# Patient Record
Sex: Male | Born: 1940 | Race: White | Hispanic: No
Health system: Midwestern US, Academic
[De-identification: ages and names within clinical notes are randomized; demographics above are authoritative.]

---

## 2016-05-08 MED ORDER — CARVEDILOL 6.25 MG PO TAB
6.25 mg | ORAL_TABLET | Freq: Two times a day (BID) | ORAL | 3 refills | 90.00000 days | Status: DC
Start: 2016-05-08 — End: 2016-06-14

## 2016-06-14 MED ORDER — FENOFIBRATE MICRONIZED 134 MG PO CAP
ORAL_CAPSULE | Freq: Every day | 3 refills | 30.00000 days | Status: DC
Start: 2016-06-14 — End: 2016-11-20

## 2016-06-14 MED ORDER — CARVEDILOL 12.5 MG PO TAB
12.5 mg | ORAL_TABLET | Freq: Two times a day (BID) | ORAL | 3 refills | 90.00000 days | Status: DC
Start: 2016-06-14 — End: 2016-08-23

## 2016-07-05 ENCOUNTER — Encounter: Admit: 2016-07-05 | Discharge: 2016-07-05 | Payer: MEDICARE

## 2016-07-05 DIAGNOSIS — Z952 Presence of prosthetic heart valve: ICD-10-CM

## 2016-07-05 DIAGNOSIS — I350 Nonrheumatic aortic (valve) stenosis: ICD-10-CM

## 2016-07-18 ENCOUNTER — Encounter: Admit: 2016-07-18 | Discharge: 2016-07-18 | Payer: MEDICARE

## 2016-07-18 ENCOUNTER — Ambulatory Visit: Admit: 2016-07-18 | Discharge: 2016-07-19 | Payer: MEDICARE

## 2016-07-18 DIAGNOSIS — Z720 Tobacco use: ICD-10-CM

## 2016-07-18 DIAGNOSIS — I6529 Occlusion and stenosis of unspecified carotid artery: ICD-10-CM

## 2016-07-18 DIAGNOSIS — J45909 Unspecified asthma, uncomplicated: ICD-10-CM

## 2016-07-18 DIAGNOSIS — R54 Age-related physical debility: ICD-10-CM

## 2016-07-18 DIAGNOSIS — I5023 Acute on chronic systolic (congestive) heart failure: ICD-10-CM

## 2016-07-18 DIAGNOSIS — I10 Essential (primary) hypertension: ICD-10-CM

## 2016-07-18 DIAGNOSIS — I7090 Unspecified atherosclerosis: ICD-10-CM

## 2016-07-18 DIAGNOSIS — N183 Chronic kidney disease, stage 3 (moderate): ICD-10-CM

## 2016-07-18 DIAGNOSIS — Z8679 Personal history of other diseases of the circulatory system: ICD-10-CM

## 2016-07-18 DIAGNOSIS — E785 Hyperlipidemia, unspecified: ICD-10-CM

## 2016-07-18 DIAGNOSIS — M1990 Unspecified osteoarthritis, unspecified site: ICD-10-CM

## 2016-07-18 DIAGNOSIS — I350 Nonrheumatic aortic (valve) stenosis: ICD-10-CM

## 2016-07-18 DIAGNOSIS — E039 Hypothyroidism, unspecified: ICD-10-CM

## 2016-07-18 DIAGNOSIS — K635 Polyp of colon: ICD-10-CM

## 2016-07-18 DIAGNOSIS — Z952 Presence of prosthetic heart valve: ICD-10-CM

## 2016-07-18 DIAGNOSIS — I429 Cardiomyopathy, unspecified: ICD-10-CM

## 2016-07-18 DIAGNOSIS — I2510 Atherosclerotic heart disease of native coronary artery without angina pectoris: ICD-10-CM

## 2016-07-18 MED ORDER — SPIRONOLACTONE 25 MG PO TAB
25 mg | ORAL_TABLET | Freq: Every day | ORAL | 3 refills | 90.00000 days | Status: AC
Start: 2016-07-18 — End: 2016-11-20

## 2016-07-26 ENCOUNTER — Encounter: Admit: 2016-07-26 | Discharge: 2016-07-26 | Payer: MEDICARE

## 2016-07-26 DIAGNOSIS — I10 Essential (primary) hypertension: Principal | ICD-10-CM

## 2016-07-26 LAB — BASIC METABOLIC PANEL
Lab: 1.6 — ABNORMAL HIGH (ref 0.72–1.25)
Lab: 104 % (ref 3–12)
Lab: 138 MMOL/L (ref 21–30)
Lab: 14
Lab: 14 mL/min (ref >60)
Lab: 25 mL/min (ref >60)
Lab: 4.5 U/L (ref 7–56)
Lab: 9.5
Lab: 91

## 2016-08-10 ENCOUNTER — Encounter: Admit: 2016-08-10 | Discharge: 2016-08-10 | Payer: MEDICARE

## 2016-08-10 MED ORDER — HYDRALAZINE 100 MG PO TAB
100 mg | ORAL_TABLET | Freq: Three times a day (TID) | ORAL | 3 refills | Status: SS
Start: 2016-08-10 — End: 2016-12-12

## 2016-08-23 ENCOUNTER — Ambulatory Visit: Admit: 2016-08-23 | Discharge: 2016-08-24 | Payer: MEDICARE

## 2016-08-23 ENCOUNTER — Encounter: Admit: 2016-08-23 | Discharge: 2016-08-23 | Payer: MEDICARE

## 2016-08-23 ENCOUNTER — Ambulatory Visit: Admit: 2016-08-23 | Discharge: 2016-08-23 | Payer: MEDICARE

## 2016-08-23 DIAGNOSIS — I429 Cardiomyopathy, unspecified: ICD-10-CM

## 2016-08-23 DIAGNOSIS — Z952 Presence of prosthetic heart valve: ICD-10-CM

## 2016-08-23 DIAGNOSIS — Z720 Tobacco use: ICD-10-CM

## 2016-08-23 DIAGNOSIS — M1990 Unspecified osteoarthritis, unspecified site: ICD-10-CM

## 2016-08-23 DIAGNOSIS — I10 Essential (primary) hypertension: ICD-10-CM

## 2016-08-23 DIAGNOSIS — I7090 Unspecified atherosclerosis: ICD-10-CM

## 2016-08-23 DIAGNOSIS — Z8679 Personal history of other diseases of the circulatory system: ICD-10-CM

## 2016-08-23 DIAGNOSIS — I350 Nonrheumatic aortic (valve) stenosis: Principal | ICD-10-CM

## 2016-08-23 DIAGNOSIS — I5032 Chronic diastolic (congestive) heart failure: ICD-10-CM

## 2016-08-23 DIAGNOSIS — I2510 Atherosclerotic heart disease of native coronary artery without angina pectoris: ICD-10-CM

## 2016-08-23 DIAGNOSIS — R54 Age-related physical debility: ICD-10-CM

## 2016-08-23 DIAGNOSIS — K635 Polyp of colon: ICD-10-CM

## 2016-08-23 DIAGNOSIS — J45909 Unspecified asthma, uncomplicated: ICD-10-CM

## 2016-08-23 DIAGNOSIS — I5023 Acute on chronic systolic (congestive) heart failure: ICD-10-CM

## 2016-08-23 DIAGNOSIS — E039 Hypothyroidism, unspecified: ICD-10-CM

## 2016-08-23 DIAGNOSIS — I6529 Occlusion and stenosis of unspecified carotid artery: ICD-10-CM

## 2016-08-23 DIAGNOSIS — E785 Hyperlipidemia, unspecified: ICD-10-CM

## 2016-08-23 LAB — CBC
Lab: 12 g/dL — ABNORMAL LOW (ref 13.5–16.5)
Lab: 38 % — ABNORMAL LOW (ref 40–50)
Lab: 4.1 M/UL — ABNORMAL LOW (ref 4.4–5.5)
Lab: 9.4 K/UL (ref 4.5–11.0)

## 2016-08-23 LAB — BNP (B-TYPE NATRIURETIC PEPTI): Lab: 103 pg/mL — ABNORMAL HIGH (ref 0–100)

## 2016-08-23 LAB — BASIC METABOLIC PANEL
Lab: 134 MMOL/L — ABNORMAL LOW (ref 137–147)
Lab: 28 MMOL/L (ref 21–30)
Lab: 4.4 MMOL/L (ref 3.5–5.1)

## 2016-08-23 MED ORDER — CARVEDILOL 25 MG PO TAB
25 mg | ORAL_TABLET | Freq: Two times a day (BID) | ORAL | 3 refills | Status: SS
Start: 2016-08-23 — End: 2016-12-12

## 2016-08-23 NOTE — Progress Notes
Date of Service: 08/23/2016    Javier Gutierrez is a 76 y.o. male.       HPI       I had the pleasure of seeing Javier Gutierrez for 1 year post TAVR follow-up. He is a 76 year old with history of coronary artery disease, diastolic heart failure, hypertension, tobacco use, renal insufficiency, dyslipidemia and aortic stenosis.  He underwent workup for TAVR and on cardiac catheterization he was found to have diffuse nonobstructive coronary disease.  He was admitted in June 2017 and had a 29 Sapien valve placed.  He had no significant issues following the procedure.      He has since been following with Dr. Avie Arenas and Konrad Penta, NP and has had difficulty with blood pressure management.  Several of his medications have been changed.  He was taken off an ARB secondary to an increase in his creatinine.  He is currently on carvedilol 12.5 mg twice a day, hydralazine 100 mg 3 times a day and Spironolactone 25 mg daily.  He is tolerating this regimen without issues.  He states his blood pressure continues to be elevated however.  His blood pressure today in clinic is 180/90 but he did not have his mid day hydralazine.  He is taking all of his medications as prescribed.      Javier Gutierrez did have symptomatic improvement following TAVR.  He denies any chest pain, shortness of breath, palpitations, near-syncope or syncope.  He walks around his building on a daily basis.           Vitals:    08/23/16 1502   BP: 180/90   Pulse: 80   Weight: 66.7 kg (147 lb)   Height: 1.778 m (5' 10)     Body mass index is 21.09 kg/m???.     Past Medical History  Patient Active Problem List    Diagnosis Date Noted   ??? Localized edema 12/13/2015   ??? 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 Chronic diastolic (congestive) heart failure (HCC) 08/02/2015   ??? Stage 3 chronic kidney disease (HCC) 04/05/2015   ??? Loss of appetite 04/05/2015   ??? PAD (peripheral artery disease) (HCC) 04/05/2015   ??? Aortic stenosis 03/11/2015 ??? Weight loss 03/10/2015   ??? Frailty    ??? Tobacco abuse    ??? Acute on chronic systolic heart failure, NYHA class 2 (HCC)    ??? Palpable abdominal aorta 01/25/2015   ??? Systolic murmur of aorta 04/29/2014   ??? Weight loss, unintentional 05/01/2012   ??? 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 Tobacco use disorder 04/28/2010   ??? Bilateral carotid artery disease (HCC) 04/28/2010   ??? Cardiomyopathy (HCC) 11/29/2008     History of cardiomyopathy - left ventricular systolic function has normalized.     ??? History of renal artery stenosis 11/29/2008     A history of renal artery stenosis, status post PTA to left renal artery on 05/2001.     ??? 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 Hypertension 11/29/2008   ??? Hyperlipidemia 11/29/2008   ??? CAD (coronary artery disease) 11/29/2008     03/11/15: Cardiac cath ( via Rt radial approach), 30%pLAD, 40%mLAD, 50-60%mLcx, 30-40%pRCA, 30%mRCA followed by 30% distal  - aggressive medial therapy for CAD.     ??? H/O 11/29/2008         Review of Systems   Constitution: Positive for weight loss.   HENT: Negative.    Eyes:  Negative.    Cardiovascular: Negative.    Respiratory: Negative.    Endocrine: Negative.    Hematologic/Lymphatic: Negative.    Skin: Negative.    Musculoskeletal: Negative.    Gastrointestinal: Negative.    Genitourinary: Negative.    Neurological: Negative.    Psychiatric/Behavioral: Negative.    Allergic/Immunologic: Negative.    All other systems reviewed and are negative.      Physical Exam  General Appearance: no acute distress  Skin: warm & intact  HEENT: unremarkable  Neck Veins: neck veins are flat & not distended  Carotid Arteries: no bruits  Chest Inspection: chest is normal in appearance  Auscultation/Percussion: lungs clear to auscultation, no rales, rhonchi, or wheezing  Cardiac Rhythm: regular rhythm & normal rate  Cardiac Auscultation: Normal S1 & S2, no S3 or S4, no rub  Murmurs: no cardiac murmurs Extremities: no lower extremity edema; 2+ symmetric distal pulses  Abdominal Exam: soft, non-tender, no masses, bowel sounds normal  Liver & Spleen: no organomegaly  Neurologic Exam: oriented to time, place and person; no focal neurologic deficits  Psychiatric: Normal mood and affect.  Behavior is normal. Judgment and thought content normal.         Cardiovascular Studies  Preliminary EKG: NSR, rate 80 bpm.  Incomplete LBBB, LVH    Problems Addressed Today  Encounter Diagnoses   Name Primary?   ??? Nonrheumatic aortic valve stenosis Yes   ??? Essential hypertension    ??? Chronic diastolic (congestive) heart failure (HCC)    ??? S/P TAVR (transcatheter aortic valve replacement)    ??? Coronary artery disease involving native coronary artery of native heart without angina pectoris    ??? Tobacco abuse        Assessment and Plan       1.  Aortic stenosis status post TAVR in July 2017.  He had an echocardiogram today and on preliminary review the valve is well-seated with no regurgitation and a mean gradient of 8 mmHg.  He has had symptomatic improvement since the procedure.  He needs to continue aspirin indefinitely and SBE prophylaxis lifelong.  2.  Hypertension.  His blood pressure is elevated today.  I will increase carvedilol to 25 mg twice a day.  He should continue hydralazine 100 mg 3 times a day and Spironolactone 25 mg daily.  He should continue to check his pressure at home.  He has a follow-up with Dr. Avie Arenas next month.  3.  Chronic diastolic heart failure.  He has no evidence of volume overload currently.  He has NYHA class I heart failure symptoms.  4.  Tobacco use.  Cessation was encouraged.    He should continue to follow with Dr. Avie Arenas routinely and I would recommend an annual echocardiogram.  Thank you for allowing [place] to participate in the care of this pleasant individual.  If you have any other questions or concerns, please do not hesitate to contact us. Current Medications (including today's revisions)  ??? acetaminophen (TYLENOL) 325 mg tablet Take 2 Tabs by mouth every 6 hours as needed for Pain. (Patient taking differently: Take 650 mg by mouth as Needed for Pain.)   ??? aspirin EC 81 mg tablet Take 1 Tab by mouth daily. Take with food.   ??? carvedilol (COREG) 25 mg tablet Take 1 tablet by mouth twice daily. Take with food.   ??? cilostazol(+) (PLETAL) 50 mg tablet Take 50 mg by mouth twice daily. Take on an empty stomach at least 30 minutes before or 2  hours after food.   ??? dutasteride (AVODART) 0.5 mg PO capsule Take 0.5 mg by mouth Daily.   ??? fenofibrate micronized (LOFIBRA) 134 mg capsule TAKE ONE CAPSULE BY MOUTH ONCE DAILY BEFORE BREAKFAST   ??? fish oil- omega 3-DHA/EPA 300/1,000 mg capsule Take 1 Cap by mouth twice daily.   ??? hydrALAZINE (APRESOLINE) 100 mg tablet TAKE 1 TABLET BY MOUTH THREE TIMES DAILY   ??? levothyroxine (SYNTHROID) 100 mcg tablet Take 1 tablet by mouth daily.   ??? montelukast (SINGULAIR) 10 mg tablet TAKE ONE TABLET BY MOUTH ONCE DAILY (Patient taking differently: TAKE ONE TABLET BY MOUTH ONCE DAILY in Morning)   ??? simvastatin (ZOCOR) 80 mg tablet TAKE ONE TABLET BY MOUTH ONCE DAILY AT BEDTIME   ??? spironolactone (ALDACTONE) 25 mg tablet Take 1 tablet by mouth daily. Take with food.   ??? tadalafil (CIALIS) 20 mg tablet Take 20 mg by mouth as Needed for Erectile dysfunction.

## 2016-10-11 ENCOUNTER — Ambulatory Visit: Admit: 2016-10-11 | Discharge: 2016-10-12 | Payer: MEDICARE

## 2016-10-11 ENCOUNTER — Encounter: Admit: 2016-10-11 | Discharge: 2016-10-11 | Payer: MEDICARE

## 2016-10-11 DIAGNOSIS — I739 Peripheral vascular disease, unspecified: ICD-10-CM

## 2016-10-11 DIAGNOSIS — I10 Essential (primary) hypertension: ICD-10-CM

## 2016-10-11 DIAGNOSIS — M1990 Unspecified osteoarthritis, unspecified site: ICD-10-CM

## 2016-10-11 DIAGNOSIS — I6529 Occlusion and stenosis of unspecified carotid artery: ICD-10-CM

## 2016-10-11 DIAGNOSIS — E039 Hypothyroidism, unspecified: ICD-10-CM

## 2016-10-11 DIAGNOSIS — Z952 Presence of prosthetic heart valve: ICD-10-CM

## 2016-10-11 DIAGNOSIS — I350 Nonrheumatic aortic (valve) stenosis: ICD-10-CM

## 2016-10-11 DIAGNOSIS — I6523 Occlusion and stenosis of bilateral carotid arteries: ICD-10-CM

## 2016-10-11 DIAGNOSIS — R54 Age-related physical debility: ICD-10-CM

## 2016-10-11 DIAGNOSIS — I444 Left anterior fascicular block: ICD-10-CM

## 2016-10-11 DIAGNOSIS — I429 Cardiomyopathy, unspecified: ICD-10-CM

## 2016-10-11 DIAGNOSIS — Z8679 Personal history of other diseases of the circulatory system: ICD-10-CM

## 2016-10-11 DIAGNOSIS — K635 Polyp of colon: ICD-10-CM

## 2016-10-11 DIAGNOSIS — J45909 Unspecified asthma, uncomplicated: ICD-10-CM

## 2016-10-11 DIAGNOSIS — I2510 Atherosclerotic heart disease of native coronary artery without angina pectoris: Principal | ICD-10-CM

## 2016-10-11 DIAGNOSIS — I6310 Cerebral infarction due to embolism of unspecified precerebral artery: ICD-10-CM

## 2016-10-11 DIAGNOSIS — I5032 Chronic diastolic (congestive) heart failure: ICD-10-CM

## 2016-10-11 DIAGNOSIS — Z720 Tobacco use: ICD-10-CM

## 2016-10-11 DIAGNOSIS — E785 Hyperlipidemia, unspecified: ICD-10-CM

## 2016-10-11 DIAGNOSIS — R0989 Other specified symptoms and signs involving the circulatory and respiratory systems: ICD-10-CM

## 2016-10-11 DIAGNOSIS — N183 Chronic kidney disease, stage 3 (moderate): ICD-10-CM

## 2016-10-11 DIAGNOSIS — I7090 Unspecified atherosclerosis: ICD-10-CM

## 2016-10-11 DIAGNOSIS — I5023 Acute on chronic systolic (congestive) heart failure: ICD-10-CM

## 2016-10-11 DIAGNOSIS — E7800 Pure hypercholesterolemia, unspecified: ICD-10-CM

## 2016-10-14 ENCOUNTER — Encounter: Admit: 2016-10-14 | Discharge: 2016-10-14 | Payer: MEDICARE

## 2016-10-15 MED ORDER — SIMVASTATIN 80 MG PO TAB
ORAL_TABLET | Freq: Every day | 3 refills | Status: AC
Start: 2016-10-15 — End: 2016-11-20

## 2016-11-20 ENCOUNTER — Encounter: Admit: 2016-11-20 | Discharge: 2016-11-20 | Payer: MEDICARE

## 2016-11-20 ENCOUNTER — Inpatient Hospital Stay: Admit: 2016-11-20 | Discharge: 2016-11-20 | Payer: MEDICARE

## 2016-11-20 ENCOUNTER — Inpatient Hospital Stay: Admit: 2016-12-11 | Discharge: 2016-12-11 | Payer: MEDICARE

## 2016-11-20 DIAGNOSIS — R05 Cough: Secondary | ICD-10-CM

## 2016-11-20 DIAGNOSIS — E785 Hyperlipidemia, unspecified: ICD-10-CM

## 2016-11-20 DIAGNOSIS — Z720 Tobacco use: ICD-10-CM

## 2016-11-20 DIAGNOSIS — I6529 Occlusion and stenosis of unspecified carotid artery: ICD-10-CM

## 2016-11-20 DIAGNOSIS — R54 Age-related physical debility: ICD-10-CM

## 2016-11-20 DIAGNOSIS — Z8679 Personal history of other diseases of the circulatory system: ICD-10-CM

## 2016-11-20 DIAGNOSIS — I350 Nonrheumatic aortic (valve) stenosis: ICD-10-CM

## 2016-11-20 DIAGNOSIS — R918 Other nonspecific abnormal finding of lung field: ICD-10-CM

## 2016-11-20 DIAGNOSIS — J690 Pneumonitis due to inhalation of food and vomit: Secondary | ICD-10-CM

## 2016-11-20 DIAGNOSIS — K635 Polyp of colon: ICD-10-CM

## 2016-11-20 DIAGNOSIS — I5023 Acute on chronic systolic (congestive) heart failure: ICD-10-CM

## 2016-11-20 DIAGNOSIS — I7090 Unspecified atherosclerosis: ICD-10-CM

## 2016-11-20 DIAGNOSIS — J45909 Unspecified asthma, uncomplicated: ICD-10-CM

## 2016-11-20 DIAGNOSIS — E039 Hypothyroidism, unspecified: ICD-10-CM

## 2016-11-20 DIAGNOSIS — J189 Pneumonia, unspecified organism: ICD-10-CM

## 2016-11-20 DIAGNOSIS — I2510 Atherosclerotic heart disease of native coronary artery without angina pectoris: ICD-10-CM

## 2016-11-20 DIAGNOSIS — M1990 Unspecified osteoarthritis, unspecified site: ICD-10-CM

## 2016-11-20 DIAGNOSIS — I10 Essential (primary) hypertension: ICD-10-CM

## 2016-11-20 DIAGNOSIS — I429 Cardiomyopathy, unspecified: ICD-10-CM

## 2016-11-20 LAB — CBC AND DIFF
Lab: 0 10*3/uL (ref 0–0.20)
Lab: 0.1 10*3/uL (ref 0–0.45)
Lab: 18 10*3/uL — ABNORMAL HIGH (ref 4.5–11.0)
Lab: 0 % (ref 0–2)
Lab: 0 10*3/uL (ref 0–0.20)
Lab: 0.1 10*3/uL (ref 0–0.45)
Lab: 0.8 10*3/uL — ABNORMAL LOW (ref 1.0–4.8)
Lab: 1 % (ref 0–5)
Lab: 1.4 10*3/uL — ABNORMAL HIGH (ref 0–0.80)
Lab: 15 % — ABNORMAL HIGH (ref 11–15)
Lab: 15 10*3/uL — ABNORMAL HIGH (ref 1.8–7.0)
Lab: 17 10*3/uL — ABNORMAL HIGH (ref 4.5–11.0)
Lab: 26 % — ABNORMAL LOW (ref 40–50)
Lab: 29 pg (ref 26–34)
Lab: 33 g/dL (ref 32.0–36.0)
Lab: 4 % — ABNORMAL LOW (ref 24–44)
Lab: 523 10*3/uL — ABNORMAL HIGH (ref 150–400)
Lab: 6.6 FL — ABNORMAL LOW (ref 7–11)
Lab: 8 % (ref 4–12)
Lab: 8.8 g/dL — ABNORMAL LOW (ref 13.5–16.5)
Lab: 87 % — ABNORMAL HIGH (ref 41–77)
Lab: 89 FL (ref 80–100)

## 2016-11-20 LAB — COMPREHENSIVE METABOLIC PANEL
Lab: 130 MMOL/L — ABNORMAL LOW (ref 137–147)
Lab: 7 K/UL — ABNORMAL HIGH (ref 3–12)
Lab: >60 mL/min — ABNORMAL HIGH (ref >60)
Lab: >60 mL/min — ABNORMAL LOW (ref >60)

## 2016-11-20 LAB — PLEURAL FLUID GLUCOSE: Lab: 24 mg/dL — ABNORMAL LOW (ref 70–100)

## 2016-11-20 LAB — D-DIMER: Lab: 262 ng{FEU}/mL — ABNORMAL HIGH (ref <500)

## 2016-11-20 LAB — URINALYSIS DIPSTICK
Lab: NEGATIVE MMOL/L (ref 21–30)
Lab: NEGATIVE U/L (ref 7–40)
Lab: NEGATIVE g/dL — ABNORMAL LOW (ref 3.5–5.0)
Lab: NEGATIVE g/dL — ABNORMAL LOW (ref 6.0–8.0)
Lab: NEGATIVE mg/dL (ref 0.3–1.2)
Lab: NEGATIVE mg/dL — ABNORMAL LOW (ref 8.5–10.6)
Lab: POSITIVE U/L — AB (ref 7–56)

## 2016-11-20 LAB — PLEURAL FLUID LIPASE: Lab: 24 U/L

## 2016-11-20 LAB — PLEURAL FLUID PH: Lab: 7.2 — ABNORMAL LOW (ref 7.60–7.66)

## 2016-11-20 LAB — PLEURAL FLUID AMYLASE: Lab: 12 U/L

## 2016-11-20 LAB — POC TROPONIN: Lab: 0 ng/mL (ref 0.00–0.05)

## 2016-11-20 LAB — MAGNESIUM: Lab: 1.8 mg/dL — ABNORMAL HIGH (ref 1.6–2.6)

## 2016-11-20 LAB — URINALYSIS, MICROSCOPIC

## 2016-11-20 LAB — CREATINE KINASE-CPK: Lab: 15 U/L — ABNORMAL LOW (ref 35–232)

## 2016-11-20 LAB — BNP POC ER: Lab: 368 pg/mL — ABNORMAL HIGH (ref 0–100)

## 2016-11-20 LAB — PLEURAL FLUID LACTATE DEHYDROGENASE: Lab: 293 U/L — ABNORMAL HIGH (ref 67–140)

## 2016-11-20 LAB — POC LACTATE: Lab: 0.5 MMOL/L (ref 0.5–2.0)

## 2016-11-20 LAB — PLEURAL FLUID TOTAL BILIRUBIN: Lab: 0.4 mg/dL

## 2016-11-20 LAB — PLEURAL FLUID ALBUMIN

## 2016-11-20 LAB — PLEURAL FLUID TOTAL PROTEIN: Lab: 2.8 g/dL — ABNORMAL HIGH (ref <1.1)

## 2016-11-20 LAB — PHOSPHORUS: Lab: 2.3 mg/dL (ref 2.0–4.5)

## 2016-11-20 LAB — PLEURAL FLUID TRIGLYCERIDES: Lab: 33 mg/dL

## 2016-11-20 LAB — TSH WITH FREE T4 REFLEX: Lab: 0.8 uU/mL — ABNORMAL LOW (ref 0.35–5.00)

## 2016-11-20 LAB — PLEURAL FLUID CHOLESTEROL: Lab: 33 mg/dL (ref <45)

## 2016-11-20 MED ORDER — HYDRALAZINE 100 MG PO TAB
100 mg | Freq: Three times a day (TID) | ORAL | 0 refills | Status: DC
Start: 2016-11-20 — End: 2016-11-22
  Administered 2016-11-20 – 2016-11-22 (×5): 100 mg via ORAL

## 2016-11-20 MED ORDER — CARVEDILOL 25 MG PO TAB
25 mg | Freq: Two times a day (BID) | ORAL | 0 refills | Status: DC
Start: 2016-11-20 — End: 2016-11-22
  Administered 2016-11-20 – 2016-11-22 (×4): 25 mg via ORAL

## 2016-11-20 MED ORDER — LEVOTHYROXINE 100 MCG PO TAB
100 ug | Freq: Every day | ORAL | 0 refills | Status: DC
Start: 2016-11-20 — End: 2016-12-07
  Administered 2016-11-20 – 2016-12-07 (×14): 100 ug via ORAL

## 2016-11-20 MED ORDER — PIPERACILLIN-TAZOBACTAM-DEXTRS 4.5 GRAM/100 ML IV PGBK
4.5 g | INTRAVENOUS | 0 refills | Status: DC
Start: 2016-11-20 — End: 2016-11-20

## 2016-11-20 MED ORDER — MONTELUKAST 10 MG PO TAB
10 mg | Freq: Every day | ORAL | 0 refills | Status: DC
Start: 2016-11-20 — End: 2016-12-07
  Administered 2016-11-20 – 2016-12-07 (×14): 10 mg via ORAL

## 2016-11-20 MED ORDER — ALBUTEROL SULFATE 90 MCG/ACTUATION IN HFAA
2 | RESPIRATORY_TRACT | 0 refills | Status: DC | PRN
Start: 2016-11-20 — End: 2016-12-12

## 2016-11-20 MED ORDER — VANCOMYCIN PHARMACY TO MANAGE
1 | 0 refills | Status: DC
Start: 2016-11-20 — End: 2016-12-06

## 2016-11-20 MED ORDER — ENOXAPARIN 40 MG/0.4 ML SC SYRG
40 mg | Freq: Every day | SUBCUTANEOUS | 0 refills | Status: DC
Start: 2016-11-20 — End: 2016-12-08
  Administered 2016-11-21 – 2016-12-07 (×14): 40 mg via SUBCUTANEOUS

## 2016-11-20 MED ORDER — PIPERACILLIN-TAZOBACTAM-DEXTRS 4.5 GRAM/100 ML IV PGBK
4.5 g | INTRAVENOUS | 0 refills | Status: DC
Start: 2016-11-20 — End: 2016-12-07
  Administered 2016-11-20 – 2016-12-07 (×68): 4.5 g via INTRAVENOUS

## 2016-11-20 MED ORDER — PIPERACILLIN-TAZOBACTAM-DEXTRS 3.375 GRAM/50 ML IV PGBK
3.375 g | Freq: Once | INTRAVENOUS | 0 refills | Status: CP
Start: 2016-11-20 — End: ?
  Administered 2016-11-20: 12:00:00 3.375 g via INTRAVENOUS

## 2016-11-20 MED ORDER — ASPIRIN 81 MG PO TBEC
81 mg | Freq: Every day | ORAL | 0 refills | Status: DC
Start: 2016-11-20 — End: 2016-12-07
  Administered 2016-11-20 – 2016-12-05 (×14): 81 mg via ORAL

## 2016-11-20 MED ORDER — DUTASTERIDE 0.5 MG PO CAP
0.5 mg | Freq: Every day | ORAL | 0 refills | Status: DC
Start: 2016-11-20 — End: 2016-12-07
  Administered 2016-11-20 – 2016-12-05 (×14): 0.5 mg via ORAL

## 2016-11-20 MED ORDER — VANCOMYCIN 1G/250ML D5W IVPB (VIAL2BAG)
15 mg/kg | Freq: Once | INTRAVENOUS | 0 refills | Status: CP
Start: 2016-11-20 — End: ?
  Administered 2016-11-20 (×2): 1000 mg via INTRAVENOUS

## 2016-11-20 MED ORDER — VANCOMYCIN IN DEXTROSE 5 % 750 MG/150 ML IV PGBK
750 mg | Freq: Two times a day (BID) | INTRAVENOUS | 0 refills | Status: DC
Start: 2016-11-20 — End: 2016-11-23
  Administered 2016-11-20 – 2016-11-23 (×6): 750 mg via INTRAVENOUS

## 2016-11-20 NOTE — Patient Education
Medication Education    Javier Gutierrez accepted counseling and was receptive.  he verbalized understanding.    The following medications were discussed:  Zosyn  Vancomycin  Asprin  Coreg  Dutasteride  Singulair  Synthroid  Hydralazine      Where indicated, the patient was provided with additional medication and/or disease-state information.  All patient questions were answered and patient acknowledged understanding of the medications, side effects and other pertinent medication information.    Follow up should occur daily.    Continue to address: indications    Gwynn Burly, RN

## 2016-11-20 NOTE — ED Notes
Urine specimen sent to lab

## 2016-11-20 NOTE — Progress Notes
.  Patient arrived to room # (6202*) via bed accompanied by transport. Patient transferred to the bed with assistance. Bedside safety checks completed. Initial patient assessment completed, refer to flowsheet for details. Admission skin assessment completed by:    RN & Lequita Halt RN    Pressure Injury Present on Hospital Admission (within 24 hours): Yes    1. Occiput: No  2. Ear: No  3. Scapula: No  4. Spinous Process: No  5. Shoulder: No  6. Elbow: No  7. Iliac Crest: No  8. Sacrum/Coccyx: Yes  9. Ischial Tuberosity: No  10. Trochanter: No  11. Knee: No  12. Malleolus: No  13. Heel: No  14. Toes: No  15. Assessed for device associated injury Yes  16. Nursing Nutrition Assessment Completed Yes    See Doc Flowsheet for additional wound details.     INTERVENTIONS:

## 2016-11-20 NOTE — Other
Procedure Note    Javier Gutierrez is a 76 y.o. male.      Chest Tube  Date/Time: 11/20/2016 4:52 PM  Performed by: Felipa Eth  Authorized by: Myrtie Soman   Consent: Verbal consent obtained. Written consent obtained.  Risks and benefits: risks, benefits and alternatives were discussed  Consent given by: patient and power of attorney  Patient understanding: patient states understanding of the procedure being performed  Patient consent: the patient's understanding of the procedure matches consent given  Procedure consent: procedure consent matches procedure scheduled  Relevant documents: relevant documents present and verified  Test results: test results available and properly labeled  Site marked: the operative site was marked  Imaging studies: imaging studies available  Required items: required blood products, implants, devices, and special equipment available  Patient identity confirmed: verbally with patient  Time out: Immediately prior to procedure a "time out" was called to verify the correct patient, procedure, equipment, support staff and site/side marked as required.  Indications: pleural effusion    Sedation:  Patient sedated: no  Anesthesia: local infiltration    Anesthesia:  Local Anesthetic: lidocaine 1% without epinephrine  Anesthetic total: 10 mL  Preparation: skin prepped with Chloraprep  Placement location: left lateral  Scalpel size: 11  Tube size: 16 French  Ultrasound guidance: yes  Tension pneumothorax heard: no  Tube connected to: water seal  Drainage characteristics: cloudy  Drainage amount: 200 ml  Suture material: 2-0 silk  Dressing: 4x4 sterile gauze  Post-insertion x-ray comments: ordered  Patient tolerance: Patient tolerated the procedure well with no immediate complications               L. Alfonso Ellis

## 2016-11-20 NOTE — Case Management (ED)
Case Management Admission Assessment    NAME:Javier Gutierrez                          MRN: [id number]             DOB:Jun 21, 1940          AGE: 76 y.o.  ADMISSION DATE: 11/20/2016             DAYS ADMITTED: LOS: 0 days      Today???s Date: 11/20/2016    Source of Information: patient and his daughter       Plan  Plan: CM Assessment, Assist PRN with SW/NCM Services    Spoke with patient at the bedside, introduced self and explained the role of NCM    Unsure of patient needs at discharge at this time. Primary case management team to continue to follow up with the patient as further needs arise during hospitalization.    Patient Address/Phone  [address]  [address]  [place] [address]  [phone] (home)     Emergency Contact  Extended Emergency Contact Information  Primary Emergency Contact: Harada,[name]  Address: [address]           [place], [place] [address] [employer]  Home Phone: [phone]  Mobile Phone: [phone]  Relation: Daughter  Secondary Emergency Contact: Alben Sp[place]  Home Phone: [phone]  Mobile Phone: [phone]  Relation: Son    Network engineer Directive: Yes, patient has a healthcare directive  Type of Healthcare Directive: Durable power of attorney for healthcare  Location of Healthcare Directive: Current and verified in document scanning system  Would patient like to fill out a (a new) Editor, commissioning?: No, patient declined      Transportation  Does the patient need discharge transport arranged?: No  Transportation Name, Phone and Availability #1: [name] Robit[place]--daughter--[phone]  Does the patient use Medicaid Transportation?: No    Expected Discharge Date       Living Situation Prior to Admission  ? Living Arrangements  Type of Residence: Independent living facility  Living Arrangements: Alone  Financial risk analyst / Tub: Tub/Shower Unit  How many levels in the residence?: 1  Can patient live on one level if needed?: Yes Does residence have entry and/or side stairs?: No  Assistance needed prior to admit or anticipated on discharge: No  Who provides assistance or could if needed?: [name]--daughter  Are they in good health?: Yes  Can support system provide 24/7 care if needed?: No  ? Level of Function   Prior level of function: Independent  ? Cognitive Abilities   Cognitive Abilities: Alert and Oriented    Financial Resources  ? Coverage  Primary Insurance: Medicare  Additional Coverage: RX (Patient states he has [employer] for drug coverage. No concerns with medication costs at this time.)      Patient's preferred pharmacy is the [facility] in [place], [place].    ? Source of Income   Source Of Income: Other retirement income  ? Financial Assistance Needed?  No    Psychosocial Needs  ? Mental Health  Mental Health History: No  ? Substance Use History  Substance Use History Screen: Yes  Comment: Patient indicates that he smokes 12-13 cigarettes per day. Patient denies drinking alcohol  ? Other  N/A    Current/Previous Services  ? PCP  Steva Ready, [phone], [phone]  ? Pharmacy    [facility] [address] [place] - [phone]  [place] [address] [place] [address]  [place] [address]  Phone: [phone] Fax: [phone]    ? Durable Medical Equipment   Durable Medical Equipment at home: [employer], Grab bars, Toilet riser  ? Home Health  Receiving home health: No  ? Hemodialysis or Peritoneal Dialysis  Undergoing hemodialysis or peritoneal dialysis: No  ? Tube/Enteral Feeds  Receive tube/enteral feeds: No  ? Infusion  Receive infusions: No  ? Private Duty  Private duty help used: No  ? Home and Community Based Services  Home and community based services: No  ? [name]  [name]: No  ? Hospice  Hospice: No  ? Outpatient Therapy  PT: In the past  When did patient receive care?: July 2017--cardiac rehab  Name of rehab location/group: [facility]  OT: No  SLP: No  ? Skilled Nursing Facility/Nursing Home  SNF: No  NH: No ? Inpatient Rehab  IPR: No  ? Long-Term Acute Care Hospital  LTACH: No  ? Acute Hospital Stay  Acute Hospital Stay: In the past  Was patient's stay within the last 30 days?: No  When did patient receive care?: July 2017  Name of hospital: [facility] [place], [place]  Pager: [phone]  Office: [phone]

## 2016-11-20 NOTE — H&P (View-Only)
Admission History and Physical Examination      Name:  Javier Gutierrez                                             MRN:  [id number]   Admission Date:  11/20/2016                     Assessment/Plan:    Active Problems:    Pneumonia      76 year old man with history of hypertension, hyperlipidemia, coronary artery disease, aortic stenosis, chronic kidney disease, peripheral artery disease, and lifetime smoker who presents with concern for weakness and cough.  Per daughter he is reported to have a fall one week ago where he was down for 15 hours and generalized worsening fatigue over the last few weeks.    Suspected post obstructive pneumonia  -Reported coughing and weakness over the last few weeks and weight loss over the last year  -CT Chest showed    Consolidation of the majority of the left mid and lower lung, most   compatible with pneumonia and partially loculated pleural effusion.   Underlying neoplastic endobronchial lesion possible, recommend follow-up   CT or PET/CT after appropriate medical therapy. ???Secretions within the   trachea, suspicious for aspiration.    2. Moderate sized partially loculated loculated left pleural effusion with   areas of probable pleural thickening, may be inflammatory though   metastatic pleural disease could appear similar    3. Mild mediastinal and probable hilar adenopathy.     4. Calcific coronary artery disease.    CAD  -Continue PTA Aspirin/COreg    CT head negative and CT C spine was suspicious for possible lytic lesions to the c spine  Plan  >Consult pulm for possible bronch and further evaluation for suspicious mass   >Continue Vanc and Zosyn started in the ED  >Blood cultures sent will follow-up     Recent Mechanical fall  -CT head negative  >Consult PT/OT for eval      HLD/HTN  -He is of his simvastatin and fenofibrate due to reported muscle weakness since last Thursday  -Continue PTA coreg and hydralazine    Hypothyroidism -Check TSH/Free T$  -Continue PTA synthroid    Aortic Valve stenosis S/P TAVR in 2017    Chronic Tobacco abuse  -Has not smoked over the past week since he has felt week.  -Counseled and discussed smoking cessation with family    FEN:  NPO for now in case pulm plan to do bronchoscopy today  -Monitor and replace lytes  -Consult nutrition due to overall poor intake over the last year    DVT ppx: Lovenox, SCD's  DNAR-FI discussed with patietn  Admit to inpatient      __________________________________________________________________________________  Primary Care Physician: Steva Ready  Verified    Chief Complaint:  Fatigue, dyspnea, cough and recent fall  History of Present Illness: Javier Gutierrez is a 76 y.o. male with history of hypertension, hyperlipidemia, coronary artery disease, aortic stenosis, chronic kidney disease, peripheral artery disease, and lifetime smoker who presents with concern for weakness and cough.  Per daughter he is reported to have a fall one week ago where he was down for 15 hours and generalized worsening fatigue over the last few weeks. History primarily gathered from daughter because patient had a difficult time recalling medical history.  He has been living in an independent senior living facility and able to do all ADL without issues until a few weeks ago during which time has had been experiencing increasing fatigue and dyspnea.  He denies fevers, chills, nausea, emesis or changes of bowel and bladder.    Past Medical History:   Diagnosis Date   ??? Acute on chronic systolic heart failure, NYHA class 2 (HCC)    ??? Aortic valve stenosis, mild 11/29/2008   ??? Arterial occlusion     left kidney-two stents placed   ??? Arthritis     lower back   ??? Asthma    ??? CAD (coronary artery disease) 11/29/2008   ??? Cardiomyopathy (HCC) 11/29/2008   ??? Carotid artery plaque    ??? Colon polyps    ??? Frailty    ??? 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 HTN ??? Hyperlipidemia    ??? Hyperlipidemia 11/29/2008   ??? Hypertension 11/29/2008   ??? Hypothyroidism    ??? Tobacco abuse      Past Surgical History:   Procedure Laterality Date   ??? HX LUMBAR DISKECTOMY  1968    30 years ago   ??? STENT INTRAVASCULAR  2006    kidney stent placed   ??? PERCUTANEOUS CORONARY INTERVENTION N/A 03/11/2015    Possible Percutaneous Coronary Intervention performed by Marcell Barlow, MD, [facility] at CATH LAB   ??? UPPER GASTROINTESTINAL ENDOSCOPY N/A 04/26/2015    ESOPHAGOGASTRODUODENOSCOPY performed by Tempie Hoist, DO at ENDO/GI   ??? UPPER GASTROINTESTINAL ENDOSCOPY  04/26/2015    ESOPHAGOGASTRODUODENOSCOPY BIOPSY performed by Tempie Hoist, DO at ENDO/GI   ??? AORTIC VALVE REPLACEMENT N/A 08/03/2015    REPLACEMENT TRANSCATHETER AORTIC VALVE (Sapien 26s3), right common femoral artery approach performed by Zella Richer, MD at [facility]     Family history reviewed; non-contributory  Social History     Social History   ??? Marital status: Widowed     Spouse name: N/A   ??? Number of children: N/A   ??? Years of education: N/A     Social History Main Topics   ??? Smoking status: Current Every Day Smoker     Packs/day: 0.50     Years: 60.00     Types: Cigarettes   ??? Smokeless tobacco: Never Used      Comment: 0.5-2 ppd history   ??? Alcohol use No   ??? Drug use: No   ??? Sexual activity: Not on file     Other Topics Concern   ??? Not on file     Social History Narrative   ??? No narrative on file      Immunizations (includes history and patient reported):   There is no immunization history on file for this patient.        Allergies:  Sulfa (sulfonamide antibiotics)    Medications:  No current facility-administered medications for this encounter.      Current Outpatient Prescriptions   Medication Sig   ??? acetaminophen (TYLENOL) 325 mg tablet Take 2 Tabs by mouth every 6 hours as needed for Pain. (Patient taking differently: Take 650 mg by mouth as Needed for Pain.) ??? aspirin EC 81 mg tablet Take 1 Tab by mouth daily. Take with food.   ??? carvedilol (COREG) 25 mg tablet Take 1 tablet by mouth twice daily. Take with food.   ??? cilostazol(+) (PLETAL) 50 mg tablet Take 50 mg by mouth twice daily.  Take on an empty stomach at least 30 minutes before or 2 hours after food.   ??? dutasteride (AVODART) 0.5 mg PO capsule Take 0.5 mg by mouth Daily.   ??? fenofibrate micronized (LOFIBRA) 134 mg capsule TAKE ONE CAPSULE BY MOUTH ONCE DAILY BEFORE BREAKFAST   ??? fish oil- omega 3-DHA/EPA 300/1,000 mg capsule Take 1 Cap by mouth twice daily.   ??? hydrALAZINE (APRESOLINE) 100 mg tablet TAKE 1 TABLET BY MOUTH THREE TIMES DAILY   ??? levothyroxine (SYNTHROID) 100 mcg tablet Take 1 tablet by mouth daily.   ??? montelukast (SINGULAIR) 10 mg tablet TAKE ONE TABLET BY MOUTH ONCE DAILY (Patient taking differently: TAKE ONE TABLET BY MOUTH ONCE DAILY in Morning)   ??? simvastatin (ZOCOR) 80 mg tablet TAKE 1 TABLET BY MOUTH ONCE DAILY AT BEDTIME   ??? spironolactone (ALDACTONE) 25 mg tablet Take 1 tablet by mouth daily. Take with food.   ??? tadalafil (CIALIS) 20 mg tablet Take 20 mg by mouth as Needed for Erectile dysfunction.     Review of Systems:  Rest of 14 ROS is negative other than what is stated in HPI    Physical Exam:  Vital Signs: Last Filed In 24 Hours Vital Signs: 24 Hour Range   BP: 119/56 (10/30 0800)  Temp: 37.1 ???C (98.8 ???F) (10/30 0518)  Pulse: 75 (10/30 0800)  Respirations: 18 PER MINUTE (10/30 0800)  SpO2: 95 % (10/30 0730)  O2 Delivery: None (Room Air) (10/30 0518)  SpO2 Pulse: 76 (10/30 0800)  Height: 177.8 cm (70) (10/30 0518) BP: (119-146)/(55-66)   Temp:  [37.1 ???C (98.8 ???F)]   Pulse:  [74-84]   Respirations:  [15 PER MINUTE-22 PER MINUTE]   SpO2:  [91 %-95 %]   O2 Delivery: None (Room Air)          General:  Alert, cooperative, no distress, appears stated age  Head:  Normocephalic, without obvious abnormality, atraumatic  Eyes:  Conjunctivae/corneas clear.  PERRL, EOMs intact.  Fundi benign Throat:  Lips, mucosa and tongue normal.  Edentulous with dentures in place  Neck:  Supple, symmetrical, trachea midline, no adenopathy, thyroid: no enlargement/tenderness/nodules, no carotid bruit and no JVD  Lungs:  Scattered Rhonchi  Heart:    Regular rate and rhythm, S1, S2 normal, no murmur, click rub or gallop  Abdomen:  Soft, non-tender.  Bowel sounds normal.  No masses.  No organomegaly.  Extremities: + 1 pitting edema in B/L lower Extremities   Peripheral pulses:   2+ and symmetric, all extremities      Lab/Radiology/Other Diagnostic Tests:  24-hour labs:    Results for orders placed or performed during the hospital encounter of 11/20/16 (from the past 24 hour(s))   CBC AND DIFF    Collection Time: 11/20/16  5:22 AM   Result Value Ref Range    White Blood Cells 18.4 (H) 4.5 - 11.0 K/UL    RBC 3.11 (L) 4.4 - 5.5 M/UL    Hemoglobin 9.4 (L) 13.5 - 16.5 GM/DL    Hematocrit 13.0 (L) 40 - 50 %    MCV 88.4 80 - 100 FL    MCH 30.1 26 - 34 PG    MCHC 34.1 32.0 - 36.0 G/DL    RDW 86.5 (H) 11 - 15 %    Platelet Count 554 (H) 150 - 400 K/UL    MPV 7.0 7 - 11 FL    Neutrophils 85 (H) 41 - 77 %    Lymphocytes 5 (L) 24 -  44 %    Monocytes 9 4 - 12 %    Eosinophils 1 0 - 5 %    Basophils 0 0 - 2 %    Absolute Neutrophil Count 15.60 (H) 1.8 - 7.0 K/UL    Absolute Lymph Count 0.90 (L) 1.0 - 4.8 K/UL    Absolute Monocyte Count 1.70 (H) 0 - 0.80 K/UL    Absolute Eosinophil Count 0.10 0 - 0.45 K/UL    Absolute Basophil Count 0.00 0 - 0.20 K/UL   COMPREHENSIVE METABOLIC PANEL    Collection Time: 11/20/16  5:22 AM   Result Value Ref Range    Sodium 130 (L) 137 - 147 MMOL/L    Potassium 4.2 3.5 - 5.1 MMOL/L    Chloride 101 98 - 110 MMOL/L    Glucose 101 (H) 70 - 100 MG/DL    Blood Urea Nitrogen 25 7 - 25 MG/DL    Creatinine 1.61 0.4 - 1.24 MG/DL    Calcium 8.1 (L) 8.5 - 10.6 MG/DL    Total Protein 5.4 (L) 6.0 - 8.0 G/DL    Total Bilirubin 0.5 0.3 - 1.2 MG/DL    Albumin 2.2 (L) 3.5 - 5.0 G/DL    Alk Phosphatase 42 25 - 110 U/L AST (SGOT) 19 7 - 40 U/L    CO2 22 21 - 30 MMOL/L    ALT (SGPT) 9 7 - 56 U/L    Anion Gap 7 3 - 12    eGFR Non African American >60 >60 mL/min    eGFR African American >60 >60 mL/min   MAGNESIUM    Collection Time: 11/20/16  5:22 AM   Result Value Ref Range    Magnesium 1.8 1.6 - 2.6 mg/dL   PHOSPHORUS    Collection Time: 11/20/16  5:22 AM   Result Value Ref Range    Phosphorus 2.3 2.0 - 4.5 MG/DL   CREATINE KINASE-CPK    Collection Time: 11/20/16  5:22 AM   Result Value Ref Range    Creatine Kinase 15 (L) 35 - 232 U/L   D-DIMER    Collection Time: 11/20/16  5:22 AM   Result Value Ref Range    D-Dimer 2,623 (H) <500 ng/mL FEU   POC TROPONIN    Collection Time: 11/20/16  5:39 AM   Result Value Ref Range    Troponin-I-POC 0.00 0.00 - 0.05 NG/ML   BNP POC ER    Collection Time: 11/20/16  5:40 AM   Result Value Ref Range    BNP POC 368.0 (H) 0 - 100 PG/ML   CULTURE-BLOOD W/SENSITIVITY    Collection Time: 11/20/16  6:20 AM   Result Value Ref Range    Battery Name BLOOD CULTURE     Specimen Description BLOOD  RIGHT  WRIST       Special Requests NONE     Culture      Report Status     POC LACTATE    Collection Time: 11/20/16  6:37 AM   Result Value Ref Range    LACTIC ACID POC 0.5 0.5 - 2.0 MMOL/L     Glucose: (!) 101 (11/20/16 0522)  Pertinent radiology reviewed.    Iley Deignan, DO  Pager [phone]

## 2016-11-20 NOTE — ED Notes
1107: [id number] READY. Please call Morrie Sheldon @ [phone] for report.

## 2016-11-20 NOTE — ED Notes
[id number] CLEANING. Please call Morrie Sheldon @ [phone] for report.

## 2016-11-20 NOTE — Progress Notes
Pharmacy Vancomycin Note  Subjective:   Javier Gutierrez is a 76 y.o. male being treated for post-obstructive pneumonia.    Objective:     Current Vancomycin Orders   Medication Dose Route Frequency    vancomycin  (VANCOCIN)  750 mg in D5W IVPB (premade)  750 mg Intravenous Q12H*    And    vancomycin, pharmacy to manage  1 each Service Per Pharmacy     Start Date of  Vancomycin therapy: 11/20/2016  Additional Abx: [id number]    White Blood Cells   Date/Time Value Ref Range Status   11/20/2016 0522 18.4 (H) 4.5 - 11.0 K/UL Final     Creatinine   Date/Time Value Ref Range Status   11/20/2016 0522 0.97 0.4 - 1.24 MG/DL Final     Blood Urea Nitrogen   Date/Time Value Ref Range Status   11/20/2016 0522 25 7 - 25 MG/DL Final     Estimated CrCl: ~62 mL/min  Actual Weight:  68 kg (150 lb)    Assessment:   Target levels for this patient: trough ~15 mcg/mL.    Plan:   1. Pt received vancomycin 1 gm x1 10/30 @0700 .  Will continue 750 mg (~11 mg/kg) q12h to start 10/30 PM.  2. Next scheduled level(s): prior to 4th dose (not ordered yet)  3. Pharmacy will continue to monitor and adjust therapy as needed.    Jefm Miles, [facility]  11/20/2016

## 2016-11-20 NOTE — Progress Notes
Pt A&Ox4. Patient allergy band in place.Pt ID band verified with patient.  Profile completed, Fall risk in place, care plan/education updated,  call light within reach

## 2016-11-21 LAB — BASIC METABOLIC PANEL: Lab: 130 MMOL/L — ABNORMAL LOW (ref 137–147)

## 2016-11-21 LAB — GRAM STAIN

## 2016-11-21 LAB — CULTURE-URINE W/SENSITIVITY: Lab: <10

## 2016-11-21 LAB — CBC AND DIFF
Lab: 0 % (ref 0–2)
Lab: 0 % (ref 0–5)
Lab: 0 10*3/uL (ref 0–0.20)
Lab: 0.1 10*3/uL (ref 0–0.45)
Lab: 0.8 10*3/uL — ABNORMAL LOW (ref 1.0–4.8)
Lab: 1.4 10*3/uL — ABNORMAL HIGH (ref 0–0.80)
Lab: 13 10*3/uL — ABNORMAL HIGH (ref 1.8–7.0)
Lab: 15 10*3/uL — ABNORMAL HIGH (ref 4.5–11.0)
Lab: 2.8 M/UL — ABNORMAL LOW (ref 4.4–5.5)
Lab: 25 % — ABNORMAL LOW (ref 40–50)
Lab: 29 pg (ref 26–34)
Lab: 5 % — ABNORMAL LOW (ref 24–44)
Lab: 8.4 g/dL — ABNORMAL LOW (ref 13.5–16.5)
Lab: 9 % (ref 4–12)
Lab: 90 FL (ref 80–100)

## 2016-11-21 LAB — LDH-LACTATE DEHYDROGENASE: Lab: 297 U/L — ABNORMAL HIGH (ref 100–210)

## 2016-11-21 LAB — VANCOMYCIN TROUGH: Lab: 9.1 ug/mL — ABNORMAL LOW (ref 10.0–20.0)

## 2016-11-21 LAB — CELL COUNT W/DIFF-FLUIDS: Lab: 140 /uL

## 2016-11-21 MED ORDER — BARIUM SULFATE 40 % (W/V) 29% (W/W) PO SUSP
10 mL | Freq: Once | ORAL | 0 refills | Status: CP
Start: 2016-11-21 — End: ?
  Administered 2016-11-21: 19:00:00 10 mL via ORAL

## 2016-11-21 MED ORDER — BARIUM SULFATE 40 % (W/V) PO SUSP
10 mL | Freq: Once | ORAL | 0 refills | Status: CP
Start: 2016-11-21 — End: ?
  Administered 2016-11-21: 19:00:00 10 mL via ORAL

## 2016-11-21 MED ORDER — BARIUM SULFATE 40 % (W/V), 30% (W/W) PO PSTE
10 mL | Freq: Once | ORAL | 0 refills | Status: CP
Start: 2016-11-21 — End: ?
  Administered 2016-11-21: 19:00:00 10 mL via ORAL

## 2016-11-21 MED ORDER — LACTATED RINGERS IV SOLP
500 mL | INTRAVENOUS | 0 refills | Status: CP
Start: 2016-11-21 — End: ?
  Administered 2016-11-22: 04:00:00 500 mL via INTRAVENOUS

## 2016-11-21 MED ORDER — ALTEPLASE 10 MG/SODIUM CHLORIDE 0.9% 30ML SYRINGE
10 mg | Freq: Two times a day (BID) | INTRAPLEURAL | 0 refills | Status: CP
Start: 2016-11-21 — End: ?
  Administered 2016-11-21 – 2016-11-24 (×11): 10 mg via INTRAPLEURAL

## 2016-11-21 MED ORDER — BARIUM SULFATE 40 % (W/V) PO POWD
10 mL | Freq: Once | ORAL | 0 refills | Status: CP
Start: 2016-11-21 — End: ?
  Administered 2016-11-21: 19:00:00 10 mL via ORAL

## 2016-11-21 MED ORDER — DORNASE ALPHA 5 MG/STERILE WATER 30ML SYRINGE
5 mg | Freq: Two times a day (BID) | INTRAPLEURAL | 0 refills | Status: CP
Start: 2016-11-21 — End: ?
  Administered 2016-11-21 – 2016-11-24 (×12): 5 mg via INTRAPLEURAL

## 2016-11-21 MED ORDER — CILOSTAZOL 100 MG PO TAB
50 mg | Freq: Two times a day (BID) | ORAL | 0 refills | Status: DC
Start: 2016-11-21 — End: 2016-12-07
  Administered 2016-11-21 – 2016-12-07 (×27): 50 mg via ORAL

## 2016-11-21 NOTE — Progress Notes
PHYSICAL THERAPY  ASSESSMENT     MOBILITY:  Mobility  Progressive Mobility Level: Active transfer to chair  Level of Assistance: Assist X1  Assistive Device: Hand Held  Time Tolerated: 0-10 minutes  Activity Limited By: Fatigue;Weakness    SUBJECTIVE:  Subjective  Significant hospital events: PMH significant for HTN, HLD, CAD, aortic stenosis s/p TAVR, CKD, PAD, lifetime smoker, chronic diastolic heart failure. Admitted 11/20/16 with weakness and cough. Found atelectasis and L PE with chest tube placed.   Mental / Cognitive Status: Alert;Cooperative;Follows Commands  Persons Present: Daughter  Pain: Patient has no complaint of pain  Pain Interventions: Patient agrees to participate in therapy  Comments: Patient on 2.5L 02 via NC and chest tube in place to water-seal.  Ambulation Assist: Independent Mobility in Community with Device  Patient Owned Equipment: Nurse, adult  Home Situation: Lives Alone  Type of Home: Apartment (independent living)  Entry Stairs: No Stairs  In-Home Stairs: No Stairs  Comments: Patient defers prior level of function information to be obtained from daughter. Daughter reports patient was living at home alone in an independent living facility where he was fully indepenent up until about a week ago when he experienced a fall which resulted in patient being on the ground for an estimated 15 hours. Since then has used a roller walker for mobility. Daughter reports progressive weakness for about the past week leading up to hospitalization. No oxygen used at baseline.    ROM:  ROM  UE ROM: WFL  LE ROM: WFL    STRENGTH:  Strength  Overall Strength: Generalized Weakness;Able to Move All Joints Independently Through Available ROM  Gross Strength Grade: 4/5    POSTURE/NEURO:  Posture / Neurological  Posture: Rounded Shoulders;Forward Head  Overall Tone: Normal  Overall Sensation/Proprioception: No Deficits Noted    BED MOBILITY/TRANSFERS:  Bed Mobility/Transfers Bed Mobility: Supine to Sit: Minimal Assist;Head of Bed Elevated;Use of Rail;Safety Considerations;Requires Extra Time  Comments: Able to scoot forward on edge of bed with standby assist and cues for safety.  Transfer Type: Sit to Stand  Transfer: Assistance Level: From;Bed;Minimal Assist  Transfer: Assistive Device: Chief of Staff Assist  Transfers: Type Of Assistance: Verbal Cues;Elevated Bed;For Balance;For Strength Deficit;For Safety Considerations  Other Transfer Type: Stand Pivot  Other Transfer: Assistance Level: To;Bed Side Chair  Other Transfer: Assistive Device: Hand Hold Assist  Other Transfer: Type Of Assistance: Verbal Cues;For Strength Deficit;For Safety Considerations;For Balance  End Of Activity Status: Up in Chair;Nursing Notified;Instructed Patient to Request Assist with Mobility;Instructed Patient to Use Call Light    BALANCE:  Balance  Sitting Balance: Dynamic Sitting Balance;Static Sitting Balance;2 UE Support;Standby Assist  Standing Balance: Static Standing Balance;Dynamic Standing Balance;2 UE support;Minimal Assist    GAIT:  Gait  Gait Distance: 5 feet (side-step up to chair)  Gait: Assistance Level: Minimal Assist  Gait: Assistive Device: Hand Hold Assist  Gait: Descriptors:  (slow, steady side-stepping up to chair. )  Activity Limited By: Complaint of Fatigue    EDUCATION:  Education  Persons Educated: Patient  Interventions: Repetition of Instructions;Family Education  Teaching Methods: Verbal Instruction  Patient Response: Verbalized Understanding;More Instruction Required  Topics: Plan/Goals of PT Interventions;Use of Assistive Device/Orthosis;Mobility Progression;Safety Awareness;Up with Assist Only;Importance of Increasing Activity    ASSESSMENT/PROGRESS:  Assessment/Progress  Impaired Mobility Due To: Decreased Strength;Impaired Balance;Decreased Activity Tolerance;Deconditioning  Assessment/Progress: Should Improve w/ Continued PT Comments: Patient tolerated mobility well this morning, however demonstrates increased deconditioning from baseline with increased assistance needed for safety.  AM-PAC 6 Clicks  Basic Mobility Inpatient  Turning from your back to your side while in a flat bed without using bed rails: A Little  Moving from lying on your back to sitting on the side of a flatbed without using bedrails : A Little  Moving to and from a bed to a chair (including a wheelchair): A Little  Standing up from a chair using your arms (e.g. wheelchair, or bedside chair): A Little  To walk in hospital room: A Lot  Climbing 3-5 steps with a railing: Total  Raw Score: 15  Standardized (T-scale) Score: 36.97  Basic Mobility CMS 0-100%: 50.4  CMS G Code Modifier for Basic Mobility: CK     G-Codes: Mobility  [id number] Current Status:  40-59% Impairment  G8979 Goal Status: 0% Impairment     Based on above evaluation and clinical judgment.    GOALS:  Goals  Goal Formulation: With Patient/Family  Time For Goal Achievement: 5 days  Pt Will Go Supine To/From Sit: w/ Stand By Assist  Pt Will Transfer Bed/Chair: w/ Stand By Assist  Pt Will Transfer Sit to Stand: w/ Stand By Assist  Pt Will Ambulate: Greater than 200 Feet, w/ Dan Humphreys, w/ Stand By Assist    PLAN:  Plan   Treatment Interventions: Mobility Training;Strengthening;Balance Activities;Endurance Training  Plan Frequency: 5 Days per Week  PT Plan for Next Visit: Increase gait with roller walker.    RECOMMENDATIONS:  PT Discharge Recommendations  PT Discharge Recommendations: Inpatient Setting  Equipment Recommendations: Too early to be determined  Comments: Patient may benefit from short rehab stay to improve functional mobility, safety and independence prior to returning to independent living facility. Will continue to update recommendation as mobility progresses.  Recommend ongoing assistance for: Transfers;Bed mobility;Ambulation;Stairs;Safety concerns;In and out of house Therapist: Versie Starks, [place], [place] [address]  Date: 11/21/2016

## 2016-11-21 NOTE — Progress Notes
Daily Progress Note      Assessment/Plan     SAMMEY FREE is a 76 y.o. male  Admission Date: 11/20/2016  LOS: 1    Principal Problem:    Parapneumonic effusion  Active Problems:    Tobacco use disorder    CAD (coronary artery disease)    Chronic diastolic (congestive) heart failure [facility])    Pneumonia      Hospital Course:       Active Problem List  76 year old man with history of hypertension, hyperlipidemia, coronary artery disease, aortic stenosis, chronic kidney disease, peripheral artery disease, and lifetime smoker who presents with concern for weakness and cough.  Per daughter he is reported to have a fall one week ago where he was down for 15 hours and generalized worsening fatigue over the last few weeks.  ???  Complicated parapneumonic effusion  -LDH 293, white count 1400, pH of pleural fluid 7.29.  -Reported coughing and weakness over the last few weeks and weight loss over the last year  -CT Chest showed consolidation of the left mid and lower lung.  Patient had chest tube placed on 10/30.  > Continue vancomycin and Zosyn.  Continue chest tube. Underlying neoplastic endobronchial lesion possible, recommend follow-up CT or PET/CT after appropriate medical therapy.    CAD  -Continue PTA Aspirin/COreg  ???  CT head negative and CT C spine was suspicious for possible lytic lesions to the c spine  Plan  >Consult pulm for possible bronch and further evaluation for suspicious mass   >Continue Vanc and Zosyn started in the ED  >Blood cultures sent will follow-up   ???  Recent Mechanical fall  -CT head negative  >Consult PT/OT for eval  ???  HLD/HTN  -He is of his simvastatin and fenofibrate due to reported muscle weakness since last Thursday  -Continue PTA coreg and hydralazine  ???  Hypothyroidism  -Check TSH/Free T4  -Continue PTA synthroid  ???  Aortic Valve stenosis S/P TAVR in 2017  ???  Chronic Tobacco abuse  -Has not smoked over the past week since he has felt week. -Counseled and discussed smoking cessation with family    PCP: Steva Ready, Phone: [phone], Fax: [phone]  Consultants:  FEN: IVF, electrolytes stable, DIET NPO   PPX: Lovenox  Code Status: DNAR-Full Intervention    Dispo: Continue admission for completed parapneumonic effusion      Candelaria Celeste, MD  Internal Medicine  [facility], [phone]      Subjective   This is a 76 y.o. male admitted for Parapneumonic effusion    Interim events:   No acute events overnight    Subjective:  Patient is tolerating chest tube.  He continues to experience cough.  He has not experienced fever overnight.    ROS: No fevers, chills, nausea, vomiting.  Positive for chest pain related to chest tube placement    Objective     24-Hour Vitals Range  Vital Signs                  Vital Signs:  Last Filed                   Vital Signs: 24 Hour Range   BP: 116/36 (10/31 1232)  Temp: 36.3 ???C (97.3 ???F) (10/31 1232)  Pulse: 73 (10/31 1232)  Respirations: 16 PER MINUTE (10/31 1232)  SpO2: 97 % (10/31 1232)  O2 Delivery: Nasal Cannula (10/31 1232)  BP: (111-129)/(36-50)   Temp:  [36.3 ???C (  97.3 ???F)-37 ???C (98.6 ???F)]   Pulse:  [72-80]   Respirations:  [16 PER MINUTE-18 PER MINUTE]   SpO2:  [93 %-100 %]   O2 Delivery: Nasal Cannula    Intensity Pain Scale (Self Report): (not recorded)      Intake/Output Summary: (Last 24 hours)    Intake/Output Summary (Last 24 hours) at 11/21/16 1321  Last data filed at 11/21/16 1251   Gross per 24 hour   Intake              297 ml   Output              400 ml   Net             -103 ml      Stool Occurrence: 0    Physical Exam   General: Elderly male.  No distress.  Chest tube in place  Heent: PERRLA  Cardiovascular: Systolic ejection murmur present.  Regular rate and rhythm  Respiratory: Coarse breath sounds bilaterally, left greater than right  Abdomen: Soft nontender normal active bowel sounds.  Skin: No evidence of skin breakdown  Neurological: Alert and oriented x4.  No gross deficit    Medications Scheduled Meds:  alteplase (ACTIVASE) 10 mg in sodium chloride 0.9% (NS) 30 mL intrapleural syringe 10 mg Intrapleural BID   And      dornase alfa (PULMOZYME) 5 mg in water (sterile) for injection 30 mL intrapleural syringe 5 mg Intrapleural BID   aspirin EC tablet 81 mg 81 mg Oral QDAY   carvedilol (COREG) tablet 25 mg 25 mg Oral BID   cilostazol(+) (PLETAL) tablet 50 mg 50 mg Oral BID before meals   dutasteride (AVODART) capsule 0.5 mg 0.5 mg Oral QDAY   enoxaparin (LOVENOX) syringe 40 mg 40 mg Subcutaneous QDAY(21)   hydrALAZINE (APRESOLINE) tablet 100 mg 100 mg Oral TID   levothyroxine (SYNTHROID) tablet 100 mcg 100 mcg Oral QDAY   montelukast (SINGULAIR) tablet 10 mg 10 mg Oral QDAY   piperacillin/tazobactam  (ZOSYN) 4.5 g/100 mL iso-osmotic IVPB 4.5 g Intravenous Q6H*   vancomycin  (VANCOCIN)  750 mg in D5W IVPB (premade) 750 mg Intravenous Q12H*   Continuous Infusions:  PRN and Respiratory Meds:albuterol Q4H PRN, vancomycin   IVPB Q12H* **AND** vancomycin, pharmacy to manage Per Pharmacy    albuterol Q4H PRN, vancomycin   IVPB Q12H* 750 mg at 11/21/16 0644 **AND** vancomycin, pharmacy to manage Per Pharmacy    Lab Review  Recent Labs      11/20/16   0522  11/20/16   1315  11/21/16   0624   HGB  9.4*  8.8*  8.4*       24-hour labs:    Results for orders placed or performed during the hospital encounter of 11/20/16 (from the past 24 hour(s))   CELL COUNT W/DIFF-FLUIDS    Collection Time: 11/20/16  4:20 PM   Result Value Ref Range    White Blood Cells,Fluid 1,400 /UL    Red Blood Cells,Fluid 1,900 /UL    Segmented Neutrophils, Fluid 89 %    Lymphocytes,Fluid 3 %    Monocyte/Histo,Fluid 8 %    Fluid Source PLEURAL FLUID     Pathology Interpretation,Fluid ACUTE INFLAMMATION  HEMORRHAGIC FLUID       Pathologist Signature       INTERPRETED BY Corlis Hove M.D.  By the PATH SIGNATURE ABOVE, I attest that I have personally formulated the final interpretation expressed in this report and that  the above diagnosis is   based upon my examination of the slides and/or other material indicated in this   report.     PLEURAL FLUID ALBUMIN    Collection Time: 11/20/16  4:20 PM   Result Value Ref Range    Pleural Fluid Albumin <1.5 g/dL   PLEURAL FLUID AMYLASE    Collection Time: 11/20/16  4:20 PM   Result Value Ref Range    Pleural Fluid Amylase 12 U/L   PLEURAL FLUID TOTAL BILIRUBIN    Collection Time: 11/20/16  4:20 PM   Result Value Ref Range    Pleural Fluid Total Bilirubin 0.4 mg/dL   PLEURAL FLUID CHOLESTEROL    Collection Time: 11/20/16  4:20 PM   Result Value Ref Range    Pleural Fluid Cholesterol 33 <45 mg/dL   PLEURAL FLUID GLUCOSE    Collection Time: 11/20/16  4:20 PM   Result Value Ref Range    Pleural Fluid Glucose 24 (L) 70 - 100 mg/dL   PLEURAL FLUID LACTATE DEHYDROGENASE    Collection Time: 11/20/16  4:20 PM   Result Value Ref Range    Pleural Fluid Lactate Dehydrogenase 293 (H) 67 - 140 U/L   PLEURAL FLUID LIPASE    Collection Time: 11/20/16  4:20 PM   Result Value Ref Range    Pleural Fluid Lipase 24 U/L   PLEURAL FLUID PH    Collection Time: 11/20/16  4:20 PM   Result Value Ref Range    Pleural Fluid Ph 7.29 (L) 7.60 - 7.66   PLEURAL FLUID TOTAL PROTEIN    Collection Time: 11/20/16  4:20 PM   Result Value Ref Range    Pleural Fluid Total Protein 2.8 (H) <1.1 g/dL   PLEURAL FLUID TRIGLYCERIDES    Collection Time: 11/20/16  4:20 PM   Result Value Ref Range    Pleural Fluid Triglycerides 33 mg/dL   CULTURE-WOUND/TISSUE/FLUID(AEROBIC ONLY)W/SENSITIVITY    Collection Time: 11/20/16  4:20 PM   Result Value Ref Range    Battery Name ROUTINE CULTURE     Specimen Description PLEURAL FLUID     Special Requests NONE     Direct Gram Stain FEW  NEUTROPHILS  FEW  RBC'S  NO ORGANISMS SEEN       Culture NO GROWTH 1 DAY     Report Status     GRAM STAIN    Collection Time: 11/20/16  4:20 PM   Result Value Ref Range    Battery Name GRAM STAIN Specimen Description PLEURAL FLUID     Special Requests NONE     Gram Stain FEW  NEUTROPHILS  FEW  RBC'S  NO ORGANISMS SEEN       Report Status FINAL  11/21/2016      BASIC METABOLIC PANEL    Collection Time: 11/21/16  6:24 AM   Result Value Ref Range    Sodium 130 (L) 137 - 147 MMOL/L    Potassium 3.9 3.5 - 5.1 MMOL/L    Chloride 100 98 - 110 MMOL/L    CO2 25 21 - 30 MMOL/L    Anion Gap 5 3 - 12    Glucose 82 70 - 100 MG/DL    Blood Urea Nitrogen 25 7 - 25 MG/DL    Creatinine 1.61 0.4 - 1.24 MG/DL    Calcium 7.9 (L) 8.5 - 10.6 MG/DL    eGFR Non African American >60 >60 mL/min    eGFR African American >60 >60 mL/min   VANCOMYCIN TROUGH  Collection Time: 11/21/16  6:24 AM   Result Value Ref Range    Vancomycin Trough 9.1 (L) 10.0 - 20.0 MCG/ML   CBC AND DIFF    Collection Time: 11/21/16  6:24 AM   Result Value Ref Range    White Blood Cells 15.7 (H) 4.5 - 11.0 K/UL    RBC 2.86 (L) 4.4 - 5.5 M/UL    Hemoglobin 8.4 (L) 13.5 - 16.5 GM/DL    Hematocrit 45.4 (L) 40 - 50 %    MCV 90.6 80 - 100 FL    MCH 29.4 26 - 34 PG    MCHC 32.5 32.0 - 36.0 G/DL    RDW 09.8 (H) 11 - 15 %    Platelet Count 483 (H) 150 - 400 K/UL    MPV 7.4 7 - 11 FL            Microbiology - Resulted Micro Last 72 Hrs      CULTURE-WOUND/TISSUE/FLUID(AEROBIC ONLY)W/SENSITIVITY  Resulted: 11/21/16 1001, Result status: Preliminary result   Ordering provider:  Felipa Eth  11/20/16 1635 Resulting lab:  [place] [facility]    Specimen Information    Source Collected On   Pleural Fluid 11/20/16 1620          Components    Component Value Flag   Battery Name ROUTINE CULTURE  ???   Specimen Description PLEURAL FLUID  ???   Special Requests NONE  ???   Direct Gram Stain --  ???   Result:       FEW  NEUTROPHILS  FEW  RBC'S  NO ORGANISMS SEEN     Culture NO GROWTH 1 DAY  ???   Report Status --  ???            CULTURE-URINE W/SENSITIVITY  Resulted: 11/21/16 0906, Result status: Final result   Ordering provider:  Elsie Amis, MD  11/20/16 [phone] Resulting lab:  [place] [facility] Specimen Information    Source Collected On   Urine 11/20/16 0842          Components    Component Value Flag   Battery Name URINE CULTURE  ???   Specimen Description URINE  ???   Special Requests NONE  ???   Culture --  ???   Result:       <10,000 organisms/ml  CONTAMINANT     Report Status --  ???   Result:       FINAL  11/21/2016              GRAM STAIN  Resulted: 11/21/16 0351, Result status: Final result   Ordering provider:  Felipa Eth  11/20/16 1635 Resulting lab:  [place] [facility]    Specimen Information    Source Collected On   Pleural Fluid 11/20/16 1620          Components    Component Value Flag   Battery Name GRAM STAIN   ???   Specimen Description PLEURAL FLUID  ???   Special Requests NONE  ???   Gram Stain --  ???   Result:       FEW  NEUTROPHILS  FEW  RBC'S  NO ORGANISMS SEEN     Report Status --  ???   Result:       FINAL  11/21/2016              CULTURE-BLOOD W/SENSITIVITY  Resulted: 11/21/16 0115, Result status: Preliminary result   Ordering provider:  Elsie Amis, MD  11/20/16 [phone]  Resulting lab:  [place] [facility]    Specimen Information    Source Collected On   Blood 11/20/16 0620          Components    Component Value Flag   Battery Name BLOOD CULTURE  ???   Specimen Description --  ???   Result:       BLOOD  RIGHT  WRIST     Special Requests NONE  ???   Culture NO GROWTH 1 DAY  ???   Report Status --  ???            CULTURE-BLOOD W/SENSITIVITY  Resulted: 11/21/16 0115, Result status: Preliminary result   Ordering provider:  Elsie Amis, MD  11/20/16 [phone] Resulting lab:  [place] [facility]    Specimen Information    Source Collected On   Blood 11/20/16 0630          Components    Component Value Flag   Battery Name BLOOD CULTURE  ???   Specimen Description --  ???   Result:       BLOOD  LEFT  ANTECUBITAL     Special Requests NONE  ???   Culture NO GROWTH 1 DAY  ???   Report Status --  ???                Pertinent labs reviewed    Radiology and other Diagnostics Review:    Pertinent studies reviewed.   Line Plcmt 1v Cxr Result Date: 11/20/2016  Placement of a left pleural catheter without evidence of pneumothorax. No significant change in size of a partially loculated left pleural fluid collection and adjacent consolidation.  Finalized by Francis Dowse, M.D. on 11/20/2016 5:45 PM. Dictated by Francis Dowse, M.D. on 11/20/2016 5:43 PM.

## 2016-11-21 NOTE — Consults
CLINICAL NUTRITION                                                        Clinical Nutrition Assessment Summary     NAME:Javier Gutierrez             MRN: [id number]             DOB:Jan 13, 1941          AGE: 76 y.o.  ADMISSION DATE: 11/20/2016             DAYS ADMITTED: LOS: 1 day    Nutrition Assessment of Patient:  BMI Categories Adult: Acceptable: 18.5-24.9 (BMI 21.52)  Unintentional Weight Loss:  (5% x 1 year (not significant))  Malnutrition Assessment: Does not meet criteria  Current Oral Intake: NPO  Estimated Calorie Needs: 1700-1840 kcal (25-27 kcal/kg present wt 68kg)  Estimated Protein Needs: 82-88g (1.2-1.3g/kg present wt 68kg)  Oral Diet Order: NPO    Comments:  76 year old man with history of hypertension, hyperlipidemia, coronary artery disease, aortic stenosis, CKD, PAD, and lifetime smoker who presents with concern for weakness and cough. Per daughter he is reported to have a fall one week ago where he was down for 15 hours and generalized worsening fatigue over the last few weeks. Also with stage I coccyx pressure injury per RN documentation. Consult received for nutritional assessment. Appreciate the consult. Spoke mostly with daughter today. She endorses very poor appetite over the past few months, stating he simply does not like eating. He lives at an assisted living facility, and on a good day will eat breakfast with his friends at a restaurant & lunch at the senior center. However, has been skipping most meals lately. She denies severe wt loss, though does feel he has lost ~10# over the past year. Says he weighed 158# in July 2017; current wt 158#. Since his fall a few days ago, she has been encouraging him to drink Enlive (350 kcal & 20g protein per carton), which he has been doing ~3x a day. Had one this a.m. while still on a Cardiac diet. Noted he is s/p SLP eval this a.m., recommending NPO given concern for aspiration. Per daughter, team planning on placing corpak for EN feeds. Recommendation:  Diet textures per SLP. If prolonged NPO status anticipated & corpak placed, REC goal of Isosource 1.5 @ 1mL/hr. H2O bolus Q4hr. Will provide at goal 1800 kcal, 82g protein & free water (EN + water boluses) daily          Intervention / Plan:  EN recs if corpak placed  Will monitor NPO status, EN vs. diet progression  Will monitor GI function, wt trends, labs    Nutrition Diagnosis:  Inadequate oral intake  Etiology: decreased appetite, dysphagia  Signs & Symptoms: poor PO intake PTA, current NPO status    Goals:  Initiate nutrition  Time Frame: Within 72 Hours      [place] Luetkemeyer, MS, RD, LD  Pager: [phone]  Phone: [phone]

## 2016-11-21 NOTE — Progress Notes
OCCUPATIONAL THERAPY  NO TREATMENT NOTE     The patient was not seen due to: Patient at test/procedure, will follow for evaluation.    Therapist: Ebony Hail, OT  Date: 11/21/2016

## 2016-11-21 NOTE — Patient Education
Medication Education    Zakary Kimura accepted counseling and was receptive.  he verbalized understanding.    The following medications were discussed:  Zosyn  Vancomycin  Asprin  Coreg  Dutasteride  Hydralazine  Synthroid  Singulair  Alteplase  Dornase alfa    Where indicated, the patient was provided with additional medication and/or disease-state information.  All patient questions were answered and patient acknowledged understanding of the medications, side effects and other pertinent medication information.    Follow up should occur daily.    Continue to address: indications    Gwynn Burly, RN

## 2016-11-21 NOTE — Progress Notes
SPEECH-LANGUAGE PATHOLOGY  CLINICAL SWALLOW ASSESSMENT     EVALUATION SUMMARY  Summary: Pt seen for a clinical swallow evaluation. Pt presents w/ moderate-severe oropharyngeal dysphagia characterized by delayed swallow initiation and inadequate airway protection. Unknown etiology. Immediate s/s aspiration appreciated w/ thin liquids via teaspoon/straw and nectar via cup. Attempted chin tuck strategy and cognitively pt was not able to complete. Pt's daughter and son in room for evaluation report pt has coughed w/ foods and drinks for years and that cough has worsened lately. Family denies pt has had pneumonia in past. Given severity of s/s aspiration present at bedside swallow evaluation, recommend pt be placed NPO until further instrumental evaluation can be determine safest PO diet. Discussed recommendations w/ pt and pt's family who are in agreement w/ plan. Discussed recommendations w/ RN. Please see below for additional details.    RECOMMENDATIONS  NPO  Medications NPO  Videoswallow 11/21/16  Excellent oral care to reduce risk of aspirating bacteria in oral secretions  SLP will follow up 3-5x/week    Oral Stage Summary*: Pt seen w/ thin liquids via teaspoon/cup/straw and nectar via teaspoon/cup. Bolus withdraw from spoon was weak. Bolus formation and AP transfer appeared functional.     Pharyngeal Stage Summary*: Swallow initiation was delayed. Hyolaryngeal elevation appears normal w/ thin via teaspoon small immediate cough upon initial presentation. No overt s/s aspiration w/ additional teaspoon boluses nor w/ cup sips. Large immediate cough present w/ straw. No s/s aspiration w/ nectar via teaspoon however coughs w/ x3 presentations of nectar via cup.    Plan: Continue Treatment 3-5x/week    Prognosis: Good  NOMS Dysphagia Rating: 2-Moderately-Severe Dysphagia -Not able to swallow safely by mouth for nutrition/hydration but may take some consistency w/ consistent max cues in therapy only. Alternative method of feeding required.  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 Imaging with atelectasis and loculated left sided pleural effusion concerning for possible endobronchial lesion with post-obstructive  pneumonia. Pleural effusion could be malignant versus parapneumonic effusion from pneumonia or aspiration versus hemothorax given recent fall. Bedside ultrasound with left sided pleural effusion with fibrinous exudate and loculations. Chest tube subsequently placed given concern for complicated pleural space and potential need for tpa/dornase. Pleural effusion slightly turbid. Will maintain chest tube to water seal overnight and will determine duration of placement based on pleural studies.  Lives With: Alone  Receives Help From: Family  Psychosocial Status: Willing and Cooperative to Participate  Persons Present: Son, Daughter    Subjective*  Pain: Patient has no complaint of pain  Trach Presence: No  Feeding Tube Present During Eval: None    Nutrition*  Nutrition Prior To Hospitalization: Oral, Regular, Thin Liquids  Current Form Of Nutrition: Oral, Regular, Thin Liquids    ORAL MECH EXAM  Oral Mech WFL*: No  Oral Mech Exam Summary*: Labial and lingual ROM/strength WFL. Labial movements were disorganized during alternating movements. Vocal quality is weak. Cough present and weak. Edentulous w/ dentures.    CT Chest 10/30  1. Consolidation of the majority of the left mid and lower lung, most   compatible with pneumonia and partially loculated pleural effusion. Underlying neoplastic endobronchial lesion possible,  recommend follow-up   CT or PET/CT after appropriate medical therapy. ???Secretions within the   trachea, suspicious for aspiration.    2. Moderate sized partially loculated loculated left pleural effusion with   areas of probable pleural thickening, may be inflammatory though   metastatic pleural disease could appear similar    3. Mild mediastinal and probable hilar adenopathy.     4. Calcific coronary artery disease.    Education*  Persons Educated: Pt/Family  Barriers To Learning: None Noted  Interventions: Family Educated, Scientist, research (physical sciences) Methods: Verbal  Topics: Dysphagia  Patient Response: Verbalized Understanding  Goal Formulation: With Pt/Family    Clinical Swallow Goals*  Goal : Pt will participate in videoswallow evaluation given minimal cues.  Goal : Pt will participate in ongoing swallow evaluation given minimal cues.    G-Codes: Swallowing  G [id number] Current Status: 80-99% Impairment  G [id number] Goal Status: 60-79% Impairment    Based on above evaluation and clinical judgment.     Therapist:Anuja Manka Cain Saupe, MS, L/CCC-SLP        Office: [phone]  Date:11/21/2016

## 2016-11-21 NOTE — Progress Notes
Pharmacy Vancomycin Note  Subjective:   Javier Gutierrez is a 76 y.o. male being treated for post-obstructive pneumonia.    Objective:     Current Vancomycin Orders   Medication Dose Route Frequency    vancomycin  (VANCOCIN)  750 mg in D5W IVPB (premade)  750 mg Intravenous Q12H*    And    vancomycin, pharmacy to manage  1 each Service Per Pharmacy     Start Date of  Vancomycin therapy: 11/20/2016  Additional Abx: Zosyn  Cultures: 10/30 Blood:NGTD, 10/30 Pleural fluid: in process  White Blood Cells   Date/Time Value Ref Range Status   11/20/2016 1315 17.7 (H) 4.5 - 11.0 K/UL Final   11/20/2016 0522 18.4 (H) 4.5 - 11.0 K/UL Final     Creatinine   Date/Time Value Ref Range Status   11/21/2016 0624 1.01 0.4 - 1.24 MG/DL Final   16/10/9602 5409 0.97 0.4 - 1.24 MG/DL Final     Blood Urea Nitrogen   Date/Time Value Ref Range Status   11/21/2016 0624 25 7 - 25 MG/DL Final     Estimated CrCl: 60 mL/min  Actual Weight:  68 kg (150 lb)    Drug Levels:  Vancomycin Trough   Date/Time Value Ref Range Status   11/21/2016 0624 9.1 (L) 10.0 - 20.0 MCG/ML Final       Assessment:   Target levels for this patient: trough ~15 mcg/mL.  Evaluation of level(s): Vancomycin trough drawn prior to 3rd dose and is below goal.  Anticipate since 75yo will accumulate with future doses    Plan:   1. Continue Vancomycin 750mg  IV Q12hr  2. Next scheduled level(s): 2 days if continued  3. Pharmacy will continue to monitor and adjust therapy as needed.    [place], [place]  11/21/2016

## 2016-11-21 NOTE — Progress Notes
RN paged Dr Alfonso Ellis regarding chest tube output (approx between alteplase and dornase and following dornase). Patient was placed on water seal to travel to GI for procedure and then per Dr Flossie Dibble, place patient back on suction and team will reassess tomorrow am. Consult pulmonary of primary team if output sharply increases or patient becomes symptomatic. Primary RN Zixi notified.

## 2016-11-21 NOTE — Progress Notes
PHYSICAL THERAPY  NOTE     Patient's case reviewed and discussed during interdisciplinary rounds.    Therapist: Norva Karvonen, Physical therapist assistant  Date: 11/21/2016

## 2016-11-22 LAB — COMPREHENSIVE METABOLIC PANEL
Lab: 0.6 mg/dL (ref 0.3–1.2)
Lab: 1 mg/dL (ref 0.4–1.24)
Lab: 112 mg/dL — ABNORMAL HIGH (ref 70–100)
Lab: 12 U/L (ref 7–56)
Lab: 127 MMOL/L — ABNORMAL LOW (ref 137–147)
Lab: 2.1 g/dL — ABNORMAL LOW (ref 3.5–5.0)
Lab: 22 MMOL/L (ref 21–30)
Lab: 23 U/L (ref 7–40)
Lab: 27 mg/dL — ABNORMAL HIGH (ref 7–25)
Lab: 36 U/L (ref 25–110)
Lab: 5 g/dL — ABNORMAL LOW (ref 6.0–8.0)
Lab: 7 (ref 3–12)
Lab: 7.8 mg/dL — ABNORMAL LOW (ref 8.5–10.6)
Lab: >60 mL/min (ref >60)
Lab: >60 mL/min (ref >60)
Lab: 129 MMOL/L — ABNORMAL LOW (ref >60)

## 2016-11-22 LAB — POC POTASSIUM: Lab: 3.8 MMOL/L (ref 3.5–5.1)

## 2016-11-22 LAB — POC IONIZED CALCIUM: Lab: 1.1 MMOL/L (ref 1.0–1.3)

## 2016-11-22 LAB — MAGNESIUM: Lab: 1.9 mg/dL — ABNORMAL LOW (ref 1.6–2.6)

## 2016-11-22 LAB — POC BLOOD GAS ARTERIAL
Lab: 1 MMOL/L — ABNORMAL HIGH (ref 11–15)
Lab: 25 MMOL/L (ref 21–28)
Lab: 36 mmHg (ref 35–45)
Lab: 7.4 FL — ABNORMAL HIGH (ref 7.35–7.45)

## 2016-11-22 LAB — POC HEMATOCRIT
Lab: 29 % — ABNORMAL LOW (ref 40–50)
Lab: 9.9 g/dL — ABNORMAL LOW (ref 13.5–16.5)

## 2016-11-22 LAB — POC SODIUM: Lab: 130 MMOL/L — ABNORMAL LOW (ref 137–147)

## 2016-11-22 LAB — CBC: Lab: 20 10*3/uL — ABNORMAL HIGH (ref 4.5–11.0)

## 2016-11-22 LAB — CBC AND DIFF
Lab: 19 K/UL — ABNORMAL HIGH (ref 4.5–11.0)
Lab: 2.9 M/UL — ABNORMAL LOW (ref 4.4–5.5)

## 2016-11-22 LAB — PHOSPHORUS: Lab: 3.2 mg/dL — ABNORMAL LOW (ref 2.0–4.5)

## 2016-11-22 LAB — POC GLUCOSE: Lab: 134 mg/dL — ABNORMAL HIGH (ref 70–100)

## 2016-11-22 MED ORDER — SODIUM CHLORIDE 0.9 % IV SOLP
30 mL/kg | INTRAVENOUS | 0 refills | Status: CP
Start: 2016-11-22 — End: ?

## 2016-11-22 NOTE — Progress Notes
Brief ICU Evaluation:    Javier Gutierrez is a 76 year old male with PMH Aortic Stenosis (s/p TAVR), HFpEF, CAD, PAD, CVA, HTN who presented with weakness and cough - found to have loculated L pleural effusion - s/p chest tube placement, fluid studies suggestive of complicated parapneumonic effusion.    Rapid response called due to worsening hypoxia post NTS along with mild hypotension. On my exam patient has some mild increased work of breathing - however per nursing and respiratory therapy report - is similar to breathing pattern prior to rapid response. Patient does not feel symptomatically worse per his personal report. CXR obtained - actually demonstrating improvement in upper lobe aeration and peripheral areas of loculation appear to be layering in a more dependent fashion. ABG demonstrates a PaO2 of 60 - however this is reflective of his prior 3L requirement and not while being on a NRB.    We were quickly able to wean him down to 5L NC - and patient continues to feel similar to earlier in the day. We bolused 500 cc of fluid with improvement in blood pressures to 120s/60s (in the setting of significant chest tube output, nearly 2L)    Plan:  --no indication for ICU level of care at this time  --RRT to follow up in 2 hours to reassess oxygenation and blood pressure - given history of COPD (FEV1/SVC ratio 67% in 2017) and long standing smoking history - would target goal SPO2 >88%  --switched to venti-mask given that patient is primarily a mouth breather    Plan of care discussed with intensivist on call, Dr. Burton Apley. Plan of care also communicated with attending hospitalist.    Lucretia Field MD  Pulmonary/Critical Care Fellow  [phone]

## 2016-11-22 NOTE — Progress Notes
PHYSICAL THERAPY  NOTE         Nurse requesting to hold therapy at this time. Patient undergoing medical work up at this time. Will continue to follow.     Therapist: Barnett Hatter  Date: 11/22/2016

## 2016-11-22 NOTE — Response Teams
NICOM Fluid Bolus or Passive Leg Raise Challenge: Yes  Fluid Bolus Challenge: Yes, Passive Leg Raise: No  Fluid Volume infused: Time started:    CO CI HR NIBP MAP TPR TPRI SV SVI SVV   Baseline  2.9       40    Challenge  3.2       43    Stroke Volume Index Change: 7.7%

## 2016-11-22 NOTE — Progress Notes
Called to patient bedside by RN for increased distress and lower saturation. Pt on 3.5 LNC saturation 93% Rhonchi throughout, patient appeared to be unable to cough effectively. NTsuction preformed x2 with pt increase ability to breath comfortably. Oral suction was set up and pt was instructed on how to use device to assist in secretion clearance. RN notified of change to beside.

## 2016-11-22 NOTE — Progress Notes
Daily Progress Note      Assessment/Plan     Javier Gutierrez is a 76 y.o. male  Admission Date: 11/20/2016  LOS: 2    Principal Problem:    Parapneumonic effusion  Active Problems:    Tobacco use disorder    CAD (coronary artery disease)    Chronic diastolic (congestive) heart failure [facility])    Pneumonia      Hospital Course:       Active Problem List  76 year old man with history of hypertension, hyperlipidemia, coronary artery disease, aortic stenosis, chronic kidney disease, peripheral artery disease, and lifetime smoker who presents with concern for weakness and cough.  Per daughter he is reported to have a fall one week ago where he was down for 15 hours and generalized worsening fatigue over the last few weeks.  ???  Complicated parapneumonic effusion  -LDH 293, white count 1400, pH of pleural fluid 7.29.  -Reported coughing and weakness over the last few weeks and weight loss over the last year  -CT Chest showed consolidation of the left mid and lower lung.  Patient had chest tube placed on 10/30.  >    Continue chest tube. Underlying neoplastic endobronchial lesion possible, recommend follow-up CT or PET/CT after appropriate medical therapy.  > white count increased.  > continue vancomycin/zosyn. Discussed with pulm team.    Hypotension  > received coreg and hydralazine this morning.  > 32ml/kg bolus and continue to monitor BP.  > transfer to ICU if not fluid responsive.    CAD  -Continue PTA Aspirin/COreg  ???  CT head negative and CT C spine was suspicious for possible lytic lesions to the c spine  Plan  >Consult pulm for possible bronch and further evaluation for suspicious mass   >Continue Vanc and Zosyn started in the ED  >Blood cultures sent will follow-up   ???  Recent Mechanical fall  -CT head negative  >Consult PT/OT for eval  ???  HLD/HTN  -He is of his simvastatin and fenofibrate due to reported muscle weakness since last Thursday  -Continue PTA coreg and hydralazine  ???  Hypothyroidism  -Check TSH/Free T4 -Continue PTA synthroid  ???  Aortic Valve stenosis S/P TAVR in 2017  ???  Chronic Tobacco abuse  -Has not smoked over the past week since he has felt week.  -Counseled and discussed smoking cessation with family    PCP: Steva Ready, Phone: [phone], Fax: [phone]  Consultants:  FEN: IVF, electrolytes stable, DIET CARDIAC(LOW FAT/LOW SODIUM)   PPX: Lovenox  Code Status: DNAR-Full Intervention    Dispo: Continue admission for completed parapneumonic effusion      Candelaria Celeste, MD  Internal Medicine  Med [facility], [phone]      Subjective   This is a 76 y.o. male admitted for Parapneumonic effusion    Interim events:   Patient rapid responded overnight.   Patient with continued MAP < 65.     Subjective:  Patient is not feeling lightheaded or dizziness at this time. Patient had rapid response for hypotension and hypoxia overnight. He received bolus of 500cc and hypoxia/hypotension was thought to be secondary to significant chest tube output.     ROS: No fevers, chills, nausea, vomiting.  Positive for chest pain related to chest tube placement    Objective     24-Hour Vitals Range  Vital Signs                  Vital Signs:  Last Filed  Vital Signs: 24 Hour Range   BP: 86/45 (11/01 1000)  Temp: 36.6 ???C (97.8 ???F) (11/01 0749)  Pulse: 74 (11/01 0749)  Respirations: 20 PER MINUTE (11/01 0749)  SpO2: 100 % (11/01 1000)  O2 Delivery: Venturi Mask (11/01 1000)  BP: (82-135)/(35-56)   Temp:  [36.3 ???C (97.3 ???F)-37 ???C (98.6 ???F)]   Pulse:  [60-94]   Respirations:  [16 PER MINUTE-26 PER MINUTE]   SpO2:  [85 %-100 %]   O2 Delivery: Venturi Mask    Intensity Pain Scale (Self Report): (not recorded)      Intake/Output Summary: (Last 24 hours)    Intake/Output Summary (Last 24 hours) at 11/22/16 1022  Last data filed at 11/22/16 0930   Gross per 24 hour   Intake              650 ml   Output             2340 ml   Net            -1690 ml      Stool Occurrence: 0    Physical Exam General: Elderly male.  No distress.  Chest tube in place  Heent: PERRLA  Cardiovascular: Systolic ejection murmur present.  Regular rate and rhythm  Respiratory: Coarse breath sounds bilaterally, left greater than right  Abdomen: Soft nontender normal active bowel sounds.  Skin: No evidence of skin breakdown  Neurological: Alert and oriented x4.  No gross deficit    Medications    Scheduled Meds:    alteplase (ACTIVASE) 10 mg in sodium chloride 0.9% (NS) 30 mL intrapleural syringe 10 mg Intrapleural BID   And      dornase alfa (PULMOZYME) 5 mg in water (sterile) for injection 30 mL intrapleural syringe 5 mg Intrapleural BID   aspirin EC tablet 81 mg 81 mg Oral QDAY   cilostazol(+) (PLETAL) tablet 50 mg 50 mg Oral BID before meals   dutasteride (AVODART) capsule 0.5 mg 0.5 mg Oral QDAY   enoxaparin (LOVENOX) syringe 40 mg 40 mg Subcutaneous QDAY(21)   levothyroxine (SYNTHROID) tablet 100 mcg 100 mcg Oral QDAY   montelukast (SINGULAIR) tablet 10 mg 10 mg Oral QDAY   piperacillin/tazobactam  (ZOSYN) 4.5 g/100 mL iso-osmotic IVPB 4.5 g Intravenous Q6H*   SODIUM CHLORIDE 0.9 % IV SOLP (Cabinet Override)   NOW   vancomycin  (VANCOCIN)  750 mg in D5W IVPB (premade) 750 mg Intravenous Q12H*   Continuous Infusions:  PRN and Respiratory Meds:albuterol Q4H PRN, vancomycin   IVPB Q12H* **AND** vancomycin, pharmacy to manage Per Pharmacy    albuterol Q4H PRN, vancomycin   IVPB Q12H* 750 mg at 11/22/16 0629 **AND** vancomycin, pharmacy to manage Per Pharmacy    Lab Review  Recent Labs      11/20/16   0522  11/20/16   1315  11/21/16   0624  11/21/16   2253  11/22/16   0547   HGB  9.4*  8.8*  8.4*  10.0*  8.7*       24-hour labs:    Results for orders placed or performed during the hospital encounter of 11/20/16 (from the past 24 hour(s))   POC BLOOD GAS ARTERIAL    Collection Time: 11/21/16 10:47 PM   Result Value Ref Range    PH-ART-POC 7.46 (H) 7.35 - 7.45    PCO2-ART-POC 36 35 - 45 MMHG PO2-ART-POC 60 (L) 80 - 100 MMHG    Base Ex-ART-POC 1.0 MMOL/L    O2  Sat-ART-POC 92.0 (L) 95 - 99 %    Bicarbonate-ART-POC 25.0 21 - 28 MMOL/L   POC HEMATOCRIT    Collection Time: 11/21/16 10:47 PM   Result Value Ref Range    Hemoglobin POC 9.9 (L) 13.5 - 16.5 GM/DL    Hematocrit POC 21.3 (L) 40 - 50 %   POC POTASSIUM    Collection Time: 11/21/16 10:47 PM   Result Value Ref Range    Potassium-POC 3.8 3.5 - 5.1 MMOL/L   POC SODIUM    Collection Time: 11/21/16 10:47 PM   Result Value Ref Range    Sodium-POC 130 (L) 137 - 147 MMOL/L   POC IONIZED CALCIUM    Collection Time: 11/21/16 10:47 PM   Result Value Ref Range    Ionized Calcium-POC 1.16 1.0 - 1.3 MMOL/L   CBC    Collection Time: 11/21/16 10:53 PM   Result Value Ref Range    White Blood Cells 20.4 (H) 4.5 - 11.0 K/UL    RBC 3.39 (L) 4.4 - 5.5 M/UL    Hemoglobin 10.0 (L) 13.5 - 16.5 GM/DL    Hematocrit 08.6 (L) 40 - 50 %    MCV 90.2 80 - 100 FL    MCH 29.4 26 - 34 PG    MCHC 32.6 32.0 - 36.0 G/DL    RDW 57.8 (H) 11 - 15 %    Platelet Count 586 (H) 150 - 400 K/UL    MPV 7.0 7 - 11 FL   COMPREHENSIVE METABOLIC PANEL    Collection Time: 11/21/16 10:53 PM   Result Value Ref Range    Sodium 127 (L) 137 - 147 MMOL/L    Potassium 3.9 3.5 - 5.1 MMOL/L    Chloride 98 98 - 110 MMOL/L    Glucose 112 (H) 70 - 100 MG/DL    Blood Urea Nitrogen 27 (H) 7 - 25 MG/DL    Creatinine 4.69 0.4 - 1.24 MG/DL    Calcium 7.8 (L) 8.5 - 10.6 MG/DL    Total Protein 5.0 (L) 6.0 - 8.0 G/DL    Total Bilirubin 0.6 0.3 - 1.2 MG/DL    Albumin 2.1 (L) 3.5 - 5.0 G/DL    Alk Phosphatase 36 25 - 110 U/L    AST (SGOT) 23 7 - 40 U/L    CO2 22 21 - 30 MMOL/L    ALT (SGPT) 12 7 - 56 U/L    Anion Gap 7 3 - 12    eGFR Non African American >60 >60 mL/min    eGFR African American >60 >60 mL/min   MAGNESIUM    Collection Time: 11/21/16 10:53 PM   Result Value Ref Range    Magnesium 1.9 1.6 - 2.6 mg/dL   PHOSPHORUS    Collection Time: 11/21/16 10:53 PM   Result Value Ref Range Phosphorus 3.2 2.0 - 4.5 MG/DL   POC GLUCOSE    Collection Time: 11/21/16 10:56 PM   Result Value Ref Range    Glucose, POC 134 (H) 70 - 100 MG/DL   CBC AND DIFF    Collection Time: 11/22/16  5:47 AM   Result Value Ref Range    White Blood Cells 19.3 (H) 4.5 - 11.0 K/UL    RBC 2.91 (L) 4.4 - 5.5 M/UL    Hemoglobin 8.7 (L) 13.5 - 16.5 GM/DL    Hematocrit 62.9 (L) 40 - 50 %    MCV 88.0 80 - 100 FL    MCH 30.1 26 - 34 PG  MCHC 34.2 32.0 - 36.0 G/DL    RDW 16.1 11 - 15 %    Platelet Count 540 (H) 150 - 400 K/UL    MPV 7.5 7 - 11 FL    Neutrophils 88 (H) 41 - 77 %    Lymphocytes 4 (L) 24 - 44 %    Monocytes 8 4 - 12 %    Eosinophils 0 0 - 5 %    Basophils 0 0 - 2 %    Absolute Neutrophil Count 16.80 (H) 1.8 - 7.0 K/UL    Absolute Lymph Count 0.80 (L) 1.0 - 4.8 K/UL    Absolute Monocyte Count 1.50 (H) 0 - 0.80 K/UL    Absolute Eosinophil Count 0.00 0 - 0.45 K/UL    Absolute Basophil Count 0.00 0 - 0.20 K/UL   COMPREHENSIVE METABOLIC PANEL    Collection Time: 11/22/16  5:47 AM   Result Value Ref Range    Sodium 129 (L) 137 - 147 MMOL/L    Potassium 3.9 3.5 - 5.1 MMOL/L    Chloride 98 98 - 110 MMOL/L    Glucose 105 (H) 70 - 100 MG/DL    Blood Urea Nitrogen 30 (H) 7 - 25 MG/DL    Creatinine 0.96 0.4 - 1.24 MG/DL    Calcium 7.6 (L) 8.5 - 10.6 MG/DL    Total Protein 4.4 (L) 6.0 - 8.0 G/DL    Total Bilirubin 0.6 0.3 - 1.2 MG/DL    Albumin 1.8 (L) 3.5 - 5.0 G/DL    Alk Phosphatase 35 25 - 110 U/L    AST (SGOT) 18 7 - 40 U/L    CO2 23 21 - 30 MMOL/L    ALT (SGPT) 11 7 - 56 U/L    Anion Gap 8 3 - 12    eGFR Non African American >60 >60 mL/min    eGFR African American >60 >60 mL/min            Microbiology - Resulted Micro Last 72 Hrs      CULTURE-WOUND/TISSUE/FLUID(AEROBIC ONLY)W/SENSITIVITY  Resulted: 11/22/16 0207, Result status: Preliminary result   Ordering provider:  Felipa Eth  11/20/16 1635 Resulting lab:  [place] [facility]    Specimen Information    Source Collected On   Pleural Fluid 11/20/16 1620          Components Component Value Flag   Battery Name ROUTINE CULTURE  ???   Specimen Description PLEURAL FLUID  ???   Special Requests NONE  ???   Direct Gram Stain --  ???   Result:       FEW  NEUTROPHILS  FEW  RBC'S  NO ORGANISMS SEEN     Culture NO GROWTH 2 DAYS  ???   Report Status --  ???            CULTURE-BLOOD W/SENSITIVITY  Resulted: 11/22/16 0043, Result status: Preliminary result   Ordering provider:  Elsie Amis, MD  11/20/16 [phone] Resulting lab:  [place] [facility]    Specimen Information    Source Collected On   Blood 11/20/16 0620          Components    Component Value Flag   Battery Name BLOOD CULTURE  ???   Specimen Description --  ???   Result:       BLOOD  RIGHT  WRIST     Special Requests NONE  ???   Culture NO GROWTH 2 DAYS  ???   Report Status --  ???  CULTURE-BLOOD W/SENSITIVITY  Resulted: 11/22/16 0043, Result status: Preliminary result   Ordering provider:  Elsie Amis, MD  11/20/16 [phone] Resulting lab:  [place] [facility]    Specimen Information    Source Collected On   Blood 11/20/16 0630          Components    Component Value Flag   Battery Name BLOOD CULTURE  ???   Specimen Description --  ???   Result:       BLOOD  LEFT  ANTECUBITAL     Special Requests NONE  ???   Culture NO GROWTH 2 DAYS  ???   Report Status --  ???            CULTURE-URINE W/SENSITIVITY  Resulted: 11/21/16 0906, Result status: Final result   Ordering provider:  Elsie Amis, MD  11/20/16 [phone] Resulting lab:  [place] [facility]    Specimen Information    Source Collected On   Urine 11/20/16 0842          Components    Component Value Flag   Battery Name URINE CULTURE  ???   Specimen Description URINE  ???   Special Requests NONE  ???   Culture --  ???   Result:       <10,000 organisms/ml  CONTAMINANT     Report Status --  ???   Result:       FINAL  11/21/2016              GRAM STAIN  Resulted: 11/21/16 0351, Result status: Final result   Ordering provider:  Felipa Eth  11/20/16 1635 Resulting lab:  [place] [facility]    Specimen Information    Source Collected On Pleural Fluid 11/20/16 1620          Components    Component Value Flag   Battery Name GRAM STAIN   ???   Specimen Description PLEURAL FLUID  ???   Special Requests NONE  ???   Gram Stain --  ???   Result:       FEW  NEUTROPHILS  FEW  RBC'S  NO ORGANISMS SEEN     Report Status --  ???   Result:       FINAL  11/21/2016                  Pertinent labs reviewed    Radiology and other Diagnostics Review:    Pertinent studies reviewed.   Line Plcmt 1v Cxr    Result Date: 11/20/2016  Placement of a left pleural catheter without evidence of pneumothorax. No significant change in size of a partially loculated left pleural fluid collection and adjacent consolidation.  Finalized by Francis Dowse, M.D. on 11/20/2016 5:45 PM. Dictated by Francis Dowse, M.D. on 11/20/2016 5:43 PM.

## 2016-11-22 NOTE — Response Teams
Rapid Response Team Progress Note    Date: 11/22/2016 Time: 1:04 AM  Patient: Javier Gutierrez  Attending: Candelaria Celeste, MD Service: Med Private [phone]  Admission Date: 11/20/2016  LOS: 2 days    A Code/Rapid Response Timeline Event Report has been created for this patient on 11/1 at 0039.    A RRT was initiated for increased O2 demands. Pt on 10L from 5L after ambulation. Pt placed on venti mask at 55%. MICU fellow at bedside for evaluation. Pt SpO2 90-95% on venti mask. Will follow up with pt in 2 hours.      Lucille Passy, RN

## 2016-11-22 NOTE — Progress Notes
General Progress Note    Name:  Javier Gutierrez   EAVWU'J Date:  11/22/2016  Admission Date: 11/20/2016  LOS: 2 days                     Impression:  - CAP vs. HAP vs. Aspiration pneumonia  - left sided complicated parapneumonic effusion -- cultures and gram stain negative  - atelectasis, compressive from effusion versus endobronchial lesion  - hypotension -- sepsis vs. Hypovolemic vs. medication  - non-productive cough, pleuritic chest pain  - tobacco use  - AS s/p TAVR 07/2015  ???  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 Imaging with atelectasis and loculated left sided pleural effusion. Bedside ultrasound with left sided pleural effusion with fibrinous exudate and loculations. Given concern for complicated parapneumonic, chest tube was placed. Pleural fluid studies suggestive of complicated parapneumonic effusion (exudative, neutrophil predominant, glucose 24, pH 7.29) with cultures and gram stain negative thus far (though patient received empiric antibiotics prior to pleural fluid analysis). Given complicated parapneumonic effusion and loculations on imaging tpa/dornase initiated.    Patient more hypotensive today with increasing leukocytosis. Concern for progressive sepsis versus hypovolemic from poor po intake versus medication induced from anti-hypertensive. His CXR today shows improvement of his left sided pleural effusion, but still may have loculations that are not apparent on plain film. Should his hypotension recur, would consider transfer to higher level of care and CT chest to see if there is fluid collection that may require additional chest tube for adequate drainage and source control. Chest tube noted to be kinked externally on exam this morning -- the chest tube was secured more distally with resolution. Able to flush with normal saline without resistance  ???  Recommendations:  - continue tpa/dornase BID x3 days (day 2/3)    -- unit 61 will administer  - given good output, can continue chest tube to water seal  - if patient were to develop recurrent hypotension or signs of progressive sepsis, would have low threshold for transfer to MICU and repeat CT chest to evaluate for additional pleural fluid collection that is not communicating with chest tube  - will plan on repeat CT chest once tpa/dornase course complete to evaluate for resolution of pleural effusion and possible endobronchial lesion/mass  - continue antibiotics per primary team -- would continue broad coverage give hypotension and increasing leukocytosis  - patient will need aggressive pulmonary hygiene with [place] and incentive spirometer -- he has a very weak cough and likely not clearing secretions  ???  The patient was evaluated and discussed with Dr. Nicholes Rough. ???The pulmonary service will continue to follow.  ________________________________________________________________________    Subjective and 24 hr events:  - RRT called for progressive hypoxia following NTS with mild hypotension -- responded to IVF bolus and transitioned to venti-mask given mouth breathing  - patient appears fatigued and weak today  - has been trying to use [place] and incentive spirometer  - family concerned about poor po intake and overall decline  - had 2.3 liters of serosanguinous output from chest tube    History of Present Illness    ROS  - poor appetite  - persistent cough  - generalized weakness  Medications  Scheduled Meds:  alteplase (ACTIVASE) 10 mg in sodium chloride 0.9% (NS) 30 mL intrapleural syringe 10 mg Intrapleural BID   And      dornase alfa (PULMOZYME) 5 mg in water (sterile) for injection 30 mL intrapleural syringe 5 mg Intrapleural BID   aspirin EC tablet 81 mg 81 mg Oral QDAY   carvedilol (COREG) tablet 25 mg 25 mg Oral BID   cilostazol(+) (PLETAL) tablet 50 mg 50 mg Oral BID before meals   dutasteride (AVODART) capsule 0.5 mg 0.5 mg Oral QDAY   enoxaparin (LOVENOX) syringe 40 mg 40 mg Subcutaneous QDAY(21)   hydrALAZINE (APRESOLINE) tablet 100 mg 100 mg Oral TID   levothyroxine (SYNTHROID) tablet 100 mcg 100 mcg Oral QDAY   montelukast (SINGULAIR) tablet 10 mg 10 mg Oral QDAY   piperacillin/tazobactam  (ZOSYN) 4.5 g/100 mL iso-osmotic IVPB 4.5 g Intravenous Q6H*   vancomycin  (VANCOCIN)  750 mg in D5W IVPB (premade) 750 mg Intravenous Q12H*   Continuous Infusions:  PRN and Respiratory Meds:albuterol Q4H PRN, vancomycin   IVPB Q12H* **AND** vancomycin, pharmacy to manage Per Pharmacy      Objective                       Vital Signs: Last Filed                 Vital Signs: 24 Hour Range   BP: 100/56 (11/01 0749)  Temp: 36.6 ???C (97.8 ???F) (11/01 0749)  Pulse: 74 (11/01 0749)  Respirations: 20 PER MINUTE (11/01 0749)  SpO2: 95 % (11/01 0749)  O2 Delivery: Venturi Mask (11/01 0749) BP: (82-135)/(35-56)   Temp:  [36.3 ???C (97.3 ???F)-37 ???C (98.6 ???F)]   Pulse:  [60-94]   Respirations:  [16 PER MINUTE-26 PER MINUTE]   SpO2:  [85 %-100 %]   O2 Delivery: Venturi Mask     Vitals:    11/20/16 0518   Weight: 68 kg (150 lb)       Intake/Output Summary:  (Last 24 hours)    Intake/Output Summary (Last 24 hours) at 11/22/16 0938  Last data filed at 11/22/16 0930   Gross per 24 hour   Intake              650 ml   Output             2340 ml   Net            -1690 ml      Stool Occurrence: 0    Physical Exam  Gen: chronically ill appearing male, lying in bed on venti-mask  HEENT: dry mucous membranes, no OP erythema or lesions  CV: NRRR, no MRG  Lungs: normal WOB, diffuse expiratory wheezes, left sided chest tube to water seal with serosanguinous output  Abd: soft, nt, nd, nabs  Ext: no edema  Skin: no rash Neuro: grossly intact  Psych: normal affect    Lab Review  24-hour labs:    Results for orders placed or performed during the hospital encounter of 11/20/16 (from the past 24 hour(s))   POC BLOOD GAS ARTERIAL    Collection Time: 11/21/16 10:47 PM   Result Value Ref Range    PH-ART-POC 7.46 (H) 7.35 - 7.45    PCO2-ART-POC 36 35 - 45 MMHG    PO2-ART-POC 60 (L) 80 - 100 MMHG    Base Ex-ART-POC 1.0 MMOL/L    O2 Sat-ART-POC 92.0 (L) 95 - 99 %  Bicarbonate-ART-POC 25.0 21 - 28 MMOL/L   POC HEMATOCRIT    Collection Time: 11/21/16 10:47 PM   Result Value Ref Range    Hemoglobin POC 9.9 (L) 13.5 - 16.5 GM/DL    Hematocrit POC 04.5 (L) 40 - 50 %   POC POTASSIUM    Collection Time: 11/21/16 10:47 PM   Result Value Ref Range    Potassium-POC 3.8 3.5 - 5.1 MMOL/L   POC SODIUM    Collection Time: 11/21/16 10:47 PM   Result Value Ref Range    Sodium-POC 130 (L) 137 - 147 MMOL/L   POC IONIZED CALCIUM    Collection Time: 11/21/16 10:47 PM   Result Value Ref Range    Ionized Calcium-POC 1.16 1.0 - 1.3 MMOL/L   CBC    Collection Time: 11/21/16 10:53 PM   Result Value Ref Range    White Blood Cells 20.4 (H) 4.5 - 11.0 K/UL    RBC 3.39 (L) 4.4 - 5.5 M/UL    Hemoglobin 10.0 (L) 13.5 - 16.5 GM/DL    Hematocrit 40.9 (L) 40 - 50 %    MCV 90.2 80 - 100 FL    MCH 29.4 26 - 34 PG    MCHC 32.6 32.0 - 36.0 G/DL    RDW 81.1 (H) 11 - 15 %    Platelet Count 586 (H) 150 - 400 K/UL    MPV 7.0 7 - 11 FL   COMPREHENSIVE METABOLIC PANEL    Collection Time: 11/21/16 10:53 PM   Result Value Ref Range    Sodium 127 (L) 137 - 147 MMOL/L    Potassium 3.9 3.5 - 5.1 MMOL/L    Chloride 98 98 - 110 MMOL/L    Glucose 112 (H) 70 - 100 MG/DL    Blood Urea Nitrogen 27 (H) 7 - 25 MG/DL    Creatinine 9.14 0.4 - 1.24 MG/DL    Calcium 7.8 (L) 8.5 - 10.6 MG/DL    Total Protein 5.0 (L) 6.0 - 8.0 G/DL    Total Bilirubin 0.6 0.3 - 1.2 MG/DL    Albumin 2.1 (L) 3.5 - 5.0 G/DL    Alk Phosphatase 36 25 - 110 U/L    AST (SGOT) 23 7 - 40 U/L    CO2 22 21 - 30 MMOL/L ALT (SGPT) 12 7 - 56 U/L    Anion Gap 7 3 - 12    eGFR Non African American >60 >60 mL/min    eGFR African American >60 >60 mL/min   MAGNESIUM    Collection Time: 11/21/16 10:53 PM   Result Value Ref Range    Magnesium 1.9 1.6 - 2.6 mg/dL   PHOSPHORUS    Collection Time: 11/21/16 10:53 PM   Result Value Ref Range    Phosphorus 3.2 2.0 - 4.5 MG/DL   POC GLUCOSE    Collection Time: 11/21/16 10:56 PM   Result Value Ref Range    Glucose, POC 134 (H) 70 - 100 MG/DL   CBC AND DIFF    Collection Time: 11/22/16  5:47 AM   Result Value Ref Range    White Blood Cells 19.3 (H) 4.5 - 11.0 K/UL    RBC 2.91 (L) 4.4 - 5.5 M/UL    Hemoglobin 8.7 (L) 13.5 - 16.5 GM/DL    Hematocrit 78.2 (L) 40 - 50 %    MCV 88.0 80 - 100 FL    MCH 30.1 26 - 34 PG    MCHC 34.2 32.0 - 36.0 G/DL  RDW 15.0 11 - 15 %    Platelet Count 540 (H) 150 - 400 K/UL    MPV 7.5 7 - 11 FL    Neutrophils 88 (H) 41 - 77 %    Lymphocytes 4 (L) 24 - 44 %    Monocytes 8 4 - 12 %    Eosinophils 0 0 - 5 %    Basophils 0 0 - 2 %    Absolute Neutrophil Count 16.80 (H) 1.8 - 7.0 K/UL    Absolute Lymph Count 0.80 (L) 1.0 - 4.8 K/UL    Absolute Monocyte Count 1.50 (H) 0 - 0.80 K/UL    Absolute Eosinophil Count 0.00 0 - 0.45 K/UL    Absolute Basophil Count 0.00 0 - 0.20 K/UL   COMPREHENSIVE METABOLIC PANEL    Collection Time: 11/22/16  5:47 AM   Result Value Ref Range    Sodium 129 (L) 137 - 147 MMOL/L    Potassium 3.9 3.5 - 5.1 MMOL/L    Chloride 98 98 - 110 MMOL/L    Glucose 105 (H) 70 - 100 MG/DL    Blood Urea Nitrogen 30 (H) 7 - 25 MG/DL    Creatinine 4.54 0.4 - 1.24 MG/DL    Calcium 7.6 (L) 8.5 - 10.6 MG/DL    Total Protein 4.4 (L) 6.0 - 8.0 G/DL    Total Bilirubin 0.6 0.3 - 1.2 MG/DL    Albumin 1.8 (L) 3.5 - 5.0 G/DL    Alk Phosphatase 35 25 - 110 U/L    AST (SGOT) 18 7 - 40 U/L    CO2 23 21 - 30 MMOL/L    ALT (SGPT) 11 7 - 56 U/L    Anion Gap 8 3 - 12    eGFR Non African American >60 >60 mL/min    eGFR African American >60 >60 mL/min Point of Care Testing  (Last 24 hours)  Glucose: (!) 105 (11/22/16 0547)  POC Glucose (Download): (!) 134 (11/21/16 2256)    Radiology and other Diagnostics Review:    CXR with improving left sided effusion, external kink of chest tube    Ethelene Browns L. Alfonso Ellis   Pager [phone]

## 2016-11-22 NOTE — Progress Notes
Water level in water seal not rising and falling with pt breathing in chest tube. MPG paged.     0200 orders for chest xray received by MD Samuella Cota notified

## 2016-11-22 NOTE — Progress Notes
2239 Rapid response called for increased oxygen needs.

## 2016-11-22 NOTE — Response Teams
RRT follow up completed at this time. Pt 02 sat 100% on 55% Venti. Continuous pulse ox remains in place. Pt weaned to 45% Venti and appears to be resting comfortably at this time with no complaints. Primary RN voiced concerns regarding minimal CT drainage since midnight. Xray ordered per primary team. No other issues at this time. RRT will sign off

## 2016-11-22 NOTE — Progress Notes
SPEECH-LANGUAGE PATHOLOGY  DAILY TREATMENT NOTE      Patient seen 1x this date. Pt's son, daughter, and brother present for therapy session. Family reports pt has not had any PO since breakfast yesterday, 10/31. Pt reports he has no appetite. Pt's daughter also reported she is brushing pt's dentures. Encouraged pt's daughter to continue brushing pt's dentures but to also ensure pt is cleaning his gums/oral cavity at least 2x/day. Educated pt and pt's family regarding importance of oral care to reduce risk of aspirating bacteria in oral secretions. Pt and pt's family verbalized understanding. Videoswallow completed 13/31/18 showed aspiration w/ thin liquids via straw but no aspiration w/ thin liquids via cup. Pt coughing up thick, tan colored matter and utilizing oral suctioning to remove from oral cavity x3 during tx session. Documentation reflects all daily treatment sessions.    RECOMMENDATIONS:   Regular solids/thin liquids   No straws, slow rate, small bites/sips, sitting upright at 90 degrees  Pills crushed in puree  Excellent oral care to reduce risk of aspirating bacteria in oral secretions  Ongoing dysphagia management     SUMMARY OF THERAPY SESSION:  Goal : Pt will tolerate the LRD w/ less than 10% s/s of aspiration.  Not met  Comment: Pt presented w/ trials of thin via cup and regular solids. Bolus withdraw, formation, and AP transfer appeared functional. Mastication was prolonged but functional. Mild amount of oral residue after the swallow which cleared w/ cued liquid wash. Swallow initiation w/ thin liquids via cup were mildly delayed (2-3 seconds.) Mildly decreased hyolaryngeal elevation as assessed via laryngeal palpation. Pt w/ ~3 second delayed cough w/ 50% of thin liquid trials. Videoswallow completed 13/31/18 showed aspiration w/ thin liquids via straw but no aspiration w/ thin liquids via cup.  Continue to address this goal    PLAN / RECOMMENDATIONS:  Continue treatment 3-5x/week Therapist: Gae Bon, MS, L/CCC-SLP        Office: [phone]  Date: 11/22/2016

## 2016-11-22 NOTE — Patient Education
Medication Education    Darik Massing accepted counseling and was engaged.  he needs reinforcement.    The following medications were discussed:  Zosyn  Coreg  hydralazine    Where indicated, the patient was provided with additional medication and/or disease-state information.  All patient questions were answered and patient acknowledged understanding of the medications, side effects and other pertinent medication information.    Follow up should occur daily.    Continue to address: indications     Pt educated on respiratory status/ oxygen demand, chest tube care, q2t, hypotension, fall bundle, I/o, diet, aspiration risk.     Dorena Bodo

## 2016-11-22 NOTE — Progress Notes
OCCUPATIONAL THERAPY  ASSESSMENT NOTE    Patient Name: Javier Gutierrez                   Room/Bed: ZO1096/04  Admitting Diagnosis:     Mobility  Progressive Mobility Level: Active transfer to chair  Level of Assistance: Assist X1  Assistive Device: Hand Held  Time Tolerated: 11-30 minutes  Activity Limited By: Lydia Guiles / Medical Devices;Weakness    Subjective  Pertinent Dx per Physician: PMH significant for HTN, HLD, CAD, aortic stenosis s/p TAVR, CKD, PAD, lifetime smoker, chronic diastolic heart failure. Admitted 11/20/16 with weakness and cough. Found atelectasis and L PE with chest tube placed.   Precautions: Falls;O2 Requirement (8lpm via venturi mask)  Pain / Complaints: Patient agrees to participate in therapy;Patient demonstrates nonverbal signs of pain (with bed mobility but denied pain)  Pain Location:  Presume chest tube site    Objective  Psychosocial Status: Willing and Cooperative to Participate  Persons Present: Family    Home Living  Type of Home: Apartment (Independent living)  Home Layout: One Level  Financial risk analyst / Tub: Medical sales representative: Standard  Bathroom Equipment: Engineer, materials in [employer] Equipment: Environmental consultant (did not use prior to fall a week ago; began using after fall)    Prior Function  Level Of Independence: Independent with ADLs and functional transfers;Needed assistance with homemaking  Lives With: Alone  Receives Help From: Family  Other Function Comments: ILF does not provide meals. Pt's daughter states he eats out for breakfast and lunch and snacks the rest of the day. He has someone who comes to clean his apartment.    ADL's  Functional Transfer Assist: Moderate Assist  Functional Transfer Deficits: Steadying;Verbal Cueing;Supervision/Safety;Increased Time to Complete  Comment: Bed mobility supine to EOB with moderate assist. Patient stood and took a few steps to sit in bedside chair. He requires cues for safety and technique. SpO2 and BP WNL throughout session. Cognition  Cognition Comment: Pt requires cues for safety and technique and to allow OT to manage his lines for him.    Education  Persons Educated: Patient/Family  Barriers To Learning: Decreased Hearing  Interventions: Increase Volume of Instruction  Teaching Methods: Verbal Instruction;Demonstration  Patient Response: Return Demonstration;More Instruction Required  Topics: Role of OT, Goals for Therapy;DME for Home Discharge;ADL Compensatory Techniques  Goal Formulation: With Patient/Family    Assessment  Assessment: Decreased ADL Status;Decreased Safe/Judg during ADL;Decreased Endurance;Decreased Self-Care Trans  Prognosis: Good;w/Cont OT s/p Acute Discharge  Patient tolerated functional transfer to bedside chair. He demonstrates significant weakness and decreased activity tolerance, so will continue to progress these to increase independence with ADLs and functional mobility.    AM-PAC 6 Clicks Daily Activity Inpatient  Putting on and taking off regular lower body clothes?: A Lot  Bathing (Including washing, rinsing, drying): A Lot  Toileting, which includes using toilet, bedpan, or urinal: Total  Putting on and taking off regular upper body clothing: A Little  Taking care of personal grooming such as brushing teeth: A Little  Eating meals?: None  Daily Activity Raw Score: 15  Standardized (t-scale) score: 34.69  CMS 0-100% Score: 56.46  CMS G Code Modifier: CK    Plan  OT Frequency: 5x/week  OT Plan for Next Visit: LB dressing, progress standing endurance    ADL Goals  Patient Will Perform Grooming: Standing at Sink;w/ Stand By Assist  Patient Will Perform LE Dressing: w/ Stand By Assist  Patient Will Perform Toileting: w/  Stand By Assist    Functional Transfer Goals  Pt Will Perform All Functional Transfers: w/ Stand By Assist    OT Discharge Recommendations  OT Discharge Recommendations: Inpatient Setting  Equipment Recommendations: Too early to be determined    G-Codes: Self-care [phone] Current Status:  40-59% Impairment  [id number] Goal Status:  1-19% Impairment    Based on above evaluation and clinical judgment.    Therapist: Dimas Millin, OTR/L [id number]  Date: 11/22/2016

## 2016-11-22 NOTE — Case Management (ED)
Case Management Progress Note    NAME:Javier Gutierrez                          MRN: [id number]              DOB:03/18/1940          AGE: 76 y.o.  ADMISSION DATE: 11/20/2016             DAYS ADMITTED: LOS: 2 days      Todays Date: 11/22/2016    Plan  DCP Ongoing:    Interventions  ? Support   Support: Pt/Family Updates re:POC or DC Plan  ? Info or Referral      ? Discharge Planning   Discharge Planning: Inpatient Rehabilitation    SW reviewed EMR and discussed POC with team, Pt RR for increased oxygen needs. ICU evaled-no indication for ICU at this time.    SLP consulted- completed video swallow- no straws, solid/liquid thins.   PT recommended IPR.     SW met with pt and dtr at bedside. Dtr tearful and had many questions regarding code status and intervention.   SW updated MD.   SW discussed IPR and provided list. Str provider in healthcare anda ware of IPR facilities.   SW provided contact information, reviewed list with dtr, SW to follow.  ? Medication Needs      ? Financial      ? Legal      ? Other        Disposition  ? Expected Discharge Date    Expected Discharge Date: 11/25/16  ? Transportation   Does the patient need discharge transport arranged?: No  Transportation Name, Phone and Availability #1: Altha Harm Nest--daughter--[phone]  Does the patient use Medicaid Transportation?: No  ? Next Level of Care (Acute Psych discharges only)      ? Discharge Disposition                                          Durable Medical Equipment     No service has been selected for the patient.      [facility]     No service has been selected for the patient.      [place]     No service has been selected for the patient.      [facility]     No service has been selected for the patient.          Modesta Messing, [place]

## 2016-11-22 NOTE — Progress Notes
This RN paged Pulmonary for chest tube only putting out 20ml since this morning. Dr.Greco called back to order RN flushing chest tube to see if it is patent. When RN came back to pt's room, pt already had output in the chest tube cannister. Per family report, this happened after pt's coughing. RN paged Dr. Alfonso Ellis back. Will keep the chest tube water seal and continue to monitor per Dr.Greco order.

## 2016-11-23 ENCOUNTER — Encounter: Admit: 2016-11-23 | Discharge: 2016-11-23 | Payer: MEDICARE

## 2016-11-23 LAB — VANCOMYCIN TROUGH: Lab: 21 ug/mL — ABNORMAL HIGH (ref 10.0–20.0)

## 2016-11-23 LAB — COMPREHENSIVE METABOLIC PANEL: Lab: 131 MMOL/L — ABNORMAL LOW (ref >60)

## 2016-11-23 LAB — CBC AND DIFF: Lab: 19 K/UL — ABNORMAL HIGH (ref >60)

## 2016-11-23 MED ORDER — POLYETHYLENE GLYCOL 3350 17 GRAM PO PWPK
1 | Freq: Two times a day (BID) | ORAL | 0 refills | Status: DC
Start: 2016-11-23 — End: 2016-12-07

## 2016-11-23 MED ORDER — SENNOSIDES-DOCUSATE SODIUM 8.6-50 MG PO TAB
1 | Freq: Two times a day (BID) | ORAL | 0 refills | Status: DC
Start: 2016-11-23 — End: 2016-12-07
  Administered 2016-11-29 – 2016-12-03 (×3): 1 via ORAL

## 2016-11-23 MED ORDER — VANCOMYCIN 1G/250ML D5W IVPB (VIAL2BAG)
1 g | INTRAVENOUS | 0 refills | Status: DC
Start: 2016-11-23 — End: 2016-11-27
  Administered 2016-11-24 – 2016-11-27 (×8): 1000 mg via INTRAVENOUS

## 2016-11-23 NOTE — Progress Notes
PHYSICAL THERAPY  PROGRESS NOTE       MOBILITY:  Mobility  Progressive Mobility Level: Walk in room  Distance Walked (feet): 10 ft  Level of Assistance: Assist X2  Assistive Device: Walker    SUBJECTIVE:  Subjective  Significant hospital events: PMH significant for HTN, HLD, CAD, aortic stenosis s/p TAVR, CKD, PAD, lifetime smoker, chronic diastolic heart failure. Admitted 11/20/16 with weakness and cough. Found atelectasis and L PE with chest tube placed.   Mental / Cognitive Status: Alert;Cooperative;Follows Commands  Persons Present: Family  Pain: Patient has no complaint of pain  Pain Interventions: Patient agrees to participate in therapy  Precautions:  (chest tube to water seal, venti mask at 45% (5L))  Ambulation Assist: Independent Mobility in [employer] with Device  Patient Owned Equipment: Nurse, adult  Home Situation: Lives Alone  Type of Home: Apartment (independent living)  Entry Stairs: No Stairs  In-Home Stairs: No Stairs       BED MOBILITY/TRANSFERS:  Bed Mobility/Transfers  Bed Mobility: Supine to Sit: Moderate Assist;Head of Bed Elevated;Use of Rail;Requires Extra Time;Safety Considerations  Transfer Type: Sit to/from Stand  Transfer: Assistance Level: To/From;Bed;Toilet;Minimal Assist;x2 People  Transfer: Assistive Device: Nurse, adult  Transfers: Type Of Assistance: Verbal Cues;Elevated Bed;For Balance;For Strength Deficit;For Safety Considerations  Other Transfer Type: Stand Pivot  Other Transfer: Assistance Level: To;Bed Side Chair;Minimal Assist;x2 People  Other Transfer: Assistive Device: Nurse, adult  Other Transfer: Type Of Assistance: Verbal Cues;For Strength Deficit;For Safety Considerations;For Balance  End Of Activity Status: Up in Chair;Nursing Notified;Instructed Patient to Request Assist with Mobility;Instructed Patient to Use Call Light (TABS alarm activated, family at bedside)  Comments: encouraged bedside commode for nursing staff to preserve energy levels as able/        GAIT: Gait  Gait Distance: 10 feet (side-step up to chair)  Gait: Assistance Level: Minimal Assist;x2 People  Gait: Assistive Device: Nurse, adult (and Teaching laboratory technician assist)  Gait: Descriptors: Pace: Slow;Forward trunk flexion  Activity Limited By: Complaint of Fatigue  Comments: patient reports no signs of SOA.        ACTIVITY/EXERCISE:  Activity / Exercise  Comments: patient with bowel incontinence with initial sit to stand. assisted patient to bathroom for clean up- main activity, but able to increase mobilization.        ASSESSMENT/PROGRESS:  Assessment/Progress  Impaired Mobility Due To: Decreased Strength;Impaired Balance;Decreased Activity Tolerance;Deconditioning  Assessment/Progress: Should Improve w/ Continued PT  Comments: patient demonstrates generalized deconditioning, but able to progress mobility today. anticipate continued therpay needs to restore function.       AM-PAC 6 Clicks Basic Mobility Inpatient  Turning from your back to your side while in a flat bed without using bed rails: A lot  Moving from lying on your back to sitting on the side of a flatbed without using bedrails : A Lot  Moving to and from a bed to a chair (including a wheelchair): A Little  Standing up from a chair using your arms (e.g. wheelchair, or bedside chair): A Little  To walk in hospital room: A Little  Climbing 3-5 steps with a railing: Total  Raw Score: 14  Standardized (T-scale) Score: 35.55  Basic Mobility CMS 0-100%: 53.86  CMS G Code Modifier for Basic Mobility: CK    GOALS:  Goals  Goal Formulation: With Patient/Family  Time For Goal Achievement: 5 days  Pt Will Go Supine To/From Sit: w/ Stand By Assist  Pt Will Transfer Bed/Chair: w/ Stand By Assist  Pt Will Transfer Sit  to Stand: w/ Stand By Assist  Pt Will Ambulate: Greater than 200 Feet, w/ Dan Humphreys, w/ Stand By Assist    PLAN:  Plan   Treatment Interventions: Mobility Training;Strengthening;Balance Activities;Endurance Training  Plan Frequency: 5 Days per Week PT Plan for Next Visit: increase time out of bed to chair, increase gait distance as tolerated, endurance training    RECOMMENDATIONS:  PT Discharge Recommendations  PT Discharge Recommendations: Inpatient Setting (as patient with generalized deconditioning and lives alone)  Equipment Recommendations: Nurse, adult  Recommend ongoing assistance for: Transfers;Bed mobility;Ambulation;Stairs;Safety concerns;In and out of house    Therapist: Joice Lofts Hanalei Gutierrez  Date: 11/23/2016

## 2016-11-23 NOTE — Patient Education
Medication Education    Celeste Candelas accepted counseling and was receptive.  he needs reinforcement.    The following medications were discussed:  Aspirin  Pletal  Avodart  Synthroid  Singulair  Zosyn      Where indicated, the patient was provided with additional medication and/or disease-state information.  All patient questions were answered and patient acknowledged understanding of the medications, side effects and other pertinent medication information.    Follow up should occur daily.    Continue to address: indications    Carlton Adam, RN

## 2016-11-23 NOTE — Care Plan
Problem: Falls, High Risk of  Goal: Absence of falls-Adult Patient  Outcome: Goal Ongoing  No falls this shift, fall bundle in place    Problem: Discharge Planning  Goal: Knowledge regarding plan of care  Outcome: Goal Ongoing  Pt and daughter at bedside updated on plan of care.     Problem: Infection, Risk of  Goal: Absence of infection  Outcome: Goal Ongoing  Pt has no isolation, Pt has chest tube drain, dressing changed 11/1. Pt educated on infection risk     Problem: Respiratory Impairment (Non-Ventilated Patient)  Goal: Effective gas exchange  Outcome: Goal Ongoing  Pt RR due to increased oxygen need. Pt placed on Venti Mask at 35%. O2 sats to remain above 88%.  Goal: Patent airway  Outcome: Goal Ongoing  Airway open, pt NTS by RT to clear secretions     Problem: Aspiration, Risk of  Goal: Tolerates oral intake  Outcome: Goal Ongoing  Pt difficulty clearing secretions   Goal: Absence of aspiration  Outcome: Goal Ongoing  Pt at risk of aspiration, speech to reevaluate

## 2016-11-23 NOTE — Progress Notes
CLINICAL NUTRITION                                                        Clinical Nutrition Follow-Up Summary     NAME:Javier Gutierrez             MRN: [id number]             DOB:Nov 03, 1940          AGE: 76 y.o.  ADMISSION DATE: 11/20/2016             DAYS ADMITTED: LOS: 3 days    Nutrition Assessment of Patient:  Malnutrition Assessment: Does not meet criteria  Current Oral Intake: Inadequate  Estimated Calorie Needs: 1700-1840 kcal (25-27 kcal/kg present wt 68kg)  Estimated Protein Needs: 82-88g (1.2-1.3g/kg present wt 68kg)  Oral Diet Order: Cardiac  Oral Supplement: Boost Plus, TID    Comments:  76 year old man with history of hypertension, hyperlipidemia, coronary artery disease, aortic stenosis, CKD, PAD, and lifetime smoker who presents with concern for weakness and cough. Per daughter he is reported to have a fall one week ago where he was down for 15 hours and generalized worsening fatigue over the last few weeks. Also with stage I coccyx pressure injury per RN documentation. Pt is being followed by clinical nutrition services for poor intake PTA & throughout admission. He is s/p SLP 10/30 who recommended NPO given concern for aspiration. However, cleared for regular textures 10/31 following VSS. He was very lethargic during visit today; spoke mostly with son who reports trying to get pt to eat is a battle. He has no appetite at all. Did eat a few bites of a peanut butter & jelly sandwich & pudding last night, but has had nothing to eat so far today except a few sips of Boost. Son feels that a feeding tube may be helpful, but that the pt does not want this.     Recommendation:  Continue least restrictive diet textures per SLP. REC liberalizing diet to regular to encourage PO efforts    Encourage intake of Boost Plus (mixed with ice cream, beneprotein) at and between meals    Pt would likely benefit from corpak placement & EN initiation if he becomes agreeable. If corpak placed, REC goal of Isosource 1.5 @ 67mL/hr. H2O bolus Q4hr. Will provide at goal 1800 kcal, 82g protein & free water (EN + water boluses) daily                                Intervention / Plan:  Discussed ways to optimize kcal/protein intake; will order calorie count to better assess intakes  Will monitor PO intake adequacy/tolerance, need for EN feeds  Will monitor GI function, wt trends, labs    Nutrition Diagnosis:  Inadequate oral intake  Etiology: decreased appetite, lethargy  Signs & Symptoms: son report    Goals:  Initiate nutrition  Time Frame: Within 72 Hours  Status: Met;new goal established     PO intake to meet >50% nutritional needs  Time Frame: Within 72 Hours      [place] Luetkemeyer, MS, RD, LD  Pager: [phone]  Phone: [phone]

## 2016-11-23 NOTE — Patient Education
Medication Education    Javier Gutierrez accepted counseling and was engaged. he needs reinforcement.    The following medications were discussed:  Zosyn  Alteplase   dornase    Where indicated, the patient was provided with additional medication and/or disease-state information. All patient questions were answered and patient acknowledged understanding of the medications, side effects and other pertinent medication information.    Follow up should occur daily.    Continue to address: indications     Pt educated on respiratory status/ oxygen demand, chest tube care, q2t, hypotension, fall bundle, I/o, diet, aspiration risk.     Dorena Bodo

## 2016-11-23 NOTE — Progress Notes
Name:  Javier Gutierrez                                               MRN:  [id number]   Admission Date:  11/20/2016  Today's Date: 11/24/16  LOS: 4      ASSESSMENT AND PLAN     76 year old man with history of hypertension, hyperlipidemia, coronary artery disease, aortic stenosis, chronic kidney disease, peripheral artery disease, and lifetime smoker who presents with concern for weakness and cough. ???Per daughter he is reported to have a fall one week ago where he was down for 15 hours and generalized worsening fatigue over the last few weeks.  ???  Complicated parapneumonic effusion  - LDH 293, white count 1400, pH of pleural fluid 7.29, cytology negative for malignant cells  - Reported coughing and weakness over the last few weeks and weight loss over the last year  - CT Chest 11/20/16: Consolidation of the majority of the left mid and lower lung, most compatible with pneumonia and partially loculated pleural effusion. Underlying neoplastic endobronchial lesion possible.  - s/p chest tube placement 11/20/16  - repeat CT 11/24/16 scan showed development of scattered opacities throughout the right lung that is   most suggestive of pneumonia (new), Small, partially loculated left-sided hydropneumothorax with a pleural drain in place. ???The pleural fluid has mildly decreased and the pleural gas has developed since the October 30  - because of now new right sided ground glass opacities as well as up trending WBC, added Levaquin 11/24/16 on top of zosyn and vanc  - urine strep and leg Ag pending, RVP pending  - pulmonary following:   ???  Hypotension - improved  - due to coreg and hydralazine this morning.  - 18ml/kg bolus and held BP meds.   - Will resume Coreg and hydralazine as pt clinically improves.  ???  CAD  - Continue PTA Aspirin/COreg  ???  Recent Mechanical fall  - CT head negative  - CT C spine was suspicious for possible lytic lesions to the c spine  - Consult PT/OT for eval  ???  HLD/HTN - He is of his simvastatin and fenofibrate due to reported muscle weakness since last Thursday  - Continue PTA coreg and hydralazine  ???  Hypothyroidism  - Check TSH/Free T4 normal  - Continue PTA synthroid  ???  Aortic Valve stenosis   - S/P TAVR in 2017  ???  Discussed CT chest findings with radiologist: there are new ground glass opacities on the right new since 11/20/16.    Start on levaquin for atypical coverage    Check urine leg and strep Ag    Check RVP    Taper off oxygen    PT/OT recommneds inpatient setting      SUBJECTIVE   Patient seen and examined.    Overnight:   No interval events.    During this AM visit, patient states feeling better, stil has poor appetite  ROS:  >>> Denies fever/chills            >>> Denies chest pain, SOB            >>> Denies  N/V/D            >>> Denies dizziness/lightheadedness       OBJECTIVE     VITAL SIGNS   BP: 123/47 (11/03 0716)  Temp: 36.9 ???C (98.4 ???F) (11/03 1610)  Pulse: 82 (11/03 0930)  Respirations: 18 PER MINUTE (11/03 0930)  SpO2: 94 % (11/03 0930)  O2 Delivery: None (Room Air) (11/03 0930)       PHYSICAL EXAM   General: ???Alert and response to question appropriately. ???VS as above. No acute distress . ??????  Eyes: No conjunctival injection. ???Pupil equal on both size?????????No scleral icterus. ???  Ears, nose, mouth, and throat : No erythema. ??????MMM. ??????No erythema, exudate noted. ???  Neck: ???Symmetric appearance without crepitus, no obvious mass or noticeable swelling,   Lungs: ???No use of accessory muscles. ???Clear to auscultation bilaterally without wheezes, rales, or rhonchi. Chest tube in place.  Cardiovascular: Regular rate, S1, S2 normal, no murmurs, clicks, rubs, or gallops appreciated . ???2+ and symmetric, all extremities. ???No edema in BLE.   Abdomen: ???BS+, soft, ND, ???no guarding or rigidity. ???Nontender to palpation.  Skin: Skin color normal, no obvious evidence of rashes.   Musculoskeletal: ???Normal 5/5 ???hand-grip strength and ???distal strength in BLE. ???ROM normal. No joints swelling or erythema/tenderness   Neurologic: ???Alerted and oriented x 3. ???5/5 hand-grip and distal strenght. Normal ROM. ???No Sensation grossly intact. ???No focal weakness. ???  Psych:??????Alerted and oriented, calm, normal affect    LAB REVIEW     Recent Labs      11/23/16   0547  11/24/16   0532   WBC  19.6*  20.7*   HGB  8.1*  7.6*   HCT  23.9*  23.0*   PLTCT  566*  563*   MCV  87.0  89.5     Recent Labs      11/21/16   2253   11/23/16   0547  11/24/16   0532   NA  127*   < >  131*  132*   K  3.9   < >  3.6  3.6   CL  98   < >  101  100   CO2  22   < >  25  26   BUN  27*   < >  33*  28*   CR  1.03   < >  1.14  1.00   GAP  7   < >  5  6   MG  1.9   --    --    --    PO4  3.2   --    --    --    GFR  >60   < >  >60  >60   ALBUMIN  2.1*   < >  1.7*  1.6*   TOTBILI  0.6   < >  0.5  0.4   AST  23   < >  14  24   ALT  12   < >  7  11    < > = values in this interval not displayed.       MEDICATIONS   Scheduled Meds:    aspirin EC tablet 81 mg 81 mg Oral QDAY   cilostazol(+) (PLETAL) tablet 50 mg 50 mg Oral BID before meals   dutasteride (AVODART) capsule 0.5 mg 0.5 mg Oral QDAY   enoxaparin (LOVENOX) syringe 40 mg 40 mg Subcutaneous QDAY(21)   levothyroxine (SYNTHROID) tablet 100 mcg 100 mcg Oral QDAY   montelukast (SINGULAIR) tablet 10 mg 10 mg Oral QDAY   piperacillin/tazobactam  (ZOSYN) 4.5 g/100 mL iso-osmotic IVPB 4.5 g Intravenous Q6H*   polyethylene  glycol 3350 (MIRALAX) packet 17 g 1 packet Oral BID   senna/docusate (SENOKOT-S) tablet 1 tablet 1 tablet Oral BID   vancomycin (VANCOCIN) 1,000 mg in dextrose 5% (D5W) 250 mL IVPB (Vial2Bag) 1 g Intravenous Q24H*   Continuous Infusions:  PRN and Respiratory Meds:albuterol Q4H PRN, [DISCONTINUED] vancomycin   IVPB Q12H* **AND** vancomycin, pharmacy to manage Per Pharmacy      RADIOLOGY AND OTHER DIAGNOSTIC PROCEDURES REVIEW   No results found.    Radiology and Other Diagnostic Procedures Review:    11/24/2016        Pertinent radiology reviewed.

## 2016-11-23 NOTE — Progress Notes
Pharmacy Vancomycin Note  Subjective:   Javier Gutierrez is a 76 y.o. male being treated for post-obstructive pneumonia.    Objective:     Current Vancomycin Orders   Medication Dose Route Frequency    [START ON 11/24/2016] vancomycin (VANCOCIN) 1,000 mg in dextrose 5% (D5W) 250 mL IVPB (Vial2Bag)  1 g Intravenous Q24H*    vancomycin, pharmacy to manage  1 each Service Per Pharmacy     Start Date of  Vancomycin therapy: 11/20/2016  Additional Abx: Zosyn  Cultures: 10/30 Blood:NGTD, 10/30 Pleural fluid: NGTD,  ,    White Blood Cells   Date/Time Value Ref Range Status   11/23/2016 0547 19.6 (H) 4.5 - 11.0 K/UL Final   11/22/2016 0547 19.3 (H) 4.5 - 11.0 K/UL Final   11/21/2016 2253 20.4 (H) 4.5 - 11.0 K/UL Final   11/21/2016 0624 15.7 (H) 4.5 - 11.0 K/UL Final   11/20/2016 1315 17.7 (H) 4.5 - 11.0 K/UL Final     Creatinine   Date/Time Value Ref Range Status   11/23/2016 0547 1.14 0.4 - 1.24 MG/DL Final   16/10/9602 5409 1.17 0.4 - 1.24 MG/DL Final   81/19/1478 2956 1.03 0.4 - 1.24 MG/DL Final     Blood Urea Nitrogen   Date/Time Value Ref Range Status   11/23/2016 0547 33 (H) 7 - 25 MG/DL Final     Estimated CrCl: 53 mL/min  Actual Weight:  68 kg (150 lb)    Drug Levels:  Vancomycin Trough   Date/Time Value Ref Range Status   11/23/2016 0549 21.6 (H) 10.0 - 20.0 MCG/ML Final     Assessment:   Target levels for this patient: trough ~15 mcg/mL.  Evaluation of level(s): Vancomycin trough drawn appropriately and patient has significant accumulation since previous level.      Plan:   1. Change Vancomycin to 1000mg  IV Q24hr  2. Next scheduled level(s): 2-3 days  3. Pharmacy will continue to monitor and adjust therapy as needed.    [place], [place]  11/23/2016

## 2016-11-23 NOTE — Case Management (ED)
Case Management Progress Note    NAME:Javier Gutierrez                          MRN: [id number]              DOB:1940/05/24          AGE: 76 y.o.  ADMISSION DATE: 11/20/2016             DAYS ADMITTED: LOS: 3 days      Todays Date: 11/23/2016    Plan  DCP ongoing: SW anticipates pt to DC to [facility] pending facility acceptance and medical stability.    Interventions  ? Support   Support: Pt/Family Updates re:POC or DC Plan    SW reviewed EMR and discussed POC with team, anticipated DC Tuesday.   L Chest tube still in place.   SLP following-rec no straws and thin liquids.   PT/OT following- recommending placement.   Pulm following-pt still on IV Vanc and Zosyn.  ? Info or Referral      ? Discharge Planning   Discharge Planning: Inpatient Rehabilitation    SW f/u with family regarding IPR.   Family reports preference is [facility]. SW explained referral process and also encouraged family to select second choice. Family selected [facility].   SW requested Rehab consult-Pt will need OT for eval.   SW sent referral to: [facility]  ? Medication Needs      ? Financial      ? Legal      ? Other        Disposition  ? Expected Discharge Date    Expected Discharge Date: 11/26/16  ? Transportation   Does the patient need discharge transport arranged?: No  Transportation Name, Phone and Availability #1: [name] [place]--daughter--[phone]  Does the patient use Medicaid Transportation?: No  ? Next Level of Care (Acute Psych discharges only)      ? Discharge Disposition                                          Durable Medical Equipment     No service has been selected for the patient.      [facility]     No service has been selected for the patient.      [facility]     No service has been selected for the patient.      [facility]     No service has been selected for the patient.          Lianne Cure, LMSW *[phone]

## 2016-11-23 NOTE — Consults
Physical Medicine & Rehabilitation Consult Note       Date of Service:  11/23/2016  Javier Gutierrez is a 76 y.o. male.     DOB: 1941-01-07                  MRN#:  [id number]  Primary Insurance: MEDICARE  Secondary Insurance:   Tertiary Insurance:   Financial Class:  Medicare  Date of Admission:  11/20/2016  Referring Physician:  Candelaria Celeste, MD  Reason for Consult: evaluate for Post-Acute Rehab/Placement  Precautions: Fall      Active Problems  Acute respiratory failure  Sepsis  Pnemonia  Pleural effusion  Pulmonic mass  Gait abnormality  Impaired self cares     Assessment & Plan       NUR Javier Gutierrez is a 76 y.o. male admitted to [facility] on 11/20/2016 with the following issues:    Debility 2/2 sepsis, complicated para pneumonic effusion leading to acute respiratory failure.     Impairments: weakness  Activity Limitations:  dressing - upper, dressing - lower, transfers and ambulation  Participation Restrictions: unable to return home safely    Post-acute care rehabilitation needs: Likely acute inpatient rehabilitation pending CT to suction, continued functional progress with rehab therapies.     Goals & Barriers    Family / Patient Goals: return home alone  Mobility Goals: Overall goal is Modified independent  Activities of Daily Living (ADLs) Goals: Overall goal is Modified independent  Cognition / Communication Goals: Cognition grossly intact    Barriers: High burden of care  Facilitators: patient motivation    Rehabilitation Prognosis: Good    Tolerance for three hours of therapy a day: Good    Prior to the inpatient rehabilitation admission complete the following:   CT to suction       PT, OT consulted to address deficits as below     Impaired gait/mobility:  PTA pt was independent at community level without assistive device (independent living)   Currently requiring min assist X 2 for gait (short distance w/ RW) Will benefit from continued work with PT to address mobility deficits     Impaired ADL:  PTA pt was independent  Currently requiring min assist for transfers   Will benefit from ongoing OT to address functional deficits        Thank you for this consultation.  Please call our consult pager with questions or concerns.     Beverly Sessions, MD     Rehab Consult Pager:  [phone]    History of Present Illness     Hospital Course: Mr Current is a 76 y/o male w/ Hx of diastolic CHF, AS s/p TAVR, CAD, CKD, HTN, who presented to [facility] ED on 10/30 with weakness and SOB. He was found on chest CT to have Lt sided lung consolidation concerning for PNA w/ effusion and a suspicious lumg mass. Pulmonolgy was consulted, placed pigtail CT to suction, fluid analysis consistent with complicated pneumonic effusion, CT placed to wall suction (cytology pending). Empiric abx therapies initiated. The patient's stay has been complicated by acute respiratory failure with new oxygen requirement, sepsis, shock.  Pt is working with PT and OT to address functional and mobility deficits, rehab is now consulted for post-acute rehab/placement recommendations.     Past Medical History:   Diagnosis Date   ??? Acute on chronic systolic heart failure, NYHA class 2 (HCC)    ??? Aortic valve stenosis, mild 11/29/2008   ???  Arterial occlusion     left kidney-two stents placed   ??? Arthritis     lower back   ??? Asthma    ??? CAD (coronary artery disease) 11/29/2008   ??? Cardiomyopathy (HCC) 11/29/2008   ??? Carotid artery plaque    ??? Colon polyps    ??? Frailty    ??? 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 HTN    ??? Hyperlipidemia    ??? Hyperlipidemia 11/29/2008   ??? Hypertension 11/29/2008   ??? Hypothyroidism    ??? Tobacco abuse         Past Surgical History:   Procedure Laterality Date   ??? HX LUMBAR DISKECTOMY  1968    30 years ago   ??? STENT INTRAVASCULAR  2006    kidney stent placed ??? PERCUTANEOUS CORONARY INTERVENTION N/A 03/11/2015    Possible Percutaneous Coronary Intervention performed by Marcell Barlow, MD, [facility] at CATH LAB   ??? UPPER GASTROINTESTINAL ENDOSCOPY N/A 04/26/2015    ESOPHAGOGASTRODUODENOSCOPY performed by Tempie Hoist, DO at ENDO/GI   ??? UPPER GASTROINTESTINAL ENDOSCOPY  04/26/2015    ESOPHAGOGASTRODUODENOSCOPY BIOPSY performed by Tempie Hoist, DO at ENDO/GI   ??? AORTIC VALVE REPLACEMENT N/A 08/03/2015    REPLACEMENT TRANSCATHETER AORTIC VALVE (Sapien 26s3), right common femoral artery approach performed by Zella Richer, MD at [facility]        Social History     Social History   ??? Marital status: Widowed     Spouse name: N/A   ??? Number of children: N/A   ??? Years of education: N/A     Occupational History   ??? Not on file.     Social History Main Topics   ??? Smoking status: Current Every Day Smoker     Packs/day: 0.50     Years: 60.00     Types: Cigarettes   ??? Smokeless tobacco: Never Used      Comment: 0.5-2 ppd history   ??? Alcohol use No   ??? Drug use: No   ??? Sexual activity: Not on file     Other Topics Concern   ??? Not on file     Social History Narrative   ??? No narrative on file     Family History   Problem Relation Age of Onset   ??? Stroke Mother    ??? Other Mother         colon cancer   ??? Other Father         valve disease/COPD          Scheduled Meds:  alteplase (ACTIVASE) 10 mg in sodium chloride 0.9% (NS) 30 mL intrapleural syringe 10 mg Intrapleural BID   And      dornase alfa (PULMOZYME) 5 mg in water (sterile) for injection 30 mL intrapleural syringe 5 mg Intrapleural BID   aspirin EC tablet 81 mg 81 mg Oral QDAY   cilostazol(+) (PLETAL) tablet 50 mg 50 mg Oral BID before meals   dutasteride (AVODART) capsule 0.5 mg 0.5 mg Oral QDAY   enoxaparin (LOVENOX) syringe 40 mg 40 mg Subcutaneous QDAY(21)   levothyroxine (SYNTHROID) tablet 100 mcg 100 mcg Oral QDAY   montelukast (SINGULAIR) tablet 10 mg 10 mg Oral QDAY piperacillin/tazobactam  (ZOSYN) 4.5 g/100 mL iso-osmotic IVPB 4.5 g Intravenous Q6H*   polyethylene glycol 3350 (MIRALAX) packet 17 g 1 packet Oral BID   senna/docusate (SENOKOT-S) tablet 1 tablet 1 tablet Oral  BID   [START ON 11/24/2016] vancomycin (VANCOCIN) 1,000 mg in dextrose 5% (D5W) 250 mL IVPB (Vial2Bag) 1 g Intravenous Q24H*   Continuous Infusions:  PRN and Respiratory Meds:albuterol Q4H PRN, [DISCONTINUED] vancomycin   IVPB Q12H* **AND** vancomycin, pharmacy to manage Per Pharmacy       Allergies   Allergen Reactions   ??? Sulfa (Sulfonamide Antibiotics) UNKNOWN       Prior Level of Function     Self-Care/ADLs:  Independent   Mobility: independent at community level w/ assistive device     Home Environment:  Home Situation: Lives Alone (11/23/2016 11:00 AM)  Patient Owned Equipment: Roller Walker (11/23/2016 11:00 AM)  Type of Home: Apartment (independent living) (11/23/2016 11:55 AM)  Entry Stairs: No Stairs (11/23/2016 11:00 AM)  In-Home Stairs: No Stairs (11/23/2016 11:00 AM)  Comments: Patient defers prior level of function information to be obtained from daughter. Daughter reports patient was living at home alone in an independent living facility where he was fully indepenent up until about a week ago when he experienced a fall which resulted in patient being on the ground for an estimated 15 hours. Since then has used a roller walker for mobility. Daughter reports progressive weakness for about the past week leading up to hospitalization. No oxygen used at baseline. (11/21/2016 12:50 PM)  Bathroom Equipment: Grab Bars in Shower (11/23/2016 11:55 AM)  @ADDRESSFULL @    Current Level Of Function:   PT Gait:Gait Distance: 10 feet (side-step up to chair) Gait: Assistance Level: Minimal Assist, x2 People Gait: Assistive Device: Nurse, adult (and line magagement assist)  Bed Mobility/Transfers  Bed Mobility: Supine to Sit: Moderate Assist, Head of Bed Elevated, Use of Rail, Requires Extra Time, Safety Considerations  Comments: Able to scoot forward on edge of bed with standby assist and cues for safety.  Transfer Type: Sit to/from Stand  Transfer: Assistance Level: To/From, Bed, Toilet, Minimal Assist, x2 People  Transfer: Assistive Device: Nurse, adult  Transfers: Type Of Assistance: Verbal Cues, Elevated Bed, For Balance, For Strength Deficit, For Safety Considerations  Other Transfer Type: Stand Pivot  Other Transfer: Assistance Level: To, Bed Side Chair, Minimal Assist, x2 People  Other Transfer: Assistive Device: Nurse, adult  Other Transfer: Type Of Assistance: Verbal Cues, For Strength Deficit, For Safety Considerations, For Balance  End Of Activity Status: Up in Chair, Nursing Notified, Instructed Patient to Request Assist with Mobility, Instructed Patient to Use Call Light (TABS alarm activated, family at bedside)  Comments: encouraged bedside commode for nursing staff to preserve energy levels as able/    OT ADL's  Grooming Assist: Minimal Assist (oral hygiene standing at bedside tray with walker)  Grooming Deficits: Setup, Steadying, Verbal Cueing, Supervision/Safety, Increased Time To Complete, Standing With Assistive Device  LE Dressing Assist: Stand By Assist (doff/don socks while seated in bedside chair)  LE Dressing Deficits: Supervision/Safety, Increased Time To Complete  Functional Transfer Assist: Minimal Assist  Functional Transfer Deficits: Steadying, Verbal Cueing, Supervision/Safety, Increased Time to Complete  Comment: Pt seated in bedside chair on OT arrival. Pt stood to walker for ~3 minutes and completed oral hygiene. After sitting, pt completed LB dressing.   SLP COGNITIVE EVALUATION SUMMARY     PRAGMATICS:    BEHAVIOR:    AUDITORY COMPREHENSION:      ORIENTATION:    AUDITORY ATTENTION/WORKING MEMORY:    AUDITORY MEMORY/SUSTAINED ATTENTION:    NEW LEARNING:    SEQUENCING/ORGANIZATION:    PROBLEM SOLVING:    REASONING:  MATH/MONEY SKILLS: VISUAL PERCEPTUAL:    SWALLOW EVALUATION SUMMARY  Summary: Pt seen for a clinical swallow evaluation. Pt presents w/ moderate-severe oropharyngeal dysphagia suspect given   Oral Stage Summary*: Pt seen w/ thin liquids via teaspoon/cup/straw and nectar via teaspoon/cup. Bolus withdraw from spoon was weak. Bolus formation and AP transfer appeared functional.   Pharyngeal Stage Summary*: Hyolaryngeal elevation appears normal w/ W/ thin via teaspoon small immediate cough upon initial presentation. No overt s/s aspiration w/ additional teaspoon boluses nor w/ cup sips. Large immediate cough present w/ straw. No s/s aspiration w/ nectar via teaspoon however coughs w/ x3 presentations of nectar via cup.     Plan: Continue Treatment __x/week (Comment).  Prognosis: Good     Review of Systems     A 14 point review of systems was negative except for: that noted in the HPI    Physical Exam     BP: 123/42 (11/02 1125)  Temp: 36.8 ???C (98.3 ???F) (11/02 1125)  Pulse: 72 (11/02 1125)  Respirations: 20 PER MINUTE (11/02 1125)  SpO2: 96 % (11/02 1125)  O2 Delivery: Venturi Mask (11/02 1125)  Body mass index is 21.52 kg/m???.     Gen: awake, alert, NAD  HEENT: NCAT, EOMI, MMM, NRB  Neck: Supple and symmetric  Heart: Extremities are well perfused  Lungs: respirations even and non-labored, CT to wall suction  Abdomen: Soft, non-distended  Psych: pleasant mood/appropriate affect  Ext: No c/c/e  MS:     Root Right Left   Elbow Flexion C5 5 5   Wrist Extension C6 5 5   Elbow Extension C7 4 4   Finger Flexion C8 5 5   Finger Abduction T1 5 5   Hip Flexion L2 3+ 3+   Knee Extension L3 4 4   Dorsiflexion L4 4 4   EHL Extension  L5 4 4   Plantarflexion S1 4 4     Neuro:  Cranial Nerves Cranial Nerves 2-12 are grossly intact   DTR's no hyperreflexia   Babinski Downgoing Bilaterally                                   Clonus Negative Bilaterally   Memory/Concentration  grossly intact       Intake/Output Summary (Last 24 hours) at 11/23/16 1420 Last data filed at 11/23/16 1244   Gross per 24 hour   Intake               90 ml   Output             1520 ml   Net            -1430 ml        Hematology:  Lab Results   Component Value Date    HGB 8.1 11/23/2016    HCT 23.9 11/23/2016    PLTCT 566 11/23/2016    WBC 19.6 11/23/2016    NEUT 85 11/23/2016    ANC 16.90 11/23/2016    ALC 1.10 11/23/2016    MONA 8 11/23/2016    AMC 1.50 11/23/2016    ABC 0.10 11/23/2016    MCV 87.0 11/23/2016    MCHC 33.9 11/23/2016    MPV 7.6 11/23/2016    RDW 15.1 11/23/2016   , Coagulation:    Lab Results   Component Value Date    PTT 29.3 08/03/2015    INR 1.1 08/03/2015  and General Chemistry:  Lab Results   Component Value Date    NA 131 11/23/2016    K 3.6 11/23/2016    CL 101 11/23/2016    GAP 5 11/23/2016    BUN 33 11/23/2016    CR 1.14 11/23/2016    GLU 90 11/23/2016    CA 7.5 11/23/2016    ALBUMIN 1.7 11/23/2016    MG 1.9 11/21/2016    TOTBILI 0.5 11/23/2016             Beverly Sessions, MD

## 2016-11-23 NOTE — Progress Notes
OCCUPATIONAL THERAPY  PROGRESS NOTE    Patient Name: Javier Gutierrez                   Room/Bed: GU4403/47  Admitting Diagnosis:     Mobility  Progressive Mobility Level: Stand  Distance Walked (feet): 10 ft  Level of Assistance: Assist X1  Assistive Device: Walker  Time Tolerated: 11-30 minutes  Activity Limited By: Lydia Guiles / Medical Devices;Weakness    Subjective  Pertinent Dx per Physician: PMH significant for HTN, HLD, CAD, aortic stenosis s/p TAVR, CKD, PAD, lifetime smoker, chronic diastolic heart failure. Admitted 11/20/16 with weakness and cough. Found atelectasis and L PE with chest tube placed.   Precautions: Falls;O2 Requirement (5lpm via venturi mask)  Pain / Complaints: Patient has no c/o pain;Patient agrees to participate in therapy    Objective  Psychosocial Status: Willing and Cooperative to Participate  Persons Present: Family    Home Living  Type of Home: Apartment (independent living)  Home Layout: One Level  Financial risk analyst / Tub: Medical sales representative: Standard  Bathroom Equipment: Engineer, materials in [employer] Equipment: Environmental consultant (did not use prior to fall a week ago)    Prior Function  Level Of Independence: Independent with ADLs and functional transfers;Needed assistance with homemaking  Lives With: Alone  Receives Help From: Family  Other Function Comments: ILF does not provide meals. Pt's daughter states he eats out for breakfast and lunch and snacks the rest of the day. He has someone who comes to clean his apartment.    ADL's  Grooming Assist: Minimal Assist (oral hygiene standing at bedside tray with walker)  Grooming Deficits: Setup;Steadying;Verbal Cueing;Supervision/Safety;Increased Time To Complete;Standing With Assistive Device  LE Dressing Assist: Stand By Assist (doff/don socks while seated in bedside chair)  LE Dressing Deficits: Supervision/Safety;Increased Time To Complete  Functional Transfer Assist: Minimal Assist  Functional Transfer Deficits: Steadying;Verbal Cueing;Supervision/Safety;Increased Time to Complete  Comment: Pt seated in bedside chair on OT arrival. Pt stood to walker for ~3 minutes and completed oral hygiene. After sitting, pt completed LB dressing.    Assessment  Assessment: Decreased ADL Status;Decreased Safe/Judg during ADL;Decreased Endurance;Decreased Self-Care Trans  Prognosis: Good;w/Cont OT s/p Acute Discharge  Pt very fatigued today but participates well in therapy and continues to make progress.    AM-PAC 6 Clicks Daily Activity Inpatient  Putting on and taking off regular lower body clothes?: A Lot  Bathing (Including washing, rinsing, drying): A Lot  Toileting, which includes using toilet, bedpan, or urinal: A Lot  Putting on and taking off regular upper body clothing: A Little  Taking care of personal grooming such as brushing teeth: A Little  Eating meals?: None  Daily Activity Raw Score: 16  Standardized (t-scale) score: 35.96  CMS 0-100% Score: 53.32  CMS G Code Modifier: CK    Plan  OT Frequency: 5x/week  OT Plan for Next Visit: toileting    ADL Goals  Patient Will Perform Grooming: Standing at Sink;w/ Stand By Assist  Patient Will Perform LE Dressing: w/ Stand By Assist  Patient Will Perform Toileting: w/ Stand By Assist    Functional Transfer Goals  Pt Will Perform All Functional Transfers: w/ Stand By Assist    OT Discharge Recommendations  OT Discharge Recommendations: Inpatient Setting  Equipment Recommendations: Too early to be determined    Therapist: Dimas Millin, OTR/L [id number]  Date: 11/23/2016

## 2016-11-24 DIAGNOSIS — R05 Cough: Secondary | ICD-10-CM

## 2016-11-24 LAB — RVP VIRAL PANEL PCR

## 2016-11-24 LAB — COMPREHENSIVE METABOLIC PANEL: Lab: 132 MMOL/L — ABNORMAL LOW (ref 137–147)

## 2016-11-24 LAB — LEGIONELLA ANTIGEN URINE,RAN

## 2016-11-24 LAB — CBC AND DIFF: Lab: 20 K/UL — ABNORMAL HIGH (ref >60)

## 2016-11-24 LAB — STREPTOCOCCUS PNEUMO AG, URINE

## 2016-11-24 MED ORDER — LEVOFLOXACIN 250 MG/10 ML PO SOLN
750 mg | ORAL | 0 refills | Status: CP
Start: 2016-11-24 — End: ?
  Administered 2016-11-24 – 2016-11-28 (×5): 750 mg via ORAL

## 2016-11-24 MED ORDER — DORNASE ALPHA 5 MG/STERILE WATER 30ML SYRINGE
5 mg | Freq: Two times a day (BID) | INTRAPLEURAL | 0 refills | Status: CP
Start: 2016-11-24 — End: ?
  Administered 2016-11-25 – 2016-11-27 (×14): 5 mg via INTRAPLEURAL

## 2016-11-24 MED ORDER — ALTEPLASE 10 MG/SODIUM CHLORIDE 0.9% 30ML SYRINGE
10 mg | Freq: Two times a day (BID) | INTRAPLEURAL | 0 refills | Status: CP
Start: 2016-11-24 — End: ?
  Administered 2016-11-25 – 2016-11-27 (×12): 10 mg via INTRAPLEURAL

## 2016-11-24 NOTE — Patient Education
Medication Education    Javier Gutierrez accepted counseling and was receptive.  he verbalized understanding.    The following medications were discussed:  lovenox  Zosyn  senn  miralax      Where indicated, the patient was provided with additional medication and/or disease-state information.  All patient questions were answered and patient acknowledged understanding of the medications, side effects and other pertinent medication information.    Patient educated on plan of care, I&O, pressure ulcer prevention    Follow up should occur daily.    Continue to address: indications    Javier Gutierrez

## 2016-11-25 LAB — CULTURE-WOUND/TISSUE/FLUID(AEROBIC ONLY)W/SENSITIVITY

## 2016-11-25 LAB — CBC AND DIFF: Lab: 19 K/UL — ABNORMAL HIGH (ref 4.5–11.0)

## 2016-11-25 LAB — COMPREHENSIVE METABOLIC PANEL: Lab: 130 MMOL/L — ABNORMAL LOW (ref 137–147)

## 2016-11-25 MED ORDER — CARVEDILOL 12.5 MG PO TAB
12.5 mg | Freq: Two times a day (BID) | ORAL | 0 refills | Status: DC
Start: 2016-11-25 — End: 2016-12-07
  Administered 2016-11-26 – 2016-12-07 (×17): 12.5 mg via ORAL

## 2016-11-25 NOTE — Progress Notes
Name:  Javier Gutierrez                                               MRN:  [id number]   Admission Date:  11/20/2016  Today's Date: 11/25/16  LOS: 5      ASSESSMENT AND PLAN     76 year old man with history of hypertension, hyperlipidemia, coronary artery disease, aortic stenosis, chronic kidney disease, peripheral artery disease, and lifetime smoker who presents with concern for weakness and cough. ???Per daughter he is reported to have a fall one week ago where he was down for 15 hours and generalized worsening fatigue over the last few weeks.  ???  Complicated parapneumonic effusion  - CT Chest 11/20/16: Consolidation of the majority of the left mid and lower lung, most compatible with pneumonia and partially loculated pleural effusion. Underlying neoplastic endobronchial lesion possible.  - LDH 293, white count 1400, pH of pleural fluid 7.29, cytology negative for malignant cells  - pleural fluid culture STREPTOCOCCUS INTERMEDIUS sensitive to PCN  - urine strep and leg Ag pending, RVP negative  - s/p chest tube placement 11/20/16  - repeat CT 11/24/16 scan showed development of scattered opacities throughout the right lung that is   most suggestive of pneumonia (new), Small, partially loculated left-sided hydropneumothorax with a pleural drain in place. ???The pleural fluid has mildly decreased and the pleural gas has developed since the October 30  - because of now new right sided ground glass opacities as well as up trending WBC, added Levaquin 11/24/16 on top of zosyn and vanc  - pulmonary following: Resume intrapleural t-PA/Dornase for 6 more doses, keep CT to -20cm wall suction when not clamped with t-pa or dornase, continue aggressive Pulmonary hygiene    ???  CAD  - Continue PTA Aspirin/COreg  - no chest pain  ???  Recent Mechanical fall  - CT head negative  - CT C spine was suspicious for possible lytic lesions to the c spine  - Consult PT/OT for eval  ???  HLD/HTN - He is of his simvastatin and fenofibrate due to reported muscle weakness since last Thursday  - Continue PTA coreg and hydralazine  ???  Hypothyroidism  - Check TSH/Free T4 normal  - Continue PTA synthroid  ???  Aortic Valve stenosis   - S/P TAVR in 2017  ???  PT/OT recommneds inpatient setting  WBC started to trend down after addition of Levaquin  12 lead ECG to check QT interval, because he is on Levaquin and cilostazol     Total floor/unit time (reviewing and writing notes, examining the patient, reviewing test results etc) spent was 35 minutes of which > 50% was spent in care coordination  and bedside counseling (explaining treatment options, disease processes, laboratory/imaging results, prognosis, risks and benefits of treatment options, medication side effects, importance of compliance with treatment, risk factor reduction, follow up with primary care physician).  SUBJECTIVE   Patient seen and examined.    Overnight:   No interval events.    During this AM visit, patient states feeling better, appetite improving too  ROS:  >>> Denies fever/chills            >>> Denies chest pain, SOB            >>> Denies  N/V/D            >>>  Denies dizziness/lightheadedness       OBJECTIVE     VITAL SIGNS   BP: 143/69 (11/04 0721)  Temp: 37 ???C (98.6 ???F) (11/04 1610)  Pulse: 83 (11/04 0900)  Respirations: 16 PER MINUTE (11/04 0900)  SpO2: 93 % (11/04 0900)  O2 Delivery: None (Room Air) (11/04 9604)       PHYSICAL EXAM   General: ???Alert and response to question appropriately. ???VS as above. No acute distress . ??????  Eyes: No conjunctival injection. ???Pupil equal on both size?????????No scleral icterus. ???  Ears, nose, mouth, and throat : No erythema. ??????MMM. ??????No erythema, exudate noted. ???  Neck: ???Symmetric appearance without crepitus, no obvious mass or noticeable swelling,   Lungs: ???No use of accessory muscles. ???Clear to auscultation bilaterally without wheezes, rales, or rhonchi. Chest tube in place. Cardiovascular: Regular rate, S1, S2 normal, no murmurs, clicks, rubs, or gallops appreciated . ???2+ and symmetric, all extremities. ???No edema in BLE.   Abdomen: ???BS+, soft, ND, ???no guarding or rigidity. ???Nontender to palpation.  Skin: Skin color normal, no obvious evidence of rashes.   Musculoskeletal: ???Normal 5/5 ???hand-grip strength and ???distal strength in BLE. ???ROM normal. No joints swelling or erythema/tenderness   Neurologic: ???Alerted and oriented x 3. ???5/5 hand-grip and distal strenght. Normal ROM. ???No Sensation grossly intact. ???No focal weakness. ???  Psych:??????Alerted and oriented, calm, normal affect    LAB REVIEW     Recent Labs      11/24/16   0532  11/25/16   0528   WBC  20.7*  19.4*   HGB  7.6*  7.5*   HCT  23.0*  22.3*   PLTCT  563*  578*   MCV  89.5  86.6     Recent Labs      11/24/16   0532  11/25/16   0528   NA  132*  130*   K  3.6  3.5   CL  100  100   CO2  26  27   BUN  28*  22   CR  1.00  0.88   GAP  6  3   GFR  >60  >60   ALBUMIN  1.6*  1.6*   TOTBILI  0.4  0.5   AST  24  19   ALT  11  10       MEDICATIONS   Scheduled Meds:    alteplase (ACTIVASE) 10 mg in sodium chloride 0.9% (NS) 30 mL intrapleural syringe 10 mg Intrapleural BID   And      dornase alfa (PULMOZYME) 5 mg in water (sterile) for injection 30 mL intrapleural syringe 5 mg Intrapleural BID   aspirin EC tablet 81 mg 81 mg Oral QDAY   cilostazol(+) (PLETAL) tablet 50 mg 50 mg Oral BID before meals   dutasteride (AVODART) capsule 0.5 mg 0.5 mg Oral QDAY   enoxaparin (LOVENOX) syringe 40 mg 40 mg Subcutaneous QDAY(21)   levoFLOXacin (LEVAQUIN) oral solution 750 mg 750 mg Oral Q24H*   levothyroxine (SYNTHROID) tablet 100 mcg 100 mcg Oral QDAY   montelukast (SINGULAIR) tablet 10 mg 10 mg Oral QDAY   piperacillin/tazobactam  (ZOSYN) 4.5 g/100 mL iso-osmotic IVPB 4.5 g Intravenous Q6H*   polyethylene glycol 3350 (MIRALAX) packet 17 g 1 packet Oral BID   senna/docusate (SENOKOT-S) tablet 1 tablet 1 tablet Oral BID vancomycin (VANCOCIN) 1,000 mg in dextrose 5% (D5W) 250 mL IVPB (Vial2Bag) 1 g Intravenous Q24H*   Continuous Infusions:  PRN and Respiratory Meds:albuterol  Q4H PRN, [DISCONTINUED] vancomycin   IVPB Q12H* **AND** vancomycin, pharmacy to manage Per Pharmacy      RADIOLOGY AND OTHER DIAGNOSTIC PROCEDURES REVIEW   No results found.    Radiology and Other Diagnostic Procedures Review:    11/25/2016        Pertinent radiology reviewed.

## 2016-11-25 NOTE — Patient Education
Medication Education    Javier Gutierrez accepted counseling and was receptive. he verbalized understanding.    The following medications were discussed:  lovenox  Zosyn  senn  miralax      Where indicated, the patient was provided with additional medication and/or disease-state information. All patient questions were answered and patient acknowledged understanding of the medications, side effects and other pertinent medication information.    Patient educated on plan of care, I&O, pressure ulcer prevention , chest tube maintenance     Follow up should occur daily.    Continue to address: indications    Javier Gutierrez

## 2016-11-26 ENCOUNTER — Encounter: Admit: 2016-11-26 | Discharge: 2016-11-26 | Payer: MEDICARE

## 2016-11-26 ENCOUNTER — Inpatient Hospital Stay: Admit: 2016-11-21 | Discharge: 2016-11-21 | Payer: MEDICARE

## 2016-11-26 DIAGNOSIS — R05 Cough: Secondary | ICD-10-CM

## 2016-11-26 LAB — CULTURE-BLOOD W/SENSITIVITY

## 2016-11-26 LAB — COMPREHENSIVE METABOLIC PANEL: Lab: 130 MMOL/L — ABNORMAL LOW (ref >60)

## 2016-11-26 NOTE — Progress Notes
Pulmonary Progress Note      Admission Date: 11/20/2016  LOS: 6 days                     Assessment/Plan:    Principal Problem:    Parapneumonic effusion  Active Problems:    Tobacco use disorder    CAD (coronary artery disease)    Chronic diastolic (congestive) heart failure (HCC)    Pneumonia    Sepsis (HCC)    Acute respiratory failure with hypoxia (HCC)    Hyponatremia      Javier Gutierrez is a 76 y.o. male admitted for progressive weakness and cough. Patient was found to have left sided pleural effusion and pneumonia.   He had a chest tube placed for complicated parapneumonic effusion on 11/20/16. Right now, his pleural fluid grew Streptococcus intermedius which is an oral pathogen.   He is not having any fevers but his WBC is still elevated.   Patient is being treated with tPA-Dornase (2nd round), tomorrow is last day. His 66fr CT output was 580cc last 24hours.   Will repeat CXR tomorrow to assess status and may need repeat CT as well  He has developed some GG changes on the right with trace pleural effusion as well. We have to keep monitoring these.   Patient was seen by speech and was not noted to be aspirating, although this organism is highly suspicious for aspiration. 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 Rondel Oh. Please see his/her attestation for updates to recommendations.  ________________________________________________________________________    Subjective  Patient appears to be improved  His family states that he is better than last week  Has some weak cough  Not very active    ROS:  Weakness  No fevers or chills    Medications  Scheduled Meds:  alteplase (ACTIVASE) 10 mg in sodium chloride 0.9% (NS) 30 mL intrapleural syringe 10 mg Intrapleural BID   And      dornase alfa (PULMOZYME) 5 mg in water (sterile) for injection 30 mL intrapleural syringe 5 mg Intrapleural BID   aspirin EC tablet 81 mg 81 mg Oral QDAY   carvedilol (COREG) tablet 12.5 mg 12.5 mg Oral BID   cilostazol(+) (PLETAL) tablet 50 mg 50 mg Oral BID before meals   dutasteride (AVODART) capsule 0.5 mg 0.5 mg Oral QDAY   enoxaparin (LOVENOX) syringe 40 mg 40 mg Subcutaneous QDAY(21)   levoFLOXacin (LEVAQUIN) oral solution 750 mg 750 mg  Oral Q24H*   levothyroxine (SYNTHROID) tablet 100 mcg 100 mcg Oral QDAY   montelukast (SINGULAIR) tablet 10 mg 10 mg Oral QDAY   piperacillin/tazobactam  (ZOSYN) 4.5 g/100 mL iso-osmotic IVPB 4.5 g Intravenous Q6H*   polyethylene glycol 3350 (MIRALAX) packet 17 g 1 packet Oral BID   senna/docusate (SENOKOT-S) tablet 1 tablet 1 tablet Oral BID   vancomycin (VANCOCIN) 1,000 mg in dextrose 5% (D5W) 250 mL IVPB (Vial2Bag) 1 g Intravenous Q24H*   Continuous Infusions:  PRN and Respiratory Meds:albuterol Q4H PRN, [DISCONTINUED] vancomycin   IVPB Q12H* **AND** vancomycin, pharmacy to manage Per Pharmacy        Objective Vital Signs: Last Filed                 Vital Signs: 24 Hour Range   BP: 123/40 (11/05 1100)  Temp: 36.7 ???C (98.1 ???F) (11/05 1100)  Pulse: 77 (11/05 1100)  Respirations: 16 PER MINUTE (11/05 1100)  SpO2: 93 % (11/05 1100)  O2 Delivery: None (Room Air) (11/05 1100) BP: (122-145)/(40-63)   Temp:  [36.6 ???C (97.8 ???F)-37.1 ???C (98.7 ???F)]   Pulse:  [75-88]   Respirations:  [16 PER MINUTE-18 PER MINUTE]   SpO2:  [93 %-98 %]   O2 Delivery: None (Room Air)     Vitals:    11/20/16 0518   Weight: 68 kg (150 lb)       Intake/Output Summary:  (Last 24 hours)    Intake/Output Summary (Last 24 hours) at 11/26/16 1214  Last data filed at 11/26/16 0700   Gross per 24 hour   Intake              200 ml   Output              370 ml   Net             -170 ml      Stool Occurrence: 1    Physical Exam  Gen: Alert, NAD.  HEENT: Normocephalic, atraumatic. EOMI, no oral lesions.  RESP: diminished with decreased BS at left and right base, equal expansion BL, no accessory muscle use, normal WOB.  CVS: RRR, no obvious murmurs, rubs or gallops. No JVD.  ABD: Soft, non-tender, non-distended, BS+, no palpable hepatosplenomegaly.   Ext: No rashes, no edema, no joint swelling, no cyanosis  Neuro: Alert and oriented, fluent speech, symmetric face  Psych: Mood stable.  Skin: No rashes on exposed skin. Warm and dry.      Lab Review  Results for orders placed or performed during the hospital encounter of 11/20/16 (from the past 24 hour(s))   COMPREHENSIVE METABOLIC PANEL    Collection Time: 11/26/16  6:54 AM   # # Low-High    Sodium 130 (L) 137 - 147 MMOL/L    Potassium 3.9 3.5 - 5.1 MMOL/L    Chloride 100 98 - 110 MMOL/L    Glucose 92 70 - 100 MG/DL    Blood Urea Nitrogen 18 7 - 25 MG/DL    Creatinine 1.61 0.4 - 1.24 MG/DL    Calcium 7.4 (L) 8.5 - 10.6 MG/DL    Total Protein 4.7 (L) 6.0 - 8.0 G/DL    Total Bilirubin 0.4 0.3 - 1.2 MG/DL    Albumin 1.8 (L) 3.5 - 5.0 G/DL    Alk Phosphatase 40 25 - 110 U/L    AST (SGOT) 21 7 - 40 U/L CO2 24 21 - 30 MMOL/L    ALT (SGPT) 8 7 -  56 U/L    Anion Gap 6 3 - 12    eGFR Non African American >60 >60 mL/min    eGFR African American >60 >60 mL/min        Point of Care Testing  (Last 24 hours)  Glucose: 92  POC Glucose (Download): (!) 134    Radiology and other Diagnostics Review:  I have personally reviewed the radiology results of the last 24 hours and looked at chest xray and ct findings

## 2016-11-26 NOTE — Progress Notes
OCCUPATIONAL THERAPY  NO TREATMENT NOTE     On OT approach, transport in the room to take patient for test/procedure. Occupational therapy will continue to follow and provide intervention as indicated.    Therapist: Dimas Millin, OTR/L [id number]  Date: 11/26/2016

## 2016-11-26 NOTE — Progress Notes
SPEECH-LANGUAGE PATHOLOGY  DAILY TREATMENT NOTE    Patient seen 1x this date.     SUMMARY OF THERAPY SESSION:  Pt's sonpresent for therapy session. Family reports pt has had limited PO given poor appetite. Spoke w/ pt's RN who reported pt tolerated pills w/o overt s/s of aspiration.    RECOMMENDATIONS:   Regular solids/thin liquids   No straws, slow rate, small bites/sips, sitting upright at 90 degrees  Pills as tolerated   Excellent oral care to reduce risk of aspirating bacteria in oral secretions at least 2x/day    GOAL:  Goal : Pt will tolerate the LRD w/ less than 10% s/s of aspiration.  Met  Comment: Pt presented w/ trials of thin via cup. Bolus withdraw, formation, and AP transfer appeared functional. No oral residue. Swallow initiation w/ thin liquids via cup were mildly delayed (2-3 seconds.) Mildly decreased hyolaryngeal elevation as assessed via laryngeal palpation. Pt w/ 1 cough across 8 oz of thin liquids total. Question if cough was a s/s aspiration given cough in absence of PO and pt's poor respiratory status.  Discharge this goal as met    PLAN / RECOMMENDATIONS:  Anticipate pt is likely at or near his baseline. Will d/c ST at this time. Please reconsult if needed.     Therapist: Reece Levy, [place], L/CCC-SLP, [phone]  Date:

## 2016-11-26 NOTE — Patient Education
Medication Education    ThomasMunsenacceptedcounseling and wasreceptive.heverbalized understanding.    The following medications were discussed:  lovenox  Zosyn  senna  miralax      Where indicated, the patientwas provided with additional medication and/or disease-state information.All patient questions were answered and patient acknowledged understanding of the medications, side effects and other pertinent medication information.    Patient educated on plan of care, I&O, pressure ulcer prevention , chest tube maintenance     Follow upshould occur daily.    Continue to address: indications    Javier Gutierrez

## 2016-11-26 NOTE — Case Management (ED)
Notified by Linus Galas. (SW) that the patient is anticipated to be ready for discharge tomorrow vs next day, pending stability and would prefer to stay at [facility] IP rehab unit. [facility] will review chart for potential rehab admission.     1608 - Per chart review, patient will need to have updated PT and OT notes for further review. Per physician progress notes from today, concerns for large amount of output in CT in past few days and may need further intervention. [facility] will await for further clarification on pending procedures and anticipated dc timeline.      E., Inpatient Admissions Nurse/Rehab. (office# [id number] or voalte# [phone]).

## 2016-11-26 NOTE — Patient Education
Medication Education    Tasheem Elms accepted counseling and was receptive.  he verbalized understanding.    The following medications were discussed:    High fall bundle  Q2t repositioning  Nutritional intake  Aspirin  Coreg  Pletal  Avodart  Synthroid  Singulair  Zosyn  Vancocin    Where indicated, the patient was provided with additional medication and/or disease-state information.  All patient questions were answered and patient acknowledged understanding of the medications, side effects and other pertinent medication information.    Follow up should occur daily.    Continue to address: indications    Dorathy Kinsman, RN

## 2016-11-27 LAB — GRAM STAIN

## 2016-11-27 LAB — CBC AND DIFF: Lab: 14 K/UL — ABNORMAL HIGH (ref >60)

## 2016-11-27 LAB — C DIFFICILE BY PCR: Lab: NEGATIVE

## 2016-11-27 LAB — COMPREHENSIVE METABOLIC PANEL: Lab: 130 MMOL/L — ABNORMAL LOW (ref 137–147)

## 2016-11-27 LAB — VANCOMYCIN TROUGH: Lab: 11 ug/mL (ref 10.0–20.0)

## 2016-11-27 LAB — HEMOGLOBIN & HEMATOCRIT
Lab: 21 % — ABNORMAL LOW (ref 40–50)
Lab: 7 g/dL — ABNORMAL LOW (ref 13.5–16.5)

## 2016-11-27 LAB — PROCALCITONIN: Lab: 0.1 ng/mL — ABNORMAL HIGH (ref <0.10)

## 2016-11-27 LAB — HIV 1& 2 AG-AB SCRN W REFLEX HIV 1 PCR QUANT: Lab: NEGATIVE MMOL/L — ABNORMAL LOW (ref 98–110)

## 2016-11-27 LAB — IRON + BINDING CAPACITY + %SAT+ FERRITIN: Lab: 22 ug/dL — ABNORMAL LOW (ref >60)

## 2016-11-27 MED ORDER — VANCOMYCIN 1,250 MG IVPB
1250 mg | INTRAVENOUS | 0 refills | Status: DC
Start: 2016-11-27 — End: 2016-12-04
  Administered 2016-11-28 – 2016-12-04 (×14): 1250 mg via INTRAVENOUS

## 2016-11-27 NOTE — Progress Notes
Cardiothoracic Surgery Daily Progress Note      A: 76 y.o. male with HTN, HLD, CAD, AS s/p TAVR, CKD, PAD admitted with pneumonia and left parapneumonic effusion s/p intrapleural lytics      P:  -No surgical intervention indicated at this time  -Left CT with 275 mL serosanguineous output in past 24 hours. Drain management per pulmonology  -Antibiotic management and remainder of care per primary team  -CTS will follow peripherally and be available as needed. Please call with any questions or concerns    S:  NAEON. Slept well. Denies fever, chills, chest pain, or shortness of breath.     O:  BP: (104-141)/(38-65)   Temp:  [36.7 C (98.1 F)-36.9 C (98.5 F)]   Pulse:  [75-83]   Respirations:  [16 PER MINUTE-18 PER MINUTE]   SpO2:  [93 %-97 %]   O2 Delivery: None (Room Air)  Lab Results   Component Value Date/Time    HGB 6.9 (L) 11/27/2016 0543    WBC 14.8 (H) 11/27/2016 0543     Lab Results   Component Value Date/Time    NA 130 (L) 11/27/2016 0543    K 3.6 11/27/2016 0543    CL 99 11/27/2016 0543    CO2 26 11/27/2016 0543    BUN 15 11/27/2016 0543    CR 0.86 11/27/2016 0543       PE:    GEN: alert and oriented, in no acute distress  HEENT: NCAT, EOMI  CARDIO: RRR, peripheral pulses wnl  PULM: normal work of breathing, no stridor, on RA, left CT with drainage, no air leak  ABD: soft, NTND  EXT: no c/c/e, warm, well-perfused  NEURO: motor and sensation grossly intact  INTEG: warm, dry    Alonna MiniumAmanda , DO  Pager: [phone]    Principal Problem:    Parapneumonic effusion  Active Problems:    Tobacco use disorder    CAD (coronary artery disease)    Chronic diastolic (congestive) heart failure (HCC)    Pneumonia    Sepsis (HCC)    Acute respiratory failure with hypoxia (HCC)    Hyponatremia

## 2016-11-27 NOTE — Progress Notes
Pt left with transport to [facility] via wheelchair

## 2016-11-27 NOTE — Progress Notes
Pulmonary Progress Note      Admission Date: 11/20/2016  LOS: 7 days                     Assessment/Plan:    Principal Problem:    Parapneumonic effusion  Active Problems:    Tobacco use disorder    CAD (coronary artery disease)    Chronic diastolic (congestive) heart failure (HCC)    Pneumonia    Sepsis (HCC)    Acute respiratory failure with hypoxia (HCC)    Hyponatremia      Javier Gutierrez is a 76 y.o. male admitted for progressive weakness and cough. Patient was found to have left sided pleural effusion and pneumonia.   He had a chest tube placed for complicated parapneumonic effusion on 11/20/16. Right now, his pleural fluid grew Streptococcus intermedius which is an oral pathogen.   He is not having any fevers and his wbc is coming down.  CXR reviewed with persistent LLL changes and effusion. Lung volumes little better.  His 79fr CT output was 275cc last 24hours.   He has developed some GG changes on the right with trace pleural effusion as well. We have to keep monitoring these.   Patient was seen by speech and was not noted to be aspirating, although this organism is highly suspicious for aspiration. The other less likely possibility is endocarditis with septic emboli causing b/l changes but less likely without bacteremia   Patient overall is stable but still with significant chest tube output  CT surgery has evaluated him and there is no plan for surgical intervention at this point.    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  - s/p 2 rounds of intrapleural t-PA/Dornase  - keep CT to -20cm wall suction when not clamped with t-pa or dornase  - Continue aggressive Pulmonary hygiene (IS, flutter valve, PT/OT, up and out of bed when able)      M.Wynelle Beckmann, MD  Pulmonary and Critical Care Fellow  Pager [phone]    Patient was seen and discussed w/ Dr. Rondel Oh. Please see his/her attestation for updates to recommendations.  ________________________________________________________________________    Subjective  Patient appears to be improved  No new complaints  Has some weak cough  Not very active  Up to chair today    ROS:  Weakness  No fevers or chills    Medications  Scheduled Meds:    aspirin EC tablet 81 mg 81 mg Oral QDAY   carvedilol (COREG) tablet 12.5 mg 12.5 mg Oral BID   cilostazol(+) (PLETAL) tablet 50 mg 50 mg Oral BID before meals   dutasteride (AVODART) capsule 0.5 mg 0.5 mg Oral QDAY   enoxaparin (LOVENOX) syringe 40 mg 40 mg Subcutaneous QDAY(21)   levoFLOXacin (LEVAQUIN) oral solution 750 mg 750 mg Oral Q24H*   levothyroxine (SYNTHROID) tablet 100 mcg 100 mcg Oral QDAY   montelukast (SINGULAIR) tablet 10 mg 10 mg Oral QDAY   piperacillin/tazobactam  (ZOSYN) 4.5  g/100 mL iso-osmotic IVPB 4.5 g Intravenous Q6H*   polyethylene glycol 3350 (MIRALAX) packet 17 g 1 packet Oral BID   senna/docusate (SENOKOT-S) tablet 1 tablet 1 tablet Oral BID   [START ON 11/28/2016] vancomycin (VANCOCIN) 1,250 mg in dextrose 5% (D5W) IVPB 1,250 mg Intravenous Q24H*   Continuous Infusions:  PRN and Respiratory Meds:albuterol Q4H PRN, [DISCONTINUED] vancomycin   IVPB Q12H* **AND** vancomycin, pharmacy to manage Per Pharmacy        Objective                       Vital Signs: Last Filed                 Vital Signs: 24 Hour Range   BP: 128/47 (11/06 1115)  Temp: 36.9 ???C (98.4 ???F) (11/06 0758)  Pulse: 78 (11/06 0758)  Respirations: 16 PER MINUTE (11/06 0758)  SpO2: 96 % (11/06 1115) O2 Delivery: None (Room Air) (11/06 0758)  SpO2 Pulse: 74 (11/06 1115) BP: (107-137)/(38-65)   Temp:  [36.8 ???C (98.2 ???F)-36.9 ???C (98.4 ???F)]   Pulse:  [75-83]   Respirations:  [16 PER MINUTE-18 PER MINUTE]   SpO2:  [95 %-99 %]   O2 Delivery: None (Room Air)     Vitals:    11/20/16 0518   Weight: 68 kg (150 lb)       Intake/Output Summary:  (Last 24 hours)    Intake/Output Summary (Last 24 hours) at 11/27/16 1303  Last data filed at 11/27/16 1114   Gross per 24 hour   Intake              777 ml   Output             1163 ml   Net             -386 ml      Stool Occurrence: 1    Physical Exam  Gen: Alert, NAD.  HEENT: Normocephalic, atraumatic. EOMI, no oral lesions.  RESP: diminished with decreased BS at left and right base, left sided chest tube,  no accessory muscle use, normal WOB.  CVS: RRR, no obvious murmurs, rubs or gallops. No JVD.  ABD: Soft, non-tender, non-distended, BS+  Ext: No rashes, no edema, no cyanosis  Neuro: Alert and oriented, fluent speech, symmetric face  Psych: Mood stable.  Skin: No rashes on exposed skin. Warm and dry.      Lab Review  Results for orders placed or performed during the hospital encounter of 11/20/16 (from the past 24 hour(s))   C DIFFICILE BY PCR    Collection Time: 11/26/16  5:26 PM   # # Low-High    Battery Name C DIFFICILE PCR     Specimen Description FECES     Special Requests NONE     C. Difficile Toxin B PCR NEGATIVE-wait 7 days to repeat test     Report Status FINAL  11/27/2016      CBC AND DIFF    Collection Time: 11/27/16  5:43 AM   # # Low-High    White Blood Cells 14.8 (H) 4.5 - 11.0 K/UL    RBC 2.34 (L) 4.4 - 5.5 M/UL    Hemoglobin 6.9 (L) 13.5 - 16.5 GM/DL    Hematocrit 32.4 (L) 40 - 50 %    MCV 88.3 80 - 100 FL    MCH 29.5 26 - 34 PG    MCHC 33.4 32.0 - 36.0 G/DL  RDW 16.0 (H) 11 - 15 %    Platelet Count 586 (H) 150 - 400 K/UL    MPV 7.0 7 - 11 FL    Neutrophils 82 (H) 41 - 77 %    Lymphocytes 9 (L) 24 - 44 %    Monocytes 8 4 - 12 %    Eosinophils 1 0 - 5 % Basophils 0 0 - 2 %    Absolute Neutrophil Count 12.10 (H) 1.8 - 7.0 K/UL    Absolute Lymph Count 1.30 1.0 - 4.8 K/UL    Absolute Monocyte Count 1.20 (H) 0 - 0.80 K/UL    Absolute Eosinophil Count 0.20 0 - 0.45 K/UL    Absolute Basophil Count 0.00 0 - 0.20 K/UL   COMPREHENSIVE METABOLIC PANEL    Collection Time: 11/27/16  5:43 AM   # # Low-High    Sodium 130 (L) 137 - 147 MMOL/L    Potassium 3.6 3.5 - 5.1 MMOL/L    Chloride 99 98 - 110 MMOL/L    Glucose 90 70 - 100 MG/DL    Blood Urea Nitrogen 15 7 - 25 MG/DL    Creatinine 1.61 0.4 - 1.24 MG/DL    Calcium 7.4 (L) 8.5 - 10.6 MG/DL    Total Protein 4.5 (L) 6.0 - 8.0 G/DL    Total Bilirubin 0.3 0.3 - 1.2 MG/DL    Albumin 1.6 (L) 3.5 - 5.0 G/DL    Alk Phosphatase 36 25 - 110 U/L    AST (SGOT) 17 7 - 40 U/L    CO2 26 21 - 30 MMOL/L    ALT (SGPT) 8 7 - 56 U/L    Anion Gap 5 3 - 12    eGFR Non African American >60 >60 mL/min    eGFR African American >60 >60 mL/min   PROCALCITONIN    Collection Time: 11/27/16  5:43 AM   # # Low-High    Procalcitonin 0.12 (H) <0.10 NG/ML   IRON + BINDING CAPACITY + %SAT+ FERRITIN    Collection Time: 11/27/16  5:43 AM   # # Low-High    Iron 22 (L) 50 - 185 MCG/DL    Iron Binding-TIBC 096 (L) 270 - 380 MCG/DL    % Saturation 13 (L) 28 - 42 %    Ferritin 218 30 - 300 NG/ML   HIV-1/2 ANTIGEN/ANTIBODY SCREEN    Collection Time: 11/27/16  5:43 AM   # # Low-High    HIV 1 and 2 AG AB Screen NEG NEG-NEG   HEMOGLOBIN & HEMATOCRIT    Collection Time: 11/27/16  8:30 AM   # # Low-High    Hemoglobin 7.0 (L) 13.5 - 16.5 GM/DL    Hematocrit 04.5 (L) 40 - 50 %   VANCOMYCIN TROUGH    Collection Time: 11/27/16  8:45 AM   # # Low-High    Vancomycin Trough 11.3 10.0 - 20.0 MCG/ML        Point of Care Testing  (Last 24 hours)  Glucose: 90  POC Glucose (Download): (!) 134    Radiology and other Diagnostics Review:  I have personally reviewed the radiology results of the last 24 hours and looked at chest xray and ct findings

## 2016-11-27 NOTE — Progress Notes
OCCUPATIONAL THERAPY  PROGRESS NOTE    Patient Name: Javier Gutierrez                   Room/Bed: WG9562/13  Admitting Diagnosis:     Mobility  Progressive Mobility Level: Walk in room  Distance Walked (feet): 25 ft  Level of Assistance: Assist X1  Assistive Device: Walker  Time Tolerated: 11-30 minutes  Activity Limited By: Weakness    Subjective  Pertinent Dx per Physician: PMH significant for HTN, HLD, CAD, aortic stenosis s/p TAVR, CKD, PAD, lifetime smoker, chronic diastolic heart failure. Admitted 11/20/16 with weakness and cough. Found atelectasis and L PE with chest tube placed.   Precautions: Falls  Pain / Complaints: Patient has no c/o pain;Patient agrees to participate in therapy    Objective  Psychosocial Status: Willing and Cooperative to Participate  Persons Present: Son    Home Living  Type of Home: Apartment (independent living)  Home Layout: One Level  Financial risk analyst / Tub: Medical sales representative: Midwife: Engineer, materials in [employer] Equipment: Environmental consultant (did not use prior to fall a week ago)    Prior Function  Level Of Independence: Independent with ADLs and functional transfers;Needed assistance with homemaking  Lives With: Alone  Receives Help From: Family  Other Function Comments: ILF does not provide meals. Pt's daughter states he eats out for breakfast and lunch and snacks the rest of the day. He has someone who comes to clean his apartment.    ADL's  Where Assessed: Standing at [facility];In Bathroom  Grooming Assist: Minimal Assist (Hand hygiene standing at the sink)  Grooming Deficits: Setup;Steadying;Verbal Cueing;Supervision/Safety;Increased Time To Complete;Standing With Assistive Device  Toileting Assist: Moderate Assist  Toileting Deficits: Steadying;Verbal Cueing;Supervision/Safety;Increased Time To Complete;Grab Bar Use;Perineal Hygiene (to ensure cleanliness)  Functional Transfer Assist: Minimal Assist  Functional Transfer Deficits: Steadying;Verbal Cueing;Supervision/Safety;Increased Time to Complete  Comment: Bed mobility supine to EOB with moderate assist. He stood to walker and ambulated to the bathroom for toileting and hand hygiene, then sat in bedside chair.    Cognition  Comprehension: Hard of Hearing    Assessment  Assessment: Decreased ADL Status;Decreased Safe/Judg during ADL;Decreased Endurance;Decreased Self-Care Trans  Prognosis: Good;w/Cont OT s/p Acute Discharge  Patient's mobility has improved since last session. He continues to present with weakness so would continue to benefit from intensive OT/PT to return patient to prior level of function.    AM-PAC 6 Clicks Daily Activity Inpatient  Putting on and taking off regular lower body clothes?: A Lot  Bathing (Including washing, rinsing, drying): A Lot  Toileting, which includes using toilet, bedpan, or urinal: A Lot  Putting on and taking off regular upper body clothing: A Little  Taking care of personal grooming such as brushing teeth: A Little  Eating meals?: None  Daily Activity Raw Score: 16  Standardized (t-scale) score: 35.96  CMS 0-100% Score: 53.32  CMS G Code Modifier: CK    Plan  OT Frequency: 5x/week  OT Plan for Next Visit: grooming at the sink, LB dressing (pants)    ADL Goals  Patient Will Perform Grooming: Standing at Sink;w/ Stand By Assist  Patient Will Perform LE Dressing: w/ Stand By Assist  Patient Will Perform Toileting: w/ Stand By Assist    Functional Transfer Goals  Pt Will Perform All Functional Transfers: w/ Stand By Assist     OT Discharge Recommendations  OT Discharge Recommendations: Inpatient Setting  Equipment Recommendations: Too early to be  determined    Therapist: Dimas Millin, OTR/L [id number]  Date: 11/27/2016

## 2016-11-27 NOTE — Patient Education
Medication Education    ThomasMunsenacceptedcounseling and wasreceptive.heverbalized understanding.    The following medications were discussed:  lovenox  Zosyn  senna  miralax  Coreg        Where indicated, the patientwas provided with additional medication and/or disease-state information.All patient questions were answered and patient acknowledged understanding of the medications, side effects and other pertinent medication information.    Patient educated on plan of care, I&O, pressure ulcer prevention , chest tube maintenance    Follow upshould occur daily.    Continue to address: indications    Javier HalstedAlexandria Gutierrez

## 2016-11-27 NOTE — Progress Notes
Pharmacy Vancomycin Note  Subjective:   Javier Gutierrez is a 76 y.o. male being treated for post-obstructive pneumonia.    Objective:     Current Vancomycin Orders   Medication Dose Route Frequency    [START ON 11/28/2016] vancomycin (VANCOCIN) 1,250 mg in dextrose 5% (D5W) IVPB  1,250 mg Intravenous Q24H*    vancomycin, pharmacy to manage  1 each Service Per Pharmacy     Start Date of  Vancomycin therapy: 11/20/2016    Estimated CrCl: 70  Drug Levels:  Vancomycin Trough   Date/Time Value Ref Range Status   11/27/2016 0845 11.3 10.0 - 20.0 MCG/ML Final       Assessment:   Target levels for this patient: trough ~15 mcg/mL.  Evaluation of level(s): below desired range    Plan:   1. Will increase dose to 1250 mg q24  2. Next scheduled level(s): trough with the 4th dose or sooner if renal function worsens  3. Pharmacy will continue to monitor and adjust therapy as needed.    Javier Gutierrez, [facility]AR[facility]  11/27/2016   Pager [phone]

## 2016-11-27 NOTE — Progress Notes
Name:  Javier Gutierrez                                               MRN:  [id number]   Admission Date:  11/20/2016  Today's Date: 11/26/16  LOS: 6      ASSESSMENT AND PLAN     76 year old man with history of hypertension, hyperlipidemia, coronary artery disease, aortic stenosis, chronic kidney disease, peripheral artery disease, and lifetime smoker who presents with concern for weakness and cough. ???Per daughter he is reported to have a fall one week ago where he was down for 15 hours and generalized worsening fatigue over the last few weeks.  ???  Complicated parapneumonic effusion  - CT Chest 11/20/16: Consolidation of the majority of the left mid and lower lung, most compatible with pneumonia and partially loculated pleural effusion. Underlying neoplastic endobronchial lesion possible.  - LDH 293, white count 1400, pH of pleural fluid 7.29, cytology negative for malignant cells  - pleural fluid culture STREPTOCOCCUS INTERMEDIUS sensitive to PCN  - urine strep and leg Ag are negative, RVP negative  - s/p chest tube placement 11/20/16  - repeat CT 11/24/16 scan showed development of scattered opacities throughout the right lung that is most suggestive of pneumonia (new), Small, partially loculated left-sided hydropneumothorax with a pleural drain in place. ???The pleural fluid has mildly decreased and the pleural gas has developed since the October 30  - because of now new right sided ground glass opacities as well as up trending WBC, added Levaquin 11/24/16 on top of zosyn and vanc  - pulmonary following: Resume intrapleural t-PA/Dornase for 6 more doses, keep CT to -20cm wall suction when not clamped with t-pa or dornase, continue aggressive Pulmonary hygiene  - Nov 5: ID consulted recommended continue current antibiotics.   - Nov5 : CTS consulted. No surgical intervention   - Nov 5: CT showed Slight??? increase??? in??? mild??? patchy??? groundglass??? opacities??? throughout??? the??? right??? lung. Unchanged??? small??? right??? pleural??? effusion.    Persistent leucocytosis/ Sepsis   - will need to discuss the utility of bronchoscopy with pulm team to investigate persistent leucocytosis. Echo ordered.   - Speech therapy done Nov 5.   - fu procalcitonin    Anemia and thrombocytosis  - most likely due to chronic inflammation  -check iron panel.   ???  CAD  - Continue PTA Aspirin/COreg  - no chest pain  ???  Recent Mechanical fall  - CT head negative  - CT C spine was suspicious for possible lytic lesions to the c spine  - Consult PT/OT for eval  ???  HLD/HTN  - He is of his simvastatin and fenofibrate due to reported muscle weakness since last Thursday Nov 1st  - Continue PTA coreg and hydralazine  ???  Hypothyroidism  - TSH/Free T4 normal  - Continue PTA synthroid  ???  Aortic Valve stenosis   - S/P TAVR in 2017  ???  PT/OT recommneds inpatient setting  Will repeat 12 lead ECG to check QT interval, because he is on Levaquin and cilostazol (reviewed the old one)    Total floor/unit time (reviewing and writing notes, examining the patient, reviewing test results etc) spent was 35 minutes of which > 50% was spent in care coordination  and bedside counseling (explaining treatment options, disease processes, laboratory/imaging results, prognosis, risks and benefits of treatment options,  medication side effects, importance of compliance with treatment, risk factor reduction, follow up with primary care physician).   - Discussed with ID, daughter and brother at bedside Nov 5 as well as at the huddle.     SUBJECTIVE   Patient seen and examined.    Overnight:   No interval events.    During this AM visit, patient states feeling better, appetite improving too  ROS:  >>> Denies fever/chills            >>> Denies chest pain, SOB            >>> Denies  N/V/D            >>> Denies dizziness/lightheadedness       OBJECTIVE     VITAL SIGNS   BP: 141/52 (11/05 1215)  Temp: 36.7 ???C (98.1 ???F) (11/05 1100)  Pulse: 77 (11/05 1215)  Respirations: 16 PER MINUTE (11/05 1100) SpO2: 93 % (11/05 1100)  O2 Delivery: None (Room Air) (11/05 1100)       PHYSICAL EXAM   General: ???Alert and response to question appropriately. ???VS as above. No acute distress . ??????  Eyes: No conjunctival injection. ???Pupil equal on both size?????????No scleral icterus. ???  Ears, nose, mouth, and throat : No erythema. ??????MMM. ??????No erythema, exudate noted. ???  Neck: ???Symmetric appearance without crepitus, no obvious mass or noticeable swelling,   Lungs: ???No use of accessory muscles. ???Clear to auscultation bilaterally without wheezes, rales, or rhonchi. Chest tube in place. Decrease BS at the bases more over the L lung  Cardiovascular: Regular rate, S1, S2 normal, no clicks, rubs, or gallops appreciated . ???2+ and symmetric, all extremities. ???No edema in BLE.   Abdomen: ???BS+, soft, ND, ???no guarding or rigidity. ???Nontender to palpation.  Skin: Skin color normal, no obvious evidence of rashes.   Musculoskeletal: ???Normal 5/5 ???hand-grip strength and ???distal strength in BLE. ???ROM normal. No joints swelling or erythema/tenderness   Neurologic: ???Alerted and oriented x 3. ???5/5 hand-grip and distal strenght. Normal ROM. ???No Sensation grossly intact. ???No focal weakness. ???  Psych:??????Alerted and oriented, calm, normal affect    LAB REVIEW     Recent Labs      11/24/16   0532  11/25/16   0528   WBC  20.7*  19.4*   HGB  7.6*  7.5*   HCT  23.0*  22.3*   PLTCT  563*  578*   MCV  89.5  86.6     Recent Labs      11/25/16   0528  11/26/16   0654   NA  130*  130*   K  3.5  3.9   CL  100  100   CO2  27  24   BUN  22  18   CR  0.88  0.84   GAP  3  6   GFR  >60  >60   ALBUMIN  1.6*  1.8*   TOTBILI  0.5  0.4   AST  19  21   ALT  10  8       MEDICATIONS   Scheduled Meds:    alteplase (ACTIVASE) 10 mg in sodium chloride 0.9% (NS) 30 mL intrapleural syringe 10 mg Intrapleural BID   And      dornase alfa (PULMOZYME) 5 mg in water (sterile) for injection 30 mL intrapleural syringe 5 mg Intrapleural BID   aspirin EC tablet 81 mg 81 mg Oral QDAY carvedilol (COREG) tablet 12.5  mg 12.5 mg Oral BID   cilostazol(+) (PLETAL) tablet 50 mg 50 mg Oral BID before meals   dutasteride (AVODART) capsule 0.5 mg 0.5 mg Oral QDAY   enoxaparin (LOVENOX) syringe 40 mg 40 mg Subcutaneous QDAY(21)   levoFLOXacin (LEVAQUIN) oral solution 750 mg 750 mg Oral Q24H*   levothyroxine (SYNTHROID) tablet 100 mcg 100 mcg Oral QDAY   montelukast (SINGULAIR) tablet 10 mg 10 mg Oral QDAY   piperacillin/tazobactam  (ZOSYN) 4.5 g/100 mL iso-osmotic IVPB 4.5 g Intravenous Q6H*   polyethylene glycol 3350 (MIRALAX) packet 17 g 1 packet Oral BID   senna/docusate (SENOKOT-S) tablet 1 tablet 1 tablet Oral BID   vancomycin (VANCOCIN) 1,000 mg in dextrose 5% (D5W) 250 mL IVPB (Vial2Bag) 1 g Intravenous Q24H*   Continuous Infusions:  PRN and Respiratory Meds:albuterol Q4H PRN, [DISCONTINUED] vancomycin   IVPB Q12H* **AND** vancomycin, pharmacy to manage Per Pharmacy      RADIOLOGY AND OTHER DIAGNOSTIC PROCEDURES REVIEW       iology and Other Diagnostic Procedures Review:    11/26/2016    Pertinent radiology reviewed.

## 2016-11-27 NOTE — Patient Education
Medication Education    Letta Moynahanhomas Berhane accepted counseling and was receptive.  he needs reinforcement.    The following medications were discussed:  Aspirin, Coreg, Pletal, Avodart, Synthroid, Singulair, Zosyn, Vancomycin    Where indicated, the patient was provided with additional medication and/or disease-state information.  All patient questions were answered and patient acknowledged understanding of the medications, side effects and other pertinent medication information.    Follow up should occur daily.    Continue to address: indications    Lesly DukesMolly Meagher, RN

## 2016-11-27 NOTE — Progress Notes
PHYSICAL THERAPY  PROGRESS NOTE       MOBILITY:  Mobility  Progressive Mobility Level: Walk in hallway  Distance Walked (feet): 80 ft  Level of Assistance: Assist X1  Assistive Device: Walker    SUBJECTIVE:  Subjective  Significant hospital events: PMH significant for HTN, HLD, CAD, aortic stenosis s/p TAVR, CKD, PAD, lifetime smoker, chronic diastolic heart failure. Admitted 11/20/16 with weakness and cough. Found atelectasis and L PE with chest tube placed.   Mental / Cognitive Status: Alert;Cooperative;Follows Commands  Persons Present: Son  Pain: Patient has no complaint of pain  Pain Interventions: Patient agrees to participate in therapy  Precautions:  (chest tube to water seal back to wall suction)  Ambulation Assist: Independent Mobility in Community with Device  Patient Owned Equipment: Nurse, adult  Home Situation: Lives Alone  Type of Home: Apartment (independent living)  Entry Stairs: No Stairs  In-Home Stairs: No Stairs       BED MOBILITY/TRANSFERS:  Bed Mobility/Transfers  Bed Mobility: Sit to Supine: Moderate Assist  Transfer Type: Sit to Stand  Transfer: Assistance Level: From;Bed Side Chair;Moderate Assist (lifting assist from lower surface)  Transfer: Assistive Device: Roller Walker  Transfers: Type Of Assistance: Verbal Cues;Elevated Bed;For Balance;For Strength Deficit;For Safety Considerations  Other Transfer Type: Stand Pivot  Other Transfer: Assistance Level: To;x2 People;Bed;Minimal Assist  Other Transfer: Assistive Device: Roller Walker  Other Transfer: Type Of Assistance: Verbal Cues;For Strength Deficit;For Safety Considerations;For Balance  End Of Activity Status: In Bed;Instructed Patient to Request Assist with Mobility;Instructed Patient to Use Call Light (bed alarm activated)       GAIT:  Gait  Gait Distance: 80 feet (side-step up to chair)  Gait: Assistance Level:  (contact guard assist)  Gait: Assistive Device: Nurse, adult (and line magagement assist; son assisted) Gait: Descriptors: Pace: Slow;Forward trunk flexion  Activity Limited By: Complaint of Fatigue  Comments: patient reports no signs of SOA. ambulating on room air       ASSESSMENT/PROGRESS:  Assessment/Progress  Impaired Mobility Due To: Decreased Strength;Impaired Balance;Decreased Activity Tolerance;Deconditioning  Assessment/Progress: Should Improve w/ Continued PT  Comments: improved mobility this date. chest tube back to wall suction- requires portal suction for mobility training. anticipate continued need for therapy intervention.       AM-PAC 6 Clicks Basic Mobility Inpatient  Turning from your back to your side while in a flat bed without using bed rails: A Little  Moving from lying on your back to sitting on the side of a flatbed without using bedrails : A Lot  Moving to and from a bed to a chair (including a wheelchair): A Little  Standing up from a chair using your arms (e.g. wheelchair, or bedside chair): A Little  To walk in hospital room: A Little  Climbing 3-5 steps with a railing: A Lot  Raw Score: 16  Standardized (T-scale) Score: 38.32  Basic Mobility CMS 0-100%: 47.12  CMS G Code Modifier for Basic Mobility: CK    GOALS:  Goals  Goal Formulation: With Patient/Family  Time For Goal Achievement: 5 days  Pt Will Go Supine To/From Sit: w/ Stand By Assist  Pt Will Transfer Bed/Chair: w/ Stand By Assist  Pt Will Transfer Sit to Stand: w/ Stand By Assist  Pt Will Ambulate: Greater than 200 Feet, w/ Dan Humphreys, w/ Stand By Assist    PLAN:  Plan   Treatment Interventions: Mobility Training;Strengthening;Balance Activities;Endurance Training  Plan Frequency: 5 Days per Week  PT Plan for Next Visit: increase time out  of bed to chair, increase gait distance as tolerated, endurance training    RECOMMENDATIONS:  PT Discharge Recommendations  PT Discharge Recommendations: Inpatient Setting (as patient with generalized deconditioning and lives alone)  Equipment Recommendations: Nurse, adult Recommend ongoing assistance for: Transfers;Bed mobility;Ambulation;Stairs;Safety concerns;In and out of house    Therapist: Joice Lofts Jesenia Spera  Date: 11/27/2016

## 2016-11-27 NOTE — Progress Notes
CLINICAL NUTRITION                                                        Clinical Nutrition Follow-Up Summary     NAME:Javier Gutierrez             MRN: [id number]             DOB:10/20/1940          AGE: 76 y.o.  ADMISSION DATE: 11/20/2016             DAYS ADMITTED: LOS: 7 days    Nutrition Assessment of Patient:  Malnutrition Assessment: Does not meet criteria  Current Oral Intake: Inadequate  Estimated Calorie Needs: 1700-1840 kcal (25-27 kcal/kg present wt 68kg)  Estimated Protein Needs: 82-88g (1.2-1.3g/kg present wt 68kg)  Oral Diet Order: Cardiac  Oral Supplement: Boost Plus, TID  PO Intake (calories) Daily Average : 694 kilocalories (41% of minimum estimated needs)  PO Intake (protein) Daily Average : 26 grams (32% of minimum estimated needs)      Comments:  76 year old man with history of hypertension, hyperlipidemia, coronary artery disease, aortic stenosis, CKD, PAD, and lifetime smoker who presents with concern for weakness and cough. Per daughter he is reported to have a fall one week ago where he was down for 15 hours and generalized worsening fatigue over the last few weeks. Also with stage I coccyx pressure injury per RN documentation. Pt is being followed by clinical nutrition services for poor intake PTA & throughout admission. He is s/p SLP 10/30 who recommended NPO given concern for aspiration. However, cleared for regular textures 10/31 following VSS. He continues to be very lethargic; fell asleep multiple times during visit. Spoke mostly with son at bedside who reports ongoing struggles with PO intake. He did eat very well yesterday (consumed 1161 kcal & 45g protein not including 2-3 Boosts that weren't documented) but overall not reaching kcal/protein goals and is again struggling with intake today. Has had only one Boost so far despite encouragement.     Recommendation:  Continue least restrictive diet textures per SLP. REC liberalizing diet to regular to encourage PO efforts Encourage intake of Boost Plus (mixed with ice cream, beneprotein) at and between meals    Pt would likely benefit from corpak placement & EN initiation if he becomes agreeable. If corpak placed, goal of 61mL/hr would provide 100% of estimated needs (1800 kcal & 82g protein). Goal of 18mL/hr would provide ~70% of needs (1260 kcal & 57g protein)                               Intervention / Plan:  Encouraged ongoing intake of Boost Plus; encouraged pt to try in a milkshake mixed with beneprotein  Will continue to monitor PO intake adequacy/tolerance  Will continue to monitor GI function, wt trends, labs    Nutrition Diagnosis:  Inadequate oral intake  Etiology: decreased appetite, lethargy  Signs & Symptoms: son report, calorie count data    Goals:  PO intake to meet >50% nutritional needs  Time Frame: Within 72 Hours  Status: Not met;Ongoing    [place] Luetkemeyer, MS, RD, LD  Pager: [phone]  Phone: [phone]

## 2016-11-27 NOTE — Progress Notes
Infectious Disease Progress Note    Name:  Javier Gutierrez   ZOXWR'U Date:  11/27/2016    Assessment:     Javier Gutierrez is a 76 year old male with history of HTN,DL, hypothyroidism, RAS post left renal stent, CAD, Diastolic HF, Severe AS post TAVR in 07/2015, CKD,PAD, tobacco abuse admitted on 10/30 for CAP complicated by parapneumonic left pleural effusion and developed a new pneumonia.  ???  ???  -CAP with complicated left parapneumonic pleural effusion  -Loculated Empyema  -patient admitted on 10/30 with 1 week history of progressive weakness, productive cough, progressive SOB.  -WBC was  04540 on admission  -CT chest on 10/30 showed Consolidation of the majority of the left mid and lower lung, most compatible with pneumonia and partially loculated pleural effusion.Moderate sized partially loculated loculated left pleural effusion with areas of probable pleural thickening.  - post chest tube placement on 10/30 and tPA  -pleural fluid studies showed exudative fluid with pH=7.29, WBC=1400, glucose=24, culture grew strep intermedius sensitive to PCN, cytology negative.  -patient was started on VANC + Zosyn on 10/30  -Repeat CT chest 11/3 showed ???Small, partially loculated left-sided hydropneumothorax with a pleural drain in place with persistent left consolidation and new right lung infection.  -tPA was extended for 6 more doses.   -CTS consulted---> no surgical intervention.  ???  -Hospital-Acquired pneumonia  -On 11/3 CT chest repeat showed Small, partially loculated left-sided hydropneumothorax with a pleural drain in place + Progressive consolidation of the near entirety of the left lower lobe + ???Development of scattered opacities throughout the right lung that is most suggestive of pneumonia.  -patient with persistent leucocytosis despite 5 days of Zosyn and Vancomycin,levofloxacin added   -Repeat CT chest 11/5 showed Slight increase in mild patchy groundglass opacities throughout the   right lung.  -Procal=0.12 -Sputum gram stain no organisms seen.  ???  -CAD  ??? 03/11/15: Cardiac cath ( via Rt radial approach), 30%pLAD, 40%mLAD, 50-60%mLcx, 30-40%pRCA, 30%mRCA followed by 30% distal   ???  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  -Tobacco abuse  -31 pack-year smoking history.  ???  -Concern is for inadequate source concern, patient is clinically better but with persistent leucocytosis + no improvement of left lung consolidation we are concerned that chest tube is not draining the whole empyema and there are additional loculations that need to be drained versus the presence of postobstructive pneumonia.      Recommendations:     -Concern is for worsening right lung infiltrates despite patient being on Antibiotics, pulmonary considering bronchoscopy.  -Can continue levofloxacin for total of 5 days then stop it, continue Zosyn and Vancomycin for now.  - Repeat blood Cx for fever > 38.3    Patient seen and discussed with Dr. Noah Charon.    ATTESTATION    I have personally seen and examined the patient, and reviewed all diagnostic tests available. I have discussed the case with Dr. Christella Noa and agree with her assessment and recommendations. Changes to the text were made where appropriate.    Staff name:  Daryl Eastern, MD  Division of Infectious Diseases  Pager [phone]  Date:  11/27/2016           _____________________________________________________________________________  Interval History  Patient had no overnight fever, he denies worsening SOB, denies worsening cough, he denies chest pain. Patient had onset of diarrhea this morning so Cdiff was ordered. Patient still  has chest tube in draining. Patient denies dizziness, no lightheadedness.       Antimicrobial Start date End date   Zosyn  10/30    Vancomycin 10/30    Levofloxacin  11/3                        Estimated Creatinine Clearance: 70.3 mL/min (based on SCr of 0.86 mg/dL).    Medications Scheduled Meds:    aspirin EC tablet 81 mg 81 mg Oral QDAY   carvedilol (COREG) tablet 12.5 mg 12.5 mg Oral BID   cilostazol(+) (PLETAL) tablet 50 mg 50 mg Oral BID before meals   dutasteride (AVODART) capsule 0.5 mg 0.5 mg Oral QDAY   enoxaparin (LOVENOX) syringe 40 mg 40 mg Subcutaneous QDAY(21)   levoFLOXacin (LEVAQUIN) oral solution 750 mg 750 mg Oral Q24H*   levothyroxine (SYNTHROID) tablet 100 mcg 100 mcg Oral QDAY   montelukast (SINGULAIR) tablet 10 mg 10 mg Oral QDAY   piperacillin/tazobactam  (ZOSYN) 4.5 g/100 mL iso-osmotic IVPB 4.5 g Intravenous Q6H*   polyethylene glycol 3350 (MIRALAX) packet 17 g 1 packet Oral BID   senna/docusate (SENOKOT-S) tablet 1 tablet 1 tablet Oral BID   [START ON 11/28/2016] vancomycin (VANCOCIN) 1,250 mg in dextrose 5% (D5W) IVPB 1,250 mg Intravenous Q24H*   Continuous Infusions:  PRN and Respiratory Meds:albuterol Q4H PRN, [DISCONTINUED] vancomycin   IVPB Q12H* **AND** vancomycin, pharmacy to manage Per Pharmacy      Physical Examination                          Vital Signs: Last                  Vital Signs: 24 Hour Range   BP: 132/45 (11/06 1515)  Temp: 36.6 ???C (97.8 ???F) (11/06 1515)  Pulse: 78 (11/06 1515)  Respirations: 20 PER MINUTE (11/06 1515)  SpO2: 96 % (11/06 1515)  O2 Delivery: None (Room Air) (11/06 1515)  SpO2 Pulse: 74 (11/06 1115)  Height: 177.8 cm (70) (11/06 1316) BP: (107-137)/(38-65)   Temp:  [36.6 ???C (97.8 ???F)-36.9 ???C (98.4 ???F)]   Pulse:  [75-83]   Respirations:  [16 PER MINUTE-20 PER MINUTE]   SpO2:  [95 %-99 %]   O2 Delivery: None (Room Air)     General appearance: alert, oriented, NAD  HENT: mucus membranes moist, no oral lesions/thrush  Eyes: PERRL, EOM grossly intact, Conj nl, pinpoint pupils  Neck: supple, no lymphadenopathy, thyroid not palpable, JVD +2  Lungs: crackles on the right lung   Heart: Regular rhythm, reg rate, with no murmur, rub, gallop  Abdomen: soft, non-tender, non-distended, normoactive bowel sounds, no hepatosplenomegaly, no masses Ext:  No clubbing, cyanosis or edema  Skin: no rashes/lesions  Lymph: no cervical, axillary or inguinal adenopathy  ???  Lines: he has a chest tube placed since 10/30    Laboratory   Hematology  Recent Labs      11/25/16   0528  11/27/16   0543  11/27/16   0830   WBC  19.4*  14.8*   --    HGB  7.5*  6.9*  7.0*   HCT  22.3*  20.6*  21.0*   PLTCT  578*  586*   --      Chemistry  Recent Labs      11/25/16   0528  11/26/16   0654  11/27/16   0543   NA  130*  130*  130*   K  3.5  3.9  3.6   CL  100  100  99   CO2  27  24  26    BUN  22  18  15    CR  0.88  0.84  0.86   GFR  >60  >60  >60   GLU  89  92  90   CA  7.3*  7.4*  7.4*   ALBUMIN  1.6*  1.8*  1.6*   ALKPHOS  37  40  36   AST  19  21  17    ALT  10  8  8    TOTBILI  0.5  0.4  0.3       Microbiology, Radiology and other Diagnostics Review   Microbiology reviewed.    Pertinent radiology viewed.  Impression:   CT chest 11/26/16:     1. Indwelling left thoracostomy tube with improvement in the partially   loculated left hydropneumothorax.  2. Persistent lower lobe consolidation, left greater than right, likely   pneumonia.  3. Slight increase in mild patchy groundglass opacities throughout the   right lung.  4. Unchanged small right pleural effusion. ???      Daryl Eastern, MD   Pager [phone]  Infectious Diseases Faculty

## 2016-11-27 NOTE — Progress Notes
Patient hemoglobin with AM labs is 6.9. MPG text paged

## 2016-11-28 ENCOUNTER — Encounter: Admit: 2016-11-28 | Discharge: 2016-11-28 | Payer: MEDICARE

## 2016-11-28 DIAGNOSIS — R05 Cough: Secondary | ICD-10-CM

## 2016-11-28 LAB — SODIUM-URINE RANDOM: Lab: 70 MMOL/L

## 2016-11-28 LAB — OSMOLALITY-URINE RANDOM: Lab: 227 mosm/kg (ref 50–1400)

## 2016-11-28 LAB — COMPREHENSIVE METABOLIC PANEL: Lab: 130 MMOL/L — ABNORMAL LOW (ref >60)

## 2016-11-28 LAB — CBC AND DIFF: Lab: 15 K/UL — ABNORMAL HIGH (ref >60)

## 2016-11-28 LAB — PERIPHERAL SMEAR

## 2016-11-28 LAB — OSMOLALITY: Lab: 268 mosm/kg — ABNORMAL LOW (ref 280–307)

## 2016-11-28 LAB — CORTISOL-AM: Lab: 15 ug/dL (ref 6.7–22.6)

## 2016-11-28 MED ORDER — FUROSEMIDE 10 MG/ML IJ SOLN
20 mg | Freq: Once | INTRAVENOUS | 0 refills | Status: CP
Start: 2016-11-28 — End: ?
  Administered 2016-11-28: 23:00:00 20 mg via INTRAVENOUS

## 2016-11-28 MED ORDER — PANTOPRAZOLE 40 MG IV SOLR
40 mg | Freq: Every day | INTRAVENOUS | 0 refills | Status: DC
Start: 2016-11-28 — End: 2016-12-09
  Administered 2016-11-28 – 2016-12-08 (×11): 40 mg via INTRAVENOUS

## 2016-11-28 NOTE — Case Management (ED)
Case Management Progress Note    NAME:Javier Gutierrez                          MRN: [id number]              DOB:08/07/1940          AGE: 76 y.o.  ADMISSION DATE: 11/20/2016             DAYS ADMITTED: LOS: 8 days      Todays Date: 11/28/2016    Plan  SW anticipates pt to DC to [facility] when medically stable.    Interventions  ? Support   Support: Pt/Family Updates re:POC or DC Plan    SW reviewed EMR and discussed POC with team, anticipated DC Monday.   ID folliwng-pt on IV Zosyn and Vanc-possible bronch alter in admission.   PUlm following and recommending pul;m hygiene.   CTS does not have plans for surgery.     ? Info or Referral      ? Discharge Planning   Discharge Planning: Inpatient Rehabilitation    SW met with pt and dtr per family request.   SW discussed DCP, family in agreement with [facility] preference, second choice [place].   Family aware of EDD of Monday. SW to f/u with [facility] admissions Thursday.  ? Medication Needs      ? Financial      ? Legal      ? Other        Disposition  ? Expected Discharge Date    Expected Discharge Date: 12/03/16  ? Transportation   Does the patient need discharge transport arranged?: No  Transportation Name, Phone and Availability #1: Altha Harm Mathes--daughter--[phone]  Does the patient use Medicaid Transportation?: No  ? Next Level of Care (Acute Psych discharges only)      ? Discharge Disposition                                          Durable Medical Equipment     No service has been selected for the patient.      [facility]     No service has been selected for the patient.      [facility]     No service has been selected for the patient.      [facility]     No service has been selected for the patient.          Modesta Messing, [place]

## 2016-11-28 NOTE — Progress Notes
PHYSICAL THERAPY  PROGRESS NOTE       MOBILITY:  Mobility  Progressive Mobility Level: Walk in hallway  Distance Walked (feet): 80 ft  Level of Assistance: Assist X1  Assistive Device: Walker  Activity Limited By: Weakness    SUBJECTIVE:  Subjective  Significant hospital events: PMH significant for HTN, HLD, CAD, aortic stenosis s/p TAVR, CKD, PAD, lifetime smoker, chronic diastolic heart failure. Admitted 11/20/16 with weakness and cough. Found atelectasis and L PE with chest tube placed.   Mental / Cognitive Status: Alert;Cooperative;Follows Commands  Persons Present: Daughter (Child psychotherapist)  Pain: Patient has no complaint of pain  Pain Interventions: Patient agrees to participate in therapy  Precautions:  (chest tube back to wall suction)  Ambulation Assist: Independent Mobility in Community with Device  Patient Owned Equipment: Nurse, adult  Home Situation: Lives Alone  Type of Home: Apartment (independent living)  Entry Stairs: No Stairs  In-Home Stairs: No Stairs   Plan is to discharge to daughter's home for a short period of time prior to returning to independent living facility       BED MOBILITY/TRANSFERS:  Bed Mobility/Transfers  Comments: bed mobility not observed, sitting up in bedside chair at entry  Transfer Type: Sit to Stand  Transfer: Assistance Level: From;Bed Side Chair;Minimal Assist (improved technique, requires mod cues for carryover)  Transfer: Assistive Device: Nurse, adult  Transfers: Type Of Assistance: Verbal Cues;Elevated Bed;For Balance;For Strength Deficit;For Safety Considerations  Other Transfer Type: Stand Pivot  Other Transfer: Assistance Level: To;Bed Side Chair;Minimal Assist  Other Transfer: Assistive Device: Nurse, adult  Other Transfer: Type Of Assistance: Verbal Cues;For Strength Deficit;For Safety Considerations;For Balance  End Of Activity Status: Up in Chair;Instructed Patient to Request Assist with Mobility;Instructed Patient to Use Call Light (TABS alarm in place) GAIT:  Gait  Gait Distance: 80 feet (x2 bouts)  Gait: Assistance Level:  (contact guard assist)  Gait: Assistive Device: Nurse, adult (and Comptroller)  Gait: Descriptors: Pace: Slow;Forward trunk flexion  Activity Limited By: Complaint of Fatigue;Weakness  Comments: focused on endurance training with small bouts of ambulation. patient initially declined second bout but eventually agreed       ACTIVITY/EXERCISE:  Activity / Exercise  Comments: repeated sit to stand transfer practice       ASSESSMENT/PROGRESS:  Assessment/Progress  Impaired Mobility Due To: Decreased Strength;Impaired Balance;Decreased Activity Tolerance;Deconditioning  Assessment/Progress: Should Improve w/ Continued PT  Comments: improved mobility this date. chest tube back to wall suction- requires portal suction for mobility training. anticipate continued need for therapy intervention. do not anticipate any difficulties with patient being able to tolerate 3 hours of therapy- rehab following. mobility progressive, but continue to monitor medical status.        AM-PAC 6 Clicks Basic Mobility Inpatient  Turning from your back to your side while in a flat bed without using bed rails: A Little  Moving from lying on your back to sitting on the side of a flatbed without using bedrails : A Lot  Moving to and from a bed to a chair (including a wheelchair): A Little  Standing up from a chair using your arms (e.g. wheelchair, or bedside chair): A Little  To walk in hospital room: A Little  Climbing 3-5 steps with a railing: A Lot  Raw Score: 16  Standardized (T-scale) Score: 38.32  Basic Mobility CMS 0-100%: 47.12  CMS G Code Modifier for Basic Mobility: CK    GOALS:  Goals  Goal Formulation: With Patient/Family  Time For Goal  Achievement: 5 days  Pt Will Go Supine To/From Sit: w/ Stand By Assist  Pt Will Transfer Bed/Chair: w/ Stand By Assist  Pt Will Transfer Sit to Stand: w/ Stand By Assist Pt Will Ambulate: Greater than 200 Feet, w/ Dan Humphreys, w/ Stand By Assist    PLAN:  Plan   Treatment Interventions: Mobility Training;Strengthening;Balance Activities;Endurance Training  Plan Frequency: 5 Days per Week  PT Plan for Next Visit: increase time out of bed to chair, increase gait distance as tolerated, endurance training    RECOMMENDATIONS:  PT Discharge Recommendations  PT Discharge Recommendations: Inpatient Setting;Recommend Physical Medicine and Rehabilitation Consult to address most appropriate level of rehabilitation placement (as patient with generalized deconditioning and lives alone)  Equipment Recommendations: Nurse, adult  Recommend ongoing assistance for: Transfers;Bed mobility;Ambulation;Stairs;Safety concerns;In and out of house    Therapist: Joice Lofts Kenedee Molesky  Date: 11/28/2016

## 2016-11-28 NOTE — Progress Notes
Pulmonary Progress Note      Admission Date: 11/20/2016  LOS: 8 days                     Assessment/Plan:    Principal Problem:    Parapneumonic effusion  Active Problems:    Tobacco use disorder    CAD (coronary artery disease)    Chronic diastolic (congestive) heart failure (HCC)    Pneumonia    Sepsis (HCC)    Acute respiratory failure with hypoxia (HCC)    Hyponatremia      Javier Gutierrez is a 76 y.o. male admitted for progressive weakness and cough. Patient was found to have left sided pleural effusion and pneumonia.   He had a chest tube placed for radiographically complicated parapneumonic effusion(now empyema) on 11/20/16. Right now, his pleural fluid grew Streptococcus intermedius which is an oral pathogen.   He is not having any fevers and his wbc is stable  CXR reviewed with persistent LLL changes and effusion. Lung volumes little better.  His 62fr CT output was 283cc last 24hours.   He has developed some GG changes on the right with trace pleural effusion as well. We have to keep monitoring these.   Patient was seen by speech and was not noted to be aspirating, although this organism is highly suspicious for aspiration.  Patient overall is stable but still with significant chest tube output, we may assess his effusion with [place] today and see if loculations still present.  CT surgery has evaluated him and there is no plan for surgical intervention at this point.    - Likely Aspiration pneumonia  - left sided Empyema with streptococcus intermedius  - Atelectasis, compressive from effusion versus endobronchial lesion  - Developing right sided infiltrates and small effusion  - tobacco use  - AS s/p TAVR 07/2015      Specific Recs for today:  - Continue with abx per primary team, (currently Vanco/Zosyn)  - Will check bedside thoracic [place] to assess the effusion, may need repeat imaging CT chest  - Will need at least 4-6 weeks of antibiotics, ID following - We are considering a bronchoscopy to evaluate LLL and maybe obtain BAL, although likely to be secretions related.  - s/p 2 rounds of intrapleural t-PA/Dornase  - keep CT to -20cm wall suction for now  - Continue aggressive Pulmonary hygiene (IS, flutter valve, PT/OT, up and out of bed when able)      M.Wynelle Beckmann, MD  Pulmonary and Critical Care Fellow  Pager [phone]    Patient was seen and discussed w/ Dr. Rondel Oh. Please see his/her attestation for updates to recommendations.  ________________________________________________________________________    Subjective  Patient appears to be stable  No new complaints  Producing sputum with weak cough  Walked in the hallway yesterday    ROS:  Weakness  No fevers or chills    Medications  Scheduled Meds:    aspirin EC tablet 81 mg 81 mg Oral QDAY   carvedilol (COREG) tablet 12.5 mg 12.5 mg Oral BID   cilostazol(+) (PLETAL) tablet 50 mg 50 mg Oral BID before meals   dutasteride (AVODART) capsule 0.5 mg 0.5 mg Oral QDAY   enoxaparin (LOVENOX) syringe 40 mg 40 mg Subcutaneous QDAY(21)   levoFLOXacin (LEVAQUIN) oral solution 750 mg 750 mg Oral Q24H*   levothyroxine (SYNTHROID) tablet 100 mcg 100 mcg Oral QDAY   montelukast (SINGULAIR) tablet 10 mg 10 mg Oral QDAY   pantoprazole (PROTONIX) injection 40  mg 40 mg Intravenous QDAY   piperacillin/tazobactam  (ZOSYN) 4.5 g/100 mL iso-osmotic IVPB 4.5 g Intravenous Q6H*   polyethylene glycol 3350 (MIRALAX) packet 17 g 1 packet Oral BID   senna/docusate (SENOKOT-S) tablet 1 tablet 1 tablet Oral BID   vancomycin (VANCOCIN) 1,250 mg in dextrose 5% (D5W) IVPB 1,250 mg Intravenous Q24H*   Continuous Infusions:  PRN and Respiratory Meds:albuterol Q4H PRN, [DISCONTINUED] vancomycin   IVPB Q12H* **AND** vancomycin, pharmacy to manage Per Pharmacy        Objective                       Vital Signs: Last Filed                 Vital Signs: 24 Hour Range   BP: 138/56 (11/07 0849)  Temp: 36.8 ???C (98.2 ???F) (11/07 2956)  Pulse: 79 (11/07 0849) Respirations: 20 PER MINUTE (11/07 0644)  SpO2: 97 % (11/07 0644)  O2 Delivery: None (Room Air) (11/07 0644)  SpO2 Pulse: 74 (11/06 1115)  Height: 177.8 cm (70) (11/06 1316) BP: (107-159)/(43-60)   Temp:  [36.6 ???C (97.8 ???F)-37 ???C (98.6 ???F)]   Pulse:  [74-83]   Respirations:  [18 PER MINUTE-20 PER MINUTE]   SpO2:  [94 %-100 %]   O2 Delivery: None (Room Air)   Intensity Pain Scale (Self Report): Asleep (11/28/16 0330) Vitals:    11/20/16 0518 11/27/16 1316   Weight: 68 kg (150 lb) 68 kg (150 lb)       Intake/Output Summary:  (Last 24 hours)    Intake/Output Summary (Last 24 hours) at 11/28/16 2130  Last data filed at 11/28/16 0800   Gross per 24 hour   Intake              510 ml   Output              645 ml   Net             -135 ml      Stool Occurrence: 1    Physical Exam  Gen: Alert, NAD.  HEENT: Normocephalic, atraumatic. EOMI, no oral lesions.  RESP: diminished with decreased BS at left and right base, left sided chest tube,  no accessory muscle use, normal WOB.  CVS: RRR, no obvious murmurs, rubs or gallops. No JVD.  ABD: Soft, non-tender, non-distended, BS+  Ext: No rashes, no edema, no cyanosis  Neuro: Alert and oriented, fluent speech, symmetric face  Psych: Mood stable.  Skin: No rashes on exposed skin. Warm and dry.      Lab Review  Results for orders placed or performed during the hospital encounter of 11/20/16 (from the past 24 hour(s))   CULTURE-RESP,LOWER W/SENSITIVITY    Collection Time: 11/27/16  9:45 AM   # # Low-High    Battery Name LOWER RESP CULTURE     Specimen Description SPUTUM     Special Requests NONE     Direct Gram Stain       GREATER THAN 25/LPF  NEUTROPHILS  LESS THAN 10/LPF  SQUAMOUS EPITHELIAL CELLS  NO ORGANISMS SEEN      Culture      Report Status     GRAM STAIN    Collection Time: 11/27/16  9:45 AM   # # Low-High    Battery Name GRAM STAIN      Specimen Description SPUTUM     Special Requests NONE     Gram Stain  GREATER THAN 25/LPF  NEUTROPHILS  LESS THAN 10/LPF SQUAMOUS EPITHELIAL CELLS  NO ORGANISMS SEEN      Report Status FINAL  11/27/2016      CBC AND DIFF    Collection Time: 11/28/16  5:49 AM   # # Low-High    White Blood Cells 15.5 (H) 4.5 - 11.0 K/UL    RBC 2.49 (L) 4.4 - 5.5 M/UL    Hemoglobin 7.3 (L) 13.5 - 16.5 GM/DL    Hematocrit 45.4 (L) 40 - 50 %    MCV 87.6 80 - 100 FL    MCH 29.5 26 - 34 PG    MCHC 33.7 32.0 - 36.0 G/DL    RDW 09.8 (H) 11 - 15 %    Platelet Count 630 (H) 150 - 400 K/UL    MPV 6.9 (L) 7 - 11 FL    Neutrophils 81 (H) 41 - 77 %    Lymphocytes 8 (L) 24 - 44 %    Monocytes 8 4 - 12 %    Eosinophils 2 0 - 5 %    Basophils 1 0 - 2 %    Absolute Neutrophil Count 12.50 (H) 1.8 - 7.0 K/UL    Absolute Lymph Count 1.30 1.0 - 4.8 K/UL    Absolute Monocyte Count 1.30 (H) 0 - 0.80 K/UL    Absolute Eosinophil Count 0.30 0 - 0.45 K/UL    Absolute Basophil Count 0.10 0 - 0.20 K/UL   COMPREHENSIVE METABOLIC PANEL    Collection Time: 11/28/16  5:49 AM   # # Low-High    Sodium 130 (L) 137 - 147 MMOL/L    Potassium 3.7 3.5 - 5.1 MMOL/L    Chloride 100 98 - 110 MMOL/L    Glucose 85 70 - 100 MG/DL    Blood Urea Nitrogen 13 7 - 25 MG/DL    Creatinine 1.19 0.4 - 1.24 MG/DL    Calcium 7.5 (L) 8.5 - 10.6 MG/DL    Total Protein 4.8 (L) 6.0 - 8.0 G/DL    Total Bilirubin 0.3 0.3 - 1.2 MG/DL    Albumin 1.7 (L) 3.5 - 5.0 G/DL    Alk Phosphatase 38 25 - 110 U/L    AST (SGOT) 21 7 - 40 U/L    CO2 26 21 - 30 MMOL/L    ALT (SGPT) 14 7 - 56 U/L    Anion Gap 4 3 - 12    eGFR Non African American >60 >60 mL/min    eGFR African American >60 >60 mL/min   OSMOLALITY    Collection Time: 11/28/16  5:49 AM   # # Low-High    Osmolality 268 (L) 280 - 307 MOSMOL/KG   CORTISOL-AM    Collection Time: 11/28/16  5:49 AM   # # Low-High    Cortisol-AM 15.1 6.7 - 22.6 MCG/DL   SODIUM-URINE RANDOM    Collection Time: 11/28/16  7:51 AM   # # Low-High    Sodium, Random 70 MMOL/L        Point of Care Testing  (Last 24 hours)  Glucose: 85  POC Glucose (Download): (!) 134 Radiology and other Diagnostics Review:  I have personally reviewed the radiology results of the last 24 hours and looked at chest xray and ct findings

## 2016-11-28 NOTE — Patient Education
Medication Education    Javier Gutierrez accepted counseling and was receptive.  he needs reinforcement.    The following medications were discussed:  Coreg  Pletal  Lovenox  Synthroid  Zosyn  Miralax (not given)  Senokot (not given)  Vancomycin     RN provided additional education on high fall risk bundle, mobility, q2 turns, chest tube maintenance and dressing change, IV placement, diet orders, and plan of care.     Where indicated, the patient was provided with additional medication and/or disease-state information.  All patient questions were answered and patient acknowledged understanding of the medications, side effects and other pertinent medication information.    Follow up should occur daily.    Continue to address: indications    Doy MinceMadison Yannis Broce, RN

## 2016-11-28 NOTE — Patient Education
Medication Education    Javier Gutierrez accepted counseling and was receptive. he needs reinforcement.    The following medications were discussed:  Aspirin, Coreg, Pletal, Avodart, Synthroid, Singulair, Zosyn, Vancomycin    Where indicated, the patient was provided with additional medication and/or disease-state information. All patient questions were answered and patient acknowledged understanding of the medications, side effects and other pertinent medication information.    Follow up should occur daily.    Continue to address: indications    Lesly DukesMolly Meagher, RN

## 2016-11-28 NOTE — Progress Notes
Physical Medicine & Rehabilitation Progress Note     Patient name: Javier Gutierrez  Patient age: 76 y.o.  Today's Date:  11/28/2016  Admission Date: 11/20/2016  LOS: 8 days                     Assessment/Plan:     Principal Problem:    Parapneumonic effusion  Active Problems:    Tobacco use disorder    CAD (coronary artery disease)    Chronic diastolic (congestive) heart failure (HCC)    Pneumonia    Sepsis (HCC)    Acute respiratory failure with hypoxia (HCC)    Hyponatremia      Javier Gutierrez is a 76 y.o. male admitted to [facility] on 11/20/2016 with the following issues:    Debility 2/2 sepsis, complicated para pneumonic effusion leading to acute respiratory failure.   ???  Impairments: weakness  Activity Limitations:  dressing - upper, dressing - lower, transfers and ambulation  Participation Restrictions: unable to return home safely    This is a follow up visit from initial consultation performed on 11/2    Hospital Course: Javier Gutierrez is a 76 y/o male w/ Hx of diastolic CHF, AS s/p TAVR, CAD, CKD, HTN, who presented to [facility] ED on 10/30 with weakness and SOB. He was found on chest CT to have Lt sided lung consolidation concerning for PNA w/ effusion and a suspicious lumg mass. Pulmonolgy was consulted, placed pigtail CT to suction, fluid analysis consistent with complicated pneumonic effusion, CT placed to wall suction (cytology pending). Empiric abx therapies initiated. The patient's stay has been complicated by acute respiratory failure with new oxygen requirement, sepsis, shock. Repeat chest CT on 11/3 with small Lt hydropneumothorax, persistent Lt consolidation, new Rt lung infection followed by interval chest CT on 11/5 w/ slight inc patchy ground glass opacities Rt lung. Clinically the patient is better, though with persistent leuko and lung consolidation. Continues w/ CT to suction, SLP following, reg diet w/ supervision, TPN remains.     Recommendations: The patient is certainly making significant functional goals with rehab therapies despite CT to suction and persistent dysphagia. Our service continues to recommend acute inpatient rehabilitation pending TPN completion (improved toleration for current oral diet), CT to suction.     Beverly Sessions, MD    Subjective     Javier Gutierrez is a 76 y.o. male.  Patient feels his strength is slowly returning and is anxious to start eating more. He denies N&V, SON, CP.         Current Level Of Function:   PT Gait:Gait Distance: 80 feet (x2 bouts) Gait: Assistance Level:  (contact guard assist) Gait: Assistive Device: Roller Walker (and line management)  Bed Mobility/Transfers  Bed Mobility: Supine to Sit: Moderate Assist, Head of Bed Elevated, Use of Rail, Requires Extra Time, Safety Considerations  Bed Mobility: Sit to Supine: Moderate Assist  Comments: bed mobility not observed, sitting up in bedside chair at entry  Transfer Type: Sit to Stand  Transfer: Assistance Level: From, Bed Side Chair, Minimal Assist (improved technique, requires mod cues for carryover)  Transfer: Assistive Device: Nurse, adult  Transfers: Type Of Assistance: Verbal Cues, Elevated Bed, For Balance, For Strength Deficit, For Safety Considerations  Other Transfer Type: Stand Pivot  Other Transfer: Assistance Level: To, Bed Side Chair, Minimal Assist  Other Transfer: Assistive Device: Nurse, adult  Other Transfer: Type Of Assistance: Verbal Cues, For Strength Deficit, For Safety  Considerations, For Balance  End Of Activity Status: Up in Chair, Instructed Patient to Request Assist with Mobility, Instructed Patient to Use Call Light (TABS alarm in place)  Comments: encouraged bedside commode for nursing staff to preserve energy levels as able/      OT ADL's  Where Assessed: Standing at Sink, In Bathroom  Grooming Assist: Minimal Assist (Hand hygiene standing at the sink)  Grooming Deficits: Setup, Steadying, Verbal Cueing, Supervision/Safety, Increased Time To Complete, Standing With Assistive Device  LE Dressing Assist: Stand By Assist (doff/don socks while seated in bedside chair)  LE Dressing Deficits: Supervision/Safety, Increased Time To Complete  Toileting Assist: Moderate Assist  Toileting Deficits: Steadying, Verbal Cueing, Supervision/Safety, Increased Time To Complete, Grab Bar Use, Perineal Hygiene (to ensure cleanliness)  Functional Transfer Assist: Minimal Assist  Functional Transfer Deficits: Steadying, Verbal Cueing, Supervision/Safety, Increased Time to Complete  Comment: Bed mobility supine to EOB with moderate assist. He stood to walker and ambulated to the bathroom for toileting and hand hygiene, then sat in bedside chair.     SLP COGNITIVE EVALUATION SUMMARY       PRAGMATICS:      BEHAVIOR:      AUDITORY COMPREHENSION:        ORIENTATION:      AUDITORY ATTENTION/WORKING MEMORY:      AUDITORY MEMORY/SUSTAINED ATTENTION:      NEW LEARNING:      SEQUENCING/ORGANIZATION:      PROBLEM SOLVING:      REASONING:        MATH/MONEY SKILLS:        VISUAL PERCEPTUAL:      SWALLOW EVALUATION SUMMARY  Summary: Pt seen for a clinical swallow evaluation. Pt presents w/ moderate-severe oropharyngeal dysphagia suspect given     Oral Stage Summary*: Pt seen w/ thin liquids via teaspoon/cup/straw and nectar via teaspoon/cup. Bolus withdraw from spoon was weak. Bolus formation and AP transfer appeared functional.     Pharyngeal Stage Summary*: Hyolaryngeal elevation appears normal w/ W/ thin via teaspoon small immediate cough upon initial presentation. No overt s/s aspiration w/ additional teaspoon boluses nor w/ cup sips. Large immediate cough present w/ straw. No s/s aspiration w/ nectar via teaspoon however coughs w/ x3 presentations of nectar via cup.         Plan: Continue Treatment __x/week (Comment).    Prognosis: Good           Review of Systems -     A 10 point review of systems was negative accept where reported in the HPI    Medications Scheduled Meds:  aspirin EC tablet 81 mg 81 mg Oral QDAY   carvedilol (COREG) tablet 12.5 mg 12.5 mg Oral BID   cilostazol(+) (PLETAL) tablet 50 mg 50 mg Oral BID before meals   dutasteride (AVODART) capsule 0.5 mg 0.5 mg Oral QDAY   enoxaparin (LOVENOX) syringe 40 mg 40 mg Subcutaneous QDAY(21)   levoFLOXacin (LEVAQUIN) oral solution 750 mg 750 mg Oral Q24H*   levothyroxine (SYNTHROID) tablet 100 mcg 100 mcg Oral QDAY   montelukast (SINGULAIR) tablet 10 mg 10 mg Oral QDAY   pantoprazole (PROTONIX) injection 40 mg 40 mg Intravenous QDAY   piperacillin/tazobactam  (ZOSYN) 4.5 g/100 mL iso-osmotic IVPB 4.5 g Intravenous Q6H*   polyethylene glycol 3350 (MIRALAX) packet 17 g 1 packet Oral BID   senna/docusate (SENOKOT-S) tablet 1 tablet 1 tablet Oral BID   vancomycin (VANCOCIN) 1,250 mg in dextrose 5% (D5W) IVPB 1,250 mg Intravenous Q24H*   Continuous Infusions:  PRN and Respiratory Meds:albuterol Q4H PRN, [DISCONTINUED] vancomycin   IVPB Q12H* **AND** vancomycin, pharmacy to manage Per Pharmacy      Objective                        Vital Signs: Last Filed                 Vital Signs: 24 Hour Range   BP: 126/43 (11/07 1147)  Temp: 36.6 ???C (97.8 ???F) (11/07 1147)  Pulse: 76 (11/07 1147)  Respirations: 18 PER MINUTE (11/07 1147)  SpO2: 94 % (11/07 1147)  O2 Delivery: None (Room Air) (11/07 1147)  Height: 177.8 cm (70) (11/06 1316) BP: (122-159)/(43-60)   Temp:  [36.6 ???C (97.8 ???F)-37 ???C (98.6 ???F)]   Pulse:  [74-83]   Respirations:  [18 PER MINUTE-20 PER MINUTE]   SpO2:  [94 %-100 %]   O2 Delivery: None (Room Air)   Intensity Pain Scale (Self Report): Asleep (11/28/16 0330) Vitals:    11/20/16 0518 11/27/16 1316   Weight: 68 kg (150 lb) 68 kg (150 lb)       Intake/Output Summary:  (Last 24 hours)    Intake/Output Summary (Last 24 hours) at 11/28/16 1238  Last data filed at 11/28/16 0800   Gross per 24 hour   Intake              510 ml   Output              395 ml   Net              115 ml      Stool Occurrence: 1 Oral Diet Order: Cardiac  Last Bowel Movement Date: 11/27/16             Physical Exam  VS: BP 126/43 (BP Source: Arm, Right Upper)  - Pulse 76  - Temp 36.6 ???C (97.8 ???F)  - Ht 177.8 cm (70)  - Wt 68 kg (150 lb)  - SpO2 94%  - BMI 21.52 kg/m???   Gen: AOX3, NAD  HEENT: EOMI, MMM  Heart: Extremtities well perfused   Lungs: Good inspiratory effort without recruitment of accessory muscles   Abd: Soft, non-distended  Psych: Pleasant mood

## 2016-11-29 ENCOUNTER — Inpatient Hospital Stay: Admit: 2016-11-29 | Discharge: 2016-11-29 | Payer: MEDICARE

## 2016-11-29 DIAGNOSIS — R05 Cough: Secondary | ICD-10-CM

## 2016-11-29 LAB — CULTURE-RESP,LOWER W/SENSITIVITY: Lab: LOW

## 2016-11-29 LAB — COMPREHENSIVE METABOLIC PANEL: Lab: 129 MMOL/L — ABNORMAL LOW (ref >60)

## 2016-11-29 LAB — CBC AND DIFF: Lab: 15 K/UL — ABNORMAL HIGH (ref >60)

## 2016-11-29 NOTE — Progress Notes
PHYSICAL THERAPY  PROGRESS NOTE       MOBILITY:  Mobility  Progressive Mobility Level: Walk in hallway  Distance Walked (feet): 120 ft  Level of Assistance: Assist X1  Assistive Device: Walker  Activity Limited By: Weakness    SUBJECTIVE:  Subjective  Significant hospital events: PMH significant for HTN, HLD, CAD, aortic stenosis s/p TAVR, CKD, PAD, lifetime smoker, chronic diastolic heart failure. Admitted 11/20/16 with weakness and cough. Found atelectasis and L PE with chest tube placed.   Mental / Cognitive Status: Alert;Cooperative;Follows Commands  Persons Present: Son  Pain: Patient has no complaint of pain  Pain Interventions: Patient agrees to participate in therapy  Precautions:  (chest tube to wall suction)  Ambulation Assist: Independent Mobility in Community with Device  Patient Owned Equipment: Nurse, adult  Home Situation: Lives Alone  Type of Home: Apartment (independent living)  Entry Stairs: No Stairs  In-Home Stairs: No Stairs       BED MOBILITY/TRANSFERS:  Bed Mobility/Transfers  Bed Mobility: Supine to Sit: Minimal Assist;Head of Bed Elevated;Assist with Trunk (mildly)  Transfer Type: Sit to Stand  Transfer: Assistance Level: From;Bed;Minimal Assist (improved technique, requires mod cues for carryover)  Transfer: Assistive Device: Nurse, adult  Transfers: Type Of Assistance: Verbal Cues;Elevated Bed;For Balance;For Strength Deficit;For Safety Considerations  Other Transfer Type: Stand Pivot  Other Transfer: Assistance Level: To;Bed Side Chair;Minimal Assist  Other Transfer: Assistive Device: Nurse, adult  Other Transfer: Type Of Assistance: Verbal Cues;For Strength Deficit;For Safety Considerations;For Balance  End Of Activity Status: Up in Chair;Instructed Patient to Request Assist with Mobility;Instructed Patient to Use Call Light (TABS alarm in place, son at bedside)         GAIT:  Gait  Gait Distance: 120 feet  Gait: Assistance Level:  (contact guard assist) Gait: Assistive Device: Nurse, adult (and Comptroller)  Gait: Descriptors: Pace: Slow;Forward trunk flexion  Comments: patient with 1 mild loss of balance due to increased distance and fatigue. requires minimal assist to regain balance  Activity Limited By: Complaint of Fatigue;Weakness       ASSESSMENT/PROGRESS:  Assessment/Progress  Impaired Mobility Due To: Decreased Strength;Impaired Balance;Decreased Activity Tolerance;Deconditioning  Assessment/Progress: Should Improve w/ Continued PT  Comments: patient continues to improve mobility status. occassionally requires encouragement to continue mobilization, although easily redirected. will continue to progress mobility as able. anticipate further increased mobility when lines disconitnued.       AM-PAC 6 Clicks Basic Mobility Inpatient  Turning from your back to your side while in a flat bed without using bed rails: A Little  Moving from lying on your back to sitting on the side of a flatbed without using bedrails : A Lot  Moving to and from a bed to a chair (including a wheelchair): A Little  Standing up from a chair using your arms (e.g. wheelchair, or bedside chair): A Little  To walk in hospital room: A Little  Climbing 3-5 steps with a railing: A Lot  Raw Score: 16  Standardized (T-scale) Score: 38.32  Basic Mobility CMS 0-100%: 47.12  CMS G Code Modifier for Basic Mobility: CK    GOALS:  Goals  Goal Formulation: With Patient/Family  Time For Goal Achievement: 5 days  Pt Will Go Supine To/From Sit: w/ Stand By Assist  Pt Will Transfer Bed/Chair: w/ Stand By Assist  Pt Will Transfer Sit to Stand: w/ Stand By Assist  Pt Will Ambulate: Greater than 200 Feet, w/ Dan Humphreys, w/ Stand By Assist    PLAN:  Plan  Treatment Interventions: Mobility Training;Strengthening;Balance Activities;Endurance Training  Plan Frequency: 5 Days per Week  PT Plan for Next Visit: increase time out of bed to chair, increase gait distance as tolerated, endurance training RECOMMENDATIONS:  PT Discharge Recommendations  PT Discharge Recommendations: Inpatient Setting;Recommend Physical Medicine and Rehabilitation Consult to address most appropriate level of rehabilitation placement (as patient with generalized deconditioning and lives alone)  Equipment Recommendations: Nurse, adult  Recommend ongoing assistance for: Transfers;Bed mobility;Ambulation;Stairs;Safety concerns;In and out of house    Therapist: Joice Lofts Wladyslawa Disbro  Date: 11/29/2016

## 2016-11-29 NOTE — Progress Notes
Pulmonary Progress Note      Admission Date: 11/20/2016  LOS: 9 days                     Assessment/Plan:    Principal Problem:    Parapneumonic effusion  Active Problems:    Tobacco use disorder    CAD (coronary artery disease)    Chronic diastolic (congestive) heart failure (HCC)    Pneumonia    Sepsis (HCC)    Acute respiratory failure with hypoxia (HCC)    Hyponatremia      ZHI CHAPLA is a 76 y.o. male admitted for progressive weakness and cough. Patient was found to have left sided pleural effusion and pneumonia.   He had a chest tube placed for radiographically complicated parapneumonic effusion(now empyema) on 11/20/16. Pleural fluid grew Streptococcus intermedius which is an oral pathogen.   He is not having any fevers and his wbc is stable  His 60fr CT output was 120cc last 24hours.   He has developed some GG changes on the right with trace pleural effusion as well. We have to keep monitoring these.   CT reviewed, stable findings. Given slow recovery, development of new GG changes on the right and concern for endobronchial lesion in LLL, we will proceed with a bronchoscopy tomorrow  CT surgery has evaluated him and there is no plan for surgical intervention at this point.    - Likely Aspiration pneumonia  - left sided Empyema with streptococcus intermedius  - Atelectasis, compressive from effusion versus endobronchial lesion  - Developing right sided infiltrates and small effusion  - tobacco use  - AS s/p TAVR 07/2015      Specific Recs for today:  - Continue with abx per primary team, (currently Vanc/Zosyn)  - Bronch with BAL in am  - Will need at least 4-6 weeks of antibiotics, ID following  - s/p 2 rounds of intrapleural t-PA/Dornase  - keep CT to -20cm wall suction for now  - Continue aggressive Pulmonary hygiene (IS, flutter valve, PT/OT, up and out of bed when able)      M.Wynelle Beckmann, MD  Pulmonary and Critical Care Fellow  Pager [phone] Patient was seen and discussed w/ Dr. Rondel Oh. Please see his/her attestation for updates to recommendations.  ________________________________________________________________________    Subjective  Patient appears to be stable, slow improvement every day  No new complaints  Producing sputum with weak cough  Walked in the hallway again yesterday    ROS:  Weakness  No fevers or chills    Medications  Scheduled Meds:    aspirin EC tablet 81 mg 81 mg Oral QDAY   carvedilol (COREG) tablet 12.5 mg 12.5 mg Oral BID   cilostazol(+) (PLETAL) tablet 50 mg 50 mg Oral BID before meals   dutasteride (AVODART) capsule 0.5 mg 0.5 mg Oral QDAY   enoxaparin (LOVENOX) syringe 40 mg 40 mg Subcutaneous QDAY(21)   levothyroxine (SYNTHROID) tablet 100 mcg 100 mcg Oral QDAY   montelukast (SINGULAIR) tablet 10 mg 10 mg Oral QDAY   pantoprazole (PROTONIX) injection 40 mg 40 mg Intravenous QDAY   piperacillin/tazobactam  (ZOSYN) 4.5 g/100 mL iso-osmotic IVPB 4.5 g Intravenous Q6H*   polyethylene glycol 3350 (MIRALAX) packet 17 g 1 packet Oral BID   senna/docusate (SENOKOT-S) tablet 1 tablet 1 tablet Oral BID   vancomycin (VANCOCIN) 1,250 mg in dextrose 5% (D5W) IVPB 1,250 mg Intravenous Q24H*   Continuous Infusions:  PRN and Respiratory Meds:albuterol Q4H PRN, [DISCONTINUED] vancomycin  IVPB Q12H* **AND** vancomycin, pharmacy to manage Per Pharmacy        Objective                       Vital Signs: Last Filed                 Vital Signs: 24 Hour Range   BP: 127/55 (11/08 1015)  Temp: 36.5 ???C (97.7 ???F) (11/08 1015)  Pulse: 76 (11/08 1015)  Respirations: 20 PER MINUTE (11/08 1015)  SpO2: 94 % (11/08 1015)  O2 Delivery: None (Room Air) (11/08 1015) BP: (122-144)/(43-58)   Temp:  [36.3 ???C (97.4 ???F)-37 ???C (98.6 ???F)]   Pulse:  [76-83]   Respirations:  [16 PER MINUTE-20 PER MINUTE]   SpO2:  [94 %-97 %]   O2 Delivery: None (Room Air)   Intensity Pain Scale (Self Report): Asleep (11/29/16 0330) Vitals:    11/20/16 0518 11/27/16 1316 Weight: 68 kg (150 lb) 68 kg (150 lb)       Intake/Output Summary:  (Last 24 hours)    Intake/Output Summary (Last 24 hours) at 11/29/16 1135  Last data filed at 11/29/16 1019   Gross per 24 hour   Intake              802 ml   Output             1215 ml   Net             -413 ml      Stool Occurrence: 1    Physical Exam  Gen: Alert, NAD.  HEENT: Normocephalic, atraumatic. EOMI, no oral lesions.  RESP: still diminished with decreased BS at left and right base, left sided chest tube,  no accessory muscle use, normal WOB.  CVS: RRR, no obvious murmurs, rubs or gallops. No JVD.  ABD: Soft, non-tender, non-distended, BS+  Ext: No rashes, no edema, no cyanosis  Neuro: Alert and oriented, fluent speech, symmetric face  Psych: Mood stable.  Skin: No rashes on exposed skin. Warm and dry.      Lab Review  Results for orders placed or performed during the hospital encounter of 11/20/16 (from the past 24 hour(s))   CBC AND DIFF    Collection Time: 11/29/16  6:03 AM   # # Low-High    White Blood Cells 15.9 (H) 4.5 - 11.0 K/UL    RBC 2.55 (L) 4.4 - 5.5 M/UL    Hemoglobin 7.5 (L) 13.5 - 16.5 GM/DL    Hematocrit 16.1 (L) 40 - 50 %    MCV 88.6 80 - 100 FL    MCH 29.5 26 - 34 PG    MCHC 33.3 32.0 - 36.0 G/DL    RDW 09.6 (H) 11 - 15 %    Platelet Count 601 (H) 150 - 400 K/UL    MPV 6.7 (L) 7 - 11 FL    Neutrophils 82 (H) 41 - 77 %    Lymphocytes 8 (L) 24 - 44 %    Monocytes 8 4 - 12 %    Eosinophils 2 0 - 5 %    Basophils 0 0 - 2 %    Absolute Neutrophil Count 12.80 (H) 1.8 - 7.0 K/UL    Absolute Lymph Count 1.30 1.0 - 4.8 K/UL    Absolute Monocyte Count 1.30 (H) 0 - 0.80 K/UL    Absolute Eosinophil Count 0.40 0 - 0.45 K/UL    Absolute Basophil Count 0.00  0 - 0.20 K/UL   COMPREHENSIVE METABOLIC PANEL    Collection Time: 11/29/16  6:03 AM   # # Low-High    Sodium 129 (L) 137 - 147 MMOL/L    Potassium 3.8 3.5 - 5.1 MMOL/L    Chloride 97 (L) 98 - 110 MMOL/L    Glucose 91 70 - 100 MG/DL    Blood Urea Nitrogen 12 7 - 25 MG/DL Creatinine 6.96 0.4 - 2.95 MG/DL    Calcium 7.4 (L) 8.5 - 10.6 MG/DL    Total Protein 5.0 (L) 6.0 - 8.0 G/DL    Total Bilirubin 0.3 0.3 - 1.2 MG/DL    Albumin 1.8 (L) 3.5 - 5.0 G/DL    Alk Phosphatase 36 25 - 110 U/L    AST (SGOT) 16 7 - 40 U/L    CO2 27 21 - 30 MMOL/L    ALT (SGPT) 9 7 - 56 U/L    Anion Gap 5 3 - 12    eGFR Non African American >60 >60 mL/min    eGFR African American >60 >60 mL/min        Point of Care Testing  (Last 24 hours)  Glucose: 91  POC Glucose (Download): (!) 134  Hemoccult: Negative    Radiology and other Diagnostics Review:  I have personally reviewed the radiology results of the last 24 hours and looked at chest xray and ct findings

## 2016-11-29 NOTE — Discharge Instructions
Continue to supplement your meals with boost plus at breakfast, lunch, and dinner.     Do NOT use straws.

## 2016-11-29 NOTE — Progress Notes
RT Adult Assessment Note    NAME:Javier Gutierrez             MRN: [id number]             DOB:[birth year]          AGE: 76 y.o.  ADMISSION DATE: 11/20/2016             DAYS ADMITTED: LOS: 9 days    RT Treatment Plan:  Protocol Plan: Medications  Albuterol: MDI PRN    Protocol Plan: Procedures  Vibrating PEP Therapy:  (pt doing on own with help from family)  PAP: Place a nursing order for "IS Q1h While Awake" for any of Lung Expansion indicators    Additional Comments:  Impressions of the patient: Resting in bed. Using vib pep and IS every hour with assistance from family. Weak cough but able to remove secretions with oral suction.  Intervention(s)/outcome(s): none  Patient education that was completed: none  Recommendations to the care team: none    Vital Signs:  Pulse: Pulse: 82  RR: Respirations: 18 PER MINUTE  SpO2: SpO2: 97 %  O2 Device:    Liter Flow:    O2%: O2 Percent: 21 %  Breath Sounds:    Respiratory Effort: Respiratory Effort: Non-Labored

## 2016-11-29 NOTE — Patient Education
Medication Education    Javier Moynahanhomas Conkel accepted counseling and was receptive.  he verbalized understanding.    The following medications were discussed:  Coreg  Pletal  Lovenox  Synthroid  Zosyn  Miralax (refused)  Senokot (refused)  Vancomycin    RN provided additional education on high fall risk bundle, mobility, portable suction, q2 turns, diet orders and plan of care.     Where indicated, the patient was provided with additional medication and/or disease-state information.  All patient questions were answered and patient acknowledged understanding of the medications, side effects and other pertinent medication information.    Follow up should occur daily.    Continue to address: indications    Doy MinceMadison Yaresly Menzel, RN

## 2016-11-29 NOTE — Progress Notes
OCCUPATIONAL THERAPY  NO TREATMENT NOTE       Patient declines activity stating "I already did that today." Patient states he feels uncomfortable in the chair and wants to take a nap but declines to transfer back to bed or any form of activity. Despite encouragement and education, patient continues to state "I'm staying right here" and puts head down and closes eyes. Will continue to follow and provide intervention as indicated.     Therapist: Theotis BurrowMegan , OTA [phone]  Date: 11/29/2016

## 2016-11-29 NOTE — Progress Notes
Name:  Javier Gutierrez                                               MRN:  [id number]   Admission Date:  11/20/2016  Today's Date: 11/28/16  LOS: 8      ASSESSMENT AND PLAN     76 year old man with history of hypertension, hyperlipidemia, coronary artery disease, aortic stenosis, chronic kidney disease, peripheral artery disease, and lifetime smoker who presents with concern for weakness and cough. ???Per daughter he is reported to have a fall one week ago where he was down for 15 hours and generalized worsening fatigue over the last few weeks.  ???  Complicated parapneumonic effusion  - CT Chest 11/20/16: Consolidation of the majority of the left mid and lower lung, most compatible with pneumonia and partially loculated pleural effusion. Underlying neoplastic endobronchial lesion possible.  - LDH 293, white count 1400, pH of pleural fluid 7.29, cytology negative for malignant cells  - pleural fluid culture STREPTOCOCCUS INTERMEDIUS sensitive to PCN  - urine strep and leg Ag are negative, RVP negative  - s/p chest tube placement 11/20/16  - repeat CT 11/24/16 scan showed development of scattered opacities throughout the right lung that is most suggestive of pneumonia (new), Small, partially loculated left-sided hydropneumothorax with a pleural drain in place. ???The pleural fluid has mildly decreased and the pleural gas has developed since the October 30  - because of now new right sided ground glass opacities as well as up trending WBC, added Levaquin 11/24/16 on top of zosyn and vanc  - pulmonary following: Resume intrapleural t-PA/Dornase for 6 more doses, keep CT to -20cm wall suction when not clamped with t-pa or dornase, continue aggressive Pulmonary hygiene  - Nov 5: ID consulted recommended continue current antibiotics.   - Nov5 : CTS consulted. No surgical intervention   - Nov 5: CT showed Slight??? increase??? in??? mild??? patchy??? groundglass??? opacities??? throughout??? the??? right??? lung. Unchanged??? small??? right??? pleural??? effusion.  - Nov6: discussed with pulm. No urgent plan for bronchoscopy  - will stop levaquin (complete 5 days course)  -Repeated chest CT by pulmonary on November 7 reviewed.  No new changes  - will continue Chest tube until the drainage is less than 30mL /24hours    Persistent leucocytosis/ Sepsis   - Echo was basically negative for any vegetation. Altho the study was difficult, in the setting of low clinical suspicion, no need for more testing.   - Speech therapy done Nov 5.   - procalcitonin slightly elevated     Anemia and thrombocytosis  - due to chronic inflammation as per iron panel.   - got consent in chart  - transfuse if Hb less 7  - if symptomatic, we can transfuse to keep more than 8  - cont PPI  - check FOBT  ???  CAD  - Continue PTA Aspirin/COreg  - no chest pain  ???  Recent Mechanical fall  - CT head negative  - CT C spine was suspicious for possible lytic lesions to the c spine  - Consult PT/OT for eval  ???  HLD/HTN  - He is of his simvastatin and fenofibrate due to reported muscle weakness since last Thursday Nov 1st  - Continue PTA coreg and hydralazine  ???  Hypothyroidism  - TSH/Free T4 normal  - Continue PTA synthroid  ???  Aortic Valve stenosis   - S/P TAVR in 2017  ???  Euvolemic hypoNa  - asymptomatic   -  Urine osmol 227 and Na 70  - serum osmo 268  - TSH WNL. am cortisol 15, WNL  - due to poor salt intake?   - will change diet to regular diet     Anorexia, mild protein cal malnutrition?   - multiple etiology   - could be related to antibiotics, current illness, anemia, disliking hospital food, ...   - change to regular diet.   - cont supplement    PT/OT recommneds inpatient setting    Total floor/unit time (reviewing and writing notes, examining the patient, reviewing test results etc) spent was 35 minutes of which > 50% was spent in care coordination  and bedside counseling (explaining treatment options, disease processes, laboratory/imaging results, prognosis, risks and benefits of treatment options, medication side effects, importance of compliance with treatment, risk factor reduction, follow up with primary care physician).   - Discussed with ID, daughter and brother at bedside Nov 5 as well as at the huddle.   - discussed with son on Nov 6  - discussed with daughter and brother Nov 7    SUBJECTIVE   Patient seen and examined.    Overnight:   No interval events. daughter at bedside   During this AM visit, patient states feeling better, appetite improving too  ROS:  >>> Denies fever/chills            >>> Denies chest pain, SOB            >>> Denies  N/V/D            >>> Denies dizziness/lightheadedness   + for anorexia        OBJECTIVE     VITAL SIGNS   BP: 126/43 (11/07 1147)  Temp: 36.6 ???C (97.8 ???F) (11/07 1147)  Pulse: 76 (11/07 1147)  Respirations: 18 PER MINUTE (11/07 1147)  SpO2: 94 % (11/07 1147)  O2 Delivery: None (Room Air) (11/07 1147)       PHYSICAL EXAM   General: ???Alert and response to question appropriately. ???VS as above. No acute distress . ??????  Eyes: No conjunctival injection. ???Pupil equal on both size?????????No scleral icterus. ???  Ears, nose, mouth, and throat : No erythema. ??????MMM. ??????No erythema, exudate noted. ???  Neck: ???Symmetric appearance without crepitus, no obvious mass or noticeable swelling,   Lungs: ???No use of accessory muscles. ???Clear to auscultation bilaterally without wheezes, rales, or rhonchi. Chest tube in place. Decrease BS at the bases more over the L lung  Cardiovascular: Regular rate, S1, S2 normal, no clicks, rubs, or gallops appreciated . ???2+ and symmetric, all extremities. ???No edema in BLE.   Abdomen: ???BS+, soft, ND, ???no guarding or rigidity. ???Nontender to palpation.  Skin: Skin color normal, no obvious evidence of rashes.   Musculoskeletal: ???Normal 5/5 ???hand-grip strength and ???distal strength in BLE. ???ROM normal. No joints swelling or erythema/tenderness   Neurologic: ???Alerted and oriented x 3. ???5/5 hand-grip and distal strenght. Normal ROM. ???No Sensation grossly intact. ???No focal weakness. ???  Psych:??????Alerted and oriented, calm, normal affect    LAB REVIEW     Recent Labs      11/27/16   0543  11/27/16   0830  11/28/16   0549   WBC  14.8*   --   15.5*   HGB  6.9*  7.0*  7.3*   HCT  20.6*  21.0*  21.8*   PLTCT  586*   --   630*   MCV  88.3   --   87.6     Recent Labs      11/27/16   0543  11/28/16   0549   NA  130*  130*   K  3.6  3.7   CL  99  100   CO2  26  26   BUN  15  13   CR  0.86  0.89   GAP  5  4   GFR  >60  >60   ALBUMIN  1.6*  1.7*   TOTBILI  0.3  0.3   AST  17  21   ALT  8  14       MEDICATIONS   Scheduled Meds:    aspirin EC tablet 81 mg 81 mg Oral QDAY   carvedilol (COREG) tablet 12.5 mg 12.5 mg Oral BID   cilostazol(+) (PLETAL) tablet 50 mg 50 mg Oral BID before meals   dutasteride (AVODART) capsule 0.5 mg 0.5 mg Oral QDAY   enoxaparin (LOVENOX) syringe 40 mg 40 mg Subcutaneous QDAY(21)   levothyroxine (SYNTHROID) tablet 100 mcg 100 mcg Oral QDAY   montelukast (SINGULAIR) tablet 10 mg 10 mg Oral QDAY   pantoprazole (PROTONIX) injection 40 mg 40 mg Intravenous QDAY   piperacillin/tazobactam  (ZOSYN) 4.5 g/100 mL iso-osmotic IVPB 4.5 g Intravenous Q6H*   polyethylene glycol 3350 (MIRALAX) packet 17 g 1 packet Oral BID   senna/docusate (SENOKOT-S) tablet 1 tablet 1 tablet Oral BID   vancomycin (VANCOCIN) 1,250 mg in dextrose 5% (D5W) IVPB 1,250 mg Intravenous Q24H*   Continuous Infusions:  PRN and Respiratory Meds:albuterol Q4H PRN, [DISCONTINUED] vancomycin   IVPB Q12H* **AND** vancomycin, pharmacy to manage Per Pharmacy      RADIOLOGY AND OTHER DIAGNOSTIC PROCEDURES REVIEW       iology and Other Diagnostic Procedures Review:    11/28/2016    Pertinent radiology reviewed.

## 2016-11-30 ENCOUNTER — Encounter: Admit: 2016-11-30 | Discharge: 2016-11-30 | Payer: MEDICARE

## 2016-11-30 ENCOUNTER — Inpatient Hospital Stay: Admit: 2016-11-30 | Discharge: 2016-11-30 | Payer: MEDICARE

## 2016-11-30 DIAGNOSIS — I6529 Occlusion and stenosis of unspecified carotid artery: ICD-10-CM

## 2016-11-30 DIAGNOSIS — J45909 Unspecified asthma, uncomplicated: ICD-10-CM

## 2016-11-30 DIAGNOSIS — E039 Hypothyroidism, unspecified: ICD-10-CM

## 2016-11-30 DIAGNOSIS — M1990 Unspecified osteoarthritis, unspecified site: ICD-10-CM

## 2016-11-30 DIAGNOSIS — I350 Nonrheumatic aortic (valve) stenosis: ICD-10-CM

## 2016-11-30 DIAGNOSIS — E785 Hyperlipidemia, unspecified: ICD-10-CM

## 2016-11-30 DIAGNOSIS — K635 Polyp of colon: ICD-10-CM

## 2016-11-30 DIAGNOSIS — R54 Age-related physical debility: ICD-10-CM

## 2016-11-30 DIAGNOSIS — I10 Essential (primary) hypertension: ICD-10-CM

## 2016-11-30 DIAGNOSIS — Z720 Tobacco use: ICD-10-CM

## 2016-11-30 DIAGNOSIS — I5023 Acute on chronic systolic (congestive) heart failure: ICD-10-CM

## 2016-11-30 DIAGNOSIS — Z8679 Personal history of other diseases of the circulatory system: ICD-10-CM

## 2016-11-30 DIAGNOSIS — I429 Cardiomyopathy, unspecified: ICD-10-CM

## 2016-11-30 DIAGNOSIS — I2510 Atherosclerotic heart disease of native coronary artery without angina pectoris: ICD-10-CM

## 2016-11-30 DIAGNOSIS — I7090 Unspecified atherosclerosis: ICD-10-CM

## 2016-11-30 LAB — LEUKEMIA/LYMPHOMA PANEL FLUID/TISSUE

## 2016-11-30 LAB — COMPREHENSIVE METABOLIC PANEL: Lab: 131 MMOL/L — ABNORMAL LOW (ref 137–147)

## 2016-11-30 LAB — CBC AND DIFF: Lab: 13 K/UL — ABNORMAL HIGH (ref >60)

## 2016-11-30 MED ORDER — TETRACAINE(#) 0.25%/EPINEPHRINE 0.003% IJ SOLN
30 mL | Freq: Once | INTRAMUSCULAR | 0 refills | Status: AC
Start: 2016-11-30 — End: ?

## 2016-11-30 MED ORDER — PROPOFOL INJ 10 MG/ML IV VIAL
0 refills | Status: DC
Start: 2016-11-30 — End: 2016-11-30
  Administered 2016-11-30: 20:00:00 70 mg via INTRAVENOUS

## 2016-11-30 MED ORDER — DEXAMETHASONE SODIUM PHOSPHATE 4 MG/ML IJ SOLN
INTRAVENOUS | 0 refills | Status: DC
Start: 2016-11-30 — End: 2016-11-30
  Administered 2016-11-30: 21:00:00 4 mg via INTRAVENOUS

## 2016-11-30 MED ORDER — FENTANYL CITRATE (PF) 50 MCG/ML IJ SOLN
50 ug | INTRAVENOUS | 0 refills | Status: DC | PRN
Start: 2016-11-30 — End: 2016-11-30

## 2016-11-30 MED ORDER — ONDANSETRON HCL (PF) 4 MG/2 ML IJ SOLN
INTRAVENOUS | 0 refills | Status: DC
Start: 2016-11-30 — End: 2016-11-30
  Administered 2016-11-30: 21:00:00 4 mg via INTRAVENOUS

## 2016-11-30 MED ORDER — FENTANYL CITRATE (PF) 50 MCG/ML IJ SOLN
0 refills | Status: DC
Start: 2016-11-30 — End: 2016-11-30
  Administered 2016-11-30: 20:00:00 25 ug via INTRAVENOUS

## 2016-11-30 MED ORDER — LACTATED RINGERS IV SOLP
1000 mL | INTRAVENOUS | 0 refills | Status: AC
Start: 2016-11-30 — End: ?
  Administered 2016-11-30: 20:00:00 1000 mL via INTRAVENOUS

## 2016-11-30 MED ORDER — ONDANSETRON HCL (PF) 4 MG/2 ML IJ SOLN
4 mg | Freq: Once | INTRAVENOUS | 0 refills | Status: DC | PRN
Start: 2016-11-30 — End: 2016-11-30

## 2016-11-30 NOTE — Progress Notes
PHYSICAL THERAPY  NOTE       Patient was unavailable for physical therapy. Patient off the unit for bronch. Physical therapy will continue to follow and provide intervention as indicated.      Therapist: Barnett HatterAmber Shaunessy Dobratz  Date: 11/30/2016

## 2016-11-30 NOTE — Interval H&P Note
History and Physical Update Note    Allergies:  Sulfa (sulfonamide antibiotics)    Lab/Radiology/Other Diagnostic Tests:  24-hour labs:    Results for orders placed or performed during the hospital encounter of 11/20/16 (from the past 24 hour(s))   CBC AND DIFF    Collection Time: 11/30/16  5:46 AM   Result Value Ref Range    White Blood Cells 13.0 (H) 4.5 - 11.0 K/UL    RBC 2.36 (L) 4.4 - 5.5 M/UL    Hemoglobin 7.0 (L) 13.5 - 16.5 GM/DL    Hematocrit 42.5 (L) 40 - 50 %    MCV 89.5 80 - 100 FL    MCH 29.6 26 - 34 PG    MCHC 33.0 32.0 - 36.0 G/DL    RDW 95.6 (H) 11 - 15 %    Platelet Count 539 (H) 150 - 400 K/UL    MPV 6.8 (L) 7 - 11 FL    Neutrophils 77 41 - 77 %    Lymphocytes 10 (L) 24 - 44 %    Monocytes 10 4 - 12 %    Eosinophils 3 0 - 5 %    Basophils 0 0 - 2 %    Absolute Neutrophil Count 10.10 (H) 1.8 - 7.0 K/UL    Absolute Lymph Count 1.30 1.0 - 4.8 K/UL    Absolute Monocyte Count 1.30 (H) 0 - 0.80 K/UL    Absolute Eosinophil Count 0.30 0 - 0.45 K/UL    Absolute Basophil Count 0.00 0 - 0.20 K/UL   COMPREHENSIVE METABOLIC PANEL    Collection Time: 11/30/16  5:46 AM   Result Value Ref Range    Sodium 131 (L) 137 - 147 MMOL/L    Potassium 3.9 3.5 - 5.1 MMOL/L    Chloride 101 98 - 110 MMOL/L    Glucose 100 70 - 100 MG/DL    Blood Urea Nitrogen 13 7 - 25 MG/DL    Creatinine 3.87 0.4 - 1.24 MG/DL    Calcium 7.6 (L) 8.5 - 10.6 MG/DL    Total Protein 4.8 (L) 6.0 - 8.0 G/DL    Total Bilirubin 0.2 (L) 0.3 - 1.2 MG/DL    Albumin 1.7 (L) 3.5 - 5.0 G/DL    Alk Phosphatase 36 25 - 110 U/L    AST (SGOT) 14 7 - 40 U/L    CO2 25 21 - 30 MMOL/L    ALT (SGPT) 9 7 - 56 U/L    Anion Gap 5 3 - 12    eGFR Non African American >60 >60 mL/min    eGFR African American >60 >60 mL/min   TYPE & CROSSMATCH    Collection Time: 11/30/16 10:51 AM   Result Value Ref Range    Units Ordered 1     Crossmatch Expires 12/03/2016     Record Check FOUND     ABO/RH(D) O POS     Antibody Screen NEG     Electronic Crossmatch YES Unit Number [id number]     Blood Component Type RBC,ADSOL,LEUKO REDUCED     Unit Division 0     Status OF Unit ISSUED     Transfusion Status OK TO TRANSFUSE     Crossmatch Result COMPATIBLE,ELECTRONIC      Point of Care Testing:  (Last 24 hours):  Glucose: 100 (11/30/16 0546)      I have examined the patient, and there are no significant changes in their condition, from the previous H&P performed on 11/20/16.  Lucretia Field, MD  Pager [phone]    --------------------------------------------------------------------------------------------------------------------------------------------

## 2016-11-30 NOTE — Anesthesia Post-Procedure Evaluation
Post-Anesthesia Evaluation    Name: Javier Gutierrez      MRN: [id number]     DOB: [birth year]     Age: 76 y.o.     Sex: male   __________________________________________________________________________     Procedure Date: 11/30/2016  Procedure: Procedure(s) with comments:  BRONCHOSCOPY - CASE LENGTH 1 HOUR  BRONCHOSCOPY WITH BRONCHIAL ALVEOLAR LAVAGE - FLEXIBLE      Surgeon: Surgeon(s):  Audie BoxPitts, Lucas, MD    Post-Anesthesia Vitals  BP: 161/63 (11/09 1600)  Temp: 36.8 C (98.2 F) (11/09 1600)  Pulse: 82 (11/09 1600)  Respirations: 21 PER MINUTE (11/09 1600)  SpO2: 97 % (11/09 1600)  O2 Delivery: Nasal Cannula (11/09 1600)  SpO2 Pulse: 82 (11/09 1600)      Post Anesthesia Evaluation Note    Evaluation location: Pre/Post  Patient participation: recovered; patient participated in evaluation  Level of consciousness: alert    Pain score: 3  Pain management: adequate    Hydration: normovolemia  Temperature: 36.0C - 38.4C  Airway patency: adequate    Perioperative Events  Perioperative events:  no       Post-op nausea and vomiting: no PONV    Postoperative Status  Cardiovascular status: hemodynamically stable  Respiratory status: spontaneous ventilation and supplemental oxygen (patient at 2L O2 which he was on preoperatively)        Perioperative Events  Perioperative Event: No  Emergency Case Activation: No

## 2016-11-30 NOTE — Progress Notes
OCCUPATIONAL THERAPY  PROGRESS NOTE    Patient Name: Javier Gutierrez                   Room/Bed: FA2130/86  Admitting Diagnosis:       Mobility  Progressive Mobility Level: Walk in hallway  Distance Walked (feet): 80 ft  Level of Assistance: Assist X1  Assistive Device: Walker  Time Tolerated: 11-30 minutes  Activity Limited By: Weakness;Fatigue    Subjective  Pertinent Dx per Physician: PMH significant for HTN, HLD, CAD, aortic stenosis s/p TAVR, CKD, PAD, lifetime smoker, chronic diastolic heart failure. Admitted 11/20/16 with weakness and cough. Found atelectasis and L PE with chest tube placed.   Precautions: Falls  Pain / Complaints: Patient has no c/o pain;Patient agrees to participate in therapy    Objective  Persons Present: Son;Daughter    Home Living  Type of Home: Apartment (independent living)  Home Layout: One Level  Financial risk analyst / Tub: Medical sales representative: Standard  Bathroom Equipment: Engineer, materials in [employer] Equipment: Environmental consultant (did not use prior to fall a week ago)    Prior Function  Level Of Independence: Independent with ADLs and functional transfers;Needed assistance with homemaking  Lives With: Alone  Receives Help From: Family  Other Function Comments: ILF does not provide meals. Pt's daughter states he eats out for breakfast and lunch and snacks the rest of the day. He has someone who comes to clean his apartment.    ADL's  Where Assessed: In Bathroom;Standing at [employer]  Eating Deficits: NPO, but hand to mouth WNL  Grooming Assist: Minimal Assist  Grooming Deficits: Verbal Cueing for sequencing;Supervision/Safety;Standing With Assistive Device;Wash/Dry Hands;Wash/Dry Face  LE Dressing Assist: Minimal Assist  LE Dressing Deficits: Thread RLE Into Underwear (pt able to pull brief up over hips without issue)  Functional Transfer Assist: Minimal Assist  Functional Transfer Deficits: Verbal Cueing (for hand and walker placement prior to sitting and standing ) Comment: In addition to above ADL's, patient ambulates 80 ft. using roller walker, minimal assist and verbal cueing to widen base of support as patient tends to keep feet too close together. No outright loss of balance observed.     Activity Tolerance  Endurance: 3/5 Tolerates 25-30 Minutes Exercise w/Multiple Rests  Comment: Patient needs encouragement to continue activities in order to progress function/endurance. Patient reports he wants to sleep a lot.    Cognition  Comprehension: Hard of Hearing    Assessment  Assessment: Decreased ADL Status;Decreased Cognition;Decreased Endurance;Decreased Self-Care Trans;Decreased High-Level ADLs  Prognosis: Good;w/Cont OT s/p Acute Discharge    AM-PAC 6 Clicks Daily Activity Inpatient  Putting on and taking off regular lower body clothes?: A Lot  Bathing (Including washing, rinsing, drying): A Lot  Toileting, which includes using toilet, bedpan, or urinal: A Lot  Putting on and taking off regular upper body clothing: A Little  Taking care of personal grooming such as brushing teeth: A Little  Eating meals?: None  Daily Activity Raw Score: 16  Standardized (t-scale) score: 35.96  CMS 0-100% Score: 53.32  CMS G Code Modifier: CK    Plan  OT Frequency: 5x/week  OT Plan for Next Visit: donn pants, oral hygiene and wash face with soap/washcloth/water at the sink, endurance building      ADL Goals  Patient Will Perform Grooming: Standing at Sink;w/ Stand By Assist  Patient Will Perform LE Dressing: w/ Stand By Assist  Patient Will Perform Toileting: w/ Stand By Assist  Functional Transfer Goals  Pt Will Perform All Functional Transfers: w/ Stand By Assist      OT Discharge Recommendations  OT Discharge Recommendations: Inpatient Setting, To address deficits, maximize function and improve safety.    Recommend ongoing assistance for: Transfers, Bed mobility, Ambulation, Stairs, Safety concerns, In and out of house        Therapist: Theotis Burrow, Addison Naegeli  [id number]  Date: 11/30/2016

## 2016-11-30 NOTE — Progress Notes
Name:  Javier Gutierrez                                               MRN:  [id number]   Admission Date:  11/20/2016  Today's Date: 11/29/16  LOS: 9      ASSESSMENT AND PLAN     76 year old man with history of hypertension, hyperlipidemia, coronary artery disease, aortic stenosis, chronic kidney disease, peripheral artery disease, and lifetime smoker who presents with concern for weakness and cough.  Found to have complicated empyema.  Chest tube was inserted.  Pulmonary treated the empyema with TPA/dornase.  CT surgery decided there is no surgical indication for decortication.  There was concern on the CT for possible obstruction pneumonia and infection disease recommended to have bronchoscopy.  ???  Complicated parapneumonic effusion  - CT Chest 11/20/16: Consolidation of the majority of the left mid and lower lung, most compatible with pneumonia and partially loculated pleural effusion. Underlying neoplastic endobronchial lesion possible.  - LDH 293, white count 1400, pH of pleural fluid 7.29, cytology negative for malignant cells  - pleural fluid culture STREPTOCOCCUS INTERMEDIUS sensitive to PCN  - urine strep and leg Ag are negative, RVP negative  - s/p chest tube placement 11/20/16  - repeat CT 11/24/16 scan showed development of scattered opacities throughout the right lung that is most suggestive of pneumonia (new), Small, partially loculated left-sided hydropneumothorax with a pleural drain in place. ???The pleural fluid has mildly decreased and the pleural gas has developed since the October 30  - because of now new right sided ground glass opacities as well as up trending WBC, added Levaquin 11/24/16 on top of zosyn and vanc  - pulmonary following: Resume intrapleural t-PA/Dornase for 6 more doses, keep CT to -20cm wall suction when not clamped with t-pa or dornase, continue aggressive Pulmonary hygiene  - Nov 5: ID consulted recommended continue current antibiotics. - Nov5 : CTS consulted. No surgical intervention   - Nov 5: CT showed Slight??? increase??? in??? mild??? patchy??? groundglass??? opacities??? throughout??? the??? right??? lung. Unchanged??? small??? right??? pleural??? effusion.  - Nov6: discussed with pulm. No urgent plan for bronchoscopy  - will stop levaquin (complete 5 days course)  -Repeated chest CT by pulmonary on November 7 reviewed.  No new changes  - will continue Chest tube until the drainage is less than 30mL /24hours  -Plan for bronchoscopy November 9 to rule out obstructive pneumonia    Improving leucocytosis/ Sepsis   - Echo was basically negative for any vegetation. Altho the study was difficult, in the setting of low clinical suspicion, no need for more testing.   - Speech therapy done Nov 5.   - procalcitonin slightly elevated     Anemia and thrombocytosis  - due to chronic inflammation as per iron panel.   - got consent in chart  - transfuse if Hb less 7  - if symptomatic, we can transfuse to keep more than 8  - cont PPI  -Pending FOBT  ???  CAD  - Continue PTA Aspirin/COreg  - no chest pain  ???  Recent Mechanical fall  - CT head negative  - CT C spine was suspicious for possible lytic lesions to the c spine  - Consult PT/OT for eval  ???  HLD/HTN  - He is of his simvastatin and fenofibrate due to reported muscle weakness  since last Thursday Nov 1st  - Continue PTA coreg and hydralazine  ???  Hypothyroidism  - TSH/Free T4 normal  - Continue PTA synthroid  ???  Aortic Valve stenosis   - S/P TAVR in 2017  ???  Euvolemic hypoNa  - asymptomatic   -  Urine osmol 227 and Na 70  - serum osmo 268  - TSH WNL. am cortisol 15, WNL  - due to poor salt intake?   -  regular diet   -We will consider adding sodium chloride tablet    Anorexia, mild protein cal malnutrition?   - multiple etiology   - could be related to antibiotics, current illness, anemia, disliking hospital food, ...   -  regular diet.   - cont supplement    PT/OT recommneds inpatient setting Total floor/unit time (reviewing and writing notes, examining the patient, reviewing test results etc) spent was 35 minutes of which > 50% was spent in care coordination  and bedside counseling (explaining treatment options, disease processes, laboratory/imaging results, prognosis, risks and benefits of treatment options, medication side effects, importance of compliance with treatment, risk factor reduction, follow up with primary care physician).   - Discussed with ID, daughter and brother at bedside Nov 5 as well as at the huddle.   - discussed with son on Nov 6  - discussed with daughter and brother Nov 7  -Discussed with the other son November 8    SUBJECTIVE   Patient seen and examined.    Overnight:   No interval events.  Son at bedside   During this AM visit, patient states feeling better  ROS:  >>> Denies fever/chills            >>> Denies chest pain, SOB            >>> Denies  N/V/D            >>> Denies dizziness/lightheadedness   + for anorexia        OBJECTIVE     VITAL SIGNS   BP: 127/55 (11/08 1015)  Temp: 36.5 ???C (97.7 ???F) (11/08 1015)  Pulse: 82 (11/08 1730)  Respirations: 18 PER MINUTE (11/08 1730)  SpO2: 97 % (11/08 1730)  O2 Delivery: None (Room Air) (11/08 1015)       PHYSICAL EXAM   General: ???Alert and response to question appropriately. ???VS as above. No acute distress . ??????  Eyes: No conjunctival injection. ???Pupil equal on both size?????????No scleral icterus. ???  Ears, nose, mouth, and throat : No erythema. ??????MMM. ??????No erythema, exudate noted. ???  Neck: ???Symmetric appearance without crepitus, no obvious mass or noticeable swelling,   Lungs: ???No use of accessory muscles. ???Clear to auscultation bilaterally without wheezes, rales, or rhonchi. Chest tube in place. Decrease BS at the bases more over the L lung  Cardiovascular: Regular rate, S1, S2 normal, no clicks, rubs, or gallops appreciated . ???2+ and symmetric, all extremities. ???No edema in BLE. Abdomen: ???BS+, soft, ND, ???no guarding or rigidity. ???Nontender to palpation.  Skin: Skin color normal, no obvious evidence of rashes.   Musculoskeletal: ???Normal 5/5 ???hand-grip strength and ???distal strength in BLE. ???ROM normal. No joints swelling or erythema/tenderness   Neurologic: ???Alerted and oriented x 3. ???5/5 hand-grip and distal strenght. Normal ROM. ???No Sensation grossly intact. ???No focal weakness. ???  Psych:??????Alerted and oriented, calm, normal affect    LAB REVIEW     Recent Labs      11/28/16   0549  11/29/16  0603   WBC  15.5*  15.9*   HGB  7.3*  7.5*   HCT  21.8*  22.6*   PLTCT  630*  601*   MCV  87.6  88.6     Recent Labs      11/28/16   0549  11/29/16   0603   NA  130*  129*   K  3.7  3.8   CL  100  97*   CO2  26  27   BUN  13  12   CR  0.89  0.88   GAP  4  5   GFR  >60  >60   ALBUMIN  1.7*  1.8*   TOTBILI  0.3  0.3   AST  21  16   ALT  14  9       MEDICATIONS   Scheduled Meds:    aspirin EC tablet 81 mg 81 mg Oral QDAY   carvedilol (COREG) tablet 12.5 mg 12.5 mg Oral BID   cilostazol(+) (PLETAL) tablet 50 mg 50 mg Oral BID before meals   dutasteride (AVODART) capsule 0.5 mg 0.5 mg Oral QDAY   enoxaparin (LOVENOX) syringe 40 mg 40 mg Subcutaneous QDAY(21)   levothyroxine (SYNTHROID) tablet 100 mcg 100 mcg Oral QDAY   montelukast (SINGULAIR) tablet 10 mg 10 mg Oral QDAY   pantoprazole (PROTONIX) injection 40 mg 40 mg Intravenous QDAY   piperacillin/tazobactam  (ZOSYN) 4.5 g/100 mL iso-osmotic IVPB 4.5 g Intravenous Q6H*   polyethylene glycol 3350 (MIRALAX) packet 17 g 1 packet Oral BID   senna/docusate (SENOKOT-S) tablet 1 tablet 1 tablet Oral BID   vancomycin (VANCOCIN) 1,250 mg in dextrose 5% (D5W) IVPB 1,250 mg Intravenous Q24H*   Continuous Infusions:  PRN and Respiratory Meds:albuterol Q4H PRN, [DISCONTINUED] vancomycin   IVPB Q12H* **AND** vancomycin, pharmacy to manage Per Pharmacy      RADIOLOGY AND OTHER DIAGNOSTIC PROCEDURES REVIEW       iology and Other Diagnostic Procedures Review: 11/29/2016    Pertinent radiology reviewed.

## 2016-11-30 NOTE — Case Management (ED)
Notified by Maye HidesKathleen W (SW) patient will be ready for discharge Monday 11/12 and [facility] to [facility]. [facility] will review patient's case for potential rehab admission. Will f/u with SW re: medical stability and bed availability.     Soledad GerlachEmily Holmes, PT, DPT   Office ext [phone]  [facility] [phone]

## 2016-11-30 NOTE — Progress Notes
CLINICAL NUTRITION                                                        Clinical Nutrition Follow-Up Summary     NAME:Javier Gutierrez             MRN: [id number]             DOB:1940-07-17          AGE: 76 y.o.  ADMISSION DATE: 11/20/2016             DAYS ADMITTED: LOS: 10 days    Nutrition Assessment of Patient:  Malnutrition Assessment: Does not meet criteria  Current Oral Intake: Marginally Adequate  Estimated Calorie Needs: 1700-1840 kcal (25-27 kcal/kg present wt 68kg)  Estimated Protein Needs: 82-88g (1.2-1.3g/kg present wt 68kg)  Oral Diet Order: NPO at Midnight  Oral Supplement: Boost Plus, TID  Intake (calories) Daily Average : 1461 kilocalories (86% kcal needs)  Intake (protein) Daily Average : 53 grams (64% kcal needs)      Comments:  76 year old man with history of hypertension, hyperlipidemia, coronary artery disease, aortic stenosis, CKD, PAD, and lifetime smoker who presents with concern for weakness and cough. Per daughter he is reported to have a fall one week ago where he was down for 15 hours and generalized worsening fatigue over the last few weeks. Noted stage I coccyx pressure injury per RN documentation. Pt is being followed by clinical nutrition services for poor intake PTA & throughout admission. Cleared for regular textures 10/31 following VSS with SLP. OOR for bronchoscopy at time of visit. Per RN, pt eating better with lots of encouragement. No discussion at this time RE: EN, will CTM.     Recommendation:  Continue least restrictive diet textures per SLP. REC liberalizing diet to regular to encourage PO efforts. Encourage intake of Boost Plus (mixed with ice cream, beneprotein) at and between meals. Pt would likely benefit from corpak placement & EN initiation if he becomes agreeable. If corpak placed, REC goal of Isosource 1.5 @ 15mL/hr. H2O bolus Q4hr. Will provide at goal 1800 kcal, 82g protein & free water (EN + water boluses) daily Intervention / Plan:  Monitor PO intake/tolerance, weights, labs, meds, POC          Nutrition Diagnosis:  Inadequate oral intake  Etiology: decreased appetite, lethargy  Signs & Symptoms: son report, calorie count data                      Goals:  PO intake to meet >50% nutritional needs  Time Frame: Within 72 Hours  Status: Met;Ongoing                 Cyndi Lennert, MS, RD, LD  *[phone]

## 2016-11-30 NOTE — Patient Education
Medication Education    Javier Gutierrez accepted counseling and was interactive.  he verbalized understanding.    The following medications were discussed:    pletal  Zosyn  Avodart  singulair  protonix  Senna  NPO  Bronchoscopy        Where indicated, the patient was provided with additional medication and/or disease-state information.  All patient questions were answered and patient acknowledged understanding of the medications, side effects and other pertinent medication information.    Follow up should occur daily.    Continue to address: certain medications    Tomi LikensAshleigh Rodricus Candelaria, RN

## 2016-11-30 NOTE — Patient Education
Medication Education    Javier Gutierrez accepted counseling and was engaged.  he verbalized understanding.    The following medications were discussed:  Coreg  Pletal  Lovenox  Synthroid  Zosyn  Miralax (refused)  Senokot (refused)  Vancomycin    RN provided additional education on high fall risk bundle, mobility, chest tube maintenance and dressing change, q2 turns, diet orders, and plan of care.      Where indicated, the patient was provided with additional medication and/or disease-state information.  All patient questions were answered and patient acknowledged understanding of the medications, side effects and other pertinent medication information.    Follow up should occur daily.    Continue to address: indications    Javier MinceMadison Camala Talwar, RN

## 2016-11-30 NOTE — Progress Notes
Name:  Javier Gutierrez                                               MRN:  [id number]   Admission Date:  11/20/2016  Today's Date: 11/30/16  LOS: 10      ASSESSMENT AND PLAN     76 year old man with history of hypertension, hyperlipidemia, coronary artery disease, aortic stenosis, chronic kidney disease, peripheral artery disease, and lifetime smoker who presents with concern for weakness and cough.  Found to have complicated empyema.  Chest tube was inserted.  Pulmonary treated the empyema with TPA/dornase.  CT surgery decided there is no surgical indication for decortication.  There was concern on the CT for possible obstruction pneumonia and infection disease recommended to have bronchoscopy.  ???  Complicated parapneumonic effusion  - CT Chest 11/20/16: Consolidation of the majority of the left mid and lower lung, most compatible with pneumonia and partially loculated pleural effusion. Underlying neoplastic endobronchial lesion possible.  - LDH 293, white count 1400, pH of pleural fluid 7.29, cytology negative for malignant cells  - pleural fluid culture STREPTOCOCCUS INTERMEDIUS sensitive to PCN  - urine strep and leg Ag are negative, RVP negative  - s/p chest tube placement 11/20/16  - repeat CT 11/24/16 scan showed development of scattered opacities throughout the right lung that is most suggestive of pneumonia (new), Small, partially loculated left-sided hydropneumothorax with a pleural drain in place. ???The pleural fluid has mildly decreased and the pleural gas has developed since the October 30  - because of now new right sided ground glass opacities as well as up trending WBC, added Levaquin 11/24/16 on top of zosyn and vanc  - pulmonary following: Resume intrapleural t-PA/Dornase for 6 more doses, keep CT to -20cm wall suction when not clamped with t-pa or dornase, continue aggressive Pulmonary hygiene  - Nov 5: ID consulted recommended continue current antibiotics. - Nov 5 : CTS consulted. No surgical intervention   - Nov 5: CT showed Slight??? increase??? in??? mild??? patchy??? groundglass??? opacities??? throughout??? the??? right??? lung. Unchanged??? small??? right??? pleural??? effusion.  - DC'd levaquin (complete 5 days course)  - Repeated chest CT by pulmonary on November 7 reviewed.  No new changes  - will continue Chest tube until the drainage is less than 30mL /24hours  - Plan for bronchoscopy today rule out obstructive pneumonia    Improving leucocytosis/ Sepsis   - Echo was basically negative for any vegetation. Altho the study was difficult, in the setting of low clinical suspicion, no need for more testing.   - Speech therapy done Nov 5.   - procalcitonin slightly elevated     Anemia and thrombocytosis  - due to chronic inflammation as per iron panel.   - transfuse 1 unit pRBCs today  - cont PPI  ???  CAD  - Continue PTA Aspirin/COreg  - no chest pain  ???  Recent Mechanical fall  - CT head negative  - CT C spine was suspicious for possible lytic lesions to the c spine  - Consult PT/OT for eval  ???  HLD/HTN  - He is of his simvastatin and fenofibrate due to reported muscle weakness since last Thursday Nov 1st  - Continue PTA coreg and hydralazine  ???  Hypothyroidism  - TSH/Free T4 normal  - Continue PTA synthroid  ???  Aortic Valve stenosis   -  S/P TAVR in 2017  ???  Euvolemic hypoNa  - asymptomatic   -  Urine osmol 227 and Na 70  - serum osmo 268  - TSH WNL. am cortisol 15, WNL  - regular diet     Anorexia, mild protein cal malnutrition?   - multiple etiology   - could be related to antibiotics, current illness, anemia, disliking hospital food  - regular diet.   - cont supplement    PT/OT recommneds inpatient setting    FEN  - lytes stable  - no IVF  - NPO for procedure, then regular diet    Ppx  - SCDs, holding chemical ppx for procedure    Disp  - continue inpatient care    SUBJECTIVE   Patient seen and examined.  He states that he feels 'fine' and denies fever, chills, chest pain, dyspnea, cough, nausea, constipation, diarrhea.     OBJECTIVE     VITAL SIGNS   BP: 109/79 (11/09 1010)  Temp: 36.6 ???C (97.9 ???F) (11/09 1010)  Pulse: 92 (11/09 1010)  Respirations: 18 PER MINUTE (11/09 1010)  SpO2: 100 % (11/09 1010)  O2 Delivery: None (Room Air) (11/09 1010)       PHYSICAL EXAM   General: ???Alert and response to question appropriately. ???VS as above. No acute distress . ??????  Lungs: ???No use of accessory muscles. ???Clear to auscultation bilaterally without wheezes, rales, or rhonchi. Chest tube in place. Decrease BS at the bases more over the L lung  Cardiovascular: RRR, ???No edema in BLE.   Abdomen: ???BS+, soft, ND, ???no guarding or rigidity. ???Nontender to palpation.  Skin: Skin color normal, no obvious evidence of rashes.   Neurologic: ???Alerted and oriented x 3. ???  Psych:??????Alerted and oriented, calm, normal affect    LAB REVIEW     Recent Labs      11/29/16   0603  11/30/16   0546   WBC  15.9*  13.0*   HGB  7.5*  7.0*   HCT  22.6*  21.2*   PLTCT  601*  539*   MCV  88.6  89.5     Recent Labs      11/29/16   0603  11/30/16   0546   NA  129*  131*   K  3.8  3.9   CL  97*  101   CO2  27  25   BUN  12  13   CR  0.88  0.87   GAP  5  5   GFR  >60  >60   ALBUMIN  1.8*  1.7*   TOTBILI  0.3  0.2*   AST  16  14   ALT  9  9       MEDICATIONS   Scheduled Meds:    aspirin EC tablet 81 mg 81 mg Oral QDAY   carvedilol (COREG) tablet 12.5 mg 12.5 mg Oral BID   cilostazol(+) (PLETAL) tablet 50 mg 50 mg Oral BID before meals   dutasteride (AVODART) capsule 0.5 mg 0.5 mg Oral QDAY   enoxaparin (LOVENOX) syringe 40 mg 40 mg Subcutaneous QDAY(21)   levothyroxine (SYNTHROID) tablet 100 mcg 100 mcg Oral QDAY   montelukast (SINGULAIR) tablet 10 mg 10 mg Oral QDAY   pantoprazole (PROTONIX) injection 40 mg 40 mg Intravenous QDAY   piperacillin/tazobactam  (ZOSYN) 4.5 g/100 mL iso-osmotic IVPB 4.5 g Intravenous Q6H*   polyethylene glycol 3350 (MIRALAX) packet 17 g 1 packet Oral BID senna/docusate (SENOKOT-S) tablet  1 tablet 1 tablet Oral BID   vancomycin (VANCOCIN) 1,250 mg in dextrose 5% (D5W) IVPB 1,250 mg Intravenous Q24H*   Continuous Infusions:  PRN and Respiratory Meds:albuterol Q4H PRN, [DISCONTINUED] vancomycin   IVPB Q12H* **AND** vancomycin, pharmacy to manage Per Pharmacy      RADIOLOGY AND OTHER DIAGNOSTIC PROCEDURES REVIEW     iology and O[facility]  Pertinent radiology reviewed.

## 2016-12-01 ENCOUNTER — Inpatient Hospital Stay: Admit: 2016-12-01 | Discharge: 2016-12-01 | Payer: MEDICARE

## 2016-12-01 DIAGNOSIS — R05 Cough: ICD-10-CM

## 2016-12-01 LAB — VANCOMYCIN TROUGH: Lab: 15 ug/mL (ref 10.0–20.0)

## 2016-12-01 LAB — COMPREHENSIVE METABOLIC PANEL
Lab: 134 MMOL/L — ABNORMAL LOW (ref 137–147)
Lab: 3.9 MMOL/L — ABNORMAL HIGH (ref >60)

## 2016-12-01 LAB — CBC AND DIFF
Lab: 15 K/UL — ABNORMAL HIGH (ref >60)
Lab: 2.8 M/UL — ABNORMAL LOW (ref >60)

## 2016-12-01 LAB — GRAM STAIN

## 2016-12-01 MED ORDER — ~~LOC~~ CLOG DESTROYER(#) (ZENPEP 20,000 + SODIUM BICARB CAPSULE)
1 | GASTROSTOMY | 0 refills | Status: DC | PRN
Start: 2016-12-01 — End: 2016-12-12

## 2016-12-01 NOTE — Patient Education
Medication Education    Letta Moynahanhomas Fichter accepted counseling and was receptive.  he needs reinforcement.    The following medications were discussed:  Coreg, avodart, singulair, protonix, miralax, senna, zosyn    Where indicated, the patient was provided with additional medication and/or disease-state information.  All patient questions were answered and patient acknowledged understanding of the medications, side effects and other pertinent medication information.    Follow up should occur daily.    Continue to address: indications    Jolayne HainesAndrea  , RN

## 2016-12-01 NOTE — Consults
CLINICAL NUTRITION                                                        Clinical Nutrition Follow-Up Summary     NAME:Javier Gutierrez             MRN: [id number]             DOB:[birth year]          AGE: 76 y.o.  ADMISSION DATE: 11/20/2016             DAYS ADMITTED: LOS: 11 days    Nutrition Assessment of Patient:  Malnutrition Assessment: Does not meet criteria  Current Oral Intake: Marginally Adequate  Estimated Calorie Needs: 1700-1840 kcal (25-27 kcal/kg present wt 68kg)  Estimated Protein Needs: 82-88g (1.2-1.3g/kg present wt 68kg)  Oral Diet Order: Regular  Oral Supplement: Boost Plus, TID  Current EN Order: Isosource 1.5 1 carton TID to provide 1125 kcal, 51 grams protein, and 573 mL free H2O.      Comments:  RD auto-consulted for EN recommendations. Full follow-up with EN recommendations provided 11/9. EN order varies from recommendations, however remains appropriate. Current order for Isosource 1.5 bolus of 250 mL TID to provide at total of 1125 kcal, 51 grams protein, and 573 mL H2O. Patient also on regular diet with Boost Plus ordered TID. EN order providing >50% estimated kcal/protein needs. RD will follow-up on scheduled date to make adjustments as needed.     Recommendation:  Continue EN as ordered to allow patient to also eat PO.   Continue regular diet with Boost Plus TID.                               Delice BisonShelby , RD, LD  [phone]

## 2016-12-01 NOTE — Progress Notes
Pharmacy Vancomycin Note  Subjective:   Javier Gutierrez is a 76 y.o. male being treated for post-obstructive pneumonia.    Objective:     Current Vancomycin Orders   Medication Dose Route Frequency    vancomycin (VANCOCIN) 1,250 mg in dextrose 5% (D5W) IVPB  1,250 mg Intravenous Q24H*    vancomycin, pharmacy to manage  1 each Service Per Pharmacy     Start Date of  Vancomycin therapy: 11/20/2016  Additional Abx: piperacillin/tazobactam  Cultures: 10/30 Blood:NGTD, 10/30 pleural fluid strep intermedius, 11/9 BAL: GPCs    White Blood Cells   Date/Time Value Ref Range Status   12/01/2016 0552 15.7 (H) 4.5 - 11.0 K/UL Final   11/30/2016 0546 13.0 (H) 4.5 - 11.0 K/UL Final   11/29/2016 0603 15.9 (H) 4.5 - 11.0 K/UL Final     Creatinine   Date/Time Value Ref Range Status   12/01/2016 0552 0.96 0.4 - 1.24 MG/DL Final   11/30/2016 0546 0.87 0.4 - 1.24 MG/DL Final   11/29/2016 0603 0.88 0.4 - 1.24 MG/DL Final     Blood Urea Nitrogen   Date/Time Value Ref Range Status   12/01/2016 0552 14 7 - 25 MG/DL Final     Estimated CrCl: 63 mL/min    Actual Weight:  68 kg (150 lb)  Dosing BW:  68 kg   Drug Levels:  Vancomycin Trough   Date/Time Value Ref Range Status   12/01/2016 0552 15.8 10.0 - 20.0 MCG/ML Final       Assessment:   Target levels for this patient: trough ~15 mcg/mL.  Evaluation of level(s): The trough was drawn appropriately prior to the 4th dose after increasing to 1250 mg daily and is within the desired range for the treatment of pneumonia.  Her renal function remains at baseline (SCr ~0.8-1.0).     Plan:   1. Continue vancomycin 1250 mg IV q24h.  2. Next scheduled level(s): A repeat trough will be drawn in 5-7 days if continued or if the patient experiences a decline in clinical status or renal function.  3. Pharmacy will continue to monitor and adjust therapy as needed.    Bubba Hales, [facility]  12/01/2016

## 2016-12-01 NOTE — Anesthesia Pain Rounding
Anesthesia Follow-Up Evaluation: Post-Procedure Day One    Name: Javier Gutierrez     MRN: [id number]     DOB: 1940-05-31     Age: 76 y.o.     Sex: male   __________________________________________________________________________     Procedure Date: 11/30/2016   Procedure: Procedure(s) with comments:  BRONCHOSCOPY - CASE LENGTH 1 HOUR  BRONCHOSCOPY WITH BRONCHIAL ALVEOLAR LAVAGE - FLEXIBLE    Physical Assessment  Height: 177.8 cm (70)  Weight: 68 kg (150 lb)    Vital Signs (Last Filed in 24 hours)  BP: 149/59 (11/10 0800)  Temp: 36.5 ???C (97.7 ???F) (11/10 0800)  Pulse: 73 (11/10 0800)  Respirations: 18 PER MINUTE (11/10 0800)  SpO2: 95 % (11/10 0800)  O2 Delivery: None (Room Air) (11/10 0800)  SpO2 Pulse: 82 (11/09 1600)    Patient History   Allergies  Allergies   Allergen Reactions   ??? Sulfa (Sulfonamide Antibiotics) UNKNOWN        Medications  Scheduled Meds:  aspirin EC tablet 81 mg 81 mg Oral QDAY   carvedilol (COREG) tablet 12.5 mg 12.5 mg Oral BID   cilostazol(+) (PLETAL) tablet 50 mg 50 mg Oral BID before meals   dutasteride (AVODART) capsule 0.5 mg 0.5 mg Oral QDAY   enoxaparin (LOVENOX) syringe 40 mg 40 mg Subcutaneous QDAY(21)   levothyroxine (SYNTHROID) tablet 100 mcg 100 mcg Oral QDAY   montelukast (SINGULAIR) tablet 10 mg 10 mg Oral QDAY   pantoprazole (PROTONIX) injection 40 mg 40 mg Intravenous QDAY   piperacillin/tazobactam  (ZOSYN) 4.5 g/100 mL iso-osmotic IVPB 4.5 g Intravenous Q6H*   polyethylene glycol 3350 (MIRALAX) packet 17 g 1 packet Oral BID   senna/docusate (SENOKOT-S) tablet 1 tablet 1 tablet Oral BID   tetracaine 0.25% /EPINEPHrine 0.003%(#) 30 mL 30 mL Injection ONCE   vancomycin (VANCOCIN) 1,250 mg in dextrose 5% (D5W) IVPB 1,250 mg Intravenous Q24H*   Continuous Infusions:  ??? lactated ringers infusion 1,000 mL (11/30/16 1352)     PRN and Respiratory Meds:albuterol Q4H PRN, [DISCONTINUED] vancomycin   IVPB Q12H* **AND** vancomycin, pharmacy to manage Per Pharmacy      Diagnostic Tests Hematology: Lab Results   Component Value Date    HGB 8.5 12/01/2016    HCT 25.5 12/01/2016    PLTCT 490 12/01/2016    WBC 15.7 12/01/2016    NEUT 79 12/01/2016    ANC 12.50 12/01/2016    ALC 1.70 12/01/2016    MONA 8 12/01/2016    AMC 1.30 12/01/2016    EOSA 1 12/01/2016    ABC 0.10 12/01/2016    MCV 89.6 12/01/2016    MCH 30.1 12/01/2016    MCHC 33.5 12/01/2016    MPV 7.1 12/01/2016    RDW 16.9 12/01/2016         General Chemistry: Lab Results   Component Value Date    NA 134 12/01/2016    K 3.9 12/01/2016    CL 101 12/01/2016    CO2 28 12/01/2016    GAP 5 12/01/2016    BUN 14 12/01/2016    CR 0.96 12/01/2016    GLU 108 12/01/2016    CA 7.7 12/01/2016    ALBUMIN 1.8 12/01/2016    MG 1.9 11/21/2016    TOTBILI 0.3 12/01/2016    PO4 3.2 11/21/2016      Coagulation:   Lab Results   Component Value Date    PTT 29.3 08/03/2015    INR 1.1 08/03/2015  Follow-Up Assessment  Patient location during evaluation: floor      Anesthetic Complications:   Anesthetic complications: The patient did not experience any anesthestic complications.      Pain:  Score: 0    Management:adequate     Level of Consciousness: awake and alert   Hydration:acceptable     Airway Patency: patent   Respiratory Status: room air     Cardiovascular Status:hemodynamically stable   Regional/Neuroaxial:

## 2016-12-01 NOTE — Progress Notes
Name:  Javier Gutierrez                                               MRN:  [id number]   Admission Date:  11/20/2016  Today's Date: 12/01/16  LOS: 11      ASSESSMENT AND PLAN     76 year old man with history of hypertension, hyperlipidemia, coronary artery disease, aortic stenosis, chronic kidney disease, peripheral artery disease, and lifetime smoker who presents with concern for weakness and cough.  Found to have complicated empyema.  Chest tube was inserted.  Pulmonary treated the empyema with TPA/dornase.  CT surgery decided there is no surgical indication for decortication.  There was concern on the CT for possible obstruction pneumonia and infection disease recommended to have bronchoscopy.  ???  Complicated parapneumonic effusion  - CT Chest 11/20/16: Consolidation of the majority of the left mid and lower lung, most compatible with pneumonia and partially loculated pleural effusion. Underlying neoplastic endobronchial lesion possible.  - LDH 293, white count 1400, pH of pleural fluid 7.29, cytology negative for malignant cells  - pleural fluid culture STREPTOCOCCUS INTERMEDIUS sensitive to PCN  - urine strep and leg Ag are negative, RVP negative  - s/p chest tube placement 11/20/16  - repeat CT 11/24/16 scan showed development of scattered opacities throughout the right lung that is most suggestive of pneumonia (new), Small, partially loculated left-sided hydropneumothorax with a pleural drain in place. ???The pleural fluid has mildly decreased and the pleural gas has developed since the October 30  - because of now new right sided ground glass opacities as well as up trending WBC, added Levaquin 11/24/16 on top of zosyn and vanc  - pulmonary following: Resume intrapleural t-PA/Dornase for 6 more doses, keep CT to -20cm wall suction when not clamped with t-pa or dornase, continue aggressive Pulmonary hygiene  - Nov 5: ID consulted recommended continue current antibiotics. - Nov 5 : CTS consulted. No surgical intervention   - Nov 5: CT showed Slight??? increase??? in??? mild??? patchy??? groundglass??? opacities??? throughout??? the??? right??? lung. Unchanged??? small??? right??? pleural??? effusion.  - DC'd levaquin (complete 5 days course)  - Repeated chest CT by pulmonary on November 7 reviewed.  No new changes  - will continue Chest tube until the drainage is less than 30mL /24hours, output was not documented yesterday  - Bronchoscopy with BAL done on 11/9, cultures are pending    Improving leucocytosis/ Sepsis   - Echo was basically negative for any vegetation. Altho the study was difficult, in the setting of low clinical suspicion, no need for more testing.   - Speech therapy done Nov 5.   - procalcitonin slightly elevated     Anemia and thrombocytosis  - due to chronic inflammation as per iron panel.   - transfused 1 unit pRBCs on 11/9  - Hgb 8.5 today  - cont PPI  ???  CAD  - Continue PTA Aspirin/COreg  - no chest pain  ???  Recent Mechanical fall  - CT head negative  - CT C spine was suspicious for possible lytic lesions to the c spine  - Consulted PT/OT   ???  HLD/HTN  - He is of his simvastatin and fenofibrate due to reported muscle weakness since last Thursday Nov 1st  - Continue PTA coreg and hydralazine  ???  Hypothyroidism  - TSH/Free T4 normal  -  Continue PTA synthroid  ???  Aortic Valve stenosis   - S/P TAVR in 2017  ???  Euvolemic hypoNa  - asymptomatic   -  Urine osmol 227 and Na 70  - serum osmo 268  - TSH WNL. am cortisol 15, WNL  - regular diet     Anorexia, mild protein cal malnutrition?   - multiple etiology   - could be related to antibiotics, current illness, anemia, disliking hospital food  - trial enteral tube feeds today as concern for aspiration    Dysphagia  - concern for aspiration pneumonia and empyema  - reviewed Speech therapy note from initial consultation, however patient continues to cough during feedings  - will trial tube feeds this weekend - will ask Speech to re-evaluate on Monday    PT/OT recommneds inpatient setting    FEN  - lytes stable  - no IVF  - NPO, Bolus TF today    Ppx  - SCDs, holding chemical ppx for procedure    Disp  - continue inpatient care    SUBJECTIVE   Patient seen and examined.  He states that he continues to feel 'fine' and denies fever, chills, chest pain, dyspnea, cough, nausea, constipation, diarrhea.  Daughter is asking about trial of tube feeds since he isn't eating well and he continues to cough when he swallows.     OBJECTIVE     VITAL SIGNS   BP: 149/59 (11/10 0800)  Temp: 36.5 ???C (97.7 ???F) (11/10 0800)  Pulse: 73 (11/10 0800)  Respirations: 18 PER MINUTE (11/10 0800)  SpO2: 95 % (11/10 0800)  O2 Delivery: None (Room Air) (11/10 0800)  SpO2 Pulse: 82 (11/09 1600)       PHYSICAL EXAM   General: ???Alert and response to question appropriately. ???VS as above. No acute distress . ??????  Lungs: ???No use of accessory muscles. ???Clear to auscultation bilaterally without wheezes, rales, or rhonchi. Chest tube in place. Decrease BS at the bases more over the L lung  Cardiovascular: RRR, ???No edema in BLE.   Abdomen: ???BS+, soft, ND, ???no guarding or rigidity. ???Nontender to palpation.  Skin: Skin color normal, no obvious evidence of rashes.   Neurologic: ???Alerted and oriented x 3. ???  Psych:??????Alerted and oriented, calm, normal affect    LAB REVIEW     Recent Labs      11/30/16   0546  12/01/16   0552   WBC  13.0*  15.7*   HGB  7.0*  8.5*   HCT  21.2*  25.5*   PLTCT  539*  490*   MCV  89.5  89.6     Recent Labs      11/30/16   0546  12/01/16   0552   NA  131*  134*   K  3.9  3.9   CL  101  101   CO2  25  28   BUN  13  14   CR  0.87  0.96   GAP  5  5   GFR  >60  >60   ALBUMIN  1.7*  1.8*   TOTBILI  0.2*  0.3   AST  14  15   ALT  9  9       MEDICATIONS   Scheduled Meds:    aspirin EC tablet 81 mg 81 mg Oral QDAY   carvedilol (COREG) tablet 12.5 mg 12.5 mg Oral BID   cilostazol(+) (PLETAL) tablet 50 mg 50 mg Oral BID before  meals dutasteride (AVODART) capsule 0.5 mg 0.5 mg Oral QDAY   enoxaparin (LOVENOX) syringe 40 mg 40 mg Subcutaneous QDAY(21)   levothyroxine (SYNTHROID) tablet 100 mcg 100 mcg Oral QDAY   montelukast (SINGULAIR) tablet 10 mg 10 mg Oral QDAY   pantoprazole (PROTONIX) injection 40 mg 40 mg Intravenous QDAY   piperacillin/tazobactam  (ZOSYN) 4.5 g/100 mL iso-osmotic IVPB 4.5 g Intravenous Q6H*   polyethylene glycol 3350 (MIRALAX) packet 17 g 1 packet Oral BID   senna/docusate (SENOKOT-S) tablet 1 tablet 1 tablet Oral BID   tetracaine 0.25% /EPINEPHrine 0.003%(#) 30 mL 30 mL Injection ONCE   vancomycin (VANCOCIN) 1,250 mg in dextrose 5% (D5W) IVPB 1,250 mg Intravenous Q24H*   Continuous Infusions:  ??? lactated ringers infusion 1,000 mL (11/30/16 1352)     PRN and Respiratory Meds:albuterol Q4H PRN, [DISCONTINUED] vancomycin   IVPB Q12H* **AND** vancomycin, pharmacy to manage Per Pharmacy      RADIOLOGY AND OTHER DIAGNOSTIC PROCEDURES REVIEW     iology and Othe  Pertinent radiology reviewed.

## 2016-12-01 NOTE — Patient Education
Medication Education    Letta Moynahanhomas Cotta accepted counseling and was interactive.  he verbalized understanding.    The following medications were discussed:  Bronchoscopy  Blood Transfusion  Labs    Where indicated, the patient was provided with additional medication and/or disease-state information.  All patient questions were answered and patient acknowledged understanding of the medications, side effects and other pertinent medication information.    Follow up should occur as needed.    Continue to address: indications    Tomi LikensAshleigh Lilyahna Sirmon, RN

## 2016-12-02 DIAGNOSIS — R05 Cough: Secondary | ICD-10-CM

## 2016-12-02 LAB — C DIFFICILE BY PCR: Lab: NEGATIVE

## 2016-12-02 LAB — COMPREHENSIVE METABOLIC PANEL: Lab: 129 MMOL/L — ABNORMAL LOW (ref 137–147)

## 2016-12-02 LAB — CULTURE-RESP,LOWER W/SENSITIVITY: Lab: LOW

## 2016-12-02 LAB — CMV QUANT PCR-FLUID

## 2016-12-02 LAB — CBC AND DIFF: Lab: 15 K/UL — ABNORMAL HIGH (ref 4.5–11.0)

## 2016-12-02 NOTE — Progress Notes
Name:  Javier Gutierrez                                               MRN:  [id number]   Admission Date:  11/20/2016  Today's Date: 12/02/16  LOS: 12      ASSESSMENT AND PLAN     76 year old man with history of hypertension, hyperlipidemia, coronary artery disease, aortic stenosis, chronic kidney disease, peripheral artery disease, and lifetime smoker who presents with concern for weakness and cough.  Found to have complicated empyema.  Chest tube was inserted.  Pulmonary treated the empyema with TPA/dornase.  CT surgery decided there is no surgical indication for decortication.  There was concern on the CT for possible obstruction pneumonia and infection disease recommended to have bronchoscopy.  ???  Complicated parapneumonic effusion  - CT Chest 11/20/16: Consolidation of the majority of the left mid and lower lung, most compatible with pneumonia and partially loculated pleural effusion. Underlying neoplastic endobronchial lesion possible.  - LDH 293, white count 1400, pH of pleural fluid 7.29, cytology negative for malignant cells  - pleural fluid culture STREPTOCOCCUS INTERMEDIUS sensitive to PCN  - urine strep and leg Ag are negative, RVP negative  - s/p chest tube placement 11/20/16  - repeat CT 11/24/16 scan showed development of scattered opacities throughout the right lung that is most suggestive of pneumonia (new), Small, partially loculated left-sided hydropneumothorax with a pleural drain in place. ???The pleural fluid has mildly decreased and the pleural gas has developed since the October 30  - because of now new right sided ground glass opacities as well as up trending WBC, added Levaquin 11/24/16 on top of zosyn and vanc  - pulmonary following: Resume intrapleural t-PA/Dornase for 6 more doses, keep CT to -20cm wall suction when not clamped with t-pa or dornase, continue aggressive Pulmonary hygiene  - Nov 5: ID consulted recommended continue current antibiotics. - Nov 5 : CTS consulted. No surgical intervention   - Nov 5: CT showed Slight??? increase??? in??? mild??? patchy??? groundglass??? opacities??? throughout??? the??? right??? lung. Unchanged??? small??? right??? pleural??? effusion.  - DC'd levaquin (complete 5 days course)  - Repeated chest CT by pulmonary on November 7 reviewed.  No new changes  - Bronchoscopy with BAL done on 11/9, cultures are pending  - Pulmonology to consider pulling chest tube today    Improving leucocytosis/ Sepsis   - Echo was basically negative for any vegetation. Altho the study was difficult, in the setting of low clinical suspicion, no need for more testing.   - Speech therapy done Nov 5.   - procalcitonin slightly elevated     Anemia and thrombocytosis  - due to chronic inflammation as per iron panel.   - transfused 1 unit pRBCs on 11/9  - Hgb 8.5 today  - cont PPI  ???  CAD  - Continue PTA Aspirin/COreg  - no chest pain  ???  Recent Mechanical fall  - CT head negative  - CT C spine was suspicious for possible lytic lesions to the c spine  - Consulted PT/OT   ???  HLD/HTN  - He is of his simvastatin and fenofibrate due to reported muscle weakness since last Thursday Nov 1st  - Continue PTA coreg and hydralazine  ???  Hypothyroidism  - TSH/Free T4 normal  - Continue PTA synthroid  ???  Aortic Valve  stenosis   - S/P TAVR in 2017  ???  Euvolemic hypoNa  - asymptomatic   -  Urine osmol 227 and Na 70  - serum osmo 268  - TSH WNL. am cortisol 15, WNL  - regular diet     Anorexia, mild protein cal malnutrition?   - multiple etiology   - could be related to antibiotics, current illness, anemia, disliking hospital food  - trial enteral tube feeds today as concern for aspiration    Dysphagia  - concern for aspiration pneumonia and empyema  - reviewed Speech therapy note from initial consultation, however patient continues to cough during feedings  - trialing tube feeds this weekend  - will ask Speech to re-evaluate on Monday    PT/OT recommneds inpatient setting    FEN - lytes stable  - no IVF  - NPO, Bolus TF today    Ppx  - SCDs, holding chemical ppx for procedure    Disp  - continue inpatient care    SUBJECTIVE   Patient seen and examined.  He has no concerns today, there were no complications with Corpak placement yesterday and he states that he is tolerating it well.  Nurse notes diarrhea.  Son states that his father seems much more alert today than when he last saw him on Friday.       OBJECTIVE     VITAL SIGNS   BP: 150/61 (11/11 0000)  Temp: 36.9 ???C (98.5 ???F) (11/11 0000)  Pulse: 92 (11/11 0000)  Respirations: 16 PER MINUTE (11/11 0000)  SpO2: 92 % (11/11 0000)  O2 Delivery: Nasal Cannula (11/11 0000)       PHYSICAL EXAM   General: ???Alert and response to question appropriately. ???VS as above. No acute distress . ??????  Lungs: ???No use of accessory muscles. ???Clear to auscultation bilaterally without wheezes, rales, or rhonchi. Chest tube in place. Decrease BS at the bases more over the L lung  Cardiovascular: RRR, ???No edema in BLE.   Abdomen: ???BS+, soft, ND, ???no guarding or rigidity. ???Nontender to palpation.  Skin: Skin color normal, no obvious evidence of rashes.   Neurologic: ???Alerted and oriented x 3. ???  Psych:??????Alerted and oriented, calm, normal affect    LAB REVIEW     Recent Labs      11/30/16   0546  12/01/16   0552   WBC  13.0*  15.7*   HGB  7.0*  8.5*   HCT  21.2*  25.5*   PLTCT  539*  490*   MCV  89.5  89.6     Recent Labs      11/30/16   0546  12/01/16   0552   NA  131*  134*   K  3.9  3.9   CL  101  101   CO2  25  28   BUN  13  14   CR  0.87  0.96   GAP  5  5   GFR  >60  >60   ALBUMIN  1.7*  1.8*   TOTBILI  0.2*  0.3   AST  14  15   ALT  9  9       MEDICATIONS   Scheduled Meds:    aspirin EC tablet 81 mg 81 mg Oral QDAY   carvedilol (COREG) tablet 12.5 mg 12.5 mg Oral BID   cilostazol(+) (PLETAL) tablet 50 mg 50 mg Oral BID before meals   dutasteride (AVODART) capsule 0.5 mg 0.5 mg  Oral QDAY   enoxaparin (LOVENOX) syringe 40 mg 40 mg Subcutaneous QDAY(21) levothyroxine (SYNTHROID) tablet 100 mcg 100 mcg Oral QDAY   montelukast (SINGULAIR) tablet 10 mg 10 mg Oral QDAY   pantoprazole (PROTONIX) injection 40 mg 40 mg Intravenous QDAY   piperacillin/tazobactam  (ZOSYN) 4.5 g/100 mL iso-osmotic IVPB 4.5 g Intravenous Q6H*   polyethylene glycol 3350 (MIRALAX) packet 17 g 1 packet Oral BID   senna/docusate (SENOKOT-S) tablet 1 tablet 1 tablet Oral BID   vancomycin (VANCOCIN) 1,250 mg in dextrose 5% (D5W) IVPB 1,250 mg Intravenous Q24H*   Continuous Infusions:  ??? lactated ringers infusion 1,000 mL (11/30/16 1352)     PRN and Respiratory Meds:albuterol Q4H PRN, pancrelipase 20,000 Units/ sodium bicarbonate 650 mg(#) PRN (On Call from Rx), [DISCONTINUED] vancomycin   IVPB Q12H* **AND** vancomycin, pharmacy to manage Per Pharmacy      RADIOLOGY AND OTHER DIAGNOSTIC PROCEDURES REVIEW     iology and Othe  Pertinent radiology reviewed.

## 2016-12-02 NOTE — Patient Education
Medication Education    Javier Gutierrez accepted counseling and was receptive.  he needs reinforcement.    The following medications were discussed:  Zosyn, aspirin, coreg, avodart, singulair, protonix,     Where indicated, the patient was provided with additional medication and/or disease-state information.  All patient questions were answered and patient acknowledged understanding of the medications, side effects and other pertinent medication information.    Follow up should occur daily.    Continue to address: indications    Jolayne HainesAndrea  , RN

## 2016-12-02 NOTE — Progress Notes
PHYSICAL THERAPY  PROGRESS NOTE       MOBILITY:  Mobility  Progressive Mobility Level: Walk in hallway  Distance Walked (feet): 150 ft  Level of Assistance: Assist X1  Assistive Device: Walker  Time Tolerated: 11-30 minutes  Activity Limited By: Fatigue;Lines / Medical Devices    SUBJECTIVE:  Subjective  Significant hospital events: PMH significant for HTN, HLD, CAD, aortic stenosis s/p TAVR, CKD, PAD, lifetime smoker, chronic diastolic heart failure. Admitted 11/20/16 with weakness and cough. Found atelectasis and L PE with chest tube placed.   Mental / Cognitive Status: Alert;Cooperative;Follows Commands  Persons Present: Son  Pain: Patient has no complaint of pain  Pain Interventions: Patient agrees to participate in therapy  Comments: Patient sitting up in bedside chair upon PT arrival. Patient on RA this date. Chest tube no longer on wall suction.      Ambulation Assist: Independent Mobility in Community with Device  Patient Owned Equipment: Nurse, adult  Home Situation: Lives Alone  Type of Home: Apartment (independent living)  Entry Stairs: No Stairs  In-Home Stairs: No Stairs    BED MOBILITY/TRANSFERS:  Bed Mobility/Transfers  Bed Mobility: Sit to Supine: Moderate Assist;Assist with B LE;Assist with Trunk  Comments: Also required assist to boost in bed.     Transfer Type: Sit to Stand  Transfer: Assistance Level: From;Bed Side Chair;Minimal Assist  Transfer: Assistive Device: Nurse, adult  Transfers: Type Of Assistance: For Balance;For Strength Deficit;Verbal Cues;For Safety Considerations    Other Transfer Type: Stand to Sit  Other Transfer: Assistance Level: To;Bed;Minimal Assist  Other Transfer: Assistive Device: Nurse, adult  Other Transfer: Type Of Assistance: For Strength Deficit;For Balance;Verbal Cues;For Safety Considerations    End Of Activity Status: In Bed;Instructed Patient to Request Assist with Mobility;Instructed Patient to Use Call Light Comments: Patient returned supine in bed with bed alarm activated and call light within reach.     GAIT:  Gait  Gait Distance: 150 feet  Gait: Assistance Level:  (contact guard assist)  Gait: Assistive Device: Roller Walker  Gait: Descriptors: Pace: Slow;Forward trunk flexion  Activity Limited By: Complaint of Fatigue;Weakness  Comments: Patient declined second bout of ambulation this date due to reports of fatigue. Requested to return to bed.     EDUCATION:  Education  Comments: Encouraged patient to ambulate 1-2 more times this afternoon with nursing staff.     ASSESSMENT/PROGRESS:  Assessment/Progress  Impaired Mobility Due To: Decreased Strength;Impaired Balance;Decreased Activity Tolerance;Deconditioning  Assessment/Progress: Should Improve w/ Continued PT  Comments: Patient continues to demonstrate improvements in mobility. He was very motivated to participate in therapy this date and reports he has been ambulating with nursing staff outside of therapy session. Anticipate further improvements with continued therapy.     AM-PAC 6 Clicks Basic Mobility Inpatient  Turning from your back to your side while in a flat bed without using bed rails: A Little  Moving from lying on your back to sitting on the side of a flatbed without using bedrails : A Lot  Moving to and from a bed to a chair (including a wheelchair): A Little  Standing up from a chair using your arms (e.g. wheelchair, or bedside chair): A Little  To walk in hospital room: A Little  Climbing 3-5 steps with a railing: A Lot  Raw Score: 16  Standardized (T-scale) Score: 38.32  Basic Mobility CMS 0-100%: 47.12  CMS G Code Modifier for Basic Mobility: CK    GOALS:  Goals  Goal Formulation: With Patient/Family  Time  For Goal Achievement: 5 days  Pt Will Go Supine To/From Sit: w/ Stand By Assist, Ongoing  Pt Will Transfer Bed/Chair: w/ Stand By Assist, Ongoing  Pt Will Transfer Sit to Stand: w/ Stand By Assist, Ongoing Pt Will Ambulate: Greater than 200 Feet, w/ Dan Humphreys, w/ Stand By Assist, Ongoing    PLAN:  Plan   Treatment Interventions: Mobility Training;Strengthening;Balance Activities;Endurance Training  Plan Frequency: 5 Days per Week  PT Plan for Next Visit: Progress ambulation as tolerated, transfer to chair to promote increased time out of bed, endurance training.     RECOMMENDATIONS:  PT Discharge Recommendations  PT Discharge Recommendations: Inpatient Setting;Recommend Physical Medicine and Rehabilitation Consult to address most appropriate level of rehabilitation placement (as patient with generalized deconditioning and lives alone)  Equipment Recommendations: Nurse, adult    Patient requires the use of a walker with wheels to complete ADL???s in the home including meal preparation, ambulation the bathroom for toileting, bathing and grooming, and safe home mobility.  Patient is unable to complete these ADL???s with a cane or crutch and can safely use the walker.     Therapist: Charlsie Merles, PT, DPT [phone]  Date: 12/02/2016

## 2016-12-03 LAB — HERPES SIMPLEX PCR - NON-BLOOD

## 2016-12-03 LAB — COMPREHENSIVE METABOLIC PANEL
Lab: 130 MMOL/L — ABNORMAL LOW (ref 137–147)
Lab: 3.6 MMOL/L — ABNORMAL LOW (ref 3.5–5.1)

## 2016-12-03 LAB — CBC AND DIFF: Lab: 13 K/UL — ABNORMAL HIGH (ref 4.5–11.0)

## 2016-12-03 NOTE — Patient Education
Medication Education    Javier Gutierrez accepted counseling and was interactive.  he needs reinforcement.    The following medications were discussed:  NPO  Aspirin  Coreg  pletal  avodart  singulair  Zosyn  Senna  Ambulating in the halls  IS          Where indicated, the patient was provided with additional medication and/or disease-state information.  All patient questions were answered and patient acknowledged understanding of the medications, side effects and other pertinent medication information.    Follow up should occur daily.    Continue to address: indications    Tomi LikensAshleigh Kiana Hollar, RN

## 2016-12-03 NOTE — Progress Notes
Name:  Javier Gutierrez                                               MRN:  [id number]   Admission Date:  11/20/2016  Today's Date: 12/03/16  LOS: 13      ASSESSMENT AND PLAN     76 year old man with history of hypertension, hyperlipidemia, coronary artery disease, aortic stenosis, chronic kidney disease, peripheral artery disease, and lifetime smoker who presents with concern for weakness and cough.  Found to have complicated empyema.  Chest tube was inserted.  Pulmonary treated the empyema with TPA/dornase.  CT surgery decided there is no surgical indication for decortication.  There was concern on the CT for possible obstruction pneumonia and infection disease recommended to have bronchoscopy.  ???  Complicated parapneumonic effusion  - CT Chest 11/20/16: Consolidation of the majority of the left mid and lower lung, most compatible with pneumonia and partially loculated pleural effusion. Underlying neoplastic endobronchial lesion possible.  - LDH 293, white count 1400, pH of pleural fluid 7.29, cytology negative for malignant cells  - pleural fluid culture STREPTOCOCCUS INTERMEDIUS sensitive to PCN  - urine strep and leg Ag are negative, RVP negative  - s/p chest tube placement 11/20/16  - repeat CT 11/24/16 scan showed development of scattered opacities throughout the right lung that is most suggestive of pneumonia (new), Small, partially loculated left-sided hydropneumothorax with a pleural drain in place. ???The pleural fluid has mildly decreased and the pleural gas has developed since the October 30  - because of now new right sided ground glass opacities as well as up trending WBC, added Levaquin 11/24/16 on top of zosyn and vanc  - pulmonary following: Resume intrapleural t-PA/Dornase for 6 more doses, keep CT to -20cm wall suction when not clamped with t-pa or dornase, continue aggressive Pulmonary hygiene  - Nov 5: ID consulted recommended continue current antibiotics. - Nov 5 : CTS consulted. No surgical intervention   - Nov 5: CT showed Slight??? increase??? in??? mild??? patchy??? groundglass??? opacities??? throughout??? the??? right??? lung. Unchanged??? small??? right??? pleural??? effusion.  - DC'd levaquin (complete 5 days course)  - Repeated chest CT by pulmonary on November 7 reviewed.  No new changes  - Bronchoscopy with BAL done on 11/9, cultures are negative  - Pulmonology pulled chest tube on 11/11    Improving leucocytosis/ Sepsis   - Echo was basically negative for any vegetation. Altho the study was difficult, in the setting of low clinical suspicion, no need for more testing.   - procalcitonin slightly elevated     Anemia and thrombocytosis  - due to chronic inflammation as per iron panel.   - transfused 1 unit pRBCs on 11/9  - Hgb 8.9 today  - cont PPI  ???  CAD  - Continue PTA Aspirin/COreg  - no chest pain  ???  Recent Mechanical fall  - CT head negative  - CT C spine was suspicious for possible lytic lesions to the c spine  - Consulted PT/OT   ???  HLD/HTN  - He is of his simvastatin and fenofibrate due to reported muscle weakness since last Thursday Nov 1st  - Continue PTA coreg and hydralazine  ???  Hypothyroidism  - TSH/Free T4 normal  - Continue PTA synthroid  ???  Aortic Valve stenosis   - S/P TAVR in 2017  ???  Euvolemic hypoNa  - asymptomatic   -  Urine osmol 227 and Na 70  - serum osmo 268  - TSH WNL. am cortisol 15, WNL  - regular diet     Anorexia, mild protein cal malnutrition?   - multiple etiology   - could be related to antibiotics, current illness, anemia, disliking hospital food  - patient pulled corpak    Dysphagia  - concern for aspiration pneumonia and empyema  - reviewed Speech therapy note from initial consultation, however patient continues to cough during feedings  - patient pulled out corpak last night and refused replacement  - consulted Speech to re-evaluate, recommend NPO and Videoswallow study, may need PEG    PT/OT recommneds inpatient setting    FEN  - lytes stable - no IVF    Ppx  - SCDs, will hold chemical ppx in case of PEG placement    Disp  - continue inpatient care    SUBJECTIVE   Patient seen and examined.  He has no concerns today, he pulled out the corpak last night and refused to have it replaced.  No other concerns.  No fever or chills.  No significant pain.  No dyspnea.     OBJECTIVE     VITAL SIGNS   BP: 161/85 (11/12 0704)  Temp: 36.8 ???C (98.3 ???F) (11/12 0454)  Pulse: 75 (11/12 0704)  Respirations: 20 PER MINUTE (11/12 0704)  SpO2: 97 % (11/12 0704)  O2 Delivery: None (Room Air) (11/12 0981)       PHYSICAL EXAM   General: ???Alert and response to question appropriately. ???VS as above. No acute distress . ??????  Lungs: ???No use of accessory muscles. ???Clear to auscultation bilaterally without wheezes, rales, or rhonchi.  Decrease BS at the bases more over the L lung  Cardiovascular: RRR, ???No edema in BLE.   Abdomen: ???BS+, soft, ND, ???no guarding or rigidity. ???Nontender to palpation.  Skin: Skin color normal, no obvious evidence of rashes.   Neurologic: ???Alerted and oriented x 3. ???  Psych:??????Alerted and oriented, calm, normal affect    LAB REVIEW     Recent Labs      12/02/16   0608  12/03/16   0600   WBC  15.3*  13.0*   HGB  8.6*  8.9*   HCT  25.7*  25.9*   PLTCT  542*  515*   MCV  91.2  91.2     Recent Labs      12/02/16   0608  12/03/16   0600   NA  129*  130*   K  4.1  3.6   CL  100  100   CO2  23  25   BUN  14  12   CR  0.90  0.94   GAP  6  5   GFR  >60  >60   ALBUMIN  1.9*  2.0*   TOTBILI  0.3  0.4   AST  13  11   ALT  4*  7       MEDICATIONS   Scheduled Meds:    aspirin EC tablet 81 mg 81 mg Oral QDAY   carvedilol (COREG) tablet 12.5 mg 12.5 mg Oral BID   cilostazol(+) (PLETAL) tablet 50 mg 50 mg Oral BID before meals   dutasteride (AVODART) capsule 0.5 mg 0.5 mg Oral QDAY   enoxaparin (LOVENOX) syringe 40 mg 40 mg Subcutaneous QDAY(21)   levothyroxine (SYNTHROID) tablet 100 mcg 100 mcg Oral QDAY  montelukast (SINGULAIR) tablet 10 mg 10 mg Oral QDAY pantoprazole (PROTONIX) injection 40 mg 40 mg Intravenous QDAY   piperacillin/tazobactam  (ZOSYN) 4.5 g/100 mL iso-osmotic IVPB 4.5 g Intravenous Q6H*   polyethylene glycol 3350 (MIRALAX) packet 17 g 1 packet Oral BID   senna/docusate (SENOKOT-S) tablet 1 tablet 1 tablet Oral BID   vancomycin (VANCOCIN) 1,250 mg in dextrose 5% (D5W) IVPB 1,250 mg Intravenous Q24H*   Continuous Infusions:    PRN and Respiratory Meds:albuterol Q4H PRN, pancrelipase 20,000 Units/ sodium bicarbonate 650 mg(#) PRN (On Call from Rx), [DISCONTINUED] vancomycin   IVPB Q12H* **AND** vancomycin, pharmacy to manage Per Pharmacy      RADIOLOGY AND OTHER DIAGNOSTIC PROCEDURES REVIEW     iology and Othe  Pertinent radiology reviewed.

## 2016-12-03 NOTE — Progress Notes
PHYSICAL THERAPY  PROGRESS NOTE       MOBILITY:  Mobility  Progressive Mobility Level: Walk in hallway  Distance Walked (feet): 300 ft  Level of Assistance: Assist X1  Assistive Device: None    SUBJECTIVE:  Subjective  Significant hospital events: PMH significant for HTN, HLD, CAD, aortic stenosis s/p TAVR, CKD, PAD, lifetime smoker, chronic diastolic heart failure. Admitted 11/20/16 with weakness and cough. Found atelectasis and L PE with chest tube placed.   Mental / Cognitive Status: Alert;Cooperative;Follows Commands  Persons Present: Brother  Pain: Patient has no complaint of pain  Pain Interventions: Patient agrees to participate in therapy  Ambulation Assist: Independent Mobility in [employer] with Device  Patient Owned Equipment: Nurse, adult  Home Situation: Lives Alone  Type of Home: Apartment (independent living)  Entry Stairs: No Stairs  In-Home Stairs: No Stairs  Comments: patient active with his coffee group       BED MOBILITY/TRANSFERS:  Bed Mobility/Transfers  Bed Mobility: Supine to Sit: Minimal Assist;Head of Bed Elevated;Use of Rail;Assist with Trunk;Requires Extra Time (anticipate more moderate assist with HOB flat)  Transfer Type: Sit to Stand  Transfer: Assistance Level: From;Bed;Minimal Assist (contact assist)  Transfer: Assistive Device: Nurse, adult  Transfers: Type Of Assistance: For Balance;For Strength Deficit;Verbal Cues;For Safety Considerations  Other Transfer Type: Stand Pivot  Other Transfer: Assistance Level: To;Bed Side Chair;Minimal Assist  Other Transfer: Assistive Device: Roller Walker  Other Transfer: Type Of Assistance: For Strength Deficit;For Balance;Verbal Cues;For Safety Considerations  End Of Activity Status: Up in Chair;Nursing Notified;Instructed Patient to Request Assist with Mobility;Instructed Patient to Use Call Light (TABS alarm in place, brother at bedside)     GAIT:  Gait  Gait Distance: 300 feet  Gait: Assistance Level: Minimal Assist Gait: Assistive Device: Insurance underwriter: Descriptors: Pace: Slow;Forward trunk flexion  Comments: patient pushing walker too far forward with difficulty pulling back towards base of support. transitioned to ambulation without device; posture more upright, but does require hand held assist for balance.   Activity Limited By: Complaint of Fatigue;Weakness       ASSESSMENT/PROGRESS:  Assessment/Progress  Impaired Mobility Due To: Decreased Strength;Impaired Balance;Decreased Activity Tolerance;Deconditioning  Assessment/Progress: Should Improve w/ Continued PT  Comments: continues to improve. patient frequently says no to care, as patient very onery. however, always willing to participate with this therapist and easily redirected. patient was very active and independent prior to admission. anticipate in a 3 hour rehab setting, it will help restore function and prevent bouts sleeping throughout the day.       AM-PAC 6 Clicks Basic Mobility Inpatient  Turning from your back to your side while in a flat bed without using bed rails: A Little  Moving from lying on your back to sitting on the side of a flatbed without using bedrails : A Little  Moving to and from a bed to a chair (including a wheelchair): A Little  Standing up from a chair using your arms (e.g. wheelchair, or bedside chair): A Little  To walk in hospital room: A Little  Climbing 3-5 steps with a railing: A Lot  Raw Score: 17  Standardized (T-scale) Score: 39.67  Basic Mobility CMS 0-100%: 43.83  CMS G Code Modifier for Basic Mobility: CK    GOALS:  Goals  Goal Formulation: With Patient/Family  Time For Goal Achievement: 5 days  Pt Will Go Supine To/From Sit: w/ Stand By Assist, Ongoing  Pt Will Transfer Bed/Chair: w/ Stand By Assist, Ongoing  Pt Will Transfer Sit to Stand: w/ Stand By Assist, Ongoing  Pt Will Ambulate: Greater than 200 Feet, w/ Dan Humphreys, w/ Stand By Assist, Ongoing    PLAN:  Plan Treatment Interventions: Mobility Training;Strengthening;Balance Activities;Endurance Training  Plan Frequency: 5 Days per Week  PT Plan for Next Visit: Progress ambulation as tolerated, transfer to chair to promote increased time out of bed.     RECOMMENDATIONS:  PT Discharge Recommendations  PT Discharge Recommendations: Inpatient Setting;Recommend Physical Medicine and Rehabilitation Consult to address most appropriate level of rehabilitation placement (as patient with generalized deconditioning and lives alone)  Equipment Recommendations: Roller Walker    Therapist: Joice Lofts Shronda Boeh  Date: 12/03/2016

## 2016-12-03 NOTE — Case Management (ED)
Case Management Progress Note    NAME:Javier Gutierrez                          MRN: [id number]              DOB:[birth year]          AGE: 76 y.o.  ADMISSION DATE: 11/20/2016             DAYS ADMITTED: LOS: 13 days      Todays Date: 12/03/2016    Plan  DCP Ongoing: SW [facility] pending medical stability and bed availability.     Interventions  ? Support   Support: Pt/Family Updates re:POC or DC Plan    SW reviewed EMR and discussed POC with team, anticipated DC Wednesday.   SLP re consulted after pt pulled out corpak over the weekend, Plan for video swallow-pt currently NPO.   ID following-pt still on IV Vanc and Zosyn-finalized recs needs prior to DC.   Pulm following-pulled L chest Tube Yesterday.  ? Info or Referral      ? Discharge Planning   Discharge Planning: Inpatient Rehabilitation    SW discussed EDD of Wednesday vs. Thursday with [facility] admissions who continues to follow.  ? Medication Needs      ? Financial      ? Legal      ? Other        Disposition  ? Expected Discharge Date    Expected Discharge Date: 12/06/16  ? Transportation   Does the patient need discharge transport arranged?: No  Transportation Name, Phone and Availability #1: Wynona CanesChristine Labrum--daughter--[phone]  Does the patient use Medicaid Transportation?: No  ? Next Level of Care (Acute Psych discharges only)      ? Discharge Disposition                                          Durable Medical Equipment     No service has been selected for the patient.      [facility]     No service has been selected for the patient.      [facility]     No service has been selected for the patient.      [facility]     No service has been selected for the patient.          Lianne Cure-Katie , LMSW [phone]

## 2016-12-04 ENCOUNTER — Encounter: Admit: 2016-12-04 | Discharge: 2016-12-04 | Payer: MEDICARE

## 2016-12-04 ENCOUNTER — Ambulatory Visit: Admit: 2016-12-04 | Discharge: 2016-12-04 | Payer: MEDICARE

## 2016-12-04 DIAGNOSIS — I2510 Atherosclerotic heart disease of native coronary artery without angina pectoris: ICD-10-CM

## 2016-12-04 DIAGNOSIS — I7090 Unspecified atherosclerosis: ICD-10-CM

## 2016-12-04 DIAGNOSIS — J45909 Unspecified asthma, uncomplicated: ICD-10-CM

## 2016-12-04 DIAGNOSIS — R05 Cough: Secondary | ICD-10-CM

## 2016-12-04 DIAGNOSIS — I6529 Occlusion and stenosis of unspecified carotid artery: ICD-10-CM

## 2016-12-04 DIAGNOSIS — I429 Cardiomyopathy, unspecified: ICD-10-CM

## 2016-12-04 DIAGNOSIS — R54 Age-related physical debility: ICD-10-CM

## 2016-12-04 DIAGNOSIS — I5023 Acute on chronic systolic (congestive) heart failure: ICD-10-CM

## 2016-12-04 DIAGNOSIS — K635 Polyp of colon: ICD-10-CM

## 2016-12-04 DIAGNOSIS — M1990 Unspecified osteoarthritis, unspecified site: ICD-10-CM

## 2016-12-04 DIAGNOSIS — Z720 Tobacco use: ICD-10-CM

## 2016-12-04 DIAGNOSIS — Z8679 Personal history of other diseases of the circulatory system: ICD-10-CM

## 2016-12-04 DIAGNOSIS — E039 Hypothyroidism, unspecified: ICD-10-CM

## 2016-12-04 DIAGNOSIS — I350 Nonrheumatic aortic (valve) stenosis: ICD-10-CM

## 2016-12-04 DIAGNOSIS — I10 Essential (primary) hypertension: ICD-10-CM

## 2016-12-04 DIAGNOSIS — E785 Hyperlipidemia, unspecified: ICD-10-CM

## 2016-12-04 LAB — COMPREHENSIVE METABOLIC PANEL
Lab: 132 MMOL/L — ABNORMAL LOW (ref >60)
Lab: 3.9 MMOL/L — ABNORMAL LOW (ref >60)

## 2016-12-04 LAB — CELL COUNT W/DIFF-FLUIDS
Lab: 2 %
Lab: 3 %
Lab: 370 /uL
Lab: 93 %

## 2016-12-04 LAB — CBC AND DIFF: Lab: 10 K/UL — ABNORMAL LOW (ref >60)

## 2016-12-04 LAB — POC GLUCOSE: Lab: 87 mg/dL (ref 70–100)

## 2016-12-04 MED ORDER — BARIUM SULFATE 40 % (W/V) 29% (W/W) PO SUSP
10 mL | Freq: Once | ORAL | 0 refills | Status: CP
Start: 2016-12-04 — End: ?
  Administered 2016-12-04: 20:00:00 10 mL via ORAL

## 2016-12-04 MED ORDER — BARIUM SULFATE 40 % (W/V) PO POWD
10 mL | Freq: Once | ORAL | 0 refills | Status: CP
Start: 2016-12-04 — End: ?
  Administered 2016-12-04: 20:00:00 10 mL via ORAL

## 2016-12-04 MED ORDER — BARIUM SULFATE 40 % (W/V), 30% (W/W) PO PSTE
10 mL | Freq: Once | ORAL | 0 refills | Status: CP
Start: 2016-12-04 — End: ?
  Administered 2016-12-04: 20:00:00 10 mL via ORAL

## 2016-12-04 MED ORDER — VANCOMYCIN 1,250 MG IVPB
1250 mg | INTRAVENOUS | 0 refills | Status: DC
Start: 2016-12-04 — End: 2016-12-05
  Administered 2016-12-05 (×2): 1250 mg via INTRAVENOUS

## 2016-12-04 MED ORDER — BARIUM SULFATE 40 % (W/V) PO SUSP
10 mL | Freq: Once | ORAL | 0 refills | Status: CP
Start: 2016-12-04 — End: ?
  Administered 2016-12-04: 20:00:00 10 mL via ORAL

## 2016-12-04 NOTE — Progress Notes
Name:  Javier Gutierrez                                               MRN:  [id number]   Admission Date:  11/20/2016  Today's Date: 12/04/16  LOS: 14      ASSESSMENT AND PLAN     76 year old man with history of hypertension, hyperlipidemia, coronary artery disease, aortic stenosis, chronic kidney disease, peripheral artery disease, and lifetime smoker who presents with concern for weakness and cough.  Found to have complicated empyema.  Chest tube was inserted.  Pulmonary treated the empyema with TPA/dornase.  CT surgery decided there is no surgical indication for decortication.  There was concern on the CT for possible obstruction pneumonia and infection disease recommended to have bronchoscopy.  ???  Complicated parapneumonic effusion  - CT Chest 11/20/16: Consolidation of the majority of the left mid and lower lung, most compatible with pneumonia and partially loculated pleural effusion. Underlying neoplastic endobronchial lesion possible.  - LDH 293, white count 1400, pH of pleural fluid 7.29, cytology negative for malignant cells  - pleural fluid culture STREPTOCOCCUS INTERMEDIUS sensitive to PCN  - urine strep and leg Ag are negative, RVP negative  - s/p chest tube placement 11/20/16  - repeat CT 11/24/16 scan showed development of scattered opacities throughout the right lung that is most suggestive of pneumonia (new), Small, partially loculated left-sided hydropneumothorax with a pleural drain in place. ???The pleural fluid has mildly decreased and the pleural gas has developed since the October 30  - because of now new right sided ground glass opacities as well as up trending WBC, added Levaquin 11/24/16 on top of zosyn and vanc  - pulmonary following: Resume intrapleural t-PA/Dornase for 6 more doses, keep CT to -20cm wall suction when not clamped with t-pa or dornase, continue aggressive Pulmonary hygiene  - Nov 5: ID consulted recommended continue current antibiotics. - Nov 5 : CTS consulted. No surgical intervention   - Nov 5: CT showed Slight??? increase??? in??? mild??? patchy??? groundglass??? opacities??? throughout??? the??? right??? lung. Unchanged??? small??? right??? pleural??? effusion.  - DC'd levaquin (complete 5 days course)  - Repeated chest CT by pulmonary on November 7 reviewed.  No new changes  - Bronchoscopy with BAL done on 11/9, cultures are negative  - Pulmonology pulled chest tube on 11/11    Improving leucocytosis/ Sepsis   - Echo was basically negative for any vegetation. Altho the study was difficult, in the setting of low clinical suspicion, no need for more testing.   - procalcitonin slightly elevated     Anemia and thrombocytosis  - due to chronic inflammation as per iron panel.   - transfused 1 unit pRBCs on 11/9  - Hgb 9.0 today  - cont PPI  ???  CAD  - Continue PTA Aspirin/COreg  - no chest pain  ???  Recent Mechanical fall  - CT head negative  - CT C spine was suspicious for possible lytic lesions to the c spine  - Consulted PT/OT   ???  HLD/HTN  - He is of his simvastatin and fenofibrate due to reported muscle weakness since last Thursday Nov 1st  - Continue PTA coreg and hydralazine  ???  Hypothyroidism  - TSH/Free T4 normal  - Continue PTA synthroid  ???  Aortic Valve stenosis   - S/P TAVR in 2017  ???  Euvolemic hypoNa  - asymptomatic   -  Urine osmol 227 and Na 70  - serum osmo 268  - TSH WNL. am cortisol 15, WNL  - regular diet     Anorexia, mild protein cal malnutrition?   - multiple etiology   - could be related to antibiotics, current illness, anemia, disliking hospital food  - patient pulled corpak    Dysphagia  - concern for aspiration pneumonia and empyema  - reviewed Speech therapy note from initial consultation, however patient continues to cough during feedings  - patient pulled out corpak last night and refused replacement  - consulted Speech to re-evaluate, recommend NPO and Videoswallow study, will do this today    PT/OT recommneds inpatient setting    FEN - lytes stable  - no IVF    Ppx  - SCDs, will hold chemical ppx in case of PEG placement    Disp  - continue inpatient care    SUBJECTIVE   Patient seen and examined.  He has no concerns today, he is not interested in another feeding tube.  He currently has no concerns.  No fever or chills.  No significant pain.  No dyspnea.     OBJECTIVE     VITAL SIGNS   BP: 154/82 (11/13 0751)  Temp: 36.4 ???C (97.6 ???F) (11/13 0751)  Pulse: 78 (11/13 0751)  Respirations: 18 PER MINUTE (11/13 0751)  SpO2: 97 % (11/13 0751)  O2 Delivery: None (Room Air) [id number] [id number])       PHYSICAL EXAM   General: ???Alert and response to question appropriately. ???VS as above. No acute distress . ??????  Lungs: ???No use of accessory muscles. ???Clear to auscultation bilaterally without wheezes, rales, or rhonchi.  Decrease BS at the bases more over the L lung  Cardiovascular: RRR, ???No edema in BLE.   Abdomen: ???BS+, soft, ND, ???no guarding or rigidity. ???Nontender to palpation.  Skin: Skin color normal, no obvious evidence of rashes.   Neurologic: ???Alerted and oriented x 3. ???  Psych:??????Alerted and oriented, calm, normal affect    LAB REVIEW     Recent Labs      12/03/16   0600  12/04/16   0559   WBC  13.0*  10.9   HGB  8.9*  9.0*   HCT  25.9*  26.7*   PLTCT  515*  493*   MCV  91.2  89.4     Recent Labs      12/03/16   0600  12/04/16   0559   NA  130*  132*   K  3.6  3.9   CL  100  101   CO2  25  24   BUN  12  12   CR  0.94  1.00   GAP  5  7   GFR  >60  >60   ALBUMIN  2.0*  2.1*   TOTBILI  0.4  0.4   AST  11  13   ALT  7  4*       MEDICATIONS   Scheduled Meds:    aspirin EC tablet 81 mg 81 mg Oral QDAY   carvedilol (COREG) tablet 12.5 mg 12.5 mg Oral BID   cilostazol(+) (PLETAL) tablet 50 mg 50 mg Oral BID before meals   dutasteride (AVODART) capsule 0.5 mg 0.5 mg Oral QDAY   enoxaparin (LOVENOX) syringe 40 mg 40 mg Subcutaneous QDAY(21)   levothyroxine (SYNTHROID) tablet 100 mcg 100 mcg Oral QDAY   montelukast (  SINGULAIR) tablet 10 mg 10 mg Oral QDAY pantoprazole (PROTONIX) injection 40 mg 40 mg Intravenous QDAY   piperacillin/tazobactam  (ZOSYN) 4.5 g/100 mL iso-osmotic IVPB 4.5 g Intravenous Q6H*   polyethylene glycol 3350 (MIRALAX) packet 17 g 1 packet Oral BID   senna/docusate (SENOKOT-S) tablet 1 tablet 1 tablet Oral BID   vancomycin (VANCOCIN) 1,250 mg in dextrose 5% (D5W) IVPB 1,250 mg Intravenous Q24H*   Continuous Infusions:    PRN and Respiratory Meds:albuterol Q4H PRN, pancrelipase 20,000 Units/ sodium bicarbonate 650 mg(#) PRN (On Call from Rx), [DISCONTINUED] vancomycin   IVPB Q12H* **AND** vancomycin, pharmacy to manage Per Pharmacy      RADIOLOGY AND OTHER DIAGNOSTIC PROCEDURES REVIEW     iology and Othe  Pertinent radiology reviewed.

## 2016-12-04 NOTE — Progress Notes
PHYSICAL THERAPY  NOTE         Patient was unavailable for physical therapy. OT at bedside.  Physical therapy will continue to follow and provide intervention as indicated.    Therapist: Barnett HatterAmber Brant Peets  Date: 12/04/2016

## 2016-12-04 NOTE — Progress Notes
OCCUPATIONAL THERAPY  PROGRESS NOTE    Patient Name: Javier Gutierrez                   Room/Bed: ZO1096/04  Admitting Diagnosis:       Mobility  Progressive Mobility Level: Walk in hallway  Distance Walked (feet): 150 ft  Level of Assistance: Assist X1  Assistive Device: None  Time Tolerated: 11-30 minutes  Activity Limited By: Fatigue    Subjective  Pertinent Dx per Physician: PMH significant for HTN, HLD, CAD, aortic stenosis s/p TAVR, CKD, PAD, lifetime smoker, chronic diastolic heart failure. Admitted 11/20/16 with weakness and cough. Found atelectasis and L PE with chest tube placed.   Precautions: Falls  Pain / Complaints: Patient has no c/o pain;Patient agrees to participate in therapy    Objective  Persons Present: Daughter    Home Living  Type of Home: Apartment (independent living)  Home Layout: One Level  Financial risk analyst / Tub: Medical sales representative: Standard  Bathroom Equipment: Engineer, materials in [employer] Equipment: Environmental consultant (did not use prior to fall a week ago)    Prior Function  Level Of Independence: Independent with ADLs and functional transfers;Needed assistance with homemaking  Lives With: Alone  Receives Help From: Family  Other Function Comments: ILF does not provide meals. Pt's daughter states he eats out for breakfast and lunch and snacks the rest of the day. He has someone who comes to clean his apartment.    ADL's  Where Assessed: Edge of Bed;Standing at [facility];In Bathroom  Eating Deficits: NPO, but hand to mouth WNL;Awaiting Test  Grooming Assist: Minimal Assist  Grooming Deficits: Verbal Cueing;Setup (encouragement to initiate hygiene tasks due to disinterest)  LE Dressing Assist: Minimal Assist (donns sweatpants, doff/donns socks, donns slip on shoes)  Toileting Assist: Maximum Assist (urinal use while sitting in the chair)  Toileting Deficits: Setup  Functional Transfer Assist: Moderate Assist (supine to sit)  Functional Transfer Deficits: Verbal Cueing (hand placement) Comment: In addition to above ADL's, patient ambulates 150 ft. around the unit without assistive device with minimal assist and verbal cueing to pick up feet to avoid shuffling. Patient denies dizziness and reports feeling OK. Patient ends sitting in the chair.    Activity Tolerance  Endurance: 4/5 Tolerates 30+ Minutes Exercise W/O Fatigue  Sitting Balance: 5/5 Moves/Returns Trunkal Midpoint in All Planes > 2 Inches    Cognition  Overall Cognitive Status: WFL to Adequately Complete Self Care Tasks Safely  Comprehension: Hard of Hearing    Assessment  Assessment: Decreased Endurance;Decreased Self-Care Trans;Decreased High-Level ADLs  Prognosis: Good;w/Cont OT s/p Acute Discharge  Comments: Patient continues to benefit from ongoing therapies to increase strength and function.     AM-PAC 6 Clicks Daily Activity Inpatient  Putting on and taking off regular lower body clothes?: A Little  Bathing (Including washing, rinsing, drying): A Little  Toileting, which includes using toilet, bedpan, or urinal: A Little  Putting on and taking off regular upper body clothing: A Little  Taking care of personal grooming such as brushing teeth: A Little  Eating meals?: Total (NPO, awaiting test)  Daily Activity Raw Score: 16  Standardized (t-scale) score: 35.96  CMS 0-100% Score: 53.32  CMS G Code Modifier: CK    Plan  OT Frequency: 5x/week  OT Plan for Next Visit: endurance building, bring theraband, flat bed mobility      ADL Goals  Patient Will Perform Grooming: Standing at Sink;w/ Stand By Assist  Patient  Will Perform LE Dressing: w/ Stand By Assist  Patient Will Perform Toileting: w/ Stand By Assist    Functional Transfer Goals  Pt Will Perform All Functional Transfers: w/ Stand By Assist        OT Discharge Recommendations  OT Discharge Recommendations: Inpatient Setting, To address deficits, maximize function and improve safety.     Recommend ongoing assistance for: Transfers, Bed mobility, Ambulation, Stairs, Safety concerns, In and out of house        Therapist: Theotis Burrow, [place] [id number]  Date: 12/04/2016

## 2016-12-04 NOTE — Progress Notes
Reason for Visit: Referral from the [employer]  min[employer],     Faith/Religion: Roman catholic,     Source of Purpose/Meaning: Pt though has been in the hospital over two weeks still making very slow progress, complain of  weakness, provided pastoral support, offered prayer,     Worries/Concerns/Struggles:     Method(s) of Coping:     Support System:      Interventions/Plan:

## 2016-12-04 NOTE — Patient Education
Medication Education    Javier Gutierrez accepted counseling and was receptive.  Javier Gutierrez needs reinforcement.    The following medications were discussed:  protonix  Zosyn  Video swallow test    Where indicated, the patient was provided with additional medication and/or disease-state information.  All patient questions were answered and patient acknowledged understanding of the medications, side effects and other pertinent medication information.    Follow up should occur daily.    Continue to address: certain medications    Javier LikensAshleigh Gerhard Rappaport, RN

## 2016-12-04 NOTE — Patient Education
Medication Education    Letta Moynahanhomas Chao accepted counseling and was receptive.  he needs reinforcement.    The following medications were discussed:  Carvedilol, Enoxaparin, Piperacillin, Miralax, Senna.    Where indicated, the patient was provided with additional medication and/or disease-state information.  All patient questions were answered and patient acknowledged understanding of the medications, side effects and other pertinent medication information.    Follow up should occur daily.    Continue to address: indications    [place] Javier Gutierrez , RN

## 2016-12-04 NOTE — Progress Notes
SPEECH-LANGUAGE PATHOLOGY  VIDEOSWALLOW ASSESSMENT     EVALUATION SUMMARY  Videoswallow Summary*: A videoswallow study was completed this date. Results suggest moderate/severe oropharyngeal dysphagia characterized by trace silent aspiration thin and nectar thick liquids before to during the swallow secondary to mistimed airway protection and incomplete laryngeal vestibular closure. Aspiration events noted to be silent with no cough response appreciated. When penetration/aspiration events did occur and volitional cough was prompted, cough noted to be weak. Incidence of penetration/aspiration reduced but not eliminated with nectar thick liquids as opposed to thin liquids and with smaller bolus volumes (liquids by teaspoon). However, pt continued to present with inconsistent penetration/aspiration nectar as well as increased residue as viscosity increased (nectar, pudding barium). Other strategies attempted, but with minimal effect to detrimental effects, including chin tuck and effortful swallow due to residue in pyriforms and cognitive impairment. Overall, pt with only trace aspiration; however, anticipate these to be cumulative over course of meal and pt also with increased pharyngeal residue and difficulty clearing. Suspect dysphagia to be potential source, at least in part, of respiratory status changes. Anticipate generalized weakness/debility superimposed on baseline dysphagia consistent with COPD and dementia to be primary source of dysphagia. Will plan to speak with primary team on 11/14 regarding findings. Do not anticipate pt will safely maintain nutrition via PO intake alone in next 7-10 days; however, remains unclear if pt agreeable to PEG. Further, question prognosis for improvement in swallow in near future so may wish to consider discussions regarding goals of care. Please see below.      RECOMMENDATIONS  NPO with consideration of Corpak Meds via alternative source as able or high priority meds with thin liquid water  Excellent oral care  Ice chip protocol (10-15 per hour, only as tolerated)  SLP will follow for ongoing assessment of swallow. Ongoing SLP at next level of care    MBSImp Scale:     Lip closure for intraoral bolus containment resulted in no labial escape. Tongue control during bolus hold allowed posterior escape of greater than half the bolus. Bolus preparation and mastication could not be assessed. No solid was given do to safety concerns or logistical reasons not related to mastication. Bolus transport/lingual motion was with repetitive/disorganized motion of the tongue. Oral residue was a collection on oral structures.     Initiation of the pharyngeal swallow occured when the bolus head was in the pyriform sinuses. Soft palate elevation resulted in no bolus between the soft palate and the pharyngeal wall. Laryngeal elevation was decreased, with partial superior movement of the thyoid cartilage/partial approximation of the arytenoids to the epigoittic  petiole. Anterior hyoid excursion demonstrated partial anterior movement. Epiglottic movement resulted in no inversion. Laryngeal vestibular closure was absent, resulting in a wide column of air/contrast within the laryngeal vestibule of the height of the swallow. Pharyngeal stripping wave was present, but diminished. Pharyngeal contraction could not be assessed due to logistical reasons not related to physiologic impairment. Pharyngoesophageal segment opening demonstrated minimal distention/minimal duration, with marked obstruction of bolus flow. Tongue base retraction allowed a narrow column of contrast of air between the retracted tongue base and the posterior pharyngeal wall. Pharyngeal residue was the majority of contrast within or on pharyngeal structures. Esophageal clearance in the upright position could not be assessed due to logistical reasons not related to physiologic impairment.     Plan*: Continue Treatment 2-3x/ Week, Patient Would Benefit from Further Speech Therapy Post Acute Hospitalization.    Prognosis*: Fair, Guarded    NOMS Dysphagia Rating*:  2-Moderately-Severe Dysphagia -Not able to swallow safely by mouth for nutrition/hydration but may take some consistency w/ consistent max cues in therapy only. Alternative method of feeding required.    Penetration Aspiration Scale*: 8 - Material enters laryngeal vestibule, passes below vocal folds & no effort is made to eject    Objective*  Relevant Med Background: 76 year old man with history of hypertension, hyperlipidemia, coronary artery disease, aortic stenosis, chronic kidney disease, peripheral artery disease, and lifetime smoker who presents with concern for weakness and cough.  Found to have complicated empyema.  Chest tube was inserted.  Pulmonary treated the empyema with TPA/dornase.  CT surgery decided there is no surgical indication for decortication.  There was concern on the CT for possible obstruction pneumonia and infection disease recommended to have bronchoscopy.    Chest 2 views 11/12 impressions  Persistent consolidation of the left lower lobe associated with partially loculated left pleural effusion. The findings remain consistent with pneumonia and either parapneumonic effusion or empyema on the left. Patchy parenchymal opacities in the right upper lobe as well as a small right pleural effusion.    Chest single view 11/11 impressions  Interval removal of left thoracostomy tube with no evidence of new pneumothorax. Unchanged consolidation volume loss in the left lower lobe with partially loculated left pleural effusion.    Lives With: Alone  Receives Help From: Family  Psychosocial Status: Willing and Cooperative to Participate  Persons Present: Daughter    Subjective*  Pain: Patient has no complaint of pain  Trach Presence: No  Feeding Tube Present During Eval: None    Nutrition* Nutrition Prior To Hospitalization: Oral, Regular, Thin Liquids  Current Form Of Nutrition: NPO    Views / Programmer, applications*  Views / Seating: Lateral View, Sitting at 90 Degrees Upright    Barium Consist/Presentation*  Presentations: Therapist Fed, Patient Fed Self  Thin Liquid: 1/2 Tsp, Cup  Nectar Thick Liquid: 1 Tsp, Cup  Other Consistencies: Pudding    Swallow Strategies  Small Bites and Sips: Ineffective  Slow Rate of Intake: Ineffective  Alternate Solids / Liquids: Not Effective  Multiple Swallows: Not Effective  Chin Down: Not Effective  Cough / Throat Clear: Not Effective (weak )  Thermal Stimulation Cold: Not Effective  Effortful Swallow: Not Effective    Education*  Persons Educated: Pt/Family  Barriers To Learning: Cognitive Deficits  Interventions: Family Educated, Scientist, research (physical sciences) Methods: Verbal  Topics: Dysphagia  Patient Response: Verbalized Understanding  Goal Formulation: With Pt/Family    Videoswallow Goals*  Goal : Pt will tolerate ice chip protocol with less than 10% overt s/s aspiration and free from respiratory status changes.   Goal : Pt will complete pharyngeal strengthening exercises with 70% accuracy and minimal cues.     G-Codes: Swallowing  G [id number] Current Status: 80-99% Impairment   G [id number] Goal Status: 40-59% Impairment   Based on above evaluation and clinical judgment.      Therapist: Margit Banda, MS,CCC-SLP [phone]  Date: 12/04/2016

## 2016-12-04 NOTE — Progress Notes
Physical Medicine & Rehabilitation Progress Note     Patient name: Javier Gutierrez  Patient age: 76 y.o.  Today's Date:  12/04/2016  Admission Date: 11/20/2016  LOS: 14 days                     Assessment/Plan:     Principal Problem:    Parapneumonic effusion  Active Problems:    Tobacco use disorder    CAD (coronary artery disease)    Chronic diastolic (congestive) heart failure (HCC)    Pneumonia    Sepsis (HCC)    Acute respiratory failure with hypoxia (HCC)    Hyponatremia      Javier Gutierrez is a 76 y.o. male admitted to [facility] on 11/20/2016 with the following issues:    Debility 2/2 sepsis, complicated para pneumonic effusion leading to acute respiratory failure.   ???  Impairments: weakness  Activity Limitations:  dressing - upper, dressing - lower, transfers and ambulation  Participation Restrictions: unable to return home safely    This is a follow up visit from initial consultation performed on 11/2    Hospital Course: Mr Cueto is a 76 y/o male w/ Hx of diastolic CHF, AS s/p TAVR, CAD, CKD, HTN, who presented to [facility] ED on 10/30 with weakness and SOB. He was found on chest CT to have Lt sided lung consolidation concerning for PNA w/ effusion and a suspicious lumg mass. Pulmonolgy was consulted, placed pigtail CT to suction, fluid analysis consistent with complicated pneumonic effusion, CT placed to wall suction (cytology pending). Empiric abx therapies initiated. The patient's stay has been complicated by acute respiratory failure with new oxygen requirement, sepsis, shock. Repeat chest CT on 11/3 with small Lt hydropneumothorax, persistent Lt consolidation, new Rt lung infection followed by interval chest CT on 11/5 w/ slight inc patchy ground glass opacities Rt lung. CT removed. The patient is currently NPO for aspiration risk, refusing corpak, SLP following for further assessment and recommendations. IV abx persist for CAP with complicated left parapneumonic pleural effusion, ID following.       Recommendations:    The patient certainly continues to make significant functional goals with rehab therapies. He is currently NPO for aspiration risk and refusing alternate sources of nutrition. SLP is following with further recommendations pending completion of current work up. The patient will need a plan for nutritional intake as his caloric needs will increase once intensive rehab therapies are initiated at acute inpatient rehab.      Beverly Sessions, MD    Subjective     Javier TEBBETTS is a 76 y.o. male.  Patient reports he is anxious about his swlaaow study today stating he wants to eat. He denies N&V, SON, CP.         Current Level Of Function:   PT Gait:Gait Distance: 300 feet Gait: Assistance Level: Minimal Assist Gait: Assistive Device: Roller Walker, Hand Hold Assist  Bed Mobility/Transfers  Bed Mobility: Supine to Sit: Minimal Assist, Head of Bed Elevated, Use of Rail, Assist with Trunk, Requires Extra Time (anticipate more moderate assist with HOB flat)  Bed Mobility: Sit to Supine: Moderate Assist, Assist with B LE, Assist with Trunk  Comments: Also required assist to boost in bed.   Transfer Type: Sit to Stand  Transfer: Assistance Level: From, Bed, Minimal Assist (contact assist)  Transfer: Assistive Device: Nurse, adult  Transfers: Type Of Assistance: For Balance, For Strength Deficit, Verbal Cues, For Safety Considerations  Other Transfer Type: Stand Pivot  Other Transfer: Assistance Level: To, Bed Side Chair, Minimal Assist  Other Transfer: Assistive Device: Roller Walker  Other Transfer: Type Of Assistance: For Strength Deficit, For Balance, Verbal Cues, For Safety Considerations  End Of Activity Status: Up in Chair, Nursing Notified, Instructed Patient to Request Assist with Mobility, Instructed Patient to Use Call Light (TABS alarm in place, brother at bedside) Comments: Patient returned supine in bed with bed alarm activated and call light within reach.      OT ADL's  Where Assessed: Edge of Bed, Standing at [employer], In [employer]  Eating Deficits: NPO, but hand to mouth WNL, Awaiting Test  Grooming Assist: Minimal Assist  Grooming Deficits: Verbal Cueing, Setup (encouragement to initiate hygiene tasks due to disinterest)  LE Dressing Assist: Minimal Assist (donns sweatpants, doff/donns socks, donns slip on shoes)  LE Dressing Deficits: Thread RLE Into Underwear (pt able to pull brief up over hips without issue)  Toileting Assist: Maximum Assist (urinal use while sitting in the chair)  Toileting Deficits: Setup  Functional Transfer Assist: Moderate Assist (supine to sit)  Functional Transfer Deficits: Verbal Cueing (hand placement)  Comment: In addition to above ADL's, patient ambulates 150 ft. around the unit without assistive device with minimal assist and verbal cueing to pick up feet to avoid shuffling. Patient denies dizziness and reports feeling OK. Patient ends sitting in the chair.     SLP COGNITIVE EVALUATION SUMMARY       PRAGMATICS:      BEHAVIOR:      AUDITORY COMPREHENSION:        ORIENTATION:      AUDITORY ATTENTION/WORKING MEMORY:      AUDITORY MEMORY/SUSTAINED ATTENTION:      NEW LEARNING:      SEQUENCING/ORGANIZATION:      PROBLEM SOLVING:      REASONING:        MATH/MONEY SKILLS:        VISUAL PERCEPTUAL:      SWALLOW EVALUATION SUMMARY  Summary: A clinical swallow evaluation was completed this date.     Oral Stage Summary*: Pt presented ice chips via spoon, thin liquid water via cup, pudding via spoon. Pt requiring cues to accept boluses between each trial. Pt with adequate bolus procurement. Slowed oral movements and AP transfer. Intermittently wiith oral holding at onset of trials.     Pharyngeal Stage Summary*: Suspect delayed swallow initaition. No voert s/s aspiration with ice chips. Throat clear in 10% of trials with small sips thin liquids via cup. Pt did not take large drinks of thin liquids in the presnence of cues. Pt also with intermittent mutliple swallows and vocal quality changes for thin liquids. Overt cough with pudding x2 as well as mutliple swallows and vocal quality changes. Would have suspicion for silent aspiration risk due to generalized weakness, weak cough/weak vocal quality.     Swallow Recommendations*  NPO:  (pt declines alternative source of nutrition)  PO: Ice Chips Only    Plan: Continue Treatment __x/week (Comment)., Other (Comment), Patient Would Benefit from Further Speech Therapy Post Acute Hospitalization. (2-3x/wk)    Prognosis: Fair, Good           Review of Systems -     A 10 point review of systems was negative accept where reported in the HPI    Medications  Scheduled Meds:    aspirin EC tablet 81 mg 81 mg Oral QDAY   carvedilol (COREG) tablet 12.5 mg 12.5 mg Oral BID  cilostazol(+) (PLETAL) tablet 50 mg 50 mg Oral BID before meals   dutasteride (AVODART) capsule 0.5 mg 0.5 mg Oral QDAY   enoxaparin (LOVENOX) syringe 40 mg 40 mg Subcutaneous QDAY(21)   levothyroxine (SYNTHROID) tablet 100 mcg 100 mcg Oral QDAY   montelukast (SINGULAIR) tablet 10 mg 10 mg Oral QDAY   pantoprazole (PROTONIX) injection 40 mg 40 mg Intravenous QDAY   piperacillin/tazobactam  (ZOSYN) 4.5 g/100 mL iso-osmotic IVPB 4.5 g Intravenous Q6H*   polyethylene glycol 3350 (MIRALAX) packet 17 g 1 packet Oral BID   senna/docusate (SENOKOT-S) tablet 1 tablet 1 tablet Oral BID   vancomycin (VANCOCIN) 1,250 mg in dextrose 5% (D5W) IVPB 1,250 mg Intravenous Q24H*   Continuous Infusions:  PRN and Respiratory Meds:albuterol Q4H PRN, pancrelipase 20,000 Units/ sodium bicarbonate 650 mg(#) PRN (On Call from Rx), [DISCONTINUED] vancomycin   IVPB Q12H* **AND** vancomycin, pharmacy to manage Per Pharmacy      Objective                        Vital Signs: Last Filed                 Vital Signs: 24 Hour Range   BP: 154/82 (11/13 0751) Temp: 36.4 ???C (97.6 ???F) (11/13 0751)  Pulse: 78 (11/13 0751)  Respirations: 18 PER MINUTE (11/13 0751)  SpO2: 97 % (11/13 0751)  O2 Delivery: None (Room Air) (11/13 0751) BP: (126-155)/(54-82)   Temp:  [36.4 ???C (97.6 ???F)-36.9 ???C (98.4 ???F)]   Pulse:  [71-78]   Respirations:  [14 PER MINUTE-18 PER MINUTE]   SpO2:  [91 %-98 %]   O2 Delivery: None (Room Air)     Vitals:    11/20/16 0518 11/27/16 1316   Weight: 68 kg (150 lb) 68 kg (150 lb)       Intake/Output Summary:  (Last 24 hours)    Intake/Output Summary (Last 24 hours) at 12/04/16 0956  Last data filed at 12/04/16 0600   Gross per 24 hour   Intake                0 ml   Output              400 ml   Net             -400 ml      Stool Occurrence: 0       Oral Diet Order: Regular  Last Bowel Movement Date: 12/03/16             Physical Exam  VS: BP 154/82 (BP Source: Arm, Right Upper)  - Pulse 78  - Temp 36.4 ???C (97.6 ???F)  - Ht 177.8 cm (70)  - Wt 68 kg (150 lb)  - SpO2 97%  - BMI 21.52 kg/m???   Gen: AOX3, NAD  HEENT: EOMI, MMM  Heart: Extremtities well perfused   Lungs: Good inspiratory effort without recruitment of accessory muscles   Abd: Soft, non-distended  Psych: Pleasant mood

## 2016-12-04 NOTE — Progress Notes
PHYSICAL THERAPY  PROGRESS NOTE       MOBILITY:  Mobility  Progressive Mobility Level: Walk in hallway  Distance Walked (feet): 300 ft  Level of Assistance: Assist X1  Assistive Device: None;Hand Held    SUBJECTIVE:  Subjective  Significant hospital events: PMH significant for HTN, HLD, CAD, aortic stenosis s/p TAVR, CKD, PAD, lifetime smoker, chronic diastolic heart failure. Admitted 11/20/16 with weakness and cough. Found atelectasis and L PE with chest tube placed.   Mental / Cognitive Status: Alert;Cooperative;Follows Commands (attempts to decline, easily redirected, always says no)  Persons Present: Daughter  Pain: Patient has no complaint of pain  Pain Interventions: Patient agrees to participate in therapy  Ambulation Assist: Independent Mobility in [employer] with Device  Patient Owned Equipment: Nurse, adult  Home Situation: Lives Alone  Type of Home: Apartment (independent living)  Entry Stairs: No Stairs  In-Home Stairs: No Stairs       BED MOBILITY/TRANSFERS:  Bed Mobility/Transfers  Bed Mobility: Supine to Sit: Merchant navy officer Type: Sit to Stand  Transfer: Assistance Level: From;Bed;Standby Assist  Transfer: Assistive Device: Nurse, adult  Transfers: Type Of Assistance: For Balance;For Strength Deficit;Verbal Cues;For Safety Considerations  Other Transfer Type: Stand Pivot  Other Transfer: Assistance Level: To;Bed Side Chair;Standby Assist  Other Transfer: Assistive Device: Nurse, adult  Other Transfer: Type Of Assistance: For Strength Deficit;For Balance;Verbal Cues;For Safety Considerations  End Of Activity Status: Up in Chair;Nursing Notified;Instructed Patient to Request Assist with Mobility;Instructed Patient to Use Call Light (TABS alarm in place, transport arrived for video swallow)       GAIT:  Gait  Gait Distance: 300 feet  Gait: Assistance Level: Minimal Assist  Gait: Assistive Device: Hand Hold Assist  Gait: Descriptors: Pace: Slow;Forward trunk flexion Comments: patient more upright with ambulating without device, otherwise pushes walker too far away from base of support. shuffles R foot- consistent cues to pick up foot.   Activity Limited By: Complaint of Fatigue;Weakness       ASSESSMENT/PROGRESS:  Assessment/Progress  Impaired Mobility Due To: Decreased Strength;Impaired Balance;Decreased Activity Tolerance;Deconditioning  Assessment/Progress: Should Improve w/ Continued PT  Comments: continues to improve. anticipate rehab stay will assist with function and patient routine.        AM-PAC 6 Clicks Basic Mobility Inpatient  Turning from your back to your side while in a flat bed without using bed rails: None  Moving from lying on your back to sitting on the side of a flatbed without using bedrails : None  Moving to and from a bed to a chair (including a wheelchair): A Little  Standing up from a chair using your arms (e.g. wheelchair, or bedside chair): A Little  To walk in hospital room: A Little  Climbing 3-5 steps with a railing: A Little  Raw Score: 20  Standardized (T-scale) Score: 43.99  Basic Mobility CMS 0-100%: 33.32  CMS G Code Modifier for Basic Mobility: CJ    GOALS:  Goals  Goal Formulation: With Patient/Family  Time For Goal Achievement: 5 days  Pt Will Go Supine To/From Sit: w/ Stand By Assist, Ongoing  Pt Will Transfer Bed/Chair: w/ Stand By Assist, Ongoing  Pt Will Transfer Sit to Stand: w/ Stand By Assist, Ongoing  Pt Will Ambulate: Greater than 200 Feet, w/ Walker, w/ Stand By Assist, Ongoing    PLAN:  Plan   Treatment Interventions: Mobility Training;Strengthening;Balance Activities;Endurance Training  Plan Frequency: 5 Days per Week  PT Plan for Next Visit: Progress ambulation as tolerated, transfer  to chair to promote increased time out of bed, higher level balance, LE strengthening    RECOMMENDATIONS:  PT Discharge Recommendations  PT Discharge Recommendations: Inpatient Setting;Recommend Physical Medicine and Rehabilitation Consult to address most appropriate level of rehabilitation placement (as patient with generalized deconditioning and lives alone)  Equipment Recommendations: Nurse, adult    Therapist: Joice Lofts Angles Trevizo  Date: 12/04/2016

## 2016-12-04 NOTE — Progress Notes
CLINICAL NUTRITION                                                        Clinical Nutrition Follow-Up Summary     NAME:Javier Gutierrez             MRN: [id number]             DOB:1940-07-05          AGE: 76 y.o.  ADMISSION DATE: 11/20/2016             DAYS ADMITTED: LOS: 14 days    Nutrition Assessment of Patient:  Malnutrition Assessment: Does not meet criteria  Current Oral Intake: NPO  Estimated Calorie Needs: 1700-1840 kcal (25-27 kcal/kg present wt 68kg)  Estimated Protein Needs: 82-88g (1.2-1.3g/kg present wt 68kg)  Oral Diet Order: NPO  Oral Supplement: Boost Plus, TID  Current EN Order: Isosource 1.5 1 carton TID to provide 1125 kcal, 51 grams protein, and 573 mL free H2O (currently held; pt pulled corpak 11/11)     Comments:  76 year old man with history of hypertension, hyperlipidemia, coronary artery disease, aortic stenosis, CKD, PAD, and lifetime smoker who presents with concern for weakness and cough. Per daughter he is reported to have a fall one week ago where he was down for 15 hours and generalized worsening fatigue over the last few weeks. Also with stage I coccyx pressure injury per RN documentation. Pt is being followed by clinical nutrition services for poor intake PTA & throughout admission. He is s/p SLP 10/30 who recommended NPO given concern for aspiration. However, cleared for regular textures 10/31 following VSS. PO intake was improving, though remained inadequate so corpak placed 11/10 (tip overlying the gastric antrum per imaging) & bolus feeds initiated. Pt pulled corpak 11/11; downgraded back to NPO 11/12 s/p SLP eval given concern for aspiration. Plan for VSS today with replacement of corpak if needed.     Recommendation:  If pt remains NPO & corpak replaced, REC Isosource 1.5 with goal rate of 52mL/hr. H2O bolus Q4hr. Will provide at goal 1800 kcal, 82g protein & free water (EN + water boluses) daily    If cleared for PO intake, diet textures per SLP; no therapeutic restrictions necessary at this time. Would consider corpak placement regardless (if pt agreeable) given marginal intake throughout admission. If able to advance diet, REC initiating nocturnal feeds of Isosource 1.5 @ 40mL/hr x 12hrs to provide ~50% of estimated needs (900 kcal & 41g protein)                       Intervention / Plan:  Will monitor NPO status, diet progression vs. need for EN feeds  Will continue to monitor GI function, wt trends, labs    Nutrition Diagnosis:  Inadequate oral intake  Etiology: prior decreased appetite, dysphagia  Signs & Symptoms: son report, current NPO status    Goals:  PO intake to meet >50% nutritional needs  Time Frame: Within 72 Hours  Status: no longer appropriate;new goal established     Initiate nutrition  Time Frame: Within 48 Hours      [place] Luetkemeyer, MS, RD, LD  Pager: [phone]  Phone: [phone]

## 2016-12-05 LAB — PROTIME INR (PT): Lab: 1.2 mg/dL — ABNORMAL LOW (ref 0.8–1.2)

## 2016-12-05 LAB — CBC AND DIFF: Lab: 9.1 K/UL — ABNORMAL HIGH (ref >60)

## 2016-12-05 LAB — COMPREHENSIVE METABOLIC PANEL
Lab: 101 MMOL/L — ABNORMAL LOW (ref >60)
Lab: 134 MMOL/L — ABNORMAL LOW (ref >60)
Lab: 3.6 MMOL/L — ABNORMAL LOW (ref 3.5–5.1)

## 2016-12-05 LAB — VANCOMYCIN TROUGH: Lab: 19 ug/mL (ref 10.0–20.0)

## 2016-12-05 MED ORDER — LACTATED RINGERS IV SOLP
INTRAVENOUS | 0 refills | Status: CN
Start: 2016-12-05 — End: ?

## 2016-12-05 MED ORDER — VANCOMYCIN 1G/250ML D5W IVPB (VIAL2BAG)
1 g | INTRAVENOUS | 0 refills | Status: DC
Start: 2016-12-05 — End: 2016-12-06

## 2016-12-05 NOTE — Patient Education
Medication Education    Javier Gutierrez accepted counseling and was engaged.  he verbalized understanding.    The following medications were discussed:  Lovenox  zosyn    Where indicated, the patient was provided with additional medication and/or disease-state information.  All patient questions were answered and patient acknowledged understanding of the medications, side effects and other pertinent medication information.    Follow up should occur daily.    Continue to address: indications     Pt also educated on NPO, q2t, fall bundle    Dorena Bodoachel 

## 2016-12-05 NOTE — Consults
Gastroenterology Consult Note  Patient Name:Tobe KIEN COLOMBO         ZOX:[id number]  Admission Date: 11/20/2016  5:11 AM      Principal Problem:    Parapneumonic effusion  Active Problems:    Tobacco use disorder    CAD (coronary artery disease)    Chronic diastolic (congestive) heart failure (HCC)    Pneumonia    Sepsis (HCC)    Acute respiratory failure with hypoxia (HCC)    Hyponatremia      Reason for consult:  Peg placement    Assessment:  76 yo F w/ PMHx of hypertension, hyperlipidemia, coronary artery disease, aortic stenosis, chronic kidney disease, peripheral artery disease, and lifetime smoker who presents with concern for weakness and cough.  Per daughter he is reported to have a fall one week ago where he was down for 15 hours and generalized worsening fatigue over the last few weeks.    # FTT and hypoalbuminemia  # no appetite  # new aspiration on swallow studies  DDx: weakness due to prolonged hospital stay and infections causing weakness in the muscles used to swallow  - swallow motion studies show aspiration with thin liquids positive on 12/04/16, and 11/21/16   - Corpak was placed earlier, but was pulled by patient and refused replacement    I explained to his daughter, [name], and dPOA that this will not be able to reduce his risk for aspiration pneumonia, in fact it may even lead to more severe aspiration given that his stomach will be filled with tube feeds.    I explained to her that PEG placement does not reduce morbidity and mortality and that once placed, it needs to stay in place at least for 6 weeks before it can be removed again.    She is aware of these risks and still would like to proceed with the PEG placement hoping that this will improve some of his nutritional status and help his weakness.    Recommendations:  - Keep NPO past midnight for tentative PEG placement tomorrow    Patient discussed with attending on service, Dr. Francisco Capuchin.    Renea Ee, MD Gastroenterology & Hepatology Fellow  Pager [phone]      -----------------------------  HPI/Subjective:  76 yo F w/ PMHx of hypertension, hyperlipidemia, coronary artery disease, aortic stenosis, chronic kidney disease, peripheral artery disease, and lifetime smoker who presents with concern for weakness and cough.  Per daughter he is reported to have a fall one week ago where he was down for 15 hours and generalized worsening fatigue over the last few weeks. Loss of appetite, he has been hospitalized over the past two weeks with initially CAP with parapneumonic empyema, s/p chest tube placement 10/30 and subsequent hospital acquired pneumonia. Currently he has no other tubes or drains placed. He is c/o weakness and absence of appetite. He denies any choking or coughing when he eats. He denies any dysphagia, abdominal pain, constipation, N/V, melena, hematochezia.    Patient himself is not making medical decisions for himself. I spoke to his daughter, [name], over the phone as well regarding his medical conditions and the role of PEG.     PMH:  Past Medical History:   Diagnosis Date   ??? Acute on chronic systolic heart failure, NYHA class 2 (HCC)    ??? Aortic valve stenosis, mild 11/29/2008   ??? Arterial occlusion     left kidney-two stents placed   ??? Arthritis  lower back   ??? Asthma    ??? CAD (coronary artery disease) 11/29/2008   ??? Cardiomyopathy (HCC) 11/29/2008   ??? Carotid artery plaque    ??? Colon polyps    ??? Frailty    ??? 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 HTN    ??? Hyperlipidemia    ??? Hyperlipidemia 11/29/2008   ??? Hypertension 11/29/2008   ??? Hypothyroidism    ??? Tobacco abuse        Current medications:  No current facility-administered medications on file prior to encounter.      Current Outpatient Prescriptions on File Prior to Encounter   Medication Sig Dispense Refill   ??? aspirin EC 81 mg tablet Take 1 Tab by mouth daily. Take with food. 90 Tab 3   ??? carvedilol (COREG) 25 mg tablet Take 1 tablet by mouth twice daily. Take with food. 180 tablet 3   ??? cilostazol(+) (PLETAL) 50 mg tablet Take 50 mg by mouth twice daily. Take on an empty stomach at least 30 minutes before or 2 hours after food.     ??? dutasteride (AVODART) 0.5 mg PO capsule Take 0.5 mg by mouth Daily.     ??? fish oil- omega 3-DHA/EPA 300/1,000 mg capsule Take 1 Cap by mouth twice daily.     ??? hydrALAZINE (APRESOLINE) 100 mg tablet TAKE 1 TABLET BY MOUTH THREE TIMES DAILY 90 tablet 3   ??? levothyroxine (SYNTHROID) 100 mcg tablet Take 1 tablet by mouth daily.     ??? montelukast (SINGULAIR) 10 mg tablet TAKE ONE TABLET BY MOUTH ONCE DAILY 90 Tab 2     Past Surgical History:   Procedure Laterality Date   ??? HX LUMBAR DISKECTOMY  1968    30 years ago   ??? STENT INTRAVASCULAR  2006    kidney stent placed   ??? PERCUTANEOUS CORONARY INTERVENTION N/A 03/11/2015    Possible Percutaneous Coronary Intervention performed by Marcell Barlow, MD, [facility] at [facility]   ??? UPPER GASTROINTESTINAL ENDOSCOPY N/A 04/26/2015    ESOPHAGOGASTRODUODENOSCOPY performed by Tempie Hoist, DO at ENDO/GI   ??? UPPER GASTROINTESTINAL ENDOSCOPY  04/26/2015    ESOPHAGOGASTRODUODENOSCOPY BIOPSY performed by Tempie Hoist, DO at ENDO/GI   ??? AORTIC VALVE REPLACEMENT N/A 08/03/2015    REPLACEMENT TRANSCATHETER AORTIC VALVE (Sapien 26s3), right common femoral artery approach performed by Zella Richer, MD at [facility]   ??? BRONCHOSCOPY Bilateral 11/30/2016    BRONCHOSCOPY performed by Audie Box, MD at [facility]   ??? BRONCHOSCOPY Right 11/30/2016    BRONCHOSCOPY WITH BRONCHIAL ALVEOLAR LAVAGE - FLEXIBLE performed by Audie Box, MD at [facility]     Social History     Social History   ??? Marital status: Widowed     Spouse name: N/A   ??? Number of children: N/A   ??? Years of education: N/A     Occupational History   ??? Not on file.     Social History Main Topics   ??? Smoking status: Current Every Day Smoker     Packs/day: 0.50 Years: 60.00     Types: Cigarettes   ??? Smokeless tobacco: Never Used      Comment: 0.5-2 ppd history   ??? Alcohol use No   ??? Drug use: No   ??? Sexual activity: Not on file     Other Topics Concern   ??? Not on file     Social History  Narrative   ??? No narrative on file     Family History   Problem Relation Age of Onset   ??? Stroke Mother    ??? Other Mother         colon cancer   ??? Other Father         valve disease/COPD       Review of Systems:  General: No fevers, chills,+ weight loss, no B-symptoms. Weakness.  Oropharynx: No lesion, ulcer.  Neck: No pain or decreased ROM.  Pulm: No dyspnea, cough.  CV: No palpitations, no chest pain.  Abdomen: No abdominal pain, brown stool, no changes in bowel habits. Loss of appetite  Extremities: No swelling, no deformity.  Heme: No for easy bleeding or bruising.  Neuro: No lightheadedness/dizziness, no LOC.  Skin: No rashes, no jaundice.  Please see HPI for additional pertinent documentation    Physical Exam:  Vitals:    12/04/16 2105 12/04/16 2230 12/05/16 0317 12/05/16 0758   BP:  154/69 154/81 156/81   Pulse:  73 73 81   Temp:  36.8 ???C (98.3 ???F) 36.7 ???C (98 ???F) 36.4 ???C (97.6 ???F)   SpO2: 94% 94% 95% 94%   Weight:       Height:         General - Alert and oriented, no acute distress.   Head - Normocephalic, atraumatic.   Eyes -  EOMI grossly. No icterus or injection.   Oropharynx- No ulcer or bleeding, moist mucosa.  Neck - No swelling or tracheal deviation.   Pulmonary/Chest - Normal effort, bilateral crackles, L>R.  CV - Normal rate, regular rhythm, normal heart sounds and intact distal pulses.  Abd - ND, soft, Non-TTP. No hepatospenomegaly. Normal bowel sounds. No masses palpable.  Extremities - Warm, dry.   Skin - No exposed rash, lesion.  Neurological - AAOx2-3, No gross deficit    Labs/Imaging:  Pertient labs/imaging was reviewed on initiation of progress note.   24-hour labs:    Results for orders placed or performed during the hospital encounter of 11/20/16 (from the past 24 hour(s))   CBC AND DIFF    Collection Time: 12/05/16  5:53 AM   Result Value Ref Range    White Blood Cells 9.1 4.5 - 11.0 K/UL    RBC 3.34 (L) 4.4 - 5.5 M/UL    Hemoglobin 10.2 (L) 13.5 - 16.5 GM/DL    Hematocrit 16.1 (L) 40 - 50 %    MCV 91.9 80 - 100 FL    MCH 30.4 26 - 34 PG    MCHC 33.1 32.0 - 36.0 G/DL    RDW 09.6 (H) 11 - 15 %    Platelet Count 523 (H) 150 - 400 K/UL    MPV 7.0 7 - 11 FL    Neutrophils 72 41 - 77 %    Lymphocytes 14 (L) 24 - 44 %    Monocytes 9 4 - 12 %    Eosinophils 4 0 - 5 %    Basophils 1 0 - 2 %    Absolute Neutrophil Count 6.50 1.8 - 7.0 K/UL    Absolute Lymph Count 1.30 1.0 - 4.8 K/UL    Absolute Monocyte Count 0.80 0 - 0.80 K/UL    Absolute Eosinophil Count 0.40 0 - 0.45 K/UL    Absolute Basophil Count 0.10 0 - 0.20 K/UL   COMPREHENSIVE METABOLIC PANEL    Collection Time: 12/05/16  5:53 AM   Result Value Ref Range  Sodium 134 (L) 137 - 147 MMOL/L    Potassium 3.6 3.5 - 5.1 MMOL/L    Chloride 101 98 - 110 MMOL/L    Glucose 76 70 - 100 MG/DL    Blood Urea Nitrogen 12 7 - 25 MG/DL    Creatinine 1.61 0.4 - 1.24 MG/DL    Calcium 8.5 8.5 - 09.6 MG/DL    Total Protein 6.5 6.0 - 8.0 G/DL    Total Bilirubin 0.4 0.3 - 1.2 MG/DL    Albumin 2.3 (L) 3.5 - 5.0 G/DL    Alk Phosphatase 41 25 - 110 U/L    AST (SGOT) 19 7 - 40 U/L    CO2 24 21 - 30 MMOL/L    ALT (SGPT) 7 7 - 56 U/L    Anion Gap 9 3 - 12    eGFR Non African American >60 >60 mL/min    eGFR African American >60 >60 mL/min   VANCOMYCIN TROUGH    Collection Time: 12/05/16  5:53 AM   Result Value Ref Range    Vancomycin Trough 19.8 10.0 - 20.0 MCG/ML     Pertinent radiology reviewed.

## 2016-12-05 NOTE — Progress Notes
Pharmacy Vancomycin Note  Subjective:   Javier Gutierrez is a 76 y.o. male being treated for post-obstructive pneumonia.    Objective:     Current Vancomycin Orders   Medication Dose Route Frequency    [START ON 12/06/2016] vancomycin (VANCOCIN) 1,000 mg in dextrose 5% (D5W) 250 mL IVPB (Vial2Bag)  1 g Intravenous Q24H*    vancomycin, pharmacy to manage  1 each Service Per Pharmacy     Start Date of  Vancomycin therapy: 11/20/2016  Additional Abx: zosyn  Cultures: 10/30 Blood:NGTD, 10/30 pleural fluid strep intermedius, 11/9 BAL: GPCs,    White Blood Cells   Date/Time Value Ref Range Status   12/05/2016 0553 9.1 4.5 - 11.0 K/UL Final   12/04/2016 0559 10.9 4.5 - 11.0 K/UL Final   12/03/2016 0600 13.0 (H) 4.5 - 11.0 K/UL Final     Creatinine   Date/Time Value Ref Range Status   12/05/2016 0553 1.06 0.4 - 1.24 MG/DL Final   12/04/2016 0559 1.00 0.4 - 1.24 MG/DL Final   12/03/2016 0600 0.94 0.4 - 1.24 MG/DL Final     Blood Urea Nitrogen   Date/Time Value Ref Range Status   12/05/2016 0553 12 7 - 25 MG/DL Final     Estimated CrCl: 56 ml/min    Intake/Output Summary (Last 24 hours) at 12/05/16 0809  Last data filed at 12/05/16 0759   Gross per 24 hour   Intake                0 ml   Output              750 ml   Net             -750 ml      Actual Weight:  68 kg (150 lb)  Dosing BW:  68 kg   Drug Levels:  Vancomycin Trough   Date/Time Value Ref Range Status   12/05/2016 0553 19.8 10.0 - 20.0 MCG/ML Final       Assessment:   Target levels for this patient: trough ~15 mcg/mL.  Evaluation of level(s): Drawn appropriately, on the very high end of goal range    Plan:   1. Reduce dose to 1 g Q24H, resume dosing 11/15 @ 1200  2. Next scheduled level(s): Before 4th new dose or as clinically indicated  3. Pharmacy will continue to monitor and adjust therapy as needed.    Floydene Flock, [facility]  12/05/2016

## 2016-12-05 NOTE — Progress Notes
SPEECH-LANGUAGE PATHOLOGY  DAILY TREATMENT NOTE      Patient seen 1x this date.  Documentation reflects all daily treatment sessions.    SUMMARY OF THERAPY SESSION: pt seen for dysphagia therapy session. Discussed findings of videoswallow study, implications, and treatment plan. Discussed potential options of NPO with Corpak, NPO with peg, initiating modified diet with known risk for aspiration. Pt and family discussing that NPO with PEG with continued work on dysphagia maybe the best option for pt at this time. This would additionally provide more opportunities for rehab.  Will provide pharyngeal strengthening program. Pt and family to further discuss potential plans with primary team.     RECOMMENDATIONS  NPO with consideration of Corpak  Meds via alternative source as able or high priority meds with thin liquid water  Excellent oral care  Ice chip protocol (7-10 per hour, only as tolerated)  SLP will follow for ongoing assessment of swallow. Ongoing SLP at next level of care    Goal : Pt will tolerate ice chip protocol with less than 10% overt s/s aspiration and free from respiratory status changes.   Met  Comment: tolerating well with no overt s/s aspiration, though pt with documented risk for silent aspiration. No significant respiratory status changes reported since initiation of ice chips  Continue to address this goal    Goal : Pt will complete pharyngeal strengthening exercises with 70% accuracy and minimal cues.   Not addressed  Comment: to be provided  Continue to address this goal    PLAN / RECOMMENDATIONS:  Continue treatment 3x/week and Patient would benefit from further speech therapy post acute hospitalization.     Speech recommend ongoing assistance for: Swallow strategies, Safety concerns    Therapist: Serena Croissant, MS,CCC-SLP [phone]  Date: 12/05/2016

## 2016-12-05 NOTE — Anesthesia Pre-Procedure Evaluation
Anesthesia Pre-Procedure Evaluation    Name: Javier Gutierrez      MRN: [id number]     DOB: 1940/08/27     Age: 76 y.o.     Sex: male   __________________________________________________________________________     Procedure Date: 12/06/2016   Procedure: Procedure(s):  ESOPHAGOGASTRODUODENOSCOPY for PEG placement     Physical Assessment  Vital Signs (last filed in past 24 hours):  BP: 130/70 (11/14 1155)  Temp: 36.5 ???C (97.7 ???F) (11/14 1155)  Pulse: 72 (11/14 1155)  Respirations: 16 PER MINUTE (11/14 1155)  SpO2: 96 % (11/14 1155)  O2 Delivery: None (Room Air) (11/14 1155)      Patient History  Allergies   Allergen Reactions   ??? Sulfa (Sulfonamide Antibiotics) UNKNOWN        Current Medications    Medication Directions   aspirin EC 81 mg tablet Take 1 Tab by mouth daily. Take with food.   carvedilol (COREG) 25 mg tablet Take 1 tablet by mouth twice daily. Take with food.   cilostazol(+) (PLETAL) 50 mg tablet Take 50 mg by mouth twice daily. Take on an empty stomach at least 30 minutes before or 2 hours after food.   dutasteride (AVODART) 0.5 mg PO capsule Take 0.5 mg by mouth Daily.   fish oil- omega 3-DHA/EPA 300/1,000 mg capsule Take 1 Cap by mouth twice daily.   food supplemt, lactose-reduced (ENSURE COMPLETE) 0.05-1.5 gram-kcal/mL liqd Take 1 Can by mouth three times daily with meals.   hydrALAZINE (APRESOLINE) 100 mg tablet TAKE 1 TABLET BY MOUTH THREE TIMES DAILY   levothyroxine (SYNTHROID) 100 mcg tablet Take 1 tablet by mouth daily.   montelukast (SINGULAIR) 10 mg tablet TAKE ONE TABLET BY MOUTH ONCE DAILY         Review of Systems/Medical History      Patient summary reviewed  Nursing notes reviewed  Pertinent labs reviewed    PONV Screening: Postoperative opioids  No history of anesthetic complications  No family history of anesthetic complications      Airway - negative        Pulmonary       Current smoker        Asthma      Pneumonia (Aspiration pna and empyema) Chest x-ray 11/12: Persistent consolidation of the left lower lobe associated with partially loculated left pleural effusion. The findings remain consistent with pneumonia and either parapneumonic effusion or empyema on the left.    Patchy parenchymal opacities in the right upper lobe as well as a small right pleural effusion.      Cardiovascular         Exercise tolerance: <4 METS      Beta Blocker therapy: No      Hypertension,         Valvular problems/murmurs (s/p TAVR 2017): AS      Coronary artery disease      PVD      CHF; NYHA Classification: II      Hyperlipidemia      11/27/16 Echo:  ??? Left Ventricle: Normal size. Concentric remodeling. Normal ejection fraction.  ??? EF 65 %  ??? Right Ventricle: Normal size and ejection fraction.  ??? Aortic Valve: There is a 29 mm Sapien S3 bioprosthetic valve present. Well-seated. Leaflets not well visualized. Peak velocity of 2.1 m/s, mean gradient of 8 mmHg. No regurgitation. Unchanged compared to prior study.  ??? Pericardium: Small pericardial effusion (relatively newer compared to prior study).  ??? Valves leaflets were not  well visualized. No hemodynamic significant regurgitation or stenosis      GI/Hepatic/Renal - negative        Neuro/Psych         CVA      Musculoskeletal         Arthritis      Endocrine/Other         Hypothyroidism      Anemia (Hgb 10.2)   Physical Exam    Airway Findings      Mallampati: III      TM distance: >3 FB      Neck ROM: full      Mouth opening: good      Airway patency: adequate    Dental Findings:       Upper dentures and lower dentures    Cardiovascular Findings:       Rhythm: regular      Rate: normal      Other findings: murmur    Pulmonary Findings:    Decreased breath sounds.      Comments: Crackles throughout lung fields    Abdominal Findings: Negative      Neurological Findings:       Altered mental status (Patient appropriate but forgetful; unaware of procedure planned; daughter has been signing consents)       Diagnostic Tests Hematology:   Lab Results   Component Value Date    HGB 10.2 12/05/2016    HCT 30.7 12/05/2016    PLTCT 523 12/05/2016    WBC 9.1 12/05/2016    NEUT 72 12/05/2016    ANC 6.50 12/05/2016    ALC 1.30 12/05/2016    MONA 9 12/05/2016    AMC 0.80 12/05/2016    EOSA 4 12/05/2016    ABC 0.10 12/05/2016    MCV 91.9 12/05/2016    MCH 30.4 12/05/2016    MCHC 33.1 12/05/2016    MPV 7.0 12/05/2016    RDW 17.8 12/05/2016         General Chemistry:   Lab Results   Component Value Date    NA 134 12/05/2016    K 3.6 12/05/2016    CL 101 12/05/2016    CO2 24 12/05/2016    GAP 9 12/05/2016    BUN 12 12/05/2016    CR 1.06 12/05/2016    GLU 76 12/05/2016    CA 8.5 12/05/2016    ALBUMIN 2.3 12/05/2016    MG 1.9 11/21/2016    TOTBILI 0.4 12/05/2016    PO4 3.2 11/21/2016      Coagulation:   Lab Results   Component Value Date    PTT 29.3 08/03/2015    INR 1.2 12/05/2016         Anesthesia Plan    ASA score: 3   Plan: general  Induction method: intravenous      Informed Consent  Anesthetic plan and risks discussed with healthcare power of attorney (Daughter).  Use of blood products discussed with healthcare power of attorney (Daughter)  Blood Consent: consented      Plan discussed with: SRNA.  Comments: (Plan discussed with daughter, Iyan Flett, [place].  AQA.  Daughter agreeable to reversal of DNR for procedure.  Consent obtained over the phone with Inetta Fermo, primary RN, as witness.)

## 2016-12-05 NOTE — Progress Notes
PHYSICAL THERAPY  PROGRESS NOTE       MOBILITY:  Mobility  Progressive Mobility Level: Walk in hallway  Distance Walked (feet): 150 ft  Level of Assistance: Assist X1  Assistive Device: Walker  Time Tolerated: 0-10 minutes    SUBJECTIVE:  Subjective  Significant hospital events: PMH significant for HTN, HLD, CAD, aortic stenosis s/p TAVR, CKD, PAD, lifetime smoker, chronic diastolic heart failure. Admitted 11/20/16 with weakness and cough. Found atelectasis and L PE with chest tube placed.   Mental / Cognitive Status: Alert;Cooperative;Follows Commands (attempts to decline, easily redirected, always says no)  Persons Present: Daughter (in-law)  Pain: Patient has no complaint of pain  Pain Interventions: Patient agrees to participate in therapy  Ambulation Assist: Independent Mobility in [employer] with Device  Patient Owned Equipment: Nurse, adult  Home Situation: Lives Alone  Type of Home: Apartment (independent living)  Entry Stairs: No Stairs  In-Home Stairs: No Stairs       BED MOBILITY/TRANSFERS:  Bed Mobility/Transfers  Bed Mobility: Supine to Sit: Standby Assist  Comments: donned shoes sitting EOB prior to mobility  Transfer Type: Sit to Stand  Transfer: Assistance Level: From;Bed;Standby Assist  Transfer: Assistive Device: Nurse, adult  Transfers: Type Of Assistance: For Balance;For Strength Deficit;Verbal Cues;For Safety Considerations  Other Transfer Type: Stand Pivot  Other Transfer: Assistance Level: To;Bed Side Chair;Standby Assist  Other Transfer: Assistive Device: Nurse, adult  Other Transfer: Type Of Assistance: For Strength Deficit;For Balance;Verbal Cues;For Safety Considerations  End Of Activity Status: Up in Chair;Nursing Notified;Instructed Patient to Request Assist with Mobility;Instructed Patient to Use Call Light (TABS alarm in place)       GAIT:  Gait  Gait Distance: 150 feet (ambulation distance limited due to generalized weakness and fatigue) Gait: Assistance Level: Minimal Assist (contact guard assist)  Gait: Assistive Device: Roller Walker  Gait: Descriptors: Pace: Slow;Forward trunk flexion  Comments: utilized walker this date- patient slightly weaker as team attempting to establish nutritional status. improved ability to pick up RLE during gait- less cues required  Activity Limited By: Complaint of Fatigue;Weakness    ASSESSMENT/PROGRESS:  Assessment/Progress  Impaired Mobility Due To: Decreased Strength;Impaired Balance;Decreased Activity Tolerance;Deconditioning  Assessment/Progress: Should Improve w/ Continued PT  Comments: continues to improve. anticipate rehab stay will assist with function and patient routine. patient continues to demonstrate balance and functional strength deficits that would warrant a rehab stay due to prolonged hospitalization with ongoing medical complications.        AM-PAC 6 Clicks Basic Mobility Inpatient  Turning from your back to your side while in a flat bed without using bed rails: None  Moving from lying on your back to sitting on the side of a flatbed without using bedrails : None  Moving to and from a bed to a chair (including a wheelchair): A Little  Standing up from a chair using your arms (e.g. wheelchair, or bedside chair): A Little  To walk in hospital room: A Little  Climbing 3-5 steps with a railing: A Little  Raw Score: 20  Standardized (T-scale) Score: 43.99  Basic Mobility CMS 0-100%: 33.32  CMS G Code Modifier for Basic Mobility: CJ    GOALS:  Goals  Goal Formulation: With Patient/Family  Time For Goal Achievement: 5 days  Pt Will Go Supine To/From Sit: w/ Stand By Assist, Ongoing  Pt Will Transfer Bed/Chair: w/ Stand By Assist, Ongoing  Pt Will Transfer Sit to Stand: w/ Stand By Assist, Ongoing  Pt Will Ambulate: Greater than 200 Feet, w/  Walker, w/ Stand By Assist, Ongoing    PLAN:  Plan   Treatment Interventions: Mobility Training;Strengthening;Balance Activities;Endurance Training Plan Frequency: 5 Days per Week  PT Plan for Next Visit: Progress ambulation as tolerated, transfer to chair to promote increased time out of bed, higher level balance, LE strengthening    RECOMMENDATIONS:  PT Discharge Recommendations  PT Discharge Recommendations: Inpatient Setting;Recommend Physical Medicine and Rehabilitation Consult to address most appropriate level of rehabilitation placement (as patient with generalized deconditioning and lives alone)  Equipment Recommendations: Nurse, adult    Therapist: Hospital doctor Aidan Caloca  Date: 12/05/2016

## 2016-12-05 NOTE — Case Management (ED)
Case Management Progress Note    NAME:Javier Gutierrez                          MRN: [id number]              DOB:20-Sep-1940          AGE: 76 y.o.  ADMISSION DATE: 11/20/2016             DAYS ADMITTED: LOS: 15 days      Todays Date: 12/05/2016    Plan  DCP Ongoing: SW [facility] pending bed availability and medical stability.    Interventions  ? Support   Support: Pt/Family Updates re:POC or DC Plan    SW reviewed EMR and discussed POC with team, earliest anticipated DC Friday pending placement of EPG by GI and pt's ability tolerate TF.    ID following- pt no longer on IV Vanc, continuing IV Zosyn  ? Info or Referral      ? Discharge Planning   Discharge Planning: Inpatient Rehabilitation    [facility]  ? Medication Needs      ? Financial      ? Legal      ? Other        Disposition  ? Expected Discharge Date    Expected Discharge Date: 12/08/16  ? Transportation   Does the patient need discharge transport arranged?: No  Transportation Name, Phone and Availability #1: Wynona CanesChristine Jhaveri--daughter--[phone]  Does the patient use Medicaid Transportation?: No  ? Next Level of Care (Acute Psych discharges only)      ? Discharge Disposition                                          Durable Medical Equipment     No service has been selected for the patient.      [facility]     No service has been selected for the patient.      [facility]     No service has been selected for the patient.      [facility]     No service has been selected for the patient.          Lianne Cure-katie , LMSW *[phone]

## 2016-12-05 NOTE — Case Management (ED)
Case Management Progress Note    NAME:Javier Gutierrez                          MRN: [id number]              DOB:March 27, 1940          AGE: 76 y.o.  ADMISSION DATE: 11/20/2016             DAYS ADMITTED: LOS: 15 days      Todays Date: 12/05/2016    Plan   Continue inpatient admission   Per MPG huddle, patient failed swallow study - GI consulted for PEG tube placement.  Anticipate   DC to [facility] Friday vs weekend pending timing of PEG placement and patient being able to tolerate tube feeds.     Interventions  ? Support   Support: Pt/Family Updates re:POC or DC Plan  ? Info or Referral      ? Discharge Planning   Discharge Planning: Inpatient Rehabilitation  ? Medication Needs      ? Financial      ? Legal      ? Other        Disposition  ? Expected Discharge Date    Expected Discharge Date: 12/08/16  ? Transportation   Does the patient need discharge transport arranged?: No  Transportation Name, Phone and Availability #1: Wynona CanesChristine Weant--daughter--[phone]  Does the patient use Medicaid Transportation?: No     Nancee LiterJessica  BSN, RN Case Sports administratorManager  [facility]  Phone: [phone] * Pager: [phone]

## 2016-12-05 NOTE — Progress Notes
Name:  Javier Gutierrez                                               MRN:  [id number]   Admission Date:  11/20/2016  Today's Date: 12/05/16  LOS: 15      ASSESSMENT AND PLAN     76 year old man with history of hypertension, hyperlipidemia, coronary artery disease, aortic stenosis, chronic kidney disease, peripheral artery disease, and lifetime smoker who presents with concern for weakness and cough.  Found to have complicated empyema.  Chest tube was inserted.  Pulmonary treated the empyema with TPA/dornase.  CT surgery decided there is no surgical indication for decortication.  There was concern on the CT for possible obstruction pneumonia and infection disease recommended to have bronchoscopy.  ???  Complicated parapneumonic effusion  - CT Chest 11/20/16: Consolidation of the majority of the left mid and lower lung, most compatible with pneumonia and partially loculated pleural effusion. Underlying neoplastic endobronchial lesion possible.  - LDH 293, white count 1400, pH of pleural fluid 7.29, cytology negative for malignant cells  - pleural fluid culture STREPTOCOCCUS INTERMEDIUS sensitive to PCN  - urine strep and leg Ag are negative, RVP negative  - s/p chest tube placement 11/20/16  - repeat CT 11/24/16 scan showed development of scattered opacities throughout the right lung that is most suggestive of pneumonia (new), Small, partially loculated left-sided hydropneumothorax with a pleural drain in place. ???The pleural fluid has mildly decreased and the pleural gas has developed since the October 30  - because of now new right sided ground glass opacities as well as up trending WBC, added Levaquin 11/24/16 on top of zosyn and vanc  - pulmonary following: Resume intrapleural t-PA/Dornase for 6 more doses, keep CT to -20cm wall suction when not clamped with t-pa or dornase, continue aggressive Pulmonary hygiene  - Nov 5: ID consulted recommended continue current antibiotics. - Nov 5 : CTS consulted. No surgical intervention   - Nov 5: CT showed Slight??? increase??? in??? mild??? patchy??? groundglass??? opacities??? throughout??? the??? right??? lung. Unchanged??? small??? right??? pleural??? effusion.  - DC'd levaquin (complete 5 days course)  - Repeated chest CT by pulmonary on November 7 reviewed.  No new changes  - Bronchoscopy with BAL done on 11/9, cultures are negative  - Pulmonology pulled chest tube on 11/11    Improving leucocytosis/ Sepsis   - Echo was basically negative for any vegetation. Altho the study was difficult, in the setting of low clinical suspicion, no need for more testing.   - procalcitonin slightly elevated     Anemia and thrombocytosis  - due to chronic inflammation as per iron panel.   - transfused 1 unit pRBCs on 11/9  - Hgb 10.2 today  - cont PPI  ???  CAD  - Continue PTA Aspirin/COreg  - no chest pain  ???  Recent Mechanical fall  - CT head negative  - CT C spine was suspicious for possible lytic lesions to the c spine  - Consulted PT/OT   ???  HLD/HTN  - He is of his simvastatin and fenofibrate due to reported muscle weakness since last Thursday Nov 1st  - Continue PTA coreg and hydralazine  ???  Hypothyroidism  - TSH/Free T4 normal  - Continue PTA synthroid  ???  Aortic Valve stenosis   - S/P TAVR in 2017  ???  Euvolemic hypoNa  - asymptomatic   -  Urine osmol 227 and Na 70  - serum osmo 268  - TSH WNL. am cortisol 15, WNL  - regular diet     Anorexia, mild protein cal malnutrition?   - multiple etiology   - could be related to antibiotics, current illness, anemia, disliking hospital food  - patient pulled corpak    Dysphagia  - concern for aspiration pneumonia and empyema  - reviewed Speech therapy note from initial consultation, however patient continues to cough during feedings  - patient pulled out corpak last night and refused replacement  - consulted Speech to re-evaluated with video swallow study, recommend NPO  - GI consult for PEG placement    PT/OT recommneds inpatient setting FEN  - lytes stable  - no IVF    Ppx  - SCDs, will hold chemical ppx for PEG placement    Disp  - continue inpatient care    SUBJECTIVE   Patient seen and examined.  He has no concerns today, daughter-in-law is at bedside today.  He currently has no concerns.  No fever or chills.  No significant pain.  No dyspnea.  He is willing to get PEG     OBJECTIVE     VITAL SIGNS   BP: 156/81 (11/14 0758)  Temp: 36.4 ???C (97.6 ???F) (11/14 0758)  Pulse: 81 (11/14 0758)  Respirations: 16 PER MINUTE (11/14 0758)  SpO2: 94 % (11/14 0758)  O2 Delivery: None (Room Air) (11/14 1610)       PHYSICAL EXAM   General: ???Alert and response to question appropriately. ???VS as above. No acute distress . ??????  Lungs: ???No use of accessory muscles. ???Clear to auscultation bilaterally without wheezes, rales, or rhonchi.  Decrease BS at the bases more over the L lung  Cardiovascular: RRR, ???No edema in BLE.   Abdomen: ???BS+, soft, ND, ???no guarding or rigidity. ???Nontender to palpation.  Skin: Skin color normal, no obvious evidence of rashes.   Neurologic: ???Alerted and oriented x 3. ???  Psych:??????Alerted and oriented, calm, normal affect    LAB REVIEW     Recent Labs      12/04/16   0559  12/05/16   0553   WBC  10.9  9.1   HGB  9.0*  10.2*   HCT  26.7*  30.7*   PLTCT  493*  523*   MCV  89.4  91.9     Recent Labs      12/04/16   0559  12/05/16   0553   NA  132*  134*   K  3.9  3.6   CL  101  101   CO2  24  24   BUN  12  12   CR  1.00  1.06   GAP  7  9   GFR  >60  >60   ALBUMIN  2.1*  2.3*   TOTBILI  0.4  0.4   AST  13  19   ALT  4*  7       MEDICATIONS   Scheduled Meds:    aspirin EC tablet 81 mg 81 mg Oral QDAY   carvedilol (COREG) tablet 12.5 mg 12.5 mg Oral BID   cilostazol(+) (PLETAL) tablet 50 mg 50 mg Oral BID before meals   dutasteride (AVODART) capsule 0.5 mg 0.5 mg Oral QDAY   enoxaparin (LOVENOX) syringe 40 mg 40 mg Subcutaneous QDAY(21)   levothyroxine (SYNTHROID) tablet 100 mcg 100 mcg Oral  QDAY montelukast (SINGULAIR) tablet 10 mg 10 mg Oral QDAY   pantoprazole (PROTONIX) injection 40 mg 40 mg Intravenous QDAY   piperacillin/tazobactam  (ZOSYN) 4.5 g/100 mL iso-osmotic IVPB 4.5 g Intravenous Q6H*   polyethylene glycol 3350 (MIRALAX) packet 17 g 1 packet Oral BID   senna/docusate (SENOKOT-S) tablet 1 tablet 1 tablet Oral BID   [START ON 12/06/2016] vancomycin (VANCOCIN) 1,000 mg in dextrose 5% (D5W) 250 mL IVPB (Vial2Bag) 1 g Intravenous Q24H*   Continuous Infusions:    PRN and Respiratory Meds:albuterol Q4H PRN, pancrelipase 20,000 Units/ sodium bicarbonate 650 mg(#) PRN (On Call from Rx), [DISCONTINUED] vancomycin   IVPB Q12H* **AND** vancomycin, pharmacy to manage Per Pharmacy      RADIOLOGY AND OTHER DIAGNOSTIC PROCEDURES REVIEW     iology and Othe  Pertinent radiology reviewed.

## 2016-12-06 ENCOUNTER — Encounter: Admit: 2016-12-06 | Discharge: 2016-12-06 | Payer: MEDICARE

## 2016-12-06 DIAGNOSIS — E039 Hypothyroidism, unspecified: ICD-10-CM

## 2016-12-06 DIAGNOSIS — Z8679 Personal history of other diseases of the circulatory system: ICD-10-CM

## 2016-12-06 DIAGNOSIS — M1990 Unspecified osteoarthritis, unspecified site: ICD-10-CM

## 2016-12-06 DIAGNOSIS — E785 Hyperlipidemia, unspecified: ICD-10-CM

## 2016-12-06 DIAGNOSIS — R54 Age-related physical debility: ICD-10-CM

## 2016-12-06 DIAGNOSIS — J45909 Unspecified asthma, uncomplicated: ICD-10-CM

## 2016-12-06 DIAGNOSIS — I350 Nonrheumatic aortic (valve) stenosis: ICD-10-CM

## 2016-12-06 DIAGNOSIS — I5023 Acute on chronic systolic (congestive) heart failure: ICD-10-CM

## 2016-12-06 DIAGNOSIS — R05 Cough: Secondary | ICD-10-CM

## 2016-12-06 DIAGNOSIS — I429 Cardiomyopathy, unspecified: ICD-10-CM

## 2016-12-06 DIAGNOSIS — Z720 Tobacco use: ICD-10-CM

## 2016-12-06 DIAGNOSIS — I7090 Unspecified atherosclerosis: ICD-10-CM

## 2016-12-06 DIAGNOSIS — I2510 Atherosclerotic heart disease of native coronary artery without angina pectoris: ICD-10-CM

## 2016-12-06 DIAGNOSIS — K635 Polyp of colon: ICD-10-CM

## 2016-12-06 DIAGNOSIS — I10 Essential (primary) hypertension: ICD-10-CM

## 2016-12-06 DIAGNOSIS — I6529 Occlusion and stenosis of unspecified carotid artery: ICD-10-CM

## 2016-12-06 MED ORDER — ONDANSETRON HCL (PF) 4 MG/2 ML IJ SOLN
INTRAVENOUS | 0 refills | Status: DC
Start: 2016-12-06 — End: 2016-12-06
  Administered 2016-12-06: 17:00:00 4 mg via INTRAVENOUS

## 2016-12-06 MED ORDER — ASPIRIN 81 MG PO CHEW
81 mg | Freq: Every day | ORAL | 0 refills | Status: DC
Start: 2016-12-06 — End: 2016-12-07
  Administered 2016-12-07: 03:00:00 81 mg via ORAL

## 2016-12-06 MED ORDER — LIDOCAINE (PF) 200 MG/10 ML (2 %) IJ SYRG
0 refills | Status: DC
Start: 2016-12-06 — End: 2016-12-06
  Administered 2016-12-06: 17:00:00 60 mg via INTRAVENOUS

## 2016-12-06 MED ORDER — PROPOFOL INJ 10 MG/ML IV VIAL
0 refills | Status: DC
Start: 2016-12-06 — End: 2016-12-06
  Administered 2016-12-06: 17:00:00 80 mg via INTRAVENOUS

## 2016-12-06 MED ORDER — SENNA/DOCUSATE(#) 8.8/50MG/10ML PO SOLN
10 mL | Freq: Two times a day (BID) | ORAL | 0 refills | Status: DC
Start: 2016-12-06 — End: 2016-12-07

## 2016-12-06 MED ORDER — LACTATED RINGERS IV SOLP
500 mL | INTRAVENOUS | 0 refills | Status: DC
Start: 2016-12-06 — End: 2016-12-07

## 2016-12-06 MED ORDER — FENTANYL CITRATE (PF) 50 MCG/ML IJ SOLN
50 ug | INTRAVENOUS | 0 refills | Status: CN | PRN
Start: 2016-12-06 — End: ?

## 2016-12-06 MED ORDER — SODIUM CHLORIDE 0.9 % IV SOLP
1000 mL | Freq: Once | INTRAVENOUS | 0 refills | Status: CP
Start: 2016-12-06 — End: ?
  Administered 2016-12-06: 15:00:00 1000 mL via INTRAVENOUS

## 2016-12-06 MED ORDER — FENTANYL CITRATE (PF) 50 MCG/ML IJ SOLN
0 refills | Status: DC
Start: 2016-12-06 — End: 2016-12-06
  Administered 2016-12-06: 17:00:00 25 ug via INTRAVENOUS
  Administered 2016-12-06: 17:00:00 50 ug via INTRAVENOUS

## 2016-12-06 MED ORDER — DEXTRAN 70-HYPROMELLOSE (PF) 0.1-0.3 % OP DPET
0 refills | Status: DC
Start: 2016-12-06 — End: 2016-12-06
  Administered 2016-12-06: 17:00:00 2 [drp] via OPHTHALMIC

## 2016-12-06 MED ORDER — SUCCINYLCHOLINE CHLORIDE 20 MG/ML IJ SOLN
INTRAVENOUS | 0 refills | Status: DC
Start: 2016-12-06 — End: 2016-12-06
  Administered 2016-12-06: 17:00:00 100 mg via INTRAVENOUS

## 2016-12-06 MED ORDER — DEXAMETHASONE SODIUM PHOSPHATE 4 MG/ML IJ SOLN
INTRAVENOUS | 0 refills | Status: DC
Start: 2016-12-06 — End: 2016-12-06
  Administered 2016-12-06: 17:00:00 4 mg via INTRAVENOUS

## 2016-12-06 NOTE — Procedures
Inpatient Endoscopic Procedures were performed in the [facility] lab  Sedation provided by Anesthesia - see Anesthesia notes for detailed description of medications and dosages    Procedure report with complete description of findings and recommendations was generated in [employer] - this can be viewed under the Results Review tab    Brief Summary of Key findings:  EGD  Performed  Normal upper Endoscopy  24Fr PEG tube placed by pull technique  External bumper at 2 cm  No immediate post procedural complication  EBL 5 cc    Summary Recommendations:  The patient is being followed by the GI Inpatient Consult Service  OK to use for medications in 4 hours  GI fellow will check PEG in AM and ok for use for tube feeds  Monitor for fever, bleeding, pain

## 2016-12-06 NOTE — Progress Notes
Physical Medicine & Rehabilitation Progress Note     Patient name: Javier Gutierrez  Patient age: 76 y.o.  Today's Date:  12/06/2016  Admission Date: 11/20/2016  LOS: 16 days                     Assessment/Plan:     Principal Problem:    Parapneumonic effusion  Active Problems:    Tobacco use disorder    CAD (coronary artery disease)    Chronic diastolic (congestive) heart failure (HCC)    Pneumonia    Sepsis (HCC)    Acute respiratory failure with hypoxia (HCC)    Hyponatremia      Javier Gutierrez is a 76 y.o. male admitted to [facility] on 11/20/2016 with the following issues:    Debility 2/2 sepsis, complicated para pneumonic effusion leading to acute respiratory failure.   ???  Impairments: weakness  Activity Limitations:  dressing - upper, dressing - lower, transfers and ambulation  Participation Restrictions: unable to return home safely    This is a follow up visit from initial consultation performed on 11/2. Last seen by rehab 11/13.     Hospital Course: [name] is a 76 y/o male w/ Hx of diastolic CHF, AS s/p TAVR, CAD, CKD, HTN, who presented to [facility] ED on 10/30 with weakness and SOB. He was found on chest CT to have Lt sided lung consolidation concerning for PNA w/ effusion and a suspicious lumg mass. Pulmonolgy was consulted, placed pigtail CT to suction, fluid analysis consistent with complicated pneumonic effusion, CT placed to wall suction (cytology pending). Empiric abx therapies initiated. The patient's stay has been complicated by acute respiratory failure with new oxygen requirement, sepsis, shock. Repeat chest CT on 11/3 with small Lt hydropneumothorax, persistent Lt consolidation, new Rt lung infection followed by interval chest CT on 11/5 w/ slight inc patchy ground glass opacities Rt lung. CT removed. The patient is receiving a peg tube today and SLP is following for further assessment and recommendations. IV zosyn persists for CAP with complicated left parapneumonic pleural effusion, ID following.     Post acute placement recommendations: The patient has medical complexity and currently has therapeutic goals to consider an acute inpatient rehab admission. He would need to tolerate tube feeds prior to consideration for admission. He will also need to have goals in 2 of 3 therapeutic disciplines that persist at the time of discharge.     Prior to consideration for an inpatient rehabilitation admission complete the following:   *Antibiotics The patient will need a plan/endpoint/follow-up delineated for IV antibiotics and surgical drains, and potential plans for ongoing antibiotics after discharge.  *Persistent therapeutic goals The patient would need to have clear, therapeutic goals in at least 2 out of 3 therapeutic disciplines including PT, OT, and ST that persist at the time of discharge. This will need to be determined prior to consideration for acute inpatient rehab.   *Dysphagia The patient is at risk for or has significant dysphagia.  The primary team is currently addressing adequate nutritional access, and the patient will need to make sure the patient is tolerating appropriate tubefeeding prior to admission to acute inpatient rehabilitation.    Other recommendations:   Impaired gait/mobility:  PTA pt was independent at community level without assistive device   Currently requiring SBA bed mobility & transfers with RW, 375ft ambulation with HHA at minA level.  Will benefit from continued work with PT to address mobility deficits  Impaired ADL:  PTA pt was independent  Currently requiring minA LE dressing, maxA toileting, modA functional transfer  Will benefit from ongoing OT to address functional deficits    Dysarthria  Impaired cognition  Pt would benefit from ST language and cognitive evaluation and treatment.      Dysphagia:  Peg placed today. TF not started yet.   The patient will benefit from ongoing ST to advance swallow.    Pain: Patient may benefit from pre-treatment of pain prior to therapies and possibly scheduling Tylenol 650mg -1g TID while awake for improved pain and function.     The patient was discussed with Dr. Gelene Mink.      Thank you for this consultation.  Please call our consult pager with questions or concerns.    Gibson Ramp, MD   [phone]      Subjective     Javier Gutierrez is a 76 y.o. male.  Patient reports he is anxious about his swlaaow study today stating he wants to eat. He denies N&V, SON, CP.       Self-Care/ADLs:  Independent   Mobility: independent at community level w/ assistive device     Home Environment:  Home Situation: Lives Alone (11/23/2016 11:00 AM)  Patient Owned Equipment: Roller Walker (11/23/2016 11:00 AM)  Type of Home: Apartment (independent living) (11/23/2016 11:55 AM)  Entry Stairs: No Stairs (11/23/2016 11:00 AM)  In-Home Stairs: No Stairs (11/23/2016 11:00 AM)  Comments: Patient defers prior level of function information to be obtained from daughter. Daughter reports patient was living at home alone in an independent living facility where he was fully indepenent up until about a week ago when he experienced a fall which resulted in patient being on the ground for an estimated 15 hours. Since then has used a roller walker for mobility. Daughter reports progressive weakness for about the past week leading up to hospitalization. No oxygen used at baseline. (11/21/2016 12:50 PM)  Bathroom Equipment: Grab Bars in Shower (11/23/2016 11:55 AM)    Current Level Of Function:   PT Gait:Gait Distance: 150 feet Gait: Assistance Level: Minimal Assist (contact guard assist) Gait: Assistive Device: Roller Walker  Bed Mobility/Transfers  Bed Mobility: Supine to Sit: Standby Assist  Bed Mobility: Sit to Supine: Moderate Assist, Assist with B LE, Assist with Trunk  Comments: donned shoes sitting EOB prior to mobility  Transfer Type: Sit to Stand  Transfer: Assistance Level: From, Bed, Standby Assist Transfer: Assistive Device: Nurse, adult  Transfers: Type Of Assistance: For Balance, For Strength Deficit, Verbal Cues, For Safety Considerations  Other Transfer Type: Stand Pivot  Other Transfer: Assistance Level: To, Bed Side Chair, Standby Assist  Other Transfer: Assistive Device: Nurse, adult  Other Transfer: Type Of Assistance: For Strength Deficit, For Balance, Verbal Cues, For Safety Considerations  End Of Activity Status: Up in Chair, Nursing Notified, Instructed Patient to Request Assist with Mobility, Instructed Patient to Use Call Light (TABS alarm in place)  Comments: Patient returned supine in bed with bed alarm activated and call light within reach.      OT ADL's  Where Assessed: Standing at Sink, Edge of Bed  Eating Deficits: NPO, but hand to mouth WNL  Grooming Assist: Maximum Assist  Grooming Deficits: Denture Care (maximum verbal cueing to initiate and thoroughly complete or)  LE Dressing Assist: Stand By Assist  LE Dressing Deficits: Don/Doff R Shoe, Don/Doff L Shoe, Supervision/Safety, Increased Time To Complete  Toileting Assist: Maximum Assist (urinal use while sitting in the chair)  Toileting Deficits: Setup  Functional Transfer Assist: Minimal Assist  Functional Transfer Deficits: Verbal Cueing (hand placement)  Comment: Patient completes supine to sit with minimal assist with HOB elevated and max cueing for hand placement/initiation. Patient tolerates standing at the sink for 10 minutes while holding onto sink/doorway, contact guard assist. Patient denies feeling dizzy or in pain.     SLP COGNITIVE EVALUATION SUMMARY       PRAGMATICS:      BEHAVIOR:      AUDITORY COMPREHENSION:        ORIENTATION:      AUDITORY ATTENTION/WORKING MEMORY:      AUDITORY MEMORY/SUSTAINED ATTENTION:      NEW LEARNING:      SEQUENCING/ORGANIZATION:      PROBLEM SOLVING:      REASONING:        MATH/MONEY SKILLS:        VISUAL PERCEPTUAL:      SWALLOW EVALUATION SUMMARY Summary: A clinical swallow evaluation was completed this date.     Oral Stage Summary*: Pt presented ice chips via spoon, thin liquid water via cup, pudding via spoon. Pt requiring cues to accept boluses between each trial. Pt with adequate bolus procurement. Slowed oral movements and AP transfer. Intermittently wiith oral holding at onset of trials.     Pharyngeal Stage Summary*: Suspect delayed swallow initaition. No voert s/s aspiration with ice chips. Throat clear in 10% of trials with small sips thin liquids via cup. Pt did not take large drinks of thin liquids in the presnence of cues. Pt also with intermittent mutliple swallows and vocal quality changes for thin liquids. Overt cough with pudding x2 as well as mutliple swallows and vocal quality changes. Would have suspicion for silent aspiration risk due to generalized weakness, weak cough/weak vocal quality.     Swallow Recommendations*  NPO:  (pt declines alternative source of nutrition)  PO: Ice Chips Only    Plan: Continue Treatment __x/week (Comment)., Other (Comment), Patient Would Benefit from Further Speech Therapy Post Acute Hospitalization. (2-3x/wk)    Prognosis: Fair, Good           Review of Systems -     A 10 point review of systems was negative accept where reported in the HPI    Medications  Scheduled Meds:    [MAR Hold] aspirin EC tablet 81 mg 81 mg Oral QDAY   [MAR Hold] carvedilol (COREG) tablet 12.5 mg 12.5 mg Oral BID   [MAR Hold] cilostazol(+) (PLETAL) tablet 50 mg 50 mg Oral BID before meals   [MAR Hold] dutasteride (AVODART) capsule 0.5 mg 0.5 mg Oral QDAY   [MAR Hold] enoxaparin (LOVENOX) syringe 40 mg 40 mg Subcutaneous QDAY(21)   [MAR Hold] levothyroxine (SYNTHROID) tablet 100 mcg 100 mcg Oral QDAY   [MAR Hold] montelukast (SINGULAIR) tablet 10 mg 10 mg Oral QDAY   [MAR Hold] pantoprazole (PROTONIX) injection 40 mg 40 mg Intravenous QDAY   piperacillin/tazobactam  (ZOSYN) 4.5 g/100 mL iso-osmotic IVPB 4.5 g Intravenous Q6H* [MAR Hold] polyethylene glycol 3350 (MIRALAX) packet 17 g 1 packet Oral BID   [MAR Hold] senna/docusate (SENOKOT-S) tablet 1 tablet 1 tablet Oral BID   Continuous Infusions:  ??? lactated ringers infusion       PRN and Respiratory Meds:[MAR Hold] albuterol Q4H PRN, [MAR Hold] pancrelipase 20,000 Units/ sodium bicarbonate 650 mg(#) PRN (On Call from Rx)      Objective  Vital Signs: Last Filed                 Vital Signs: 24 Hour Range   BP: 145/92 (11/15 1014)  Temp: 36.6 ???C (97.9 ???F) (11/15 1014)  Pulse: 69 (11/15 1014)  Respirations: 18 PER MINUTE (11/15 1014)  SpO2: 95 % (11/15 1014)  O2 Delivery: None (Room Air) (11/15 1014) BP: (130-145)/(61-92)   Temp:  [36.5 ???C (97.7 ???F)-36.6 ???C (97.9 ???F)]   Pulse:  [69-95]   Respirations:  [16 PER MINUTE-18 PER MINUTE]   SpO2:  [93 %-97 %]   O2 Delivery: None (Room Air)     Vitals:    11/20/16 0518 11/27/16 1316   Weight: 68 kg (150 lb) 68 kg (150 lb)       Intake/Output Summary:  (Last 24 hours)    Intake/Output Summary (Last 24 hours) at 12/06/16 1035  Last data filed at 12/06/16 0900   Gross per 24 hour   Intake              210 ml   Output              845 ml   Net             -635 ml      Stool Occurrence: 0       Oral Diet Order: NPO  Last Bowel Movement Date: 12/04/16             Physical Exam     BP: 145/92 (11/15 1014)  Temp: 36.6 ???C (97.9 ???F) (11/15 1014)  Pulse: 69 (11/15 1014)  Respirations: 18 PER MINUTE (11/15 1014)  SpO2: 95 % (11/15 1014)  O2 Delivery: None (Room Air) (11/15 1014)  Body mass index is 21.52 kg/m???.     Constitutional:Fatigued. NAD  Head: Normocephalic and atraumatic.   Eyes: Conjunctivae and EOM are normal.   Cardiovascular: Extremities well perfused.   Pulmonary/Chest: Effort normal. No respiratory distress.   Abdominal:  PEG  Skin: Skin is warm and dry.   Extremities: no peripheral edema   Psychiatric: Mood and affect normal.   MSK: 5/5 strength to bilateral EF, EE, FF, HF, KE, AD, AP  Neuro: Cranial Nerves Face is symmetric, EOMI   Finger to Nose Normal without ataxia/dysmetria but slow   Upper Extremity Tone Normal   Lower Extremity Tone Normal   Upper Extremity Sensation Intact to light touch bilaterally   Lower Extremity Sensation Intact to light touch bilaterally   Clonus Negative Bilaterally   Memory/Concentration  A&Oxself and location, not date. Severe dysarthria. Able to follow commands with repetition

## 2016-12-06 NOTE — Case Management (ED)
Case Management Progress Note    NAME:Javier Gutierrez                          MRN: [id number]              DOB:[birth year]          AGE: 76 y.o.  ADMISSION DATE: 11/20/2016             DAYS ADMITTED: LOS: 16 days      Todays Date: 12/06/2016    Plan  DCP Ongoing: [facility] availability and medical stability.    Interventions  ? Support   Support: Pt/Family Updates re:POC or DC Plan    SW reviewed EMR and discussed POC with team, plan for GI to place PEG today.   If pt tolerate TF via PEG, anticipated Dc Saturday.   ID following-need finalized IV Zosyn plan prior to DC.  ? Info or Referral      ? Discharge Planning   Discharge Planning: Inpatient Rehabilitation    SW discussed EDD of Saturday pending pt's ability to tolerate TF via PEG.   [facility], review EMR Friday, and address with SW.   inpatient consult team plans to eval pt today.  ? Medication Needs      ? Financial      ? Legal      ? Other        Disposition  ? Expected Discharge Date    Expected Discharge Date: 12/08/16  ? Transportation   Does the patient need discharge transport arranged?: No  Transportation Name, Phone and Availability #1: Wynona CanesChristine Zeigler--daughter--[phone]  Does the patient use Medicaid Transportation?: No  ? Next Level of Care (Acute Psych discharges only)      ? Discharge Disposition                                          Durable Medical Equipment     No service has been selected for the patient.      [place] [facility]     No service has been selected for the patient.      [facility]     No service has been selected for the patient.      [facility]     No service has been selected for the patient.          Lianne Cure-Katie , LMSW *[phone]

## 2016-12-06 NOTE — Progress Notes
Per GI resident, OK to use PEG tube for medications and flushes 4 hours post procedure.

## 2016-12-06 NOTE — Progress Notes
SPEECH-LANGUAGE PATHOLOGY  NO TREATMENT NOTE     Chart reviewed. Pt off floor for peg tube placement. SLP will continue to follow.     Therapist: Margit BandaMaureen , MS,CCC-SLP [phone]  Date: 12/06/2016

## 2016-12-06 NOTE — Patient Education
Medication Education    Letta Moynahanhomas Ferrante accepted counseling and was receptive.  he needs reinforcement.    The following medications were discussed:  Zosyn    Where indicated, the patient was provided with additional medication and/or disease-state information.  All patient questions were answered and patient acknowledged understanding of the medications, side effects and other pertinent medication information.    Follow up should occur daily.    Continue to address: indications    Lesly DukesMolly Meagher, RN

## 2016-12-06 NOTE — Progress Notes
Text paged [facility] to clarify NPO orders and PM medications. See new orders.

## 2016-12-06 NOTE — Patient Education
Medication Education    Letta Moynahanhomas Ishibashi accepted counseling and was receptive.  he verbalized understanding.    The following medications were discussed:  Carvedilol, enoxaparin, senna/docusate (not given), and polyethylene glycol 3350 (not given).  Where indicated, the patient was provided with additional medication and/or disease-state information.  All patient questions were answered and patient acknowledged understanding of the medications, side effects and other pertinent medication information.    Follow up should occur daily.    Continue to address: indications.     Pt is a Q2 turn. Heels checked, still blanching. Elevate feet on pillows.     Pt also educated on NPO diet w/ ice chips, VTE prophylaxis, turning, and I/O monitoring.     Leonard Schwartzachel , RN

## 2016-12-06 NOTE — Progress Notes
Name:  Javier Gutierrez                                               MRN:  [id number]   Admission Date:  11/20/2016  Today's Date: 12/06/16  LOS: 16      ASSESSMENT AND PLAN     76 year old man with history of hypertension, hyperlipidemia, coronary artery disease, aortic stenosis, chronic kidney disease, peripheral artery disease, and lifetime smoker who presents with concern for weakness and cough.  Found to have complicated empyema.  Chest tube was inserted.  Pulmonary treated the empyema with TPA/dornase.  CT surgery decided there is no surgical indication for decortication.  There was concern on the CT for possible obstruction pneumonia and infection disease recommended to have bronchoscopy.  ???  Complicated parapneumonic effusion  - CT Chest 11/20/16: Consolidation of the majority of the left mid and lower lung, most compatible with pneumonia and partially loculated pleural effusion. Underlying neoplastic endobronchial lesion possible.  - LDH 293, white count 1400, pH of pleural fluid 7.29, cytology negative for malignant cells  - pleural fluid culture STREPTOCOCCUS INTERMEDIUS sensitive to PCN  - urine strep and leg Ag are negative, RVP negative  - s/p chest tube placement 11/20/16  - repeat CT 11/24/16 scan showed development of scattered opacities throughout the right lung that is most suggestive of pneumonia (new), Small, partially loculated left-sided hydropneumothorax with a pleural drain in place. ???The pleural fluid has mildly decreased and the pleural gas has developed since the October 30  - pulmonary following: Resume intrapleural t-PA/Dornase for 6 more doses, keep CT to -20cm wall suction when not clamped with t-pa or dornase, continue aggressive Pulmonary hygiene  - Nov 5: ID consulted recommended continue current antibiotics.   - Nov 5 : CTS consulted. No surgical intervention   - Nov 5: CT showed Slight??? increase??? in??? mild??? patchy??? groundglass??? opacities??? throughout??? the??? right??? lung. Unchanged??? small??? right??? pleural??? effusion.  - DC'd levaquin (complete 5 days course), DC'd Vancomycin, continue Zosyn for now per ID recommendation  - Repeated chest CT by pulmonary on November 7 reviewed.  No new changes  - Bronchoscopy with BAL done on 11/9, cultures are negative  - Pulmonology pulled chest tube on 11/11    Improving leucocytosis/ Sepsis   - Echo was basically negative for any vegetation. Altho the study was difficult, in the setting of low clinical suspicion, no need for more testing.   - procalcitonin slightly elevated     Anemia and thrombocytosis  - due to chronic inflammation as per iron panel.   - transfused 1 unit pRBCs on 11/9  - Hgb now stable  - cont PPI  ???  CAD  - Continue PTA Aspirin/COreg  - no chest pain  ???  Recent Mechanical fall  - CT head negative  - CT C spine was suspicious for possible lytic lesions to the c spine  - Consulted PT/OT   ???  HLD/HTN  - He is of his simvastatin and fenofibrate due to reported muscle weakness since last Thursday Nov 1st  - Continue PTA coreg and hydralazine  ???  Hypothyroidism  - TSH/Free T4 normal  - Continue PTA synthroid  ???  Aortic Valve stenosis   - S/P TAVR in 2017  ???  Euvolemic hypoNa  - asymptomatic   -  Urine osmol 227 and  Na 70  - serum osmo 268  - TSH WNL. am cortisol 15, WNL  - regular diet     Anorexia, mild protein cal malnutrition?   - multiple etiology   - could be related to antibiotics, current illness, anemia, disliking hospital food  - patient pulled corpak    Dysphagia  - concern for aspiration pneumonia and empyema  - reviewed Speech therapy note from initial consultation, however patient continues to cough during feedings  - patient pulled out corpak last night and refused replacement  - consulted Speech to re-evaluated with video swallow study, recommend NPO  - GI consult for PEG placement, will do today    PT/OT recommneds inpatient setting    FEN  - lytes stable - will give 1L of NS today as patient has been NPO for 2 days and appears a little dehydrated    Ppx  - SCDs, will hold chemical ppx for PEG placement    Disp  - continue inpatient care    SUBJECTIVE   Patient seen and examined.  He has no concerns today, daughter is at bedside today.  They are agreeable to PEG placement.  No fever or chills.  No significant pain.  No dyspnea.      OBJECTIVE     VITAL SIGNS   BP: 135/72 (11/15 0728)  Temp: 36.6 ???C (97.9 ???F) (11/15 1610)  Pulse: 72 (11/15 0728)  Respirations: 18 PER MINUTE (11/15 0728)  SpO2: 93 % (11/15 0728)  O2 Delivery: None (Room Air) (11/15 9604)       PHYSICAL EXAM   General: ???Alert and response to question appropriately. ???VS as above. No acute distress . ??????  Throat: MM dry  Lungs: ???No use of accessory muscles. ???Clear to auscultation bilaterally without wheezes, rales, or rhonchi.  Decrease BS at the bases more over the L lung  Cardiovascular: RRR, ???No edema in BLE.   Abdomen: ???BS+, soft, ND, ???no guarding or rigidity. ???Nontender to palpation.  Skin: Skin color normal, no obvious evidence of rashes.   Neurologic: ???Alerted and oriented x 3. ???  Psych:??????Alerted and oriented, calm, normal affect    LAB REVIEW     Recent Labs      12/04/16   0559  12/05/16   0553  12/05/16   1539   WBC  10.9  9.1   --    HGB  9.0*  10.2*   --    HCT  26.7*  30.7*   --    PLTCT  493*  523*   --    MCV  89.4  91.9   --    INR   --    --   1.2     Recent Labs      12/04/16   0559  12/05/16   0553   NA  132*  134*   K  3.9  3.6   CL  101  101   CO2  24  24   BUN  12  12   CR  1.00  1.06   GAP  7  9   GFR  >60  >60   ALBUMIN  2.1*  2.3*   TOTBILI  0.4  0.4   AST  13  19   ALT  4*  7       MEDICATIONS   Scheduled Meds:    aspirin EC tablet 81 mg 81 mg Oral QDAY   carvedilol (COREG) tablet 12.5 mg 12.5 mg Oral  BID   cilostazol(+) (PLETAL) tablet 50 mg 50 mg Oral BID before meals   dutasteride (AVODART) capsule 0.5 mg 0.5 mg Oral QDAY enoxaparin (LOVENOX) syringe 40 mg 40 mg Subcutaneous QDAY(21)   levothyroxine (SYNTHROID) tablet 100 mcg 100 mcg Oral QDAY   montelukast (SINGULAIR) tablet 10 mg 10 mg Oral QDAY   pantoprazole (PROTONIX) injection 40 mg 40 mg Intravenous QDAY   piperacillin/tazobactam  (ZOSYN) 4.5 g/100 mL iso-osmotic IVPB 4.5 g Intravenous Q6H*   polyethylene glycol 3350 (MIRALAX) packet 17 g 1 packet Oral BID   senna/docusate (SENOKOT-S) tablet 1 tablet 1 tablet Oral BID   Continuous Infusions:    PRN and Respiratory Meds:albuterol Q4H PRN, pancrelipase 20,000 Units/ sodium bicarbonate 650 mg(#) PRN (On Call from Rx)      RADIOLOGY AND OTHER DIAGNOSTIC PROCEDURES REVIEW     iology and Othe  Pertinent radiology reviewed.

## 2016-12-06 NOTE — Progress Notes
OCCUPATIONAL THERAPY  PROGRESS NOTE    Patient Name: Javier Gutierrez                   Room/Bed: [facility] RM/[facility] BD  Admitting Diagnosis:       Mobility  Progressive Mobility Level: Walk in room  Level of Assistance: Assist X1  Assistive Device: None (will benefit from using walker)  Time Tolerated: 11-30 minutes  Activity Limited By: Weakness;Fatigue    Subjective  Pertinent Dx per Physician: PMH significant for HTN, HLD, CAD, aortic stenosis s/p TAVR, CKD, PAD, lifetime smoker, chronic diastolic heart failure. Admitted 11/20/16 with weakness and cough. Found atelectasis and L PE with chest tube placed.   Precautions: Falls  Pain / Complaints: Patient has no c/o pain;Patient agrees to participate in therapy    Objective  Persons Present: Daughter;Son    Home Living  Type of Home: Apartment (independent living)  Home Layout: One Level  Financial risk analyst / Tub: Medical sales representative: Standard  Bathroom Equipment: Engineer, materials in [employer] Equipment: Environmental consultant (did not use prior to fall a week ago)    Prior Function  Level Of Independence: Independent with ADLs and functional transfers;Needed assistance with homemaking  Lives With: Alone  Receives Help From: Family  Other Function Comments: ILF does not provide meals. Pt's daughter states he eats out for breakfast and lunch and snacks the rest of the day. He has someone who comes to clean his apartment.     ADL's  Where Assessed: Edge of Bed;Standing at [employer]  Eating Deficits: NPO, but hand to mouth WNL  Grooming Assist: Moderate Assist  Grooming Deficits: Verbal Cueing;Steadying (mouthwash use)  LE Dressing Assist: Stand By Assist  LE Dressing Deficits: Increased Time To Complete;Don/Doff R Shoe;Don/Doff L Shoe  Toileting Assist: Maximum Assist  Toileting Deficits: Clothing Management Up  Functional Transfer Assist: Minimal Assist  Functional Transfer Deficits: Verbal Cueing (hand placement prior to sitting to control descent into chai) Comment: With cueing and encouragement, patient able to transition self to sit edge of bed with HOB elevated and use of bed rail.     Activity Tolerance  Endurance: 3/5 Tolerates 25-30 Minutes Exercise w/Multiple Rests  Sitting Balance: 5/5 Moves/Returns Trunkal Midpoint in All Planes > 2 Inches    Cognition  Overall Cognitive Status: Impaired  Comprehension: Hard of Hearing  Problem Solving: Direction Following Assist;Cueing to Sequence Task  Attention: Awake/Alert  Cognition Comment: Delayed processing, decrease problem solving, poor initiation to tasks.     UE Strength / Tone  Comment: Provided level 3 thera-band and instructed on BUE strengthening exercises. Patient performs 2 sets x5 reps. Patient needs encouragement and ongoing education to achieve proper technique for quality muscle strengthening.     Assessment  Assessment: Decreased Endurance;Decreased Self-Care Trans;Decreased High-Level ADLs  Prognosis: Good;w/Cont OT s/p Acute Discharge  Comments: Patient continues to benefit from ongoing therapies to increase strength and function.     AM-PAC 6 Clicks Daily Activity Inpatient  Putting on and taking off regular lower body clothes?: A Little  Bathing (Including washing, rinsing, drying): A Little  Toileting, which includes using toilet, bedpan, or urinal: A Little  Putting on and taking off regular upper body clothing: A Little  Taking care of personal grooming such as brushing teeth: A Little  Eating meals?: Total (PEG)  Daily Activity Raw Score: 16  Standardized (t-scale) score: 35.96  CMS 0-100% Score: 53.32  CMS G Code Modifier: CK  Plan  OT Frequency: 5x/week  OT Plan for Next Visit: endurance building, standing ADL's      ADL Goals  Patient Will Perform Grooming: Standing at Sink;w/ Stand By Assist  Patient Will Perform LE Dressing: w/ Stand By Assist  Patient Will Perform Toileting: w/ Stand By Assist    Functional Transfer Goals  Pt Will Perform All Functional Transfers: w/ Stand By Assist OT Discharge Recommendations  OT Discharge Recommendations: Inpatient Setting, To address deficits, maximize function and improve safety.     Recommend ongoing assistance for: Transfers, Bed mobility, Ambulation, Stairs, Safety concerns, In and out of house        Therapist: Theotis Burrow, [place] [id number]  Date: 12/06/2016

## 2016-12-06 NOTE — Progress Notes
PHYSICAL THERAPY  NOTE       Patient was unavailable for physical therapy. Patient off the floor for PEG tube placement. Physical therapy will continue to follow and provide intervention as indicated. Will follow up tomorrow.     Therapist: Barnett HatterAmber Leovanni Bjorkman  Date: 12/06/2016

## 2016-12-06 NOTE — Progress Notes
EGD/Upper EUS/ERCP/Antegrade Enteroscopy  Post Upper Endoscopy Instructions      -You may have a sore throat after the procedure for 2-3 days.  Try sucrets or lozenges to help ease the pain.  If it continues please contact us.    -If you feel feverish, have a temperature of 101 degrees or higher, persistent nausea and vomiting, abdominal pain or dark stools; please notify your nurse or GI physician.    -You may have abdominal cramping following the procedure this can be relieved by belching or passing air.    -If you have redness or swelling at the IV site, place a warm, wet washcloth over the affected areas for 15 minutes, 3-4 times a day until the redness subsides.  If symptoms continue for 2-3 days, contact your regular physician.    - If you have bleeding from your mouth, over 2 tablespoons and increasing, please notify your physician.  A small amount of bleeding is normal if a biopsy or polyps were taken.  If you are vomiting blood you need to seek immediate medical attention.    - You may resume all your routine medications, if medications need to be held your physician and/or nurse will notify you post procedure.    SPECIFIC INSTRUCTIONS  INPATIENTS:  Ask for help when you get up in your room, as you may still be drowsy from your sedation.    OUTPATIENTS:  A. Because of sedation and lack of coordination, UNTIL TOMORROW, DO NOT:  1. Operate any motorized vehicle - this includes driving.  2. Sign any legal documents or conduct important business matters.  3. Use any dangerous machinery (chain saw, lawnmower, etc.).  4. Drink any alcoholic beverages.  Should you have any questions or concerns after your procedure please call [phone] M-F 8am-5:00 pm. After 5:00 pm, holidays or weekends call [phone] and ask for the GI Doctor on call.

## 2016-12-06 NOTE — Progress Notes
RT Adult Assessment Note    NAME:Javier Gutierrez             MRN: [id number]             DOB:03-31-1940          AGE: 76 y.o.  ADMISSION DATE: 11/20/2016             DAYS ADMITTED: LOS: 15 days    RT Treatment Plan:  Protocol Plan: Medications  Albuterol: MDI PRN    Protocol Plan: Procedures  Vibrating PEP Therapy: Q4h While Awake  PAP: Place a nursing order for "IS Q1h While Awake" for any of Lung Expansion indicators  Oxygen/Humidity: O2 to keep SpO2 > 92%  Monitoring: Pulse oximetry BID & PRN    Additional Comments:  Impressions of the patient: sleeping no distress noted      Vital Signs:  Pulse: Pulse: 95  RR: Respirations: 18 PER MINUTE  SpO2: SpO2: 95 %  O2 Device: $$ O2 Device: Standby  Liter Flow:    O2%: O2 Percent: 21 %  Breath Sounds: All Breath Sounds: Decreased;Coarse crackles  Respiratory Effort: Respiratory Effort: Non-Labored

## 2016-12-06 NOTE — Anesthesia Post-Procedure Evaluation
Post-Anesthesia Evaluation    Name: Javier Gutierrez      MRN: [id number]     DOB: 1940-09-22     Age: 76 y.o.     Sex: male   __________________________________________________________________________     Procedure Date: 12/06/2016  Procedure: Procedure(s):  ESOPHAGOGASTRODUODENOSCOPY for PEG placement      Surgeon: Surgeon(s):  Buckles, Vinnie Levelaniel C, MD  Zenaida NieceAllen, James Eric, MD    Post-Anesthesia Vitals  BP: 109/64 (11/15 1157)  Temp: 36.2 C (97.2 F) (11/15 1139)  Pulse: 75 (11/15 1157)  Respirations: 25 PER MINUTE (11/15 1157)  SpO2: 94 % (11/15 1157)  O2 Delivery: None (Room Air) (11/15 1152)  SpO2 Pulse: 75 (11/15 1157)      Post Anesthesia Evaluation Note    Evaluation location: Pre/Post  Patient participation: recovered; patient participated in evaluation  Level of consciousness: alert  Pain management: adequate    Hydration: normovolemia  Airway patency: adequate    Perioperative Events  Perioperative events:  no       Post-op nausea and vomiting: no PONV    Postoperative Status  Cardiovascular status: hemodynamically stable  Respiratory status: spontaneous ventilation (Coughing and clearing secretions (like at baseline))  Follow-up needed: none        Perioperative Events  Perioperative Event: No  Emergency Case Activation: No

## 2016-12-07 ENCOUNTER — Inpatient Hospital Stay: Admit: 2016-12-07 | Discharge: 2016-12-07 | Payer: MEDICARE

## 2016-12-07 DIAGNOSIS — R05 Cough: Secondary | ICD-10-CM

## 2016-12-07 LAB — CBC
Lab: 10 10*3/uL — ABNORMAL LOW (ref 4.5–11.0)
Lab: 2.6 M/UL — ABNORMAL LOW (ref >60)

## 2016-12-07 LAB — BASIC METABOLIC PANEL: Lab: 135 MMOL/L — ABNORMAL LOW (ref >60)

## 2016-12-07 MED ORDER — POLYETHYLENE GLYCOL 3350 17 GRAM PO PWPK
1 | Freq: Two times a day (BID) | GASTROSTOMY | 0 refills | Status: DC
Start: 2016-12-07 — End: 2016-12-10
  Administered 2016-12-07 – 2016-12-09 (×3): 17 g via GASTROSTOMY

## 2016-12-07 MED ORDER — LEVOTHYROXINE 100 MCG PO TAB
100 ug | Freq: Every day | GASTROSTOMY | 0 refills | Status: DC
Start: 2016-12-07 — End: 2016-12-12
  Administered 2016-12-08 – 2016-12-12 (×5): 100 ug via GASTROSTOMY

## 2016-12-07 MED ORDER — SODIUM CHLORIDE 0.9 % IV SOLP
2000 mL | Freq: Once | INTRAVENOUS | 0 refills | Status: CP
Start: 2016-12-07 — End: ?
  Administered 2016-12-07: 19:00:00 2000 mL via INTRAVENOUS

## 2016-12-07 MED ORDER — ASPIRIN 81 MG PO CHEW
81 mg | Freq: Every day | GASTROSTOMY | 0 refills | Status: DC
Start: 2016-12-07 — End: 2016-12-11
  Administered 2016-12-07 – 2016-12-11 (×3): 81 mg via GASTROSTOMY

## 2016-12-07 MED ORDER — CEFPODOXIME 200 MG PO TAB
200 mg | Freq: Two times a day (BID) | ORAL | 0 refills | Status: DC
Start: 2016-12-07 — End: 2016-12-11
  Administered 2016-12-08 – 2016-12-11 (×8): 200 mg via ORAL

## 2016-12-07 MED ORDER — SENNA/DOCUSATE(#) 8.8/50MG/10ML PO SOLN
10 mL | Freq: Two times a day (BID) | GASTROSTOMY | 0 refills | Status: DC
Start: 2016-12-07 — End: 2016-12-10
  Administered 2016-12-07 – 2016-12-09 (×3): 10 mL via GASTROSTOMY

## 2016-12-07 MED ORDER — CILOSTAZOL 100 MG PO TAB
50 mg | Freq: Two times a day (BID) | NASOGASTRIC | 0 refills | Status: DC
Start: 2016-12-07 — End: 2016-12-11
  Administered 2016-12-07 – 2016-12-11 (×8): 50 mg via NASOGASTRIC

## 2016-12-07 MED ORDER — DEXTROMETHORPHAN-GUAIFENESIN 10-100 MG/5 ML PO SYRP
10 mL | ORAL | 0 refills | Status: DC | PRN
Start: 2016-12-07 — End: 2016-12-11

## 2016-12-07 MED ORDER — ACETAMINOPHEN 325 MG PO TAB
650 mg | Freq: Once | NASOGASTRIC | 0 refills | Status: CP
Start: 2016-12-07 — End: ?
  Administered 2016-12-08: 04:00:00 650 mg via NASOGASTRIC

## 2016-12-07 MED ORDER — CARVEDILOL 12.5 MG PO TAB
12.5 mg | Freq: Two times a day (BID) | GASTROSTOMY | 0 refills | Status: DC
Start: 2016-12-07 — End: 2016-12-12
  Administered 2016-12-08 – 2016-12-12 (×10): 12.5 mg via GASTROSTOMY

## 2016-12-07 MED ORDER — MONTELUKAST 10 MG PO TAB
10 mg | Freq: Every day | GASTROSTOMY | 0 refills | Status: DC
Start: 2016-12-07 — End: 2016-12-12
  Administered 2016-12-08 – 2016-12-12 (×5): 10 mg via GASTROSTOMY

## 2016-12-07 MED ORDER — DOXAZOSIN 1 MG PO TAB
1 mg | Freq: Every day | GASTROSTOMY | 0 refills | Status: DC
Start: 2016-12-07 — End: 2016-12-12
  Administered 2016-12-08 – 2016-12-12 (×5): 1 mg via GASTROSTOMY

## 2016-12-07 NOTE — Progress Notes
Patient was seen and examined at bedside.  He denies any abdominal pain, nausea/vomiting, melena, hematochezia.    Upon exam he is in no acute distress, vitals are stable, he is wearing an abdominal binder over the PEG. I examined the PEG site which appeared well, without any warmth, erythema or tenderness, there was some crusted blood around the tube, bumper was at 3 cm. Abdomen is soft, ND, NTTP, BS present.    Recommendations:  - PEG tube was able to be used   - please follow nutritional recommendations regarding tube feedings  - Please continue aspiration precautions while and further little bit after tube feedings to prevent any reflux and massive aspiration  - Please check for residuals to be able to assess for safe volume of tube feedings    We will sign off, please page us with any questions or concerns.    Patient was discussed with attending on service, Dr. Francisco CapuchinMolloy.    Renea EeShadi , MD  Gastroenterology & Hepatology Fellow  Pager [phone]

## 2016-12-07 NOTE — Patient Education
Medication Education    Letta Moynahanhomas Hauss accepted counseling and was receptive. he needs reinforcement.    The following medications were discussed:  Zosyn, Coreg, Aspirin, Senna, Miralax    Where indicated, the patient was provided with additional medication and/or disease-state information. All patient questions were answered and patient acknowledged understanding of the medications, side effects and other pertinent medication information.    Follow up should occur daily.    Continue to address: indications    Lesly DukesMolly Meagher, RN

## 2016-12-07 NOTE — Progress Notes
SPEECH-LANGUAGE PATHOLOGY  BRIEF COGNITIVE ASSESSMENT     EVALUATION SUMMARY  Summary: Brief cognitive assessment completed this date at request of rehab services. 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 Pt reports living alone previously and was independent with meal preparation, financial management, and medication management. Question reduced insight to cognitive deficits at this time. Will continue to follow, please see details below.     RECOMMENDATIONS  1. NPO with PEG for nutrition/medication  2. Excellent oral care  3. Ice chip protocol (7-10 per hour, only as tolerated)  4. Ongoing SLP at next level of care to address dysphagia and cognition  5. Consistent supervision recommended upon discharge as anticipate patient's impaired memory & attention will potentially impact their safety    Overall Cognitive Severity Level: Mod  Prognosis: Fair  Plan: Patient Would Benefit from Further Speech Therapy Post Acute Hospitalization., Continue Treatment  2-3x/Week    PRAGMATICS   Comments*: Pragmatics judged to be WNL during evaluation.    BEHAVIOR  Comments*: Some increased processing time and need for repetition of information appreciated.    AUDITORY COMPREHENSION  Comments*: Auditory comprehension judged to [facility], further assessment required.    ORIENTATION  Comments*: Pt oriented to perosn, place, time, situation. Question reduced insight into deficits as pt reports no cognitive deficits at this time.    AUDITORY ATTENTION/WORKING MEMORY  Comments*: Moderate deficits. Digits forward: up to 7, digits reversed: up to 3. Immediate three word recall: 3/3 with no need for repetition of words. Delayed three word recall: 0/3 independently, 2/3 with multiple choice cue (after three minutes); should be noted pt had no recollection of task. Counting by intervals: 2/14    AUDITORY MEMORY/SUSTAINED ATTENTION Comments*: Not assessed this date.     NEW LEARNING:  Comments*: Not assessed this date.     SEQUENCING/ORGANIZATION  Comments*: Not assessed this date.     PROBLEM SOLVING  Comments*: Pt demonstrates good verbal problem solving skills for 2/2 questions.    REASONING  Comments*: Not assessed this date.     MATH/MONEY SKILLS  Comments*: Moderate deficits: 1/2    VISUAL PERCEPTUAL  Comments*: Not assessed this date.     Objective*  Relevant Med Background: 76 year old man with history of hypertension, hyperlipidemia, coronary artery disease, aortic stenosis, chronic kidney disease, peripheral artery disease, and lifetime smoker who presents with concern for weakness and cough.  Found to have complicated empyema.  Chest tube was inserted.  Pulmonary treated the empyema with TPA/dornase.  CT surgery decided there is no surgical indication for decortication.  There was concern on the CT for possible obstruction pneumonia and infection disease recommended to have bronchoscopy.  Lives With: Alone  Receives Help From: None Needed, Other (Comment) (Pt reports none needed)  Psychosocial Status: Willing and Cooperative to Participate  Persons Present: None    Subjective*  Pain: Patient has no complaint of pain  Pain Level Current*: No pain  Trach Presence: No  Feeding Tube Present During Eval:  (PEG)    Education*  Persons Educated: Patient  Barriers To Learning: Cognitive Deficits  Interventions: Haematologist Educated, Repetition of Instructions  Teaching Methods: Verbal, Demonstration  Topics: Memory  Patient Response: Verbalized Understanding  Goal Formulation: With Pt/Family    Cognitive Goals*  Goal : Pt will participate in ongoing cognitive evaluation.   Goal : Pt will complete pharyngeal strengthening exercises with 70% accuracy and minimal cues.    Goal : Pt will tolerate  ice chip protocol with less than 10% overt s/s aspiration and free from respiratory status changes. Therapist: Virgina Evener M.A. CCC-L/SLP Voalte: [phone] Weekend On Call Pager: [phone]  Date: 12/07/2016

## 2016-12-07 NOTE — Progress Notes
OCCUPATIONAL THERAPY  PROGRESS NOTE    Patient Name: Javier Gutierrez                   Room/Bed: ZO1096/04  Admitting Diagnosis:       Mobility  Progressive Mobility Level: Walk in hallway  Distance Walked (feet): 150 ft  Level of Assistance: Assist X1 (Contact guard assist)  Assistive Device: Walker  Time Tolerated: 11-30 minutes  Activity Limited By: Weakness    Subjective  Pertinent Dx per Physician: PMH significant for HTN, HLD, CAD, aortic stenosis s/p TAVR, CKD, PAD, lifetime smoker, chronic diastolic heart failure. Admitted 11/20/16 with weakness and cough. Found atelectasis and L PE with chest tube placed.   Precautions: Falls  Pain Level Current: No pain    Objective  Psychosocial Status: Willing and Cooperative to Participate    Home Living  Type of Home: Apartment (independent living)  Home Layout: One Level  Financial risk analyst / Tub: Medical sales representative: Standard  Bathroom Equipment: Engineer, materials in [employer] Equipment: Environmental consultant (did not use prior to fall a week ago)    Prior Function  Level Of Independence: Independent with ADLs and functional transfers;Needed assistance with homemaking  Lives With: Alone  Receives Help From: Family  Other Function Comments: ILF does not provide meals. Pt's daughter states he eats out for breakfast and lunch and snacks the rest of the day. He has someone who comes to clean his apartment.    ADL's  Eating Deficits: NPO, but hand to mouth WNL (PEG tube)  LE Dressing Assist: Stand By Assist  LE Dressing Deficits: Increased Time To Complete;Don/Doff R Shoe;Don/Doff L Shoe  Toileting Assist: Maximum Assist  Toileting Deficits: Clothing Management Up  Functional Transfer Assist: Minimal Assist  Functional Transfer Deficits: Verbal Cueing (hand placement prior to sitting to control descent into chai)  Comment: In addition to above ADL's, patient ambulates 150 ft. using roller walker, contact guard assist. Patient has major loss of balance with poor initiation for self recovery when walking around the bed towards the door, without the walker (walker placed by the door for retrieval). Patient requires physical assistance to correct balance. Patient did not seem concerned and states I'm OK! Patient shuffles feet and demonstrates poor standing posture with cervical flexion. Provided frequent cues to keep head upright/neck in extension.    Activity Tolerance  Endurance: 3/5 Tolerates 25-30 Minutes Exercise w/Multiple Rests  Sitting Balance: 5/5 Moves/Returns Trunkal Midpoint in All Planes > 2 Inches    Cognition  Overall Cognitive Status: Impaired  Comprehension: Hard of Hearing  Problem Solving: Direction Following Assist;Cueing to Sequence Task  Attention: Awake/Alert  Cognition Comment: Delayed processing, decrease problem solving.    Assessment  Assessment: Decreased Endurance;Decreased Self-Care Trans;Decreased High-Level ADLs  Prognosis: Good;w/Cont OT s/p Acute Discharge  Comments: Patient continues to benefit from ongoing therapies to increase strength and function. Patient is not functioning at his baseline and continues to require assistance for ADL's/functional mobility.    AM-PAC 6 Clicks Daily Activity Inpatient  Putting on and taking off regular lower body clothes?: A Little  Bathing (Including washing, rinsing, drying): A Little  Toileting, which includes using toilet, bedpan, or urinal: A Little  Putting on and taking off regular upper body clothing: A Little  Taking care of personal grooming such as brushing teeth: A Little  Eating meals?: Total (PEG)  Daily Activity Raw Score: 16  Standardized (t-scale) score: 35.96  CMS 0-100% Score: 53.32  CMS G  Code Modifier: CK    Plan  OT Frequency: 5x/week  OT Plan for Next Visit: endurance building, standing ADL's,  bring handout for theraband       ADL Goals  Patient Will Perform Grooming: Standing at Sink;w/ Stand By Assist  Patient Will Perform LE Dressing: w/ Stand By Assist Patient Will Perform Toileting: w/ Stand By Assist    Functional Transfer Goals  Pt Will Perform All Functional Transfers: w/ Stand By Assist        OT Discharge Recommendations  OT Discharge Recommendations: Inpatient Setting, To address deficits, maximize function and improve safety.     Recommend ongoing assistance for: Transfers, Bed mobility, Ambulation, Stairs, Safety concerns, In and out of house        Therapist: Theotis Burrow, [place] [id number]  Date: 12/07/2016

## 2016-12-07 NOTE — Progress Notes
RT Adult Assessment Note    NAME:Javier Gutierrez             MRN: [id number]             DOB:05-15-1940          AGE: 76 y.o.  ADMISSION DATE: 11/20/2016             DAYS ADMITTED: LOS: 17 days    RT Treatment Plan:  Protocol Plan: Medications  Albuterol: MDI PRN    Protocol Plan: Procedures  Vibrating PEP Therapy: Discontinued (PT may do on own)  PAP: Place a nursing order for "IS Q1h While Awake" for any of Lung Expansion indicators  Oxygen/Humidity: Discontinued  Monitoring: Discontinued    Additional Comments:  Impressions of the patient: alert, sitting in chair      Vital Signs:  Pulse: Pulse: 76  RR: Respirations: 16 PER MINUTE  SpO2: SpO2: 93 %  O2 Device:    Liter Flow:    O2%: O2 Percent: 21 %  Breath Sounds: All Breath Sounds: Decreased  Respiratory Effort: Respiratory Effort: Non-Labored

## 2016-12-07 NOTE — Progress Notes
Name:  Javier Gutierrez                                               MRN:  [id number]   Admission Date:  11/20/2016  Today's Date: 12/07/16  LOS: 17      ASSESSMENT AND PLAN     76 year old man with history of hypertension, hyperlipidemia, coronary artery disease, aortic stenosis, chronic kidney disease, peripheral artery disease, and lifetime smoker who presents with concern for weakness and cough.  Found to have complicated empyema.  Chest tube was inserted.  Pulmonary treated the empyema with TPA/dornase.  CT surgery decided there is no surgical indication for decortication.  There was concern on the CT for possible obstruction pneumonia and infection disease recommended to have bronchoscopy.  ???  Complicated parapneumonic effusion  - CT Chest 11/20/16: Consolidation of the majority of the left mid and lower lung, most compatible with pneumonia and partially loculated pleural effusion. Underlying neoplastic endobronchial lesion possible.  - LDH 293, white count 1400, pH of pleural fluid 7.29, cytology negative for malignant cells  - pleural fluid culture STREPTOCOCCUS INTERMEDIUS sensitive to PCN  - urine strep and leg Ag are negative, RVP negative  - s/p chest tube placement 11/20/16  - repeat CT 11/24/16 scan showed development of scattered opacities throughout the right lung that is most suggestive of pneumonia (new), Small, partially loculated left-sided hydropneumothorax with a pleural drain in place. ???The pleural fluid has mildly decreased and the pleural gas has developed since the October 30  - pulmonary following: Resume intrapleural t-PA/Dornase for 6 more doses, keep CT to -20cm wall suction when not clamped with t-pa or dornase, continue aggressive Pulmonary hygiene  - Nov 5: ID consulted recommended continue current antibiotics.   - Nov 5 : CTS consulted. No surgical intervention   - Nov 5: CT showed Slight??? increase??? in??? mild??? patchy??? groundglass??? opacities??? throughout??? the??? right??? lung. Unchanged??? small??? right??? pleural??? effusion.  - DC'd levaquin (complete 5 days course), DC'd Vancomycin, continue Zosyn for now per ID recommendation  - Repeated chest CT by pulmonary on November 7 reviewed.  No new changes  - Bronchoscopy with BAL done on 11/9, cultures are negative  - Pulmonology pulled chest tube on 11/11    Improving leucocytosis/ Sepsis   - Echo was basically negative for any vegetation. Altho the study was difficult, in the setting of low clinical suspicion, no need for more testing.   - procalcitonin slightly elevated     Anemia and thrombocytosis  - due to chronic inflammation as per iron panel.   - transfused 1 unit pRBCs on 11/9  - Hgb lower this am, however had 1L NS yesterday, no signs of bleeding at this time, will monitor  - cont PPI  ???  CAD  - Continue PTA Aspirin/COreg  - no chest pain  ???  Recent Mechanical fall  - CT head negative  - CT C spine was suspicious for possible lytic lesions to the c spine  - Consulted PT/OT   ???  HLD/HTN  - He is of his simvastatin and fenofibrate due to reported muscle weakness since last Thursday Nov 1st  - Continue PTA coreg and hydralazine  ???  Hypothyroidism  - TSH/Free T4 normal  - Continue PTA synthroid  ???  Aortic Valve stenosis   - S/P TAVR in 2017  ???  Euvolemic hypoNa  - asymptomatic   -  Urine osmol 227 and Na 70  - serum osmo 268  - TSH WNL. am cortisol 15, WNL  - regular diet     Anorexia, mild protein cal malnutrition?   - multiple etiology   - could be related to antibiotics, current illness, anemia, disliking hospital food  - patient pulled corpak    Dysphagia  - concern for aspiration pneumonia and empyema  - reviewed Speech therapy note from initial consultation, however patient continues to cough during feedings  - patient pulled out corpak last night and refused replacement  - consulted Speech to re-evaluated with video swallow study, recommend NPO - GI consulted and placed PEG on 11/15, will start tube feeds today    PT/OT recommneds inpatient setting- Inpatient Rehab consulted, planning for transfer to Rehab once stable on tube feeds    FEN  - lytes stable  - start tube feeds today  - 2L NS at 247ml/hr      Ppx  - SCDs, resume lovenox    Disp  - continue inpatient care    SUBJECTIVE   Patient seen and examined.  He has no concerns today, he is a little confused.  He does not feel thirsty, he denies any pain.  No dyspnea.  No fever or chills     OBJECTIVE     VITAL SIGNS   BP: 98/51 (11/16 1004)  Temp: 36.9 ???C (98.4 ???F) (11/16 0729)  Pulse: 90 (11/16 1004)  Respirations: 16 PER MINUTE (11/16 0847)  SpO2: 93 % (11/16 0847)  O2 Delivery: None (Room Air) [phone] 0729)  SpO2 Pulse: 70 (11/15 1219)       PHYSICAL EXAM   General: ???Alert and response to question appropriately. ???VS as above. No acute distress . ??????  Throat: MM dry  Lungs: ???No use of accessory muscles. ???Clear to auscultation bilaterally without wheezes, rales, or rhonchi.  Decrease BS at the bases more over the L lung  Cardiovascular: RRR, ???No edema in BLE.   Abdomen: ???BS+, soft, ND, ???no guarding or rigidity. ???Nontender to palpation. PEG in place  Skin: Skin color normal, no obvious evidence of rashes.   Neurologic: ???Alerted and oriented x 3. ???  Psych:??????Alerted and oriented, calm, normal affect    LAB REVIEW     Recent Labs      12/05/16   0553  12/05/16   1539  12/07/16   0558   WBC  9.1   --   10.5   HGB  10.2*   --   8.0*   HCT  30.7*   --   24.4*   PLTCT  523*   --   474*   MCV  91.9   --   92.1   INR   --   1.2   --      Recent Labs      12/05/16   0553  12/07/16   0558   NA  134*  135*   K  3.6  3.5   CL  101  104   CO2  24  25   BUN  12  28*   CR  1.06  1.10   GAP  9  6   GFR  >60  >60   ALBUMIN  2.3*   --    TOTBILI  0.4   --    AST  19   --    ALT  7   --  MEDICATIONS   Scheduled Meds:    aspirin chewable tablet 81 mg 81 mg PEG Tube QDAY carvedilol (COREG) tablet 12.5 mg 12.5 mg PEG Tube BID   cilostazol(+) (PLETAL) tablet 50 mg 50 mg Per NG tube BID before meals   dutasteride (AVODART) capsule 0.5 mg 0.5 mg Oral QDAY   enoxaparin (LOVENOX) syringe 40 mg 40 mg Subcutaneous QDAY(21)   [START ON 12/08/2016] levothyroxine (SYNTHROID) tablet 100 mcg 100 mcg PEG Tube QDAY   montelukast (SINGULAIR) tablet 10 mg 10 mg PEG Tube QDAY   pantoprazole (PROTONIX) injection 40 mg 40 mg Intravenous QDAY   piperacillin/tazobactam  (ZOSYN) 4.5 g/100 mL iso-osmotic IVPB 4.5 g Intravenous Q6H*   polyethylene glycol 3350 (MIRALAX) packet 17 g 1 packet PEG Tube BID   senna/docusate (SENOKOT-S) solution 10 mL 10 mL PEG Tube BID   Continuous Infusions:  ??? lactated ringers infusion       PRN and Respiratory Meds:albuterol Q4H PRN, pancrelipase 20,000 Units/ sodium bicarbonate 650 mg(#) PRN (On Call from Rx)      RADIOLOGY AND OTHER DIAGNOSTIC PROCEDURES REVIEW     iology and Othe  Pertinent radiology reviewed.

## 2016-12-07 NOTE — Patient Education
Medication Education    Javier Gutierrez accepted counseling and was receptive.  he needs reinforcement.    The following medications were discussed:  Aspirin, carvedilol, cilostazol, enoxaparin, montelukast, piperacillin/tazobactam, polyethylene glycol 3350 (refused), and senna/docusate (refused).   Where indicated, the patient was provided with additional medication and/or disease-state information.  All patient questions were answered and patient acknowledged understanding of the medications, side effects and other pertinent medication information.    Follow up should occur daily.    Continue to address: indications.     Pt is a Q2 turn. Heels checked.     Pt also educated on abdominal binder and PEG management.     Leonard Schwartzachel , RN

## 2016-12-07 NOTE — Progress Notes
CLINICAL NUTRITION                                                        Clinical Nutrition Follow-Up Summary     NAME:Javier Gutierrez             MRN: [id number]             DOB:December 15, 1940          AGE: 76 y.o.  ADMISSION DATE: 11/20/2016             DAYS ADMITTED: LOS: 17 days    Nutrition Assessment of Patient:  Malnutrition Assessment: Does not meet criteria  Current Oral Intake: NPO  Estimated Calorie Needs: 1700-1840 kcal (25-27 kcal/kg present wt 68kg)  Estimated Protein Needs: 82-88g (1.2-1.3g/kg present wt 68kg)  Oral Diet Order: NPO  Oral Supplement: Boost Plus, TID    Comments:  76 year old man with history of hypertension, hyperlipidemia, coronary artery disease, aortic stenosis, CKD, PAD, and lifetime smoker who presents with concern for weakness and cough. Per daughter he is reported to have a fall one week ago where he was down for 15 hours and generalized worsening fatigue over the last few weeks. Also with stage I coccyx pressure injury per RN documentation. Pt is being followed by clinical nutrition services for poor intake PTA & throughout admission. He is s/p SLP 10/30 who recommended NPO given concern for aspiration. However, cleared for regular textures 10/31 following VSS. PO intake was improving, though remained inadequate so corpak placed 11/10 (tip overlying the gastric antrum per imaging) & bolus feeds initiated. Pt pulled corpak 11/11; downgraded back to NPO 11/12 s/p SLP eval given concern for aspiration. S/p repeat VSS 11/13 which showed moderate/severe dysphagia. Pt/family decided on remaining NPO with PEG placement & ongoing SLP therapy. Pt is s/p PEG placement 11/15.     Recommendation:  Once able to use PEG, REC initiating continuous 24hr feeds of Isosource 1.5. Start at 24mL/hr & advance 20mL Q6hr to goal of 34mL/hr. Will provide 1800 kcal, 82g protein, & free water daily. At least 30mL H2O bolus Q4hr to maintain tube patency Once tolerating continuous feeds, may transition to bolus feeds: 1 carton ( ) bolused 5 times daily. Will provide 1875 kcal, 85g protein & free water daily. 75mL H2O bolus before & after each feed or additional fluids per primary                                Intervention / Plan:  Will monitor EN provision  Will monitor GI function, wt trends, labs    Nutrition Diagnosis:  Inadequate oral intake  Etiology: prior decreased appetite, dysphagia  Signs & Symptoms: son report, NPO status, s/p PEG placement for EN feeds    Goals:  Initiate nutrition  Time Frame: Within 48 Hours  Status: Not met;Ongoing      [place] Luetkemeyer, MS, RD, LD  Pager: [phone]  Phone: [phone]

## 2016-12-07 NOTE — Progress Notes
Dutasteride not compatible with PEG and pt is strict NPO. Messaged phamacy. They said there is not a different formulation compatible with PEG and will have the pharmacist talk with team tomorrow. Text paged [facility] to notify med not given.

## 2016-12-07 NOTE — Progress Notes
Pt only had 100ml of output overnight. Text paged [facility] at [phone].

## 2016-12-07 NOTE — Progress Notes
PHYSICAL THERAPY  PROGRESS NOTE       MOBILITY:  Mobility  Progressive Mobility Level: Walk in hallway  Distance Walked (feet): 150 ft  Level of Assistance: Assist X1  Time Tolerated: 11-30 minutes  Activity Limited By: Fatigue    SUBJECTIVE:  Subjective  Significant hospital events: PMH significant for HTN, HLD, CAD, aortic stenosis s/p TAVR, CKD, PAD, lifetime smoker, chronic diastolic heart failure. Admitted 11/20/16 with weakness and cough. Found atelectasis and L PE with chest tube placed.   PEG tube placed 11/15  Mental / Cognitive Status: Alert;Cooperative (minimally conversant but smiles frequently)  Pain: Patient has no complaint of pain  Pain Interventions: Patient agrees to participate in therapy (after encouragement and explanation)  Comments: Pt was in bed and agreed to walk.    Comments: PEG tube  Ambulation Assist: Independent Mobility in Community with Device  Patient Owned Equipment: Nurse, adult  Home Situation: Lives Alone  Type of Home: Apartment (independent living)  Entry Stairs: No Stairs  In-Home Stairs: No Stairs    BED MOBILITY/TRANSFERS:  Bed Mobility/Transfers  Bed Mobility: Supine to Sit: Minimal Assist;Verbal Cues;Use of Rail;Head of Bed Elevated;Assist with Trunk  Bed Mobility: Sit to Supine: Standby Assist;HOB Elevated;Use of Rail  Comments: don shoes prior to walking  Transfer Type: Sit to/from Stand  Transfer: Assistance Level: To/From;Bed (contact assist)  Transfer: Assistive Device: Nurse, adult  Transfers: Type Of Assistance: Materials engineer;For Safety Considerations  End Of Activity Status: In Bed;Instructed Patient to Request Assist with Mobility;Instructed Patient to Use Call Light  Comments: Upon returning to bed pt began to cough.  Provided him with the suction and bright red came out.  Alerted nurse immediately.    GAIT:  Gait  Gait Distance: 150 feet  Gait: Assistance Level: Management of Lines;Safety Considerations (contact assist)  Gait: Assistive Device: Roller Walker Gait: Descriptors: Pace: Slow;Decreased foot clearance RLE;Decreased heel strike RLE;Decreased step length (no loss of balance)  Comments: verbal cues for posture.  His head is significantly forward flexed.    ACTIVITY/EXERCISE:  Activity / Exercise  Comments: demonstrated cervical ROM: look forward frequently through the day    ASSESSMENT/PROGRESS:  Assessment/Progress  Comments: Pt tolerated the session well but remains deconditioned and weak.  He would benefit from an inpatient stay to work towards returning to his baseline.  AM-PAC 6 Clicks Basic Mobility Inpatient  Turning from your back to your side while in a flat bed without using bed rails: A Little  Moving from lying on your back to sitting on the side of a flatbed without using bedrails : A Little  Moving to and from a bed to a chair (including a wheelchair): A Little  Standing up from a chair using your arms (e.g. wheelchair, or bedside chair): A Little  To walk in hospital room: A Little  Climbing 3-5 steps with a railing: A Little  Raw Score: 18  Standardized (T-scale) Score: 41.05  Basic Mobility CMS 0-100%: 40.47  CMS G Code Modifier for Basic Mobility: CK    GOALS:  Goals  Goal Formulation: With Patient/Family  Time For Goal Achievement: 5 days  Pt Will Go Supine To/From Sit: w/ Stand By Assist  Pt Will Transfer Bed/Chair: w/ Stand By Assist  Pt Will Transfer Sit to Stand: w/ Stand By Assist  Pt Will Ambulate: Greater than 200 Feet, w/ Dan Humphreys, w/ Stand By Assist    PLAN:  Plan   Treatment Interventions: Mobility Training;Strengthening;Balance Activities;Endurance Training  Plan Frequency: 5 Days per  Week  PT Plan for Next Visit: *bed mobility  *progress ambulation distance - might need chair follow    RECOMMENDATIONS:  PT Discharge Recommendations  PT Discharge Recommendations: Inpatient Setting  Equipment Recommendations: Roller Walker.  Patient requires the use of a walker with wheels to complete ADL???s in the home including meal preparation, ambulation the bathroom for toileting, bathing and grooming, and safe home mobility.  Patient is unable to complete these ADL???s with a cane or crutch and can safely use the walker.     Therapist: Norva Karvonen, Physical therapist assistant  Date: 12/07/2016

## 2016-12-08 ENCOUNTER — Encounter: Admit: 2016-12-08 | Discharge: 2016-12-08 | Payer: MEDICARE

## 2016-12-08 DIAGNOSIS — I5023 Acute on chronic systolic (congestive) heart failure: ICD-10-CM

## 2016-12-08 DIAGNOSIS — Z8679 Personal history of other diseases of the circulatory system: ICD-10-CM

## 2016-12-08 DIAGNOSIS — K635 Polyp of colon: ICD-10-CM

## 2016-12-08 DIAGNOSIS — J45909 Unspecified asthma, uncomplicated: ICD-10-CM

## 2016-12-08 DIAGNOSIS — Z720 Tobacco use: ICD-10-CM

## 2016-12-08 DIAGNOSIS — I6529 Occlusion and stenosis of unspecified carotid artery: ICD-10-CM

## 2016-12-08 DIAGNOSIS — I10 Essential (primary) hypertension: ICD-10-CM

## 2016-12-08 DIAGNOSIS — I2510 Atherosclerotic heart disease of native coronary artery without angina pectoris: ICD-10-CM

## 2016-12-08 DIAGNOSIS — E039 Hypothyroidism, unspecified: ICD-10-CM

## 2016-12-08 DIAGNOSIS — M1990 Unspecified osteoarthritis, unspecified site: ICD-10-CM

## 2016-12-08 DIAGNOSIS — I429 Cardiomyopathy, unspecified: ICD-10-CM

## 2016-12-08 DIAGNOSIS — I7090 Unspecified atherosclerosis: ICD-10-CM

## 2016-12-08 DIAGNOSIS — E785 Hyperlipidemia, unspecified: ICD-10-CM

## 2016-12-08 DIAGNOSIS — I350 Nonrheumatic aortic (valve) stenosis: ICD-10-CM

## 2016-12-08 DIAGNOSIS — R54 Age-related physical debility: ICD-10-CM

## 2016-12-08 LAB — CBC: Lab: 15 K/UL — ABNORMAL HIGH (ref 4.5–11.0)

## 2016-12-08 LAB — BASIC METABOLIC PANEL: Lab: 138 MMOL/L — ABNORMAL LOW (ref 137–147)

## 2016-12-08 MED ORDER — POTASSIUM CHLORIDE 20 MEQ/15 ML PO LIQD
40 meq | Freq: Once | NASOGASTRIC | 0 refills | Status: CP
Start: 2016-12-08 — End: ?
  Administered 2016-12-08: 21:00:00 40 meq via NASOGASTRIC

## 2016-12-08 MED ORDER — PANTOPRAZOLE 40 MG IV SOLR
40 mg | Freq: Two times a day (BID) | INTRAVENOUS | 0 refills | Status: DC
Start: 2016-12-08 — End: 2016-12-12
  Administered 2016-12-09 – 2016-12-12 (×8): 40 mg via INTRAVENOUS

## 2016-12-08 MED ORDER — ACETAMINOPHEN 325 MG PO TAB
650 mg | ORAL | 0 refills | Status: DC | PRN
Start: 2016-12-08 — End: 2016-12-12
  Administered 2016-12-08 – 2016-12-09 (×3): 650 mg via ORAL

## 2016-12-08 MED ORDER — POTASSIUM CHLORIDE 20 MEQ PO TBTQ
40 meq | Freq: Once | ORAL | 0 refills | Status: DC
Start: 2016-12-08 — End: 2016-12-08

## 2016-12-08 NOTE — Patient Education
Medication Education    Javier Gutierrez accepted counseling and was receptive.  he needs reinforcement.    The following medications were discussed:  Blood transfusion   Doxazosin  miralax  Senna  protonix  Potassium  Aspirin              Where indicated, the patient was provided with additional medication and/or disease-state information.  All patient questions were answered and patient acknowledged understanding of the medications, side effects and other pertinent medication information.    Follow up should occur as needed.    Continue to address: certain medications    Javier LikensAshleigh Shann Merrick, RN

## 2016-12-08 NOTE — Progress Notes
Name:  Javier Gutierrez                                               MRN:  [id number]   Admission Date:  11/20/2016  Today's Date: 12/08/16  LOS: 18      ASSESSMENT AND PLAN     76 year old man with history of hypertension, hyperlipidemia, coronary artery disease, aortic stenosis, chronic kidney disease, peripheral artery disease, and lifetime smoker who presents with concern for weakness and cough.  Found to have complicated empyema.  Chest tube was inserted.  Pulmonary treated the empyema with TPA/dornase.  CT surgery decided there is no surgical indication for decortication.  There was concern on the CT for possible obstruction pneumonia and infection disease recommended to have bronchoscopy.  ???  Complicated parapneumonic effusion  - CT Chest 11/20/16: Consolidation of the majority of the left mid and lower lung, most compatible with pneumonia and partially loculated pleural effusion. Underlying neoplastic endobronchial lesion possible.  - LDH 293, white count 1400, pH of pleural fluid 7.29, cytology negative for malignant cells  - pleural fluid culture STREPTOCOCCUS INTERMEDIUS sensitive to PCN  - urine strep and leg Ag are negative, RVP negative  - s/p chest tube placement 11/20/16  - repeat CT 11/24/16 scan showed development of scattered opacities throughout the right lung that is most suggestive of pneumonia (new), Small, partially loculated left-sided hydropneumothorax with a pleural drain in place. ???The pleural fluid has mildly decreased and the pleural gas has developed since the October 30  - pulmonary following: Resume intrapleural t-PA/Dornase for 6 more doses, keep CT to -20cm wall suction when not clamped with t-pa or dornase, continue aggressive Pulmonary hygiene  - Nov 5: ID consulted recommended continue current antibiotics.   - Nov 5 : CTS consulted. No surgical intervention   - Nov 5: CT showed Slight??? increase??? in??? mild??? patchy??? groundglass??? opacities??? throughout??? the??? right??? lung. Unchanged??? small??? right??? pleural??? effusion.  -Status post full course of Levaquin, vancomycin, and Zosyn  - Repeated chest CT by pulmonary on November 7 reviewed.  No new changes  - Bronchoscopy with BAL done on 11/9, cultures are negative  - Pulmonology pulled chest tube on 11/11  -Plan to continue Cefpodoxime with a stop date of December 17, 2016    Improving leucocytosis/ Sepsis   - Echo was basically negative for any vegetation. Altho the study was difficult, in the setting of low clinical suspicion, no need for more testing.   -White blood cell is elevated again.  Will recheck pro-calcitonin and monitor CBC tomorrow    Anemia and thrombocytosis  - due to chronic inflammation as per iron panel.   - transfused 1 unit pRBCs on 11/9  - cont PPI  -We will give another transfusion today  -We will check Hemoccult blood  -We will hold Lovenox  ???  CAD  - Continue PTA Aspirin/COreg  - no chest pain  ???  Recent Mechanical fall  - CT head negative  - CT C spine was suspicious for possible lytic lesions to the c spine  - Consulted PT/OT   ???  HLD/HTN  - He is of his simvastatin and fenofibrate due to reported muscle weakness since last Thursday Nov 1st  - Continue PTA coreg and hydralazine  ???  Hypothyroidism  - TSH/Free T4 normal  - Continue PTA synthroid  ???  Aortic Valve stenosis   - S/P TAVR in 2017  ???  Euvolemic hypoNa  - asymptomatic   -  Urine osmol 227 and Na 70  - serum osmo 268  - TSH WNL. am cortisol 15, WNL  - regular diet     Anorexia, mild protein cal malnutrition?   - multiple etiology   - could be related to antibiotics, current illness, anemia, disliking hospital food  -feeding through the PEG tube started on November 16    Dysphagia  - concern for aspiration pneumonia and empyema  - reviewed Speech therapy note from initial consultation, however patient continues to cough during feedings  - patient pulled out corpak last night and refused replacement - consulted Speech to re-evaluated with video swallow study, recommend NPO  - GI consulted and placed PEG on 11/15, will start tube feeds November 16    PT/OT recommneds inpatient setting- Inpatient Rehab consulted, planning for transfer to Rehab once stable on tube feeds    FEN  -Replace hypokalemia with 40 mg of potassium chloride  -  tube feeds      Ppx  - SCDs, hold Lovenox    Disp  - continue inpatient care    SUBJECTIVE   Patient seen and examined.    Daughter was at the bedside  He is alert and awake oriented to place and person but not to time  Still weak.  No complaints overnight.  No nausea vomiting or diarrhea  12 points of review of system r negative otherwise    OBJECTIVE     VITAL SIGNS   BP: 123/47 (11/17 1525)  Temp: 36.2 ???C (97.1 ???F) (11/17 1525)  Pulse: 82 (11/17 1525)  Respirations: 20 PER MINUTE (11/17 1525)  SpO2: 93 % (11/17 1525)  O2 Delivery: None (Room Air) (11/17 1525)       PHYSICAL EXAM   General: ???Alert and response to question appropriately. ???VS as above. No acute distress . ??????  Throat: MM dry  Lungs: ???No use of accessory muscles. ???Clear to auscultation bilaterally without wheezes, rales, or rhonchi.  Improving aeration in the left lung  Cardiovascular: RRR, ???No edema in BLE.   Abdomen: ???BS+, soft, ND, ???no guarding or rigidity. ???Nontender to palpation. PEG in place  Skin: Skin color normal, no obvious evidence of rashes.   Neurologic: ???Alerted and oriented x 2. ???  Psych:??????Alerted and oriented except for time, calm, normal affect    LAB REVIEW     Recent Labs      12/07/16   0558  12/08/16   0608   WBC  10.5  15.2*   HGB  8.0*  6.8*   HCT  24.4*  20.9*   PLTCT  474*  398   MCV  92.1  93.1     Recent Labs      12/07/16   0558  12/08/16   0608   NA  135*  138   K  3.5  3.3*   CL  104  110   CO2  25  24   BUN  28*  39*   CR  1.10  1.04   GAP  6  4   GFR  >60  >60       MEDICATIONS   Scheduled Meds:    aspirin chewable tablet 81 mg 81 mg PEG Tube QDAY carvedilol (COREG) tablet 12.5 mg 12.5 mg PEG Tube BID   cefpodoxime (VANTIN) tablet 200 mg 200 mg Oral Q12H*   cilostazol(+) (  PLETAL) tablet 50 mg 50 mg Per NG tube BID before meals   doxazosin (CARDURA) tablet 1 mg 1 mg PEG Tube QDAY   levothyroxine (SYNTHROID) tablet 100 mcg 100 mcg PEG Tube QDAY   montelukast (SINGULAIR) tablet 10 mg 10 mg PEG Tube QDAY   pantoprazole (PROTONIX) injection 40 mg 40 mg Intravenous QDAY   polyethylene glycol 3350 (MIRALAX) packet 17 g 1 packet PEG Tube BID   senna/docusate (SENOKOT-S) solution 10 mL 10 mL PEG Tube BID   Continuous Infusions:    PRN and Respiratory Meds:acetaminophen Q4H PRN, albuterol Q4H PRN, dextromethorphan/guaiFENesin Q4H PRN, pancrelipase 20,000 Units/ sodium bicarbonate 650 mg(#) PRN (On Call from Rx)      RADIOLOGY AND OTHER DIAGNOSTIC PROCEDURES REVIEW     iology and Othe  Pertinent radiology reviewed.

## 2016-12-08 NOTE — Progress Notes
1322 - PT notified RN after walking patient he begun to cough up bright frank red blood. Vital signs taken, O2 saturation below 92. RN placed patient on 4L NC and notified physician.

## 2016-12-08 NOTE — Progress Notes
2200: Dr Danley Dankerarakji notified of pts abd pain for recent PEG placement and bright red blood noted when residual was checked.  Orders given and implemented.  Will continue to monitor.

## 2016-12-08 NOTE — Progress Notes
PHYSICAL THERAPY   No treatment NOTE   Attempted to see pt after getting nurses ok. Nurse reports getting blood later this pm. Pt sleeping, family in room.  Daughter reports patient has been up walking twice this am and in chair.  Requests checking back tomorrow.  Thanks.            Therapist: Elberta FortisLaura Jazalyn Mondor, PT  Date: 12/08/2016

## 2016-12-09 ENCOUNTER — Encounter: Admit: 2016-12-09 | Discharge: 2016-12-09 | Payer: MEDICARE

## 2016-12-09 ENCOUNTER — Emergency Department
Admit: 2016-11-20 | Discharge: 2016-11-20 | Payer: MEDICARE | Attending: Student in an Organized Health Care Education/Training Program

## 2016-12-09 DIAGNOSIS — R05 Cough: Secondary | ICD-10-CM

## 2016-12-09 LAB — PROCALCITONIN: Lab: 0.2 ng/mL — ABNORMAL HIGH (ref <0.10)

## 2016-12-09 LAB — BASIC METABOLIC PANEL
Lab: 113 MMOL/L — ABNORMAL HIGH (ref 98–110)
Lab: 141 MMOL/L — ABNORMAL LOW (ref 137–147)
Lab: 5 pg (ref 3–12)
Lab: 8.1 mg/dL — ABNORMAL LOW (ref 8.5–10.6)
Lab: >60 mL/min (ref >60)
Lab: >60 mL/min (ref >60)

## 2016-12-09 LAB — CBC
Lab: 14 10*3/uL — ABNORMAL HIGH (ref 4.5–11.0)
Lab: 18 % — ABNORMAL HIGH (ref 11–15)
Lab: 2.3 M/UL — ABNORMAL LOW (ref 4.4–5.5)
Lab: 21 % — ABNORMAL LOW (ref 40–50)
Lab: 29 pg (ref 26–34)
Lab: 32 g/dL (ref 32.0–36.0)
Lab: 352 10*3/uL (ref 150–400)
Lab: 6.9 FL — ABNORMAL LOW (ref 7–11)
Lab: 91 FL (ref 80–100)
Lab: 15 10*3/uL — ABNORMAL HIGH (ref 4.5–11.0)

## 2016-12-09 NOTE — Progress Notes
2000: Dr Danley Dankerarakji notified of pt had small black BM (hemacult positive), 20mL of red sputum, and gastric residual from PEG tube also bright red in color. Orders given and implemented.

## 2016-12-09 NOTE — Progress Notes
PHYSICAL THERAPY  PROGRESS NOTE       MOBILITY:  Mobility  Progressive Mobility Level: Walk in hallway  Distance Walked (feet): 150 ft  Level of Assistance: Assist X1  Assistive Device: Walker    SUBJECTIVE:  Subjective  Significant hospital events: PMH significant for HTN, HLD, CAD, aortic stenosis s/p TAVR, CKD, PAD, lifetime smoker, chronic diastolic heart failure. Admitted 11/20/16 with weakness and cough. Found atelectasis and L PE with chest tube placed.   PEG tube placed 11/15  Mental / Cognitive Status: Alert;Cooperative (minimally conversant but smiles frequently)  Persons Present: Family  Pain: Patient has no complaint of pain  Pain Interventions: Patient agrees to participate in therapy (after encouragement and explanation)  Ambulation Assist: Independent Mobility in [employer] with Device  Patient Owned Equipment: Nurse, adult  Home Situation: Lives Alone  Type of Home: Apartment (independent living)  Entry Stairs: No Stairs  In-Home Stairs: No Stairs  Comments: patient plans to discharge to daughter's home post rehab for further assist/supervision as needed, as patient was active prior to admission and now requiring assist for safety. patient was active in coffee groups and currently unable to attend them. daughter plans to assist with cares- will be able to obtain assist post discharge.        BED MOBILITY/TRANSFERS:  Bed Mobility/Transfers  Comments: not observed- sitting in bedside chair at entry.   Transfer Type: Sit to/from Stand  Transfer: Assistance Level: To/From;Bed Side Chair;Standby Assist (contact assist)  Transfer: Assistive Device: Roller Walker;None  Transfers: Type Of Assistance: Verbal Cues;For Safety Considerations  End Of Activity Status: Up in Chair;Instructed Patient to Request Assist with Mobility;Instructed Patient to Use Call Light (TABS alarm activated, family at bedside)       GAIT:  Gait  Gait Distance: 150 feet  Gait: Assistance Level:  (contact guard assist) Gait: Assistive Device: Roller Walker;None  Gait: Descriptors: Pace: Slow;Decreased foot clearance RLE;Decreased heel strike RLE;Decreased step length (no loss of balance)  Comments: cues for upright posture and neutral neck position. cues for picking up RLE with stepping.        ACTIVITY/EXERCISE:  Activity / Exercise  Exercise:  (stepping over objects for RLE foot clearance)  Other Exercise:  (walking forward and backwards for emphasis on foot clearance)  Comments: repeated sit to stands with and without UE support       ASSESSMENT/PROGRESS:  Assessment/Progress  Assessment/Progress: Improving as Expected  Comments: continues to tolerate activities. needs to be better address in inpatient rehab.       AM-PAC 6 Clicks Basic Mobility Inpatient  Turning from your back to your side while in a flat bed without using bed rails: A Little  Moving from lying on your back to sitting on the side of a flatbed without using bedrails : A Little  Moving to and from a bed to a chair (including a wheelchair): A Little  Standing up from a chair using your arms (e.g. wheelchair, or bedside chair): A Little  To walk in hospital room: A Little  Climbing 3-5 steps with a railing: A Little  Raw Score: 18  Standardized (T-scale) Score: 41.05  Basic Mobility CMS 0-100%: 40.47  CMS G Code Modifier for Basic Mobility: CK    GOALS:  Goals  Goal Formulation: With Patient/Family  Time For Goal Achievement: 5 days  Pt Will Go Supine To/From Sit: w/ Stand By Assist  Pt Will Transfer Bed/Chair: w/ Stand By Assist  Pt Will Transfer Sit to Stand: w/ Stand By  Assist  Pt Will Ambulate: Greater than 200 Feet, w/ Walker, w/ Stand By Assist    PLAN:  Plan   Treatment Interventions: Mobility Training;Strengthening;Balance Activities;Endurance Training  Plan Frequency: 5 Days per Week  PT Plan for Next Visit: sit to stand practice, increase gait distance for endurance training, wean from device as tolerated, higher level standing balance. RECOMMENDATIONS:  PT Discharge Recommendations  PT Discharge Recommendations: Inpatient Setting;Recommend Physical Medicine and Rehabilitation Consult to address most appropriate level of rehabilitation placement  Equipment Recommendations: Roller Walker  Comments: patient was independent without device prior to admission. demonstrating mobility gains, but still presents with functional deficits that could be better addressed in an inpatient rehab setting- do not anticipate patient will have difficulty tolerating- would actually benefit from further activity and overall routine.     Therapist: Barnett Hatter  Date: 12/09/2016

## 2016-12-09 NOTE — Progress Notes
[id number] MED G paged: Patients tube feeds held overnight due to blood in the peg tube. would you like us to administer his medication or hold off for now?    Follow up: GI will be asked to come by again. Hold Lovenox and aspirin for now.

## 2016-12-10 ENCOUNTER — Inpatient Hospital Stay: Admit: 2016-12-10 | Discharge: 2016-12-10 | Payer: MEDICARE

## 2016-12-10 ENCOUNTER — Encounter: Admit: 2016-12-10 | Discharge: 2016-12-10 | Payer: MEDICARE

## 2016-12-10 DIAGNOSIS — R05 Cough: Secondary | ICD-10-CM

## 2016-12-10 LAB — HEMOGLOBIN & HEMATOCRIT
Lab: 25 % — ABNORMAL LOW (ref >60)
Lab: 8.5 g/dL — ABNORMAL LOW (ref >60)

## 2016-12-10 LAB — CBC: Lab: 11 K/UL — ABNORMAL HIGH (ref 4.5–11.0)

## 2016-12-10 LAB — BASIC METABOLIC PANEL: Lab: 141 MMOL/L — ABNORMAL LOW (ref 137–147)

## 2016-12-10 MED ORDER — SODIUM CHLORIDE 0.9 % IJ SOLN
50 mL | Freq: Once | INTRAVENOUS | 0 refills | Status: CP
Start: 2016-12-10 — End: ?

## 2016-12-10 MED ORDER — POLYETHYLENE GLYCOL 3350 17 GRAM PO PWPK
1 | Freq: Two times a day (BID) | GASTROSTOMY | 0 refills | Status: DC | PRN
Start: 2016-12-10 — End: 2016-12-12

## 2016-12-10 MED ORDER — IOPAMIDOL 76 % IV SOLN
65 mL | Freq: Once | INTRAVENOUS | 0 refills | Status: CP
Start: 2016-12-10 — End: ?
  Administered 2016-12-10: 20:00:00 65 mL via INTRAVENOUS

## 2016-12-10 MED ORDER — SENNA/DOCUSATE(#) 8.8/50MG/10ML PO SOLN
10 mL | Freq: Two times a day (BID) | GASTROSTOMY | 0 refills | Status: DC | PRN
Start: 2016-12-10 — End: 2016-12-12

## 2016-12-10 NOTE — Progress Notes
RT Adult Assessment Note    NAME:Javier Gutierrez             MRN: [id number]             DOB:Sep 19, 1940          AGE: 76 y.o.  ADMISSION DATE: 11/20/2016             DAYS ADMITTED: LOS: 20 days    RT Treatment Plan:  Protocol Plan: Medications  Albuterol: MDI PRN             Vital Signs:  Pulse: Pulse: 77  RR: Respirations: 18 PER MINUTE  SpO2: SpO2: 96 %  O2 Device:    Liter Flow:    O2%: O2 Percent: 21 %  Breath Sounds: All Breath Sounds: Clear (implies normal);Decreased  Respiratory Effort: Respiratory Effort: Non-Labored

## 2016-12-10 NOTE — Progress Notes
PHYSICAL THERAPY  NOTE         Patient was unavailable for physical therapy. Patient off the unit multiple times today. Physical therapy will continue to follow and provide intervention as indicated.    Therapist: Barnett HatterAmber Anyra Kaufman  Date: 12/10/2016

## 2016-12-10 NOTE — Progress Notes
Physical Medicine & Rehabilitation Progress Note     Patient name: Javier Gutierrez  Patient age: 76 y.o.  Today's Date:  12/10/2016  Admission Date: 11/20/2016  LOS: 20 days                     Assessment/Plan:     Principal Problem:    Parapneumonic effusion  Active Problems:    Tobacco use disorder    CAD (coronary artery disease)    Chronic diastolic (congestive) heart failure (HCC)    Pneumonia    Sepsis (HCC)    Acute respiratory failure with hypoxia (HCC)    Hyponatremia      Javier Gutierrez is a 76 y.o. male admitted to [facility] on 11/20/2016 with the following issues:    Debility 2/2 sepsis, complicated para pneumonic effusion leading to acute respiratory failure.   ???  Impairments: weakness  Activity Limitations:  dressing - upper, dressing - lower, transfers and ambulation  Participation Restrictions: unable to return home safely    This is a follow up visit from initial consultation performed on 11/2. Last seen by rehab 11/13.     Hospital Course: Mr Paisley is a 76 y/o male w/ Hx of diastolic CHF, AS s/p TAVR, CAD, CKD, HTN, who presented to [facility] ED on 10/30 with weakness and SOB. He was found on chest CT to have Lt sided lung consolidation concerning for PNA w/ effusion and a suspicious lumg mass. Pulmonolgy was consulted, placed pigtail CT to suction, fluid analysis consistent with complicated pneumonic effusion, CT placed to wall suction (cytology pending). Empiric abx therapies initiated. The patient's stay has been complicated by acute respiratory failure with new oxygen requirement, sepsis, shock. Repeat chest CT on 11/3 with small Lt hydropneumothorax, persistent Lt consolidation, new Rt lung infection followed by interval chest CT on 11/5 w/ slight inc patchy ground glass opacities Rt lung. CT removed. The patient is receiving a peg tube today and SLP is following for further assessment and recommendations. IV zosyn persists for CAP with complicated left parapneumonic pleural effusion, ID following. PEG Placed 11/15, tolerating TF w/ persistent SLP goals at next level of care. Hemoptysis noted on 11/17 and mild bleeding around PEG site. Pulm consult recommended from GI team (eval pending).     Post acute placement recommendations: The patient has medical complexity and currently has therapeutic goals to consider an acute inpatient rehab admission. He is tolerating TFs and continues to participate with rehab therapies, making functional gains. Recommend acute inpatient rehab pending Pulmonology consultation/ w/u for new onset hemoptysis.      Thank you for this consultation.  Please call our consult pager with questions or concerns.  [phone]        Subjective     Javier Gutierrez is a 76 y.o. male.  feels he is doing more with rehab therapies and is motivated to participate in intensive rehab therapy. He currently denies SOB, CP, cough.        Self-Care/ADLs:  Independent   Mobility: independent at community level w/ assistive device     Home Environment:  Home Situation: Lives Alone (11/23/2016 11:00 AM)  Patient Owned Equipment: Roller Walker (11/23/2016 11:00 AM)  Type of Home: Apartment (independent living) (11/23/2016 11:55 AM)  Entry Stairs: No Stairs (11/23/2016 11:00 AM)  In-Home Stairs: No Stairs (11/23/2016 11:00 AM)  Comments: Patient defers prior level of function information to be obtained from daughter. Daughter reports patient was living  at home alone in an independent living facility where he was fully indepenent up until about a week ago when he experienced a fall which resulted in patient being on the ground for an estimated 15 hours. Since then has used a roller walker for mobility. Daughter reports progressive weakness for about the past week leading up to hospitalization. No oxygen used at baseline. (11/21/2016 12:50 PM)  Bathroom Equipment: Grab Bars in Shower (11/23/2016 11:55 AM)    Current Level Of Function: PT Gait:Gait Distance: 150 feet Gait: Assistance Level:  (contact guard assist) Gait: Assistive Device: Roller Walker, None  Bed Mobility/Transfers  Bed Mobility: Supine to Sit: Minimal Assist, Verbal Cues, Use of Rail, Head of Bed Elevated, Assist with Trunk  Bed Mobility: Sit to Supine: Standby Assist, HOB Elevated, Use of Rail  Comments: not observed- sitting in bedside chair at entry.   Transfer Type: Sit to/from Stand  Transfer: Assistance Level: To/From, Bed Side Chair, Standby Assist (contact assist)  Transfer: Assistive Device: Nurse, adult, None  Transfers: Type Of Assistance: Materials engineer, For Safety Considerations  Other Transfer Type: Stand Pivot  Other Transfer: Assistance Level: To, Bed Side Chair, Standby Assist  Other Transfer: Assistive Device: Nurse, adult  Other Transfer: Type Of Assistance: For Strength Deficit, For Balance, Verbal Cues, For Safety Considerations  End Of Activity Status: Up in Chair, Instructed Patient to Request Assist with Mobility, Instructed Patient to Use Call Light (TABS alarm activated, family at bedside)  Comments: Upon returning to bed pt began to cough.  Provided him with the suction and bright red came out.  Alerted nurse immediately.     OT ADL's  Where Assessed: Edge of Bed, Standing at [employer]  Eating Deficits: NPO, but hand to mouth WNL (PEG tube)  Grooming Assist: Moderate Assist  Grooming Deficits: Verbal Cueing, Steadying (mouthwash use)  Bathing Assist:  (Daughter reports pt took a shower yesterday)  UE Dressing Assist: Moderate Assist (anticipated)  LE Dressing Assist: Stand By Assist  LE Dressing Deficits: Increased Time To Complete, Don/Doff R Shoe, Don/Doff L Shoe  Toileting Assist: Moderate Assist  Toileting Deficits: Clothing Management Up, Verbal Cueing  Functional Transfer Assist: Minimal Assist  Functional Transfer Deficits: Verbal Cueing (hand placement prior to sitting to control descent into chai) Comment: In addition to above ADL's, patient ambulates 150 ft. using roller walker, contact guard assist. Patient shuffles feet and demonstrates poor standing posture with cervical flexion. Provided frequent cues to keep head upright/neck in extension.     SLP COGNITIVE EVALUATION SUMMARY  Summary*  Overall Cognitive Severity Level: Mod  Prognosis: Fair  Plan: Patient Would Benefit from Further Speech Therapy Post Acute Hospitalization., Continue Treatment  2-3x/Week    PRAGMATICS: Comments*: Pragmatics judged to be WNL during evaluation.    BEHAVIOR: Comments*: Some increased processing time and need for repetition of information appreciated.    AUDITORY COMPREHENSION:   Comments*: Auditory comprehension judged to [facility], further assessment required.    ORIENTATION: Comments*: Pt oriented to perosn, place, time, situation. Question reduced insight into deficits as pt reports no cognitive deficits at this time.    AUDITORY ATTENTION/WORKING MEMORY: Comments*: Moderate deficits. Digits forward: up to 7, digits reversed: up to 3. Immediate three word recall: 3/3 with no need for repetition of words. Delayed three word recall: 0/3 independently, 2/3 with multiple choice cue (after three minutes); should be noted pt had no recollection of task. Counting by intervals: 2/14    AUDITORY MEMORY/SUSTAINED ATTENTION:      NEW  LEARNING:      SEQUENCING/ORGANIZATION:      PROBLEM SOLVING: Comments*: Pt demonstrates good verbal problem solving skills for 2/2 questions.    REASONING:        MATH/MONEY SKILLS:   Comments*: Moderate deficits: 1/2    VISUAL PERCEPTUAL:      SWALLOW EVALUATION SUMMARY  Summary: A clinical swallow evaluation was completed this date.     Oral Stage Summary*: Pt presented ice chips via spoon, thin liquid water via cup, pudding via spoon. Pt requiring cues to accept boluses between each trial. Pt with adequate bolus procurement. Slowed oral movements and AP transfer. Intermittently wiith oral holding at onset of trials.     Pharyngeal Stage Summary*: Suspect delayed swallow initaition. No voert s/s aspiration with ice chips. Throat clear in 10% of trials with small sips thin liquids via cup. Pt did not take large drinks of thin liquids in the presnence of cues. Pt also with intermittent mutliple swallows and vocal quality changes for thin liquids. Overt cough with pudding x2 as well as mutliple swallows and vocal quality changes. Would have suspicion for silent aspiration risk due to generalized weakness, weak cough/weak vocal quality.     Swallow Recommendations*  NPO:  (pt declines alternative source of nutrition)  PO: Ice Chips Only    Plan: Continue Treatment __x/week (Comment)., Other (Comment), Patient Would Benefit from Further Speech Therapy Post Acute Hospitalization. (2-3x/wk)    Prognosis: Fair, Good           Review of Systems -     A 10 point review of systems was negative accept where reported in the HPI    Medications  Scheduled Meds:    aspirin chewable tablet 81 mg 81 mg PEG Tube QDAY   carvedilol (COREG) tablet 12.5 mg 12.5 mg PEG Tube BID   cefpodoxime (VANTIN) tablet 200 mg 200 mg Oral Q12H*   cilostazol(+) (PLETAL) tablet 50 mg 50 mg Per NG tube BID before meals   doxazosin (CARDURA) tablet 1 mg 1 mg PEG Tube QDAY   levothyroxine (SYNTHROID) tablet 100 mcg 100 mcg PEG Tube QDAY   montelukast (SINGULAIR) tablet 10 mg 10 mg PEG Tube QDAY   pantoprazole (PROTONIX) injection 40 mg 40 mg Intravenous BID(11-21)   Continuous Infusions:    PRN and Respiratory Meds:acetaminophen Q4H PRN, albuterol Q4H PRN, dextromethorphan/guaiFENesin Q4H PRN, pancrelipase 20,000 Units/ sodium bicarbonate 650 mg(#) PRN (On Call from Rx), polyethylene glycol 3350 BID PRN, senna/docusate BID PRN      Objective                        Vital Signs: Last Filed                 Vital Signs: 24 Hour Range   BP: 132/51 (11/19 1226)  Temp: 36.8 ???C (98.2 ???F) (11/19 1226) Pulse: 72 (11/19 1226)  Respirations: 18 PER MINUTE (11/19 1226)  SpO2: 96 % (11/19 1226)  O2 Delivery: None (Room Air) (11/19 1226) BP: (132-169)/(51-80)   Temp:  [36.4 ???C (97.5 ???F)-36.8 ???C (98.2 ???F)]   Pulse:  [72-80]   Respirations:  [18 PER MINUTE-20 PER MINUTE]   SpO2:  [96 %-99 %]   O2 Delivery: None (Room Air)   Intensity Pain Scale (Self Report): 0 (12/10/16 0755) Vitals:    11/20/16 0518 11/27/16 1316   Weight: 68 kg (150 lb) 68 kg (150 lb)       Intake/Output Summary:  (Last 24  hours)    Intake/Output Summary (Last 24 hours) at 12/10/16 1357  Last data filed at 12/10/16 1000   Gross per 24 hour   Intake              671 ml   Output                0 ml   Net              671 ml      Stool Occurrence: 1       Oral Diet Order: NPO  Last Bowel Movement Date: 12/10/16             Physical Exam     BP: 132/51 (11/19 1226)  Temp: 36.8 ???C (98.2 ???F) (11/19 1226)  Pulse: 72 (11/19 1226)  Respirations: 18 PER MINUTE (11/19 1226)  SpO2: 96 % (11/19 1226)  O2 Delivery: None (Room Air) (11/19 1226)  Body mass index is 21.52 kg/m???.     Constitutional:Fatigued. NAD  Head: Normocephalic and atraumatic.   Eyes: Conjunctivae and EOM are normal.   Cardiovascular: Extremities well perfused.   Pulmonary/Chest: Effort normal. No respiratory distress.   Abdominal:  PEG  Skin: Skin is warm and dry.   Extremities: no peripheral edema   Psych: Pleasant mood

## 2016-12-10 NOTE — Progress Notes
Name:  ENRIGUE HASHIMI                                               MRN:  [id number]   Admission Date:  11/20/2016  Today's Date: 12/09/16  LOS: 19      ASSESSMENT AND PLAN     76 year old man with history of hypertension, hyperlipidemia, coronary artery disease, aortic stenosis, chronic kidney disease, peripheral artery disease, and lifetime smoker who presents with concern for weakness and cough.  Found to have complicated empyema.  Chest tube was inserted.  Pulmonary treated the empyema with TPA/dornase.  CT surgery decided there is no surgical indication for decortication.  There was concern on the CT for possible obstruction pneumonia and infection disease recommended to have bronchoscopy.  ???  Complicated parapneumonic effusion  - CT Chest 11/20/16: Consolidation of the majority of the left mid and lower lung, most compatible with pneumonia and partially loculated pleural effusion. Underlying neoplastic endobronchial lesion possible.  - LDH 293, white count 1400, pH of pleural fluid 7.29, cytology negative for malignant cells  - pleural fluid culture STREPTOCOCCUS INTERMEDIUS sensitive to PCN  - urine strep and leg Ag are negative, RVP negative  - s/p chest tube placement 11/20/16  - repeat CT 11/24/16 scan showed development of scattered opacities throughout the right lung that is most suggestive of pneumonia (new), Small, partially loculated left-sided hydropneumothorax with a pleural drain in place. ???The pleural fluid has mildly decreased and the pleural gas has developed since the October 30  - pulmonary following: Resume intrapleural t-PA/Dornase for 6 more doses, keep CT to -20cm wall suction when not clamped with t-pa or dornase, continue aggressive Pulmonary hygiene  - Nov 5: ID consulted recommended continue current antibiotics.   - Nov 5 : CTS consulted. No surgical intervention   - Nov 5: CT showed Slight??? increase??? in??? mild??? patchy??? groundglass??? opacities??? throughout??? the??? right??? lung. Unchanged??? small??? right??? pleural??? effusion.  -Status post full course of Levaquin, vancomycin, and Zosyn  - Repeated chest CT by pulmonary on November 7 reviewed.  No new changes  - Bronchoscopy with BAL done on 11/9, cultures are negative  - Pulmonology pulled chest tube on 11/11  -Plan to continue Cefpodoxime with a stop date of December 17, 2016    Hemoptysis  -Patient had episode of hemoptysis on November 16 and November 17  -Bloody output was noted during measuring gastric residual on November 17  -Bleeding around the PEG tube was noted externally on November 17  -GI evaluated the patient on November 18 and  - recommended pulmonary evaluation for hemoptysis.  They do not think the external bleeding around the PEG tube is responsible for the internal bleeding which is most likely related to hemoptysis according to them.  -We will continue to hold anticoagulation for now and continue PPI IV   -Re consult pulmonary team on November 18  - will repeat chest CT tomorrow.      leucocytosis/ Sepsis   - Echo was basically negative for any vegetation. Altho the study was difficult, in the setting of low clinical suspicion, no need for more testing.   - monitor cbc daily. procalcitonin and wbc are stable    Anemia and thrombocytosis  - due to chronic inflammation as per iron panel.   - transfused 1 unit pRBCs on 11/9  - cont  PPI  -Transfused 2 units of blood on November 17  -f/u Hemoccult blood  - hold Lovenox  ???  CAD  - Continue PTA Aspirin/COreg  - no chest pain  ???  Recent Mechanical fall  - CT head negative  - CT C spine was suspicious for possible lytic lesions to the c spine  - Consulted PT/OT   ???  HLD/HTN  - He is of his simvastatin and fenofibrate due to reported muscle weakness since last Thursday Nov 1st  - Continue PTA coreg and hydralazine  ???  Hypothyroidism  - TSH/Free T4 normal  - Continue PTA synthroid  ???  Aortic Valve stenosis   - S/P TAVR in 2017  ??? Euvolemic hypoNa  -Resolved    Anorexia, mild protein cal malnutrition?   - multiple etiology   - could be related to antibiotics, current illness, anemia, disliking hospital food  -feeding through the PEG tube started on November 16    Dysphagia  - concern for aspiration pneumonia and empyema  - reviewed Speech therapy note from initial consultation, however patient continues to cough during feedings  - patient pulled out corpak last night and refused replacement  - consulted Speech to re-evaluated with video swallow study, recommend NPO  - GI consulted and placed PEG on 11/15, started tube feeds November 16    PT/OT recommneds inpatient setting- Inpatient Rehab consulted, planning for transfer to Rehab once stable on tube feeds        Ppx  - SCDs, hold Lovenox    Disp  - continue inpatient care.  Will monitor off tube feeding overnight and check hemoglobin tomorrow.  We will also ask pulmonary to evaluate the patient for hemoptysis.    SUBJECTIVE   Patient seen and examined.    Daughter was at the bedside  He was sleepy as he was tired after working with physical therapy.  He had more energy today after the transfusion yesterday.  He had episode of hemoptysis as well as bleeding from gastric residual fluid overnight.   Tube feeding was held    OBJECTIVE     VITAL SIGNS   BP: 136/70 (11/18 1129)  Temp: 36.3 ???C (97.3 ???F) (11/18 1129)  Pulse: 78 (11/18 1129)  Respirations: 20 PER MINUTE (11/18 1129)  SpO2: 95 % (11/18 1129)  O2 Delivery: None (Room Air) (11/18 1129)       PHYSICAL EXAM   General: Sleepy today. ???VS as above. No acute distress . ??????  Throat: MM dry  Lungs: ???No use of accessory muscles. ???Clear to auscultation bilaterally without wheezes, rales, or rhonchi.  Improving aeration in the left lung  Cardiovascular: RRR, ???No edema in BLE.   Abdomen: ???BS+, soft, ND, ???no guarding or rigidity. ???Nontender to palpation. PEG in place  Skin: Skin color normal, no obvious evidence of rashes. Neurologic: Sleepy and tired after working with physical therapy   Psych calm, normal affect    LAB REVIEW     Recent Labs      12/08/16   2225  12/09/16   0325   WBC  14.7*  15.6*   HGB  6.9*  8.7*   HCT  21.2*  25.9*   PLTCT  352  321   MCV  91.7  92.0     Recent Labs      12/08/16   0608  12/09/16   0325   NA  138  141   K  3.3*  3.5   CL  110  113*   CO2  24  23   BUN  39*  39*   CR  1.04  0.84   GAP  4  5   GFR  >60  >60       MEDICATIONS   Scheduled Meds:    aspirin chewable tablet 81 mg 81 mg PEG Tube QDAY   carvedilol (COREG) tablet 12.5 mg 12.5 mg PEG Tube BID   cefpodoxime (VANTIN) tablet 200 mg 200 mg Oral Q12H*   cilostazol(+) (PLETAL) tablet 50 mg 50 mg Per NG tube BID before meals   doxazosin (CARDURA) tablet 1 mg 1 mg PEG Tube QDAY   levothyroxine (SYNTHROID) tablet 100 mcg 100 mcg PEG Tube QDAY   montelukast (SINGULAIR) tablet 10 mg 10 mg PEG Tube QDAY   pantoprazole (PROTONIX) injection 40 mg 40 mg Intravenous BID(11-21)   polyethylene glycol 3350 (MIRALAX) packet 17 g 1 packet PEG Tube BID   senna/docusate (SENOKOT-S) solution 10 mL 10 mL PEG Tube BID   Continuous Infusions:    PRN and Respiratory Meds:acetaminophen Q4H PRN, albuterol Q4H PRN, dextromethorphan/guaiFENesin Q4H PRN, pancrelipase 20,000 Units/ sodium bicarbonate 650 mg(#) PRN (On Call from Rx)      RADIOLOGY AND OTHER DIAGNOSTIC PROCEDURES REVIEW     iology and Othe  Pertinent radiology reviewed.

## 2016-12-10 NOTE — Progress Notes
Gastroenterology Progress Note  Patient Name:Javier Gutierrez         KGM:[id number]  Admission Date: 11/20/2016  5:11 AM        Reason for consult:  Peg placement    Assessment:  76 yo F w/ PMHx of hypertension, hyperlipidemia, coronary artery disease, aortic stenosis, chronic kidney disease, peripheral artery disease, and lifetime smoker who presents with concern for weakness and cough. ???PEG placed on 11/15.   ???  # Bleeding around the PEG tube site  # hemoptysis  DDx: PEG tract bleeding, UGIB, versus swallowed blood from hemoptysis  - Hgb initially dropped 1.5 g, however, he has remained stable over the past two days  - will continue to monitor    Recommendations:  - we will monitor him one more day and watch his Hgb closely, if he has any further bleeding from the PEG tube or has any further melena, we will plan for repeat EGD    Patient discussed with attending on service, Dr. Jomarie Longs.    Renea Ee, MD  Gastroenterology & Hepatology Fellow  Pager [phone]      -----------------------------  HPI/Subjective:    The patient feels well, denies any fever/chills, SOB, chest pain, N/V, abd pain, dysuria, no melena and only minimal bleeding around the PEG tube, no blood in the aspirate.    He has quite a lot of hemoptysis however.    PMH:  Past Medical History:   Diagnosis Date   ??? Acute on chronic systolic heart failure, NYHA class 2 (HCC)    ??? Aortic valve stenosis, mild 11/29/2008   ??? Arterial occlusion     left kidney-two stents placed   ??? Arthritis     lower back   ??? Asthma    ??? CAD (coronary artery disease) 11/29/2008   ??? Cardiomyopathy (HCC) 11/29/2008   ??? Carotid artery plaque    ??? Colon polyps    ??? Frailty    ??? 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 HTN    ??? Hyperlipidemia    ??? Hyperlipidemia 11/29/2008   ??? Hypertension 11/29/2008   ??? Hypothyroidism    ??? Tobacco abuse      No current facility-administered medications on file prior to encounter. Current Outpatient Prescriptions on File Prior to Encounter   Medication Sig Dispense Refill   ??? aspirin EC 81 mg tablet Take 1 Tab by mouth daily. Take with food. 90 Tab 3   ??? carvedilol (COREG) 25 mg tablet Take 1 tablet by mouth twice daily. Take with food. 180 tablet 3   ??? cilostazol(+) (PLETAL) 50 mg tablet Take 50 mg by mouth twice daily. Take on an empty stomach at least 30 minutes before or 2 hours after food.     ??? dutasteride (AVODART) 0.5 mg PO capsule Take 0.5 mg by mouth Daily.     ??? fish oil- omega 3-DHA/EPA 300/1,000 mg capsule Take 1 Cap by mouth twice daily.     ??? hydrALAZINE (APRESOLINE) 100 mg tablet TAKE 1 TABLET BY MOUTH THREE TIMES DAILY 90 tablet 3   ??? levothyroxine (SYNTHROID) 100 mcg tablet Take 1 tablet by mouth daily.     ??? montelukast (SINGULAIR) 10 mg tablet TAKE ONE TABLET BY MOUTH ONCE DAILY 90 Tab 2     Past Surgical History:   Procedure Laterality Date   ??? HX LUMBAR DISKECTOMY  1968    30 years ago   ??? STENT  INTRAVASCULAR  2006    kidney stent placed   ??? PERCUTANEOUS CORONARY INTERVENTION N/A 03/11/2015    Possible Percutaneous Coronary Intervention performed by Marcell Barlow, MD, [facility] at [facility] LAB   ??? UPPER GASTROINTESTINAL ENDOSCOPY N/A 04/26/2015    ESOPHAGOGASTRODUODENOSCOPY performed by Tempie Hoist, DO at ENDO/GI   ??? UPPER GASTROINTESTINAL ENDOSCOPY  04/26/2015    ESOPHAGOGASTRODUODENOSCOPY BIOPSY performed by Tempie Hoist, DO at ENDO/GI   ??? AORTIC VALVE REPLACEMENT N/A 08/03/2015    REPLACEMENT TRANSCATHETER AORTIC VALVE (Sapien 26s3), right common femoral artery approach performed by Zella Richer, MD at [facility]   ??? BRONCHOSCOPY Bilateral 11/30/2016    BRONCHOSCOPY performed by Audie Box, MD at [facility]   ??? BRONCHOSCOPY Right 11/30/2016    BRONCHOSCOPY WITH BRONCHIAL ALVEOLAR LAVAGE - FLEXIBLE performed by Audie Box, MD at [facility]   ??? UPPER GASTROINTESTINAL ENDOSCOPY N/A 12/06/2016 ESOPHAGOGASTRODUODENOSCOPY for PEG placement performed by Buckles, Vinnie Level, MD at ENDO/GI     Social History     Social History   ??? Marital status: Widowed     Spouse name: N/A   ??? Number of children: N/A   ??? Years of education: N/A     Occupational History   ??? Not on file.     Social History Main Topics   ??? Smoking status: Current Every Day Smoker     Packs/day: 0.50     Years: 60.00     Types: Cigarettes   ??? Smokeless tobacco: Never Used      Comment: 0.5-2 ppd history   ??? Alcohol use No   ??? Drug use: No   ??? Sexual activity: Not on file     Other Topics Concern   ??? Not on file     Social History Narrative   ??? No narrative on file     Family History   Problem Relation Age of Onset   ??? Stroke Mother    ??? Other Mother         colon cancer   ??? Other Father         valve disease/COPD       Physical Exam:  Vitals:    12/09/16 2316 12/10/16 0730 12/10/16 1226 12/10/16 1600   BP: 132/58 169/80 132/51    Pulse: 75 80 72 77   Temp: 36.4 ???C (97.5 ???F) 36.6 ???C (97.9 ???F) 36.8 ???C (98.2 ???F)    SpO2: 97% 96% 96% 96%   Weight:       Height:         General - Alert and oriented, no acute distress.   Head - Normocephalic, atraumatic.   Eyes -  EOMI grossly. No icterus or injection.   Oropharynx- No ulcer or bleeding, moist mucosa.  Neck - No swelling or tracheal deviation.   Pulmonary/Chest - Normal effort, normal breath sounds.  CV - Normal rate, regular rhythm, normal heart sounds and intact distal pulses.  Abd - crusted blood around the PEG, ND, soft, Non-TTP. No hepatospenomegaly. Normal bowel sounds. No masses palpable.  Extremities - Warm, dry.   Skin - No exposed rash, lesion.  Neurological - AAOx2, No gross deficit    Labs/Imaging:  Pertient labs/imaging were reviewed on initiation of progress note.   24-hour labs:    Results for orders placed or performed during the hospital encounter of 11/20/16 (from the past 24 hour(s))   HEMOGLOBIN & HEMATOCRIT    Collection Time: 12/09/16  8:00 PM   Result  Value Ref Range Hemoglobin 8.5 (L) 13.5 - 16.5 GM/DL    Hematocrit 16.1 (L) 40 - 50 %   CBC    Collection Time: 12/10/16  6:17 AM   Result Value Ref Range    White Blood Cells 11.9 (H) 4.5 - 11.0 K/UL    RBC 2.73 (L) 4.4 - 5.5 M/UL    Hemoglobin 8.4 (L) 13.5 - 16.5 GM/DL    Hematocrit 09.6 (L) 40 - 50 %    MCV 93.2 80 - 100 FL    MCH 30.7 26 - 34 PG    MCHC 32.9 32.0 - 36.0 G/DL    RDW 04.5 (H) 11 - 15 %    Platelet Count 328 150 - 400 K/UL    MPV 8.1 7 - 11 FL   BASIC METABOLIC PANEL    Collection Time: 12/10/16  6:17 AM   Result Value Ref Range    Sodium 141 137 - 147 MMOL/L    Potassium 3.7 3.5 - 5.1 MMOL/L    Chloride 112 (H) 98 - 110 MMOL/L    CO2 26 21 - 30 MMOL/L    Anion Gap 3 3 - 12    Glucose 125 (H) 70 - 100 MG/DL    Blood Urea Nitrogen 29 (H) 7 - 25 MG/DL    Creatinine 4.09 0.4 - 1.24 MG/DL    Calcium 7.9 (L) 8.5 - 10.6 MG/DL    eGFR Non African American >60 >60 mL/min    eGFR African American >60 >60 mL/min

## 2016-12-10 NOTE — Patient Education
Medication Education    Letta Moynahanhomas Balboni accepted counseling and was engaged.  he demonstrated understanding.    The following medications were discussed:  Coreg  Vantin  Pletal  Cardura  Protonix  Ambulating    Where indicated, the patient was provided with additional medication and/or disease-state information.  All patient questions were answered and patient acknowledged understanding of the medications, side effects and other pertinent medication information.    Follow up should occur daily.    Continue to address: indications    Mayford KnifeMadison 

## 2016-12-10 NOTE — Case Management (ED)
Per chart review, patient will neeed updated OT ans SLP notes for review. Patient with hemoptysis and drop in Hgb to 6.9 on 12/08/2016 requiring 2 units PRBC. Plans for CT chest today and pulmonary consult for potential further workup on etiology of hemoptysis. Will r/u with SW re: timeline for potential dc.      E.Bernon Arviso, Inpatient Admissions Nurse/Rehab. (office# [phone] or voalte# [phone]).

## 2016-12-10 NOTE — Case Management (ED)
Case Management Progress Note    NAME:Javier Gutierrez                          MRN: [id number]              DOB:1940/08/04          AGE: 76 y.o.  ADMISSION DATE: 11/20/2016             DAYS ADMITTED: LOS: 20 days      Todays Date: 12/10/2016    Plan  SW [facility] when medically [facility].    Interventions  ? Support   Support: Pt/Family Updates re:POC or DC Plan  ? Info or Referral      ? Discharge Planning   Discharge Planning: Inpatient Rehabilitation    SW reviewed EMR and discussed POC with team, anticipated DC Tuesday. Plan for CT today.   ID following-pt currently not on IV abx, on PO Vantin.   SW discussed with SLP and OT-need for updated notes prior to DC.  ? Medication Needs      ? Financial      ? Legal      ? Other        Disposition  ? Expected Discharge Date    Expected Discharge Date: 12/11/16  Expected Discharge Time: 1100  ? Transportation   Does the patient need discharge transport arranged?: No  Transportation Name, Phone and Availability #1: Wynona CanesChristine Bayly--daughter--[phone]  Does the patient use Medicaid Transportation?: No  ? Next Level of Care (Acute Psych discharges only)      ? Discharge Disposition                                          Durable Medical Equipment     No service has been selected for the patient.      [place] [facility]     No service has been selected for the patient.      [facility]     No service has been selected for the patient.      [facility]     No service has been selected for the patient.          Lianne Cure-Katie , LMSW *[phone]

## 2016-12-10 NOTE — Progress Notes
OCCUPATIONAL THERAPY  PROGRESS NOTE    Patient Name: Javier Gutierrez                   Room/Bed: BJ4782/95  Admitting Diagnosis:       Subjective  Pertinent Dx per Physician: PMH significant for HTN, HLD, CAD, aortic stenosis s/p TAVR, CKD, PAD, lifetime smoker, chronic diastolic heart failure. Admitted 11/20/16 with weakness and cough. Found atelectasis and L PE with chest tube placed.   Precautions: Falls  Pain Level Current: No pain    Objective  Persons Present: Son    Home Living  Type of Home: Apartment (independent living)  Home Layout: One Level  Financial risk analyst / Tub: Medical sales representative: Standard  Bathroom Equipment: Engineer, materials in [employer] Equipment: Environmental consultant (did not use prior to fall a week ago)    Prior Function  Level Of Independence: Independent with ADLs and functional transfers;Needed assistance with homemaking  Lives With: Alone  Receives Help From: Family  Other Function Comments: ILF does not provide meals. Pt's daughter states he eats out for breakfast and lunch and snacks the rest of the day. He has someone who comes to clean his apartment.    ADL's  Eating Deficits: NPO, but hand to mouth WNL (PEG tube)  Grooming Assist: Moderate Assist  Grooming Deficits: Verbal Cueing;Steadying (mouthwash use)  Bathing Assist:  Daughter reports pt took a shower yesterday with assist  UE Dressing Assist: Moderate Assist (anticipated)  LE Dressing Assist: Stand By Assist  LE Dressing Deficits: Increased Time To Complete;Don/Doff R Shoe;Don/Doff L Shoe  Toileting Assist: Moderate Assist  Toileting Deficits: Clothing Management Up;Verbal Cueing  Functional Transfer Assist: Minimal Assist  Functional Transfer Deficits: Verbal Cueing (hand placement prior to sitting to control descent into chair)    Activity Tolerance  Sitting Balance: 5/5 Moves/Returns Trunkal Midpoint in All Planes > 2 Inches    UE Strength / HEP  Comment: Patient completes 4 sets x10 reps using green thera-band. Patient tolerates well but requires assistance for technique. Plan to increase reps to 15 next time.     Education  Persons Educated: Patient/Family  Topics: UE Exercises    Assessment  Assessment: Decreased ADL Status;Decreased UE Strength;Decreased Cognition;Decreased Endurance;Decreased Self-Care Trans;Decreased High-Level ADLs  Prognosis: w/Cont OT s/p Acute Discharge;Fair    AM-PAC 6 Clicks Daily Activity Inpatient  Putting on and taking off regular lower body clothes?: A Little  Bathing (Including washing, rinsing, drying): A Little  Toileting, which includes using toilet, bedpan, or urinal: A Little  Putting on and taking off regular upper body clothing: A Little  Taking care of personal grooming such as brushing teeth: A Little  Eating meals?: Total (PEG)  Daily Activity Raw Score: 16  Standardized (t-scale) score: 35.96  CMS 0-100% Score: 53.32  CMS G Code Modifier: CK    Plan  OT Frequency: 5x/week      ADL Goals  Patient Will Perform Grooming: Standing at Sink;w/ Stand By Assist  Patient Will Perform LE Dressing: w/ Stand By Assist  Patient Will Perform Toileting: w/ Stand By Assist    Functional Transfer Goals  Pt Will Perform All Functional Transfers: w/ Stand By Assist        OT Discharge Recommendations  OT Discharge Recommendations: Inpatient Setting, To address deficits, maximize function and improve safety.  Equipment Recommendations: Too early to be determined  Recommend ongoing assistance for: Transfers, Bed mobility, Ambulation, Stairs, Safety concerns, In and out of house  Therapist: Theotis Burrow, OTA [phone]  Date: 12/10/2016

## 2016-12-10 NOTE — Patient Education
Medication Education    Letta Moynahanhomas Web accepted counseling and was engaged.  he demonstrated understanding.    The following medications were discussed:  Aspirin  Carvedilol  Cefpodoxime  Cilostazole  Doxazosin  Levothyroxine  Montelukast  Pantoprazole  Polyethylene Glycol   Senna  Importance of bathing     Where indicated, the patient was provided with additional medication and/or disease-state information.  All patient questions were answered and patient acknowledged understanding of the medications, side effects and other pertinent medication information.    Follow up should occur daily.    Continue to address: indications    Mayford KnifeMadison 

## 2016-12-10 NOTE — Progress Notes
SPEECH-LANGUAGE PATHOLOGY  DAILY TREATMENT NOTE      Patient seen 1x this date.  Documentation reflects all daily treatment sessions.    SUMMARY OF THERAPY SESSION: pt seen for dysphagia therapy session. Pt s/p peg tube placement. Pt with hemoptysis over the weekend, with dried and some bright red blood noted in oral cavity and suctioning canister. Oral care initiated, still with some red blood in oral cavity remaining. Pt accepted two ice chips with intermittent oral holding, increasing risk for premature spillage and penetration/aspiration. Once oral prep initiated, it was noted to be prolonged. No overt s/s aspiration appreciated; however, pt with documented silent aspiration. Session dc early due to pt reporting toileting needs and then primary team arriving to evaluate pt.     RECOMMENDATIONS  NPO with PEG  Meds via PEG  Excellent oral care  Ice chip protocol (7-10 per hour, only as tolerated)  SLP will follow for ongoing assessment of swallow and cognitive communication skills. Ongoing SLP at next level of care  Consistent supervision recommended upon discharge as anticipate patient's impaired memory & attention will potentially impact their safety.  Recommend assist aw/ finance & medication management and meal preparation upon discharge secondary to patient's impaired attention and/or memory.     Goal : Pt will tolerate ice chip protocol with less than 10% overt s/s aspiration and free from respiratory status changes.   Met  Comment: tolerating well with no overt s/s aspiration, though pt with documented risk for silent aspiration.   Continue to address this goal    Goal : Pt will complete pharyngeal strengthening exercises with 70% accuracy and minimal cues.   Not met  Comment: exercises provided, pt denies completing. Not directly addressed today due to session dc early. Will continue to reinforce.   Continue to address this goal    PLAN / RECOMMENDATIONS: Continue treatment 3x/week and Patient would benefit from further speech therapy post acute hospitalization.     Speech recommend ongoing assistance for: Swallow strategies, Safety concerns    Therapist: Margit Banda, MS,CCC-SLP [phone]  Date: 12/10/2016

## 2016-12-10 NOTE — Progress Notes
Infectious Disease Progress Note    Name:  Javier Gutierrez   [id number] Date:  12/10/2016    Assessment:     Mr Javier Gutierrez is a 76 year old male with history of HTN,DL, hypothyroidism, RAS post left renal stent, CAD, Diastolic HF, Severe AS post TAVR in 07/2015, CKD,PAD, tobacco abuse admitted on 10/30 for CAP complicated by parapneumonic left pleural effusion and developed a new pneumonia.  ???  ???  -CAP with complicated left parapneumonic pleural effusion  -Loculated Empyema  -patient admitted on 10/30 with 1 week history of progressive weakness, productive cough, progressive SOB.  -WBC was ???18400 on admission  -CT chest on 10/30 showed Consolidation of the majority of the left mid and lower lung, most compatible with pneumonia and partially loculated pleural effusion.Moderate sized partially loculated loculated left pleural effusion with areas of probable pleural thickening.  - post chest tube placement on 10/30 and tPA  -pleural fluid studies showed exudative fluid with pH=7.29, WBC=1400, glucose=24, culture grew strep intermedius sensitive to PCN, cytology negative.  -patient was started on VANC + Zosyn on 10/30  -Repeat CT chest 11/3 showed ???Small, partially loculated left-sided hydropneumothorax with a pleural drain in place with persistent left consolidation and new right lung infection.  -tPA was extended for 6 more doses.   -CTS consulted---> no surgical intervention.  -Bronchoscopy done mainly showed WBC=370 with 93% monocytes, gram stain with rare gram positive cocci.  ???  -Hospital-Acquired pneumonia  -On 11/3 CT chest repeat showed Small, partially loculated left-sided hydropneumothorax with a pleural drain in place + Progressive consolidation of the near entirety of the left lower lobe + ???Development of scattered opacities throughout the right lung that is most suggestive of pneumonia.  -patient with persistent leucocytosis despite 5 days of Zosyn and Vancomycin,levofloxacin added -Repeat CT chest 11/5 showed Slight increase in mild patchy groundglass opacities throughout the   right lung.  -Procal=0.12  -Sputum gram stain no organisms seen.  ???  -CAD  ??? 03/11/15: Cardiac cath ( via Rt radial approach), 30%pLAD, 40%mLAD, 50-60%mLcx, 30-40%pRCA, 30%mRCA followed by 30% distal   ???  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  -Tobacco abuse  -31 pack-year smoking history.        Recommendations:     Reviewed CXR 11/16 on PACS, improving  Continue po vantin until 11/26    Monitor hemoptysis carefully, hgb stable today, if further decrease the would recommend CT scan as per GI recs    Noted procalcitonin 0.25, unclear significance    Complexity of medical decision making is high due to the multi-system nature of the infectious disease process with concerns including but not limited to complexity of the patient's underlying illnesses, the identification and sensitivities of the organisms being treated, the potential for antimicrobial toxicities and drug-drug interactions, concerns regarding immunologic function, and interplay of other issues. New hemoptysis, pneumonia, empyema, multiple comorbidities     _____________________________________________________________________________  Interval History  Has been having mild hemoptysis over the weekend, also some blood from peg site, had transfusion Patient denies fever, no chills, no abdominal pain, no chest pain, no SOB.      No fever     labs     wbc 11.9, better, creat ok  hgb stable      Antimicrobial Start date End date   zosyn 10/30 11/16   vanco 10/30 11/14   Levo 11/3 11/7   vantin 11/16  Estimated Creatinine Clearance: 70.8 mL/min (based on SCr of 0.84 mg/dL).    Medications  Scheduled Meds:    aspirin chewable tablet 81 mg 81 mg PEG Tube QDAY   carvedilol (COREG) tablet 12.5 mg 12.5 mg PEG Tube BID   cefpodoxime (VANTIN) tablet 200 mg 200 mg Oral Q12H* cilostazol(+) (PLETAL) tablet 50 mg 50 mg Per NG tube BID before meals   doxazosin (CARDURA) tablet 1 mg 1 mg PEG Tube QDAY   levothyroxine (SYNTHROID) tablet 100 mcg 100 mcg PEG Tube QDAY   montelukast (SINGULAIR) tablet 10 mg 10 mg PEG Tube QDAY   pantoprazole (PROTONIX) injection 40 mg 40 mg Intravenous BID(11-21)   polyethylene glycol 3350 (MIRALAX) packet 17 g 1 packet PEG Tube BID   senna/docusate (SENOKOT-S) solution 10 mL 10 mL PEG Tube BID   Continuous Infusions:  PRN and Respiratory Meds:acetaminophen Q4H PRN, albuterol Q4H PRN, dextromethorphan/guaiFENesin Q4H PRN, pancrelipase 20,000 Units/ sodium bicarbonate 650 mg(#) PRN (On Call from Rx)      Physical Examination                          Vital Signs: Last                  Vital Signs: 24 Hour Range   BP: 132/58 (11/18 2316)  Temp: 36.4 ???C (97.5 ???F) (11/18 2316)  Pulse: 75 (11/18 2316)  Respirations: 18 PER MINUTE (11/18 2316)  SpO2: 97 % (11/18 2316)  O2 Delivery: None (Room Air) (11/18 2316) BP: (132-145)/(57-70)   Temp:  [36.3 ???C (97.3 ???F)-36.7 ???C (98 ???F)]   Pulse:  [75-78]   Respirations:  [18 PER MINUTE-20 PER MINUTE]   SpO2:  [93 %-99 %]   O2 Delivery: None (Room Air)     General appearance:???alert, oriented, NAD  HENT: mucus membranes moist, no oral lesions/thrush  Eyes: PERRL, EOM grossly intact, Conj nl, pinpoint pupils  Neck: supple, no lymphadenopathy,   Lungs:???crackles on the left base, ronchi  Heart: Regular rhythm, reg rate, with no murmur, rub, gallop  Abdomen:???soft, non-tender, non-distended, normoactive bowel sounds, no hepatosplenomegaly, no masses  Ext:??????No clubbing, cyanosis or edema  Skin: no rashes/lesions        Laboratory   Hematology  Recent Labs      12/08/16   0608  12/08/16   2225  12/09/16   0325  12/09/16   2000   WBC  15.2*  14.7*  15.6*   --    HGB  6.8*  6.9*  8.7*  8.5*   HCT  20.9*  21.2*  25.9*  25.7*   PLTCT  398  352  321   --      Chemistry  Recent Labs      12/08/16   0608  12/09/16   0325   NA  138  141 K  3.3*  3.5   CL  110  113*   CO2  24  23   BUN  39*  39*   CR  1.04  0.84   GFR  >60  >60   GLU  202*  99   CA  7.7*  8.1*       Microbiology, Radiology and other Diagnostics Review   Microbiology reviewed.    Pertinent radiology viewed.        Pasty Spillers, MD   Pager [phone]  Infectious Diseases Faculty

## 2016-12-10 NOTE — Consults
[facility]   Inpatient Consultation  Admit Date: 11/20/2016  Date of Service: 12/10/2016    Consult Type:  Opinion with orders  Reason for Consult: Hemoptysis:      s/p negative bronch. cxr stable    Clinical Impression:  1.  New onset Hemoptysis of small volume  2.  Left-sided Empyema s/p Chest Drain; intrapleural TPA/Dornase; Drain removal (11/11)  3.  Hospital-Acquired pneumonia, involving the right lung  4.  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  History of CVA; Dementia  6.  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  Newly discover Acute Pulmonary Embolism (superior RLL subsegmental branches)    Recommendations:  --- New-onset hemoptysis initially concerning for a parotid range of underlying etiologies.  Blood was noted to be darker and fairly thick, suggestive of either gastric exposed blood and/or blood admixed with necrotic tissue.  CT Chest without contrast obtained this morning with suggestion of intraparenchymal hemorrhage.  Agree with contrasted CTA Chest w/wo to evaluate the integrity of the pulmonary vasculature.   --- CT chest results revealed a new right-sided acute pulmonary emboli with a subsegmental superior right lower lobe branches.  Overall improvement in scattered pulmonary infiltrates compared to previous scans following his antimicrobial courses.  While patient's previous large left pleural effusion/empyema significantly improved, much of his left lung remains densely consolidated in its appearance is concerning for necrotizing pneumonia.  This could explain   both his mild hemoptysis and changes in parenchymal appearance on CT.  >> Thorough discussion was had regarding future plan of care and management of his severe pneumonia with mild hemoptysis, recent subcutaneous bleeding status post PEG in the setting of a newly diagnosed pulmonary emboli..  The patient, Javier Gutierrez, myself,  patient's son were present and the patient's daughter participated via cell phone.  The risks and benefits of starting anticoagulation for his PE was discussed at length.  Alternatively, the risks and benefits of not starting anticoagulation for a small pulmonary emboli was also discussed in light of his rising pneumonia, which has been improving.  Family and team remain in agreement to forego initiation of anticoagulation at this time.  As the risks for significant and life-threatening hemoptysis was not something he wanted to pursue them.  Patient and family expressed their desire to transition to rehab facility soon as able in order for Mr. Javier Gutierrez to regain his strength, which would in turn decrease his risk for developing second venous thrombosis.  >> Mr. Javier Gutierrez, with agreement of his family, elected to forego any coagulant therapy for his PE.  >> Recommend obtaining bilateral lower xtremity Dopplers to assess for residual VTE  >> Patient's hemoglobin has stabilized ~8.5 would be judicious with his pRBC replacement, as he is at high risk forTACO   >> Defer treatment course of Abx ot Infectious Diseases  >> Encourage ambulation and Rehabilitation therapies, as they are paramount to his continue recover    Pt seen and discussed with Dr. Lisette Abu, MD  Fellow, Pulmonary & Critical Care  Pager: [phone]    _____________________________________________________________________________  History of Present Illness:  Javier Gutierrez is a 76 y.o. 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 s/p TPA/Dornase x2 (12 dwells total). Chest drain removed on 11/11. Bronchoscopy performed on 11/9 in light of progressive pulmonary GG infiltrates seen on imaging. Rare Gram(+) Cocci grew on BAL culture (not speciated) and remains on HAP Abx coverage. BAL fluid with 93% monos.  Repeat CT Chest on 11/5 with slight progression of right infiltrates. Patient developed hemoptysis (small amount) over the weekend, which was a new occurrence. Only notable change in care included the placement of a PEG Tube on 11/15, complicated by cutaneous bleeding. CT Chest W/O obtained today indicated areas consistent with intraparenchymal hemorrhage of uncertain etiology with decreased size of Left effusion and resolution of fluid on the right. Patchy infiltrates improved throughout. Gastric melena aspirated from PEG, concerning for either pulmonary blood source or GI course with pulmonary aspiration.  Pulmonary Medicine was consulted for further evaluation and management of new onset hemoptysis.         12/10/2016 - CT Chest w/o **  1. Persistent extensive consolidation within the left lower lobe,   compatible with pneumonia, with development of areas of intraparenchymal   hemorrhage, which is of uncertain etiology and may be related to   pneumonia. However, CTA of the pulmonary arteries should be considered to   evaluate for pulmonary artery pseudoaneurysm. Follow-up CT chest in 2-3   months is also recommended to confirm resolution of consolidation and   exclude an underlying neoplasm.  2. Additional patchy nodular and groundglass opacities throughout both   lungs have slightly improved and are also likely infectious.  3. Decrease in size of a trace left pleural effusion and resolution of   right pleural effusion.  4. Mild mediastinal adenopathy, likely reactive.           Review of Systems:  A comprehensive 12 point review of systems was negative except for: Mild hemoptysis (resolving), shortness of breath, fatigue, intermittent confusion      Past Medical History:   Diagnosis Date ??? Acute on chronic systolic heart failure, NYHA class 2 (HCC)    ??? Aortic valve stenosis, mild 11/29/2008   ??? Arterial occlusion     left kidney-two stents placed   ??? Arthritis     lower back   ??? Asthma    ??? CAD (coronary artery disease) 11/29/2008   ??? Cardiomyopathy (HCC) 11/29/2008   ??? Carotid artery plaque    ??? Colon polyps    ??? Frailty    ??? 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 HTN    ??? Hyperlipidemia    ??? Hyperlipidemia 11/29/2008   ??? Hypertension 11/29/2008   ??? Hypothyroidism    ??? Tobacco abuse      Past Surgical History:   Procedure Laterality Date   ??? HX LUMBAR DISKECTOMY  1968    30 years ago   ??? STENT INTRAVASCULAR  2006    kidney stent placed   ??? PERCUTANEOUS CORONARY INTERVENTION N/A 03/11/2015    Possible Percutaneous Coronary Intervention performed by Marcell Barlow, MD, [facility] at CATH [facility]   ??? UPPER GASTROINTESTINAL ENDOSCOPY N/A 04/26/2015    ESOPHAGOGASTRODUODENOSCOPY performed by Tempie Hoist, DO at ENDO/GI   ??? UPPER GASTROINTESTINAL ENDOSCOPY  04/26/2015    ESOPHAGOGASTRODUODENOSCOPY BIOPSY performed by Tempie Hoist, DO at ENDO/GI   ??? AORTIC VALVE REPLACEMENT N/A 08/03/2015    REPLACEMENT TRANSCATHETER AORTIC VALVE (Sapien 26s3),  right common femoral artery approach performed by Zella Richer, MD at [facility]   ??? BRONCHOSCOPY Bilateral 11/30/2016    BRONCHOSCOPY performed by Audie Box, MD at [facility]   ??? BRONCHOSCOPY Right 11/30/2016    BRONCHOSCOPY WITH BRONCHIAL ALVEOLAR LAVAGE - FLEXIBLE performed by Audie Box, MD at [facility]   ??? UPPER GASTROINTESTINAL ENDOSCOPY N/A 12/06/2016    ESOPHAGOGASTRODUODENOSCOPY for PEG placement performed by Buckles, Vinnie Level, MD at ENDO/GI     Family History   Problem Relation Age of Onset   ??? Stroke Mother    ??? Other Mother         colon cancer   ??? Other Father         valve disease/COPD     Social History     Social History   ??? Marital status: Widowed     Spouse name: N/A ??? Number of children: N/A   ??? Years of education: N/A     Social History Main Topics   ??? Smoking status: Current Every Day Smoker     Packs/day: 0.50     Years: 60.00     Types: Cigarettes   ??? Smokeless tobacco: Never Used      Comment: 0.5-2 ppd history   ??? Alcohol use No   ??? Drug use: No   ??? Sexual activity: Not on file     Other Topics Concern   ??? Not on file     Social History Narrative   ??? No narrative on file       Objective:     Vital Signs:  Last Filed in 24 hours   Vital Signs:  24 hour Range    BP: 169/80 (11/19 0730)  Temp: 36.6 ???C (97.9 ???F) (11/19 0730)  Pulse: 80 (11/19 0730)  Respirations: 20 PER MINUTE (11/19 0730)  SpO2: 96 % (11/19 0730)  O2 Delivery: None (Room Air) (11/19 0730) BP: (132-169)/(57-80)   Temp:  [36.3 ???C (97.3 ???F)-36.7 ???C (98 ???F)]   Pulse:  [75-80]   Respirations:  [18 PER MINUTE-20 PER MINUTE]   SpO2:  [94 %-99 %]   O2 Delivery: None (Room Air)         Body mass index is 21.52 kg/m???.    Allergies   Allergen Reactions   ??? Sulfa (Sulfonamide Antibiotics) UNKNOWN         Scheduled Medications:    aspirin chewable tablet 81 mg 81 mg PEG Tube QDAY   carvedilol (COREG) tablet 12.5 mg 12.5 mg PEG Tube BID   cefpodoxime (VANTIN) tablet 200 mg 200 mg Oral Q12H*   cilostazol(+) (PLETAL) tablet 50 mg 50 mg Per NG tube BID before meals   doxazosin (CARDURA) tablet 1 mg 1 mg PEG Tube QDAY   levothyroxine (SYNTHROID) tablet 100 mcg 100 mcg PEG Tube QDAY   montelukast (SINGULAIR) tablet 10 mg 10 mg PEG Tube QDAY   pantoprazole (PROTONIX) injection 40 mg 40 mg Intravenous BID(11-21)   polyethylene glycol 3350 (MIRALAX) packet 17 g 1 packet PEG Tube BID   senna/docusate (SENOKOT-S) solution 10 mL 10 mL PEG Tube BID       Continuous Infusions:     PRN and Respiratory Meds:  acetaminophen Q4H PRN, albuterol Q4H PRN, dextromethorphan/guaiFENesin Q4H PRN, pancrelipase 20,000 Units/ sodium bicarbonate 650 mg(#) PRN (On Call from Rx)      Physical Exam: Gen: Appears tired.  Alert, cooperative, no distress, appears stated age  HENT: Normocephalic. Conjunctivae clear. Supple neck.  Lungs: Right lung clear to auscultation   Left base with minimal air movement.  Some left midlung field rales appreciated thin,  Heart: Regular rate and rhythm, S1, S2 normal,    BLE without edema.   Abd: Soft, non-tender.  Bowel sounds normal.  No palpable masses.  Ext: No clubbing or cyanosis appreciated.  Neuro: Appear to have difficulty articulating extended detailed thoughts/ideas.  Pulses: 2+ and symmetric   Skin: No rashes appreciated      Lab Review:   Pertinent labs reviewed    Radiology Review:  Pertinent imaging studies reviewed      Pricilla Loveless, MD  Pager [phone]

## 2016-12-11 DIAGNOSIS — R05 Cough: Secondary | ICD-10-CM

## 2016-12-11 LAB — CBC: Lab: 9.4 K/UL — ABNORMAL HIGH (ref 4.5–11.0)

## 2016-12-11 LAB — BASIC METABOLIC PANEL: Lab: 140 MMOL/L — ABNORMAL LOW (ref 137–147)

## 2016-12-11 MED ORDER — DEXTROMETHORPHAN-GUAIFENESIN 10-100 MG/5 ML PO SYRP
10 mL | GASTROSTOMY | 0 refills | Status: DC | PRN
Start: 2016-12-11 — End: 2016-12-12

## 2016-12-11 MED ORDER — AMOXICILLIN-POT CLAVULANATE 400-57 MG/5 ML PO SUSR
800 mg | Freq: Two times a day (BID) | GASTROSTOMY | 0 refills | Status: DC
Start: 2016-12-11 — End: 2016-12-12
  Administered 2016-12-12 (×2): 800 mg via GASTROSTOMY

## 2016-12-11 MED ORDER — AMOXICILLIN-POT CLAVULANATE 400-57 MG/5 ML PO SUSR
800 mg | Freq: Two times a day (BID) | ORAL | 0 refills | Status: DC
Start: 2016-12-11 — End: 2016-12-11

## 2016-12-11 NOTE — Progress Notes
CLINICAL NUTRITION                                                        Clinical Nutrition Follow-Up Summary     NAME:Javier Gutierrez             MRN: [id number]             DOB:Nov 09, 1940          AGE: 76 y.o.  ADMISSION DATE: 11/20/2016             DAYS ADMITTED: LOS: 21 days    Nutrition Assessment of Patient:  Malnutrition Assessment: Does not meet criteria  Current Oral Intake: NPO  Estimated Calorie Needs: 1700-1840 kcal (25-27 kcal/kg present wt 68kg)  Estimated Protein Needs: 82-88g (1.2-1.3g/kg present wt 68kg)  Oral Diet Order: NPO  Oral Supplement: Boost Plus, TID  Intake (calories) Daily Average : 739 kilocalories (kcal (43% goal) )  Intake (protein) Daily Average : 34 grams (grams (41% goal))  Current EN Order: Isosource 1.5 at 50 mL/hr x24 hours to provide 1800 kcal, 82 grams, and 912 mL free H2O.       Comments:  76 year old man with history of hypertension, hyperlipidemia, coronary artery disease, aortic stenosis, CKD, PAD, and lifetime smoker who presents with concern for weakness and cough. Per daughter he is reported to have a fall one week ago where he was down for 15 hours and generalized worsening fatigue over the last few weeks. Also with stage I coccyx pressure injury per RN documentation. Pt is being followed by clinical nutrition services for poor intake PTA & throughout admission. He is s/p SLP 10/30 who recommended NPO given concern for aspiration. However, cleared for regular textures 10/31 following VSS. PO intake was improving, though remained inadequate so corpak placed 11/10 (tip overlying the gastric antrum per imaging) & bolus feeds initiated. Pt pulled corpak 11/11; downgraded back to NPO 11/12 s/p SLP eval given concern for aspiration. S/p repeat VSS 11/13 which showed moderate/severe dysphagia. Pt/family decided on remaining NPO with PEG placement & ongoing SLP therapy. Updated SLP note from 11/19 still recommending NPO with use of PEG for nutrition.Pt is s/p PEG placement 11/15. Patient denies any intolerance of enteral nutrition. Feeds have been inadequate over the last 3 days, however intake so far today is meeting goal. RD will continue to follow to ensure adequate nutrition.     Recommendation:  ??? Continue EN as ordered.   ??? Advance diet as able per SLP.                                 Intervention / Plan:  Monitor EN provision/tolerance.   Monitor weight, GI function, labs, medications,   Monitor diet advancement per SLP     Nutrition Diagnosis:  Inadequate oral intake  Etiology: prior decreased appetite, dysphagia  Signs & Symptoms: son report, NPO status, s/p PEG placement for EN feeds    Goals:  Initiate nutrition  Time Frame: Within 48 Hours  Status: Met;new goal established     EN tolerated and meeting >75% of nutritional needs  Time Frame: Within 5 Days    Delice Bison, RD, LD  *[phone]

## 2016-12-11 NOTE — Progress Notes
.  I have reviewed the notes, assessment, and/or procedures performed by Madison Hess and concur with her/his documentation unless otherwise noted.

## 2016-12-11 NOTE — Progress Notes
Gastroenterology Progress Note  Patient Name:Javier Gutierrez         ZOX:[id number]  Admission Date: 11/20/2016  5:11 AM        Reason for consult:  Peg placement    Assessment:  76 yo F w/ PMHx of hypertension, hyperlipidemia, coronary artery disease, aortic stenosis, chronic kidney disease, peripheral artery disease, and lifetime smoker who presents with concern for weakness and cough. ???PEG placed on 11/15.   ???  # Bleeding around the PEG tube site  # hemoptysis  DDx: PEG tract bleeding, UGIB, versus swallowed blood from hemoptysis  - Hgb initially dropped 1.5 g, however, he has remained stable over the past three days w/o any melena or bleeding around or from the PEG tube  - a CTA of the chest showed hemorrhagic LLL PNA and a small R PE, but the primary team is not starting anticoagulation given his recent bleeding and the lung hemorrhage    Recommendations:  - please call us if the patient shows any signs of overt GI bleeding, or becomes hemodynamically unstable and drops his Hgb     We will sign off, please page [place] with any questions or concerns.    Patient discussed with attending on service, Dr. Jomarie Longs.    Renea Ee, MD  Gastroenterology & Hepatology Fellow  Pager [phone]      -----------------------------  HPI/Subjective:    The patient feels well, denies any fever/chills, SOB, chest pain, N/V, abd pain, dysuria. Continues to have a cough, but no more hemoptysis. Denies any melena or further bleeding around the PEG tube. No more bloody aspirates.    PMH:  Past Medical History:   Diagnosis Date   ??? Acute on chronic systolic heart failure, NYHA class 2 (HCC)    ??? Aortic valve stenosis, mild 11/29/2008   ??? Arterial occlusion     left kidney-two stents placed   ??? Arthritis     lower back   ??? Asthma    ??? CAD (coronary artery disease) 11/29/2008   ??? Cardiomyopathy (HCC) 11/29/2008   ??? Carotid artery plaque    ??? Colon polyps    ??? Frailty    ??? H/O: CVA (cardiovascular accident) 11/29/2008 ??? 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 HTN    ??? Hyperlipidemia    ??? Hyperlipidemia 11/29/2008   ??? Hypertension 11/29/2008   ??? Hypothyroidism    ??? Tobacco abuse      No current facility-administered medications on file prior to encounter.      Current Outpatient Prescriptions on File Prior to Encounter   Medication Sig Dispense Refill   ??? aspirin EC 81 mg tablet Take 1 Tab by mouth daily. Take with food. 90 Tab 3   ??? carvedilol (COREG) 25 mg tablet Take 1 tablet by mouth twice daily. Take with food. 180 tablet 3   ??? cilostazol(+) (PLETAL) 50 mg tablet Take 50 mg by mouth twice daily. Take on an empty stomach at least 30 minutes before or 2 hours after food.     ??? dutasteride (AVODART) 0.5 mg PO capsule Take 0.5 mg by mouth Daily.     ??? fish oil- omega 3-DHA/EPA 300/1,000 mg capsule Take 1 Cap by mouth twice daily.     ??? hydrALAZINE (APRESOLINE) 100 mg tablet TAKE 1 TABLET BY MOUTH THREE TIMES DAILY 90 tablet 3   ??? levothyroxine (SYNTHROID) 100 mcg tablet Take 1 tablet by mouth daily.     ??? montelukast (SINGULAIR) 10 mg  tablet TAKE ONE TABLET BY MOUTH ONCE DAILY 90 Tab 2     Past Surgical History:   Procedure Laterality Date   ??? HX LUMBAR DISKECTOMY  1968    30 years ago   ??? STENT INTRAVASCULAR  2006    kidney stent placed   ??? PERCUTANEOUS CORONARY INTERVENTION N/A 03/11/2015    Possible Percutaneous Coronary Intervention performed by Marcell Barlow, MD, [facility] at CATH LAB   ??? UPPER GASTROINTESTINAL ENDOSCOPY N/A 04/26/2015    ESOPHAGOGASTRODUODENOSCOPY performed by Tempie Hoist, DO at ENDO/GI   ??? UPPER GASTROINTESTINAL ENDOSCOPY  04/26/2015    ESOPHAGOGASTRODUODENOSCOPY BIOPSY performed by Tempie Hoist, DO at ENDO/GI   ??? AORTIC VALVE REPLACEMENT N/A 08/03/2015    REPLACEMENT TRANSCATHETER AORTIC VALVE (Sapien 26s3), right common femoral artery approach performed by Zella Richer, MD at [facility]   ??? BRONCHOSCOPY Bilateral 11/30/2016    BRONCHOSCOPY performed by Audie Box, MD at [facility] ??? BRONCHOSCOPY Right 11/30/2016    BRONCHOSCOPY WITH BRONCHIAL ALVEOLAR LAVAGE - FLEXIBLE performed by Audie Box, MD at [facility]   ??? UPPER GASTROINTESTINAL ENDOSCOPY N/A 12/06/2016    ESOPHAGOGASTRODUODENOSCOPY for PEG placement performed by Buckles, Vinnie Level, MD at ENDO/GI     Social History     Social History   ??? Marital status: Widowed     Spouse name: N/A   ??? Number of children: N/A   ??? Years of education: N/A     Occupational History   ??? Not on file.     Social History Main Topics   ??? Smoking status: Current Every Day Smoker     Packs/day: 0.50     Years: 60.00     Types: Cigarettes   ??? Smokeless tobacco: Never Used      Comment: 0.5-2 ppd history   ??? Alcohol use No   ??? Drug use: No   ??? Sexual activity: Not on file     Other Topics Concern   ??? Not on file     Social History Narrative   ??? No narrative on file     Family History   Problem Relation Age of Onset   ??? Stroke Mother    ??? Other Mother         colon cancer   ??? Other Father         valve disease/COPD       Physical Exam:  Vitals:    12/11/16 0038 12/11/16 0130 12/11/16 0728 12/11/16 1230   BP:  150/64 156/80 148/73   Pulse: 77 73 74 67   Temp:  36.4 ???C (97.5 ???F) 36.5 ???C (97.7 ???F) 36.5 ???C (97.7 ???F)   SpO2: 94% 94% 95% 97%   Weight:       Height:         General - Alert and oriented, no acute distress.   Head - Normocephalic, atraumatic.   Eyes -  EOMI grossly. No icterus or injection.   Oropharynx- No ulcer or bleeding, moist mucosa.  Neck - No swelling or tracheal deviation.   Pulmonary/Chest - Normal effort, normal breath sounds.  CV - Normal rate, regular rhythm, normal heart sounds and intact distal pulses.  Abd - crusted blood around the PEG, ND, soft, Non-TTP. No hepatospenomegaly. Normal bowel sounds. No masses palpable.  Extremities - Warm, dry.   Skin - No exposed rash, lesion.  Neurological - AAOx2, No gross deficit    Labs/Imaging:  Pertient labs/imaging were reviewed on initiation of progress  note.   24-hour labs: Results for orders placed or performed during the hospital encounter of 11/20/16 (from the past 24 hour(s))   CBC    Collection Time: 12/11/16  5:45 AM   Result Value Ref Range    White Blood Cells 9.4 4.5 - 11.0 K/UL    RBC 2.85 (L) 4.4 - 5.5 M/UL    Hemoglobin 8.9 (L) 13.5 - 16.5 GM/DL    Hematocrit 45.4 (L) 40 - 50 %    MCV 92.8 80 - 100 FL    MCH 31.3 26 - 34 PG    MCHC 33.8 32.0 - 36.0 G/DL    RDW 09.8 (H) 11 - 15 %    Platelet Count 300 150 - 400 K/UL    MPV 8.2 7 - 11 FL   BASIC METABOLIC PANEL    Collection Time: 12/11/16  5:45 AM   Result Value Ref Range    Sodium 140 137 - 147 MMOL/L    Potassium 3.7 3.5 - 5.1 MMOL/L    Chloride 112 (H) 98 - 110 MMOL/L    CO2 23 21 - 30 MMOL/L    Anion Gap 5 3 - 12    Glucose 103 (H) 70 - 100 MG/DL    Blood Urea Nitrogen 22 7 - 25 MG/DL    Creatinine 1.19 0.4 - 1.24 MG/DL    Calcium 8.1 (L) 8.5 - 10.6 MG/DL    eGFR Non African American >60 >60 mL/min    eGFR African American >60 >60 mL/min

## 2016-12-11 NOTE — Progress Notes
Name:  Javier Gutierrez                                               MRN:  [id number]   Admission Date:  11/20/2016  Today's Date: 12/10/16  LOS: 20      ASSESSMENT AND PLAN     76 year old man with history of hypertension, hyperlipidemia, coronary artery disease, aortic stenosis, chronic kidney disease, peripheral artery disease, and lifetime smoker who presents with concern for weakness and cough.  Found to have complicated empyema.  Chest tube was inserted.  Pulmonary treated the empyema with TPA/dornase.  CT surgery decided there is no surgical indication for decortication.  There was concern on the CT for possible obstruction pneumonia and infection disease recommended to have bronchoscopy.  ???  New onset of pulmonary embolism November 19  -Discussed with pulmonary team  -Agree with pulmonary team regarding anticoagulation.    Complicated parapneumonic effusion  - CT Chest 11/20/16: Consolidation of the majority of the left mid and lower lung, most compatible with pneumonia and partially loculated pleural effusion. Underlying neoplastic endobronchial lesion possible.  - LDH 293, white count 1400, pH of pleural fluid 7.29, cytology negative for malignant cells  - pleural fluid culture STREPTOCOCCUS INTERMEDIUS sensitive to PCN  - urine strep and leg Ag are negative, RVP negative  - s/p chest tube placement 11/20/16  - repeat CT 11/24/16 scan showed development of scattered opacities throughout the right lung that is most suggestive of pneumonia (new), Small, partially loculated left-sided hydropneumothorax with a pleural drain in place. ???The pleural fluid has mildly decreased and the pleural gas has developed since the October 30  - pulmonary following: Resume intrapleural t-PA/Dornase for 6 more doses, keep CT to -20cm wall suction when not clamped with t-pa or dornase, continue aggressive Pulmonary hygiene  - Nov 5: ID consulted recommended continue current antibiotics. - Nov 5 : CTS consulted. No surgical intervention   - Nov 5: CT showed Slight??? increase??? in??? mild??? patchy??? groundglass??? opacities??? throughout??? the??? right??? lung. Unchanged??? small??? right??? pleural??? effusion.  -Status post full course of Levaquin, vancomycin, and Zosyn  - Repeated chest CT by pulmonary on November 7 reviewed.  No new changes  - Bronchoscopy with BAL done on 11/9, cultures are negative  - Pulmonology pulled chest tube on 11/11  -Plan to continue Cefpodoxime with a stop date of December 17, 2016    Hemoptysis  -Patient had episode of hemoptysis on November 16 and November 17  -Bloody output was noted during measuring gastric residual on November 17  -Bleeding around the PEG tube was noted externally on November 17  -GI evaluated the patient on November 18 and  - recommended pulmonary evaluation for hemoptysis.  They do not think the external bleeding around the PEG tube is responsible for the internal bleeding which is most likely related to hemoptysis according to them.  -We will continue to hold anticoagulation for now and continue PPI IV   -Re consult pulmonary team on November 18      leucocytosis/ Sepsis resolving  - Echo was basically negative for any vegetation. Altho the study was difficult, in the setting of low clinical suspicion, no need for more testing.   - monitor cbc daily. procalcitonin and wbc are stable    Acute blood loss anemia.  Most likely related to hemorrhagic pneumonia in  the left lung Nov19  - due to chronic inflammation as per iron panel.   - transfused 1 unit pRBCs on 11/9  - cont PPI  -Transfused 2 units of blood on November 17  -f/u Hemoccult blood  - hold Lovenox  -Discussed with GI service.  No need for EGD.  They think the bleeding is mostly related to hemoptysis from the left lung.   ???  CAD  - Continue PTA Aspirin/COreg.  We will monitor overnight.  May hold if hemoptysis continues  - no chest pain  ???  Recent Mechanical fall  - CT head negative - CT C spine was suspicious for possible lytic lesions to the c spine  - Consulted PT/OT   ???  HLD/HTN  - He is of his simvastatin and fenofibrate due to reported muscle weakness since last Thursday Nov 1st  - Continue PTA coreg and hydralazine  ???  Hypothyroidism  - TSH/Free T4 normal  - Continue PTA synthroid  ???  Aortic Valve stenosis   - S/P TAVR in 2017  ???  Euvolemic hypoNa  -Resolved    Anorexia, mild protein cal malnutrition?   - multiple etiology   - could be related to antibiotics, current illness, anemia, disliking hospital food  -feeding through the PEG tube started on November 16    Dysphagia  - concern for aspiration pneumonia and empyema  - reviewed Speech therapy note from initial consultation, however patient continues to cough during feedings  - patient pulled out corpak last night and refused replacement  - consulted Speech to re-evaluated with video swallow study, recommend NPO  - GI consulted and placed PEG on 11/15, started tube feeds November 16    PT/OT recommneds inpatient setting- Inpatient Rehab consulted, planning for transfer to Rehab once stable on tube feeds      Ppx  - SCDs, hold Lovenox    Disp  - continue inpatient care.   Okay to resume tube feeding.  Will monitor hemoglobin and follow-up pulmonary recommendation    Total floor/unit time (reviewing and writing notes, examining the patient , reviewing test results etc) spent was 35 minutes of which > 50% was spent in care coordination  and bedside counseling (explaining treatment options, disease processes, laboratory/imaging results, prognosis, risks and benefits of treatment options, results of CT chest.  Benefit and risk of CTA chest.  Clinical update about discussion with GI).      SUBJECTIVE   Patient seen and examined.    Son was at bedside.  Explained to him that we are going to get chest CTA to rule out pulmonary embolism as well as fistula causing hemorrhage in the left lung.    OBJECTIVE     VITAL SIGNS BP: 156/66 (11/19 1935)  Temp: 36.4 ???C (97.6 ???F) (11/19 1935)  Pulse: 73 (11/19 1935)  Respirations: 18 PER MINUTE (11/19 1935)  SpO2: 98 % (11/19 1935)  O2 Delivery: None (Room Air) (11/19 1935)       PHYSICAL EXAM   General: Cachectic. VS as above. No acute distress . ??????  Throat: Stable  Lungs: ???No use of accessory muscles. ???Clear to auscultation bilaterally without wheezes, rales, or rhonchi.  Decrease aeration in the left lung  Cardiovascular: RRR, ???No edema in BLE.   Abdomen: ???BS+, soft, ND, ???no guarding or rigidity. ???Nontender to palpation. PEG in place.  Trace bleeding around the PEG tube site  Skin: Skin color normal, no obvious evidence of rashes.   Neurologic: Awake and alert and  oriented.  No focal finding.  Psych calm, normal affect    LAB REVIEW     Recent Labs      12/09/16   0325  12/09/16   2000  12/10/16   0617   WBC  15.6*   --   11.9*   HGB  8.7*  8.5*  8.4*   HCT  25.9*  25.7*  25.4*   PLTCT  321   --   328   MCV  92.0   --   93.2     Recent Labs      12/09/16   0325  12/10/16   0617   NA  141  141   K  3.5  3.7   CL  113*  112*   CO2  23  26   BUN  39*  29*   CR  0.84  0.79   GAP  5  3   GFR  >60  >60       MEDICATIONS   Scheduled Meds:    aspirin chewable tablet 81 mg 81 mg PEG Tube QDAY   carvedilol (COREG) tablet 12.5 mg 12.5 mg PEG Tube BID   cefpodoxime (VANTIN) tablet 200 mg 200 mg Oral Q12H*   cilostazol(+) (PLETAL) tablet 50 mg 50 mg Per NG tube BID before meals   doxazosin (CARDURA) tablet 1 mg 1 mg PEG Tube QDAY   levothyroxine (SYNTHROID) tablet 100 mcg 100 mcg PEG Tube QDAY   montelukast (SINGULAIR) tablet 10 mg 10 mg PEG Tube QDAY   pantoprazole (PROTONIX) injection 40 mg 40 mg Intravenous BID(11-21)   Continuous Infusions:    PRN and Respiratory Meds:acetaminophen Q4H PRN, albuterol Q4H PRN, dextromethorphan/guaiFENesin Q4H PRN, pancrelipase 20,000 Units/ sodium bicarbonate 650 mg(#) PRN (On Call from Rx), polyethylene glycol 3350 BID PRN, senna/docusate BID PRN RADIOLOGY AND OTHER DIAGNOSTIC PROCEDURES REVIEW     iology and [place]  Pertinent radiology reviewed.

## 2016-12-11 NOTE — Progress Notes
OCCUPATIONAL THERAPY  NO TREATMENT NOTE     The patient was not seen due to: currently off the unit in [place]. Will continue to follow.    Therapist: Theotis BurrowMegan , OTA [phone]  Date: 12/11/2016

## 2016-12-11 NOTE — Consults
PALLIATIVE CARE INPATIENT CONSULT NOTE     Date of Service: 12/11/16  Date of Admission: 11/20/2016 LOS: 21   Reason for Consult:  Risk for worsening PE and possible cardiac arrest. High risk for bleeding. Setting expectation.    ASSESSMENT/PLAN     Javier Gutierrez is a 76 y.o. male with hypertension, hyperlipidemia, coronary artery disease, aortic stenosis, CKD, PAD and lifetime smoker, admitted 21 days ago for concern with weakness and cough. Patient's hospital course has been complicated by acute respiratory failure, sepsis, shock, peg placed on 11/15. Palliative care was asked to see pt/family for support and assist with plan of care.    Physical Symptoms:  Weakness/Fatigue/ Deconditioning:  - continue PT/OT and caregiving support.    Psychosocial Issues:  Met with patient and his son-in-law at bedside. Patient alert and pleasant, able to answer simple questions. He gestured that on other questions that requires detailed answers, he asked to direct it to his family member in the room. Patient's son-in law requested to return when patient's daughter/DPOA is also here.     Met with patient's daughter/POA Javier Gutierrez. Introduced role of Palliative care as an extra layer of support to help with symptom management, communication with other teams- making sure they are getting all the information they need to make decisions, and coordination of care outside of the hospital if needed based on goals of care.     Javier Gutierrez/family displays good understanding of patient's condition as well as prognosis. She does have some disease-specific/treatment plan questions which we deferred to other teams to explain to pt/family. She asked about possible intervention if bleeding would continue and asked about treatment plan regarding pneumonia - deferred to primary/pulm/ ID.    Discussed that plan of continuing rehabilitation to be able to regain strength as much as possible sounds like a workable plan for patient/family. Patient's daughter verbalizes we want to be aggressive as much as possible but understanding that if an intervention would not be beneficial for him then it is reasonable to consider what could be done that would help with his comfort and quality of life.     TPOPP form was explained in detail by Dr. Mady Gemma MD.     Discussed about disease trajectory as there are a lot of uncertainties, daughter agrees that it seems like a roller coaster rides. She also verbalizes that they are hoping for the best outcome, but also preparing for the worst.  Discussed with Javier options that if plan A (continuing rehab) would have set backs, explained the options of revisiting goals with pt/family and may consider focusing on what patient wants/willing to do. Discussed that pt/family also has option to a comfort-directed approach with help of hospice care when appropriate. Javier Gutierrez is very familiar with palliative care in the community as well as hospice care as Javier job requires working close with them.        ADVANCE CARE PLANNING:   Code Status: DNAR - FI   DPOA: paperwork on file with [facility] - dtr Javier Gutierrez is his POA.   Living Will: none on file with [facility]   TPOPP: Discussed TPOPP, needs to be completed at discharge      Our team will continue to follow as patient's condition evolves.     Spiritual Issues: none identified    PRACTICAL & DISPOSITION PLANNING     Practical Issues:  Patient is participating well with PT/OT    Contacts:  Javier Gutierrez - Daughter - [phone]  Javier Gutierrez -  son - [phone]    Current PPS%:  40  Baseline PPS% (2 weeks prior to admit):  90    Prognosis (Estimated):  Based on current function, current medical issues, goals of care, and prognosis models, my estimated prognosis is Unknown.    Disposition: Continue current care based on goals    SUBJECTIVE     History of Present Illness: Javier Gutierrez is a 76 y.o. male with hypertension, hyperlipidemia, coronary artery disease, aortic stenosis, CKD, PAD and lifetime smoker, admitted 21 days ago for concern with weakness and cough. Patient's hospital course has been complicate acute respiratory failure, sepsis, shock, peg placed on 11/15, recent CTA of chest showed hemorrhagic LLL pneumonia and a small right PE.     Palliative care was asked to see pt/family for support and assist with plan of care.    Upon meeting patient, he is alert and pleasant, resting in chair. He just finished working with therapy and appears tired. He was able to answer simple questions. He denies pain, shortness of breath, chest pain, recent fever or chills.  Patient denies nausea, vomiting, constipation or diarrhea. Last bowel movement 11/20.     Son-in-law reported that prior to this hospitalization patient has been very active in the community, assisting other residents/friends in the independent living community where he lives.     Past Medical History:   Diagnosis Date   ??? Acute on chronic systolic heart failure, NYHA class 2 (HCC)    ??? Aortic valve stenosis, mild 11/29/2008   ??? Arterial occlusion     left kidney-two stents placed   ??? Arthritis     lower back   ??? Asthma    ??? CAD (coronary artery disease) 11/29/2008   ??? Cardiomyopathy (HCC) 11/29/2008   ??? Carotid artery plaque    ??? Colon polyps    ??? Frailty    ??? 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 HTN    ??? Hyperlipidemia    ??? Hyperlipidemia 11/29/2008   ??? Hypertension 11/29/2008   ??? Hypothyroidism    ??? Tobacco abuse      Past Surgical History:   Procedure Laterality Date   ??? HX LUMBAR DISKECTOMY  1968    30 years ago   ??? STENT INTRAVASCULAR  2006    kidney stent placed   ??? PERCUTANEOUS CORONARY INTERVENTION N/A 03/11/2015    Possible Percutaneous Coronary Intervention performed by Marcell Barlow, MD, [facility] at CATH LAB   ??? UPPER GASTROINTESTINAL ENDOSCOPY N/A 04/26/2015 ESOPHAGOGASTRODUODENOSCOPY performed by Tempie Hoist, DO at ENDO/GI   ??? UPPER GASTROINTESTINAL ENDOSCOPY  04/26/2015    ESOPHAGOGASTRODUODENOSCOPY BIOPSY performed by Tempie Hoist, DO at ENDO/GI   ??? AORTIC VALVE REPLACEMENT N/A 08/03/2015    REPLACEMENT TRANSCATHETER AORTIC VALVE (Sapien 26s3), right common femoral artery approach performed by Zella Richer, MD at [facility]   ??? BRONCHOSCOPY Bilateral 11/30/2016    BRONCHOSCOPY performed by Audie Box, MD at [facility]   ??? BRONCHOSCOPY Right 11/30/2016    BRONCHOSCOPY WITH BRONCHIAL ALVEOLAR LAVAGE - FLEXIBLE performed by Audie Box, MD at [facility]   ??? UPPER GASTROINTESTINAL ENDOSCOPY N/A 12/06/2016    ESOPHAGOGASTRODUODENOSCOPY for PEG placement performed by Buckles, Vinnie Level, MD at ENDO/GI       Social History   Substance Use Topics   ??? Smoking status: Current Every Day Smoker     Packs/day: 0.50     Years: 60.00  Types: Cigarettes   ??? Smokeless tobacco: Never Used      Comment: 0.5-2 ppd history   ??? Alcohol use No     Social History     Social History Narrative   ??? No narrative on file     Marital status: Widowed  Children: 1 daughter, 2 sons  Other significant loved ones: extended member of the family, grandchildren  Occupation: retired    Spiritual History: not addressed today    Family History   Problem Relation Age of Onset   ??? Stroke Mother    ??? Other Mother         colon cancer   ??? Other Father         valve disease/COPD     Family Status   Relation Status   ??? Mother Deceased   ??? Father Deceased       Review of Systems  A 14 point review of systems was negative except for: Respiratory: positive for cough or pneumonia  Gastrointestinal: positive for abdominal pain  Neurological: positive for weakness    Patient able to provide symptom assessment? Yes   Modified [place] Symptom Assessment Scale:  Patient Report, Family Report and Team Report   Time of initial assessment:  1000 Symptoms                           Assessment        Physical discomfort (past 3 days)? Mild        Is overall level of comfort acceptable to you?       acceptable        Pain rating (now)       None        Pain rating (past 3 days) Mild        Is pain control acceptable to you? acceptable        Activity level?  not active        How much does pain affect your activity?  mildly affects        Dyspnea (past 3 days)       Mild        Nausea (past 3 days)       None        Constipation (past 3 days)       None        When was last BM?  today        Are you on a regular bowel regimen?  Yes        Delirium Nursing Assessment:       DAT Assessment      Altered LOC:  0  Inattention:  1  Psychomotor Agitation:  0  Psychosis (Hallucinations/Delusions):  0  Overall DAT Score:  Score 0-1; No delirium    None        Anxiety (past 3 days)               Do you feel depressed?         Are you able to find joy in anything in life?  Yes        Level of fatigue (past 3 days) Moderate        Level of appetite (past 3 days)   none        Sensation of well being (past 3 days)? moderately good        Other  OBJECTIVE     Current Scheduled Medications  amoxicillin/K clavulanate (AUGMENTIN) oral suspension 800 mg 800 mg Oral BID   aspirin chewable tablet 81 mg 81 mg PEG Tube QDAY   carvedilol (COREG) tablet 12.5 mg 12.5 mg PEG Tube BID   doxazosin (CARDURA) tablet 1 mg 1 mg PEG Tube QDAY   levothyroxine (SYNTHROID) tablet 100 mcg 100 mcg PEG Tube QDAY   montelukast (SINGULAIR) tablet 10 mg 10 mg PEG Tube QDAY   pantoprazole (PROTONIX) injection 40 mg 40 mg Intravenous BID(11-21)        Continuous Infusions     PRN Medications  acetaminophen 650 mg Oral Q4H PRN   albuterol 2 puff Inhalation Q4H PRN   dextromethorphan/guaiFENesin 10 mL Oral Q4H PRN   pancrelipase 20,000 Units/ sodium bicarbonate 650 mg(#) 1 capsule Feeding Tube PRN (On Call from Rx)   polyethylene glycol 3350 1 packet PEG Tube BID PRN senna/docusate 10 mL PEG Tube BID PRN        Allergies: Sulfa (sulfonamide antibiotics)    Physical Exam  Blood pressure 156/80, pulse 74, temperature 36.5 ???C (97.7 ???F), height 177.8 cm (70), weight 68 kg (150 lb), SpO2 95 %.  General appearance:ill appearing elderly man, resting in chair, easily arousable  Eyes: no drainage, EOM intact, sclerae anicteric    Throat:  slight dry mucous membranes,    Lungs: shallow respiratory effort w decreased breath sounds, no accessory muscle use  Heart: regular rhythm, no murmurs noted  Abdomen:  Non-tender, soft, non-distended, normal bowel sounds, PEG in place .  Extremities:  no edema, MAEs   Neurologic: Easily arousable, able to answer simple questions only. Generalized weakness.      Psych: calm, cooperative, no agitation  Skin: warm and dry to touch. No rashes or lesions.      Lab Review & Radiology & Diagnostic Results    CT Chest 11/19  IMPRESSION  1. Small right lower lobe pulmonary embolism. This was discussed with the   patient's nurse, 2. Stable extensive left lower lobe consolidation with some higher density   areas suggesting hemorrhage, and scattered smaller patchy infiltrates in   both lungs, compatible with pneumonia.3. Stable scattered mild nodular and groundglass pulmonary infiltrates   compatible with atypical pneumonia.4. Persistent small left pleural effusion.  5. Stable mild mediastinal and hilar lymphadenopathy, likely reactive.  6. Persistent mild cardiomegaly with at least moderate coronary artery and   mitral annulus calcification, small pericardial effusion, and TAVR in   place. ???  Finalized by Golda Acre, M.D. on 12/10/2016 1:51 PM. Dictated by Golda Acre, M.D. on 12/10/2016 1:39 PM.    Doppler BLE 11/20  No femoral/popliteal deep venous thrombosis in either lower extremity.    Hematology:    Lab Results   Component Value Date    HGB 8.9 12/11/2016    HCT 26.5 12/11/2016    PLTCT 300 12/11/2016    WBC 9.4 12/11/2016    NEUT 72 12/05/2016 ANC 6.50 12/05/2016    ALC 1.30 12/05/2016    MONA 9 12/05/2016    AMC 0.80 12/05/2016    ABC 0.10 12/05/2016    MCV 92.8 12/11/2016    MCHC 33.8 12/11/2016    MPV 8.2 12/11/2016    RDW 18.2 12/11/2016    and General Chemistry:    Lab Results   Component Value Date    NA 140 12/11/2016    K 3.7 12/11/2016    CL 112 12/11/2016    GAP 5  12/11/2016    BUN 22 12/11/2016    CR 0.72 12/11/2016    GLU 103 12/11/2016    CA 8.1 12/11/2016    ALBUMIN 2.3 12/05/2016    MG 1.9 11/21/2016    TOTBILI 0.4 12/05/2016     Patient seen and care collaborated with Palliative care attending Dr. Mady Gemma MD.     Gustavus Messing  ANP-BC, [facility]  Palliative Care Nurse Practitioner  Available on [place]  Office: [phone]  Nights/Weekends - Page [phone] for Palliative Care On-Call

## 2016-12-11 NOTE — Case Management (ED)
Notified by Linus GalasKatie W. (SW) that primary team is anticipating the patient to be medically ready for dc tomorrow and OT to see patient 1st thing tomorrow morning for an updated therapy note. [facility] willl review case further for any other questions/concerns.    E.Draper Gallon, Inpatient Admissions Nurse/Rehab. (office# [phone] or voalte# [phone]).

## 2016-12-11 NOTE — Progress Notes
PHYSICAL THERAPY  PROGRESS NOTE       MOBILITY:  Mobility  Progressive Mobility Level: Walk in hallway  Distance Walked (feet): 150 ft  Level of Assistance: Assist X1  Assistive Device: Walker  Time Tolerated: 11-30 minutes  Activity Limited By: Fatigue    SUBJECTIVE:  Subjective  Significant hospital events: PMH significant for HTN, HLD, CAD, aortic stenosis s/p TAVR, CKD, PAD, lifetime smoker, chronic diastolic heart failure. Admitted 11/20/16 with weakness and cough. Found atelectasis and L PE with chest tube placed.   Mental / Cognitive Status: Alert;Cooperative;Follows Commands  Persons Present: Family  Pain: Patient has no complaint of pain  Pain Interventions: Patient agrees to participate in therapy  Comments: Room Air   Comments: PEG tube    Ambulation Assist: Independent Mobility in [employer] with Device  Patient Owned Equipment: Nurse, adult  Home Situation: Lives Alone  Type of Home: Apartment (independent living)  Entry Stairs: No Stairs  In-Home Stairs: No Stairs  Comments: patient plans to discharge to daughter's home post rehab for further assist/supervision as needed, as patient was active prior to admission and now requiring assist for safety. patient was active in coffee groups and currently unable to attend them. daughter plans to assist with cares- will be able to obtain assist post discharge.     BED MOBILITY/TRANSFERS:  Bed Mobility/Transfers  Comments: Sitting up in chair at start/end of session.   Transfer Type: Sit to/from Stand  Transfer: Assistance Level: To/From;Bed Side Chair;Toilet;Minimal Assist  Transfer: Assistive Device: Nurse, adult  Transfers: Type Of Assistance: For Strength Deficit;For Balance;Verbal Cues;For Safety Considerations    Other Transfer Type: Sit to/from Stand  Other Transfer: Assistance Level: To/From;Bed;Standby Assist;Minimal Assist (x5 reps)  Other Transfer: Assistive Device: Nurse, adult  Other Transfer: Type Of Assistance: For Strength Deficit;For Balance End Of Activity Status: Up in Chair;Nursing Notified;Instructed Patient to Request Assist with Mobility;Instructed Patient to Use Call Light  Comments: Completed sit <> stands from bed (varying heights). Required increased assist with lower surface. Patient sitting up in bedside chair with TABS alarm activated and call light within reach at end of session.     GAIT:  Gait  Gait Distance: 150 feet  Gait: Assistance Level:  (contact guard assist)  Gait: Assistive Device: Roller Walker  Gait: Descriptors: Pace: Slow;Decreased foot clearance RLE;Decreased heel strike RLE;Decreased step length (no loss of balance)  Comments: Demonstrates improved spped this date. Patient reporting fatigue requesting to return to room. Patient declining to ambulate second lap following seated rest break.   Activity Limited By: Complaint of Fatigue;Weakness    Comments: PT assisted patient to/from bathroom after ambulation/sit to stand practice. Did not use RW, requiring up to moderate assist due to instability and LOB. Patient unsafe to ambulate without use of assistive device at this time.     ACTIVITY/EXERCISE:  Activity / Exercise  Comments: x5 sit <> stands from varying heights.     EDUCATION:  Education  Comments: Encouraged patient to continue ambulation with nursing staff.     ASSESSMENT/PROGRESS:  Assessment/Progress  Impaired Mobility Due To: Decreased Strength;Impaired Balance;Decreased Activity Tolerance;Deconditioning  Assessment/Progress: Improving as Expected  Comments: Continues to demonstrate improvements in mobility; however, limited by fatigue and poor balance. PT requiring physical assistance with all mobility tasks. Would benefit from continued therapy to improve mobility independence.     AM-PAC 6 Clicks Basic Mobility Inpatient  Turning from your back to your side while in a flat bed without using bed rails: A Little  Moving  from lying on your back to sitting on the side of a flatbed without using bedrails : A Little  Moving to and from a bed to a chair (including a wheelchair): A Little  Standing up from a chair using your arms (e.g. wheelchair, or bedside chair): A Little  To walk in hospital room: A Little  Climbing 3-5 steps with a railing: A Little  Raw Score: 18  Standardized (T-scale) Score: 41.05  Basic Mobility CMS 0-100%: 40.47  CMS G Code Modifier for Basic Mobility: CK    GOALS:  Goals  Goal Formulation: With Patient/Family  Time For Goal Achievement: 5 days  Pt Will Go Supine To/From Sit: w/ Stand By Assist, Ongoing  Pt Will Transfer Bed/Chair: w/ Stand By Assist, Ongoing  Pt Will Transfer Sit to Stand: w/ Stand By Assist, Ongoing  Pt Will Ambulate: Greater than 200 Feet, w/ Walker, w/ Stand By Assist, Ongoing    PLAN:  Plan   Treatment Interventions: Mobility Training;Strengthening;Balance Activities;Endurance Training  Plan Frequency: 5 Days per Week  PT Plan for Next Visit: Progress ambulation, high level balance activities, short distance ambulation without RW.     RECOMMENDATIONS:  PT Discharge Recommendations  PT Discharge Recommendations: Inpatient Setting;Recommend Physical Medicine and Rehabilitation Consult to address most appropriate level of rehabilitation placement  Equipment Recommendations: Roller Walker. Patient requires the use of a walker with wheels to complete ADL???s in the home including meal preparation, ambulation the bathroom for toileting, bathing and grooming, and safe home mobility.  Patient is unable to complete these ADL???s with a cane or crutch and can safely use the walker.     Comments: Patient was independent without device prior to admission. demonstrating mobility gains, but still presents with functional deficits that could be better addressed in an inpatient rehab setting- do not anticipate patient will have difficulty tolerating- would actually benefit from further activity and overall routine.     Therapist: Charlsie Merles, PT, DPT [phone] Date: 12/11/2016

## 2016-12-11 NOTE — Patient Education
Medication Education    Javier Gutierrez accepted counseling and was inappropriate to counsel at this time.  Javier Gutierrez needs reinforcement.    The following medications were discussed:  Carvedilol, Cefpodoxime, Cilostazol, Doxazosin, Montelukast, Pantoprazole.    Where indicated, the patient was provided with additional medication and/or disease-state information.  All patient questions were answered and patient acknowledged understanding of the medications, side effects and other pertinent medication information.    Follow up should occur daily.    Continue to address: indications    Norwood LevoAdam , RN

## 2016-12-12 ENCOUNTER — Inpatient Hospital Stay: Admit: 2016-11-28 | Discharge: 2016-11-28 | Payer: MEDICARE

## 2016-12-12 ENCOUNTER — Inpatient Hospital Stay: Admit: 2016-11-21 | Discharge: 2016-11-21 | Payer: MEDICARE

## 2016-12-12 ENCOUNTER — Inpatient Hospital Stay: Admit: 2016-12-10 | Discharge: 2016-12-10 | Payer: MEDICARE

## 2016-12-12 ENCOUNTER — Inpatient Hospital Stay: Admit: 2016-11-26 | Discharge: 2016-11-26 | Payer: MEDICARE

## 2016-12-12 ENCOUNTER — Inpatient Hospital Stay: Admit: 2016-11-24 | Discharge: 2016-11-24 | Payer: MEDICARE

## 2016-12-12 ENCOUNTER — Inpatient Hospital Stay
Admit: 2016-11-20 | Discharge: 2016-12-12 | Disposition: A | Discharge status: Rehabilitation Facility | Payer: MEDICARE | Attending: Student in an Organized Health Care Education/Training Program

## 2016-12-12 ENCOUNTER — Inpatient Hospital Stay: Admit: 2016-12-06 | Discharge: 2016-12-06 | Payer: MEDICARE

## 2016-12-12 ENCOUNTER — Inpatient Hospital Stay: Admit: 2016-11-27 | Discharge: 2016-11-27 | Payer: MEDICARE

## 2016-12-12 ENCOUNTER — Emergency Department
Admit: 2016-11-20 | Discharge: 2016-11-20 | Payer: MEDICARE | Attending: Student in an Organized Health Care Education/Training Program

## 2016-12-12 ENCOUNTER — Inpatient Hospital Stay: Admit: 2016-12-02 | Discharge: 2016-12-02 | Payer: MEDICARE

## 2016-12-12 ENCOUNTER — Inpatient Hospital Stay: Admit: 2016-12-03 | Discharge: 2016-12-03 | Payer: MEDICARE

## 2016-12-12 ENCOUNTER — Inpatient Hospital Stay: Admit: 2016-12-05 | Discharge: 2016-12-05 | Payer: MEDICARE

## 2016-12-12 ENCOUNTER — Inpatient Hospital Stay: Admit: 2016-11-22 | Discharge: 2016-11-22 | Payer: MEDICARE

## 2016-12-12 DIAGNOSIS — R1312 Dysphagia, oropharyngeal phase: ICD-10-CM

## 2016-12-12 DIAGNOSIS — I2510 Atherosclerotic heart disease of native coronary artery without angina pectoris: ICD-10-CM

## 2016-12-12 DIAGNOSIS — J948 Other specified pleural conditions: ICD-10-CM

## 2016-12-12 DIAGNOSIS — J9601 Acute respiratory failure with hypoxia: ICD-10-CM

## 2016-12-12 DIAGNOSIS — F17210 Nicotine dependence, cigarettes, uncomplicated: ICD-10-CM

## 2016-12-12 DIAGNOSIS — Z66 Do not resuscitate: ICD-10-CM

## 2016-12-12 DIAGNOSIS — I429 Cardiomyopathy, unspecified: ICD-10-CM

## 2016-12-12 DIAGNOSIS — I2699 Other pulmonary embolism without acute cor pulmonale: ICD-10-CM

## 2016-12-12 DIAGNOSIS — E871 Hypo-osmolality and hyponatremia: ICD-10-CM

## 2016-12-12 DIAGNOSIS — J9811 Atelectasis: ICD-10-CM

## 2016-12-12 DIAGNOSIS — D62 Acute posthemorrhagic anemia: ICD-10-CM

## 2016-12-12 DIAGNOSIS — Z23 Encounter for immunization: ICD-10-CM

## 2016-12-12 DIAGNOSIS — J869 Pyothorax without fistula: ICD-10-CM

## 2016-12-12 DIAGNOSIS — B954 Other streptococcus as the cause of diseases classified elsewhere: ICD-10-CM

## 2016-12-12 DIAGNOSIS — K9421 Gastrostomy hemorrhage: ICD-10-CM

## 2016-12-12 DIAGNOSIS — I5032 Chronic diastolic (congestive) heart failure: ICD-10-CM

## 2016-12-12 DIAGNOSIS — I130 Hypertensive heart and chronic kidney disease with heart failure and stage 1 through stage 4 chronic kidney disease, or unspecified chronic kidney disease: ICD-10-CM

## 2016-12-12 DIAGNOSIS — D649 Anemia, unspecified: ICD-10-CM

## 2016-12-12 DIAGNOSIS — J918 Pleural effusion in other conditions classified elsewhere: ICD-10-CM

## 2016-12-12 DIAGNOSIS — J690 Pneumonitis due to inhalation of food and vomit: ICD-10-CM

## 2016-12-12 DIAGNOSIS — A419 Sepsis, unspecified organism: Principal | ICD-10-CM

## 2016-12-12 DIAGNOSIS — R042 Hemoptysis: ICD-10-CM

## 2016-12-12 DIAGNOSIS — R0489 Hemorrhage from other sites in respiratory passages: ICD-10-CM

## 2016-12-12 DIAGNOSIS — J850 Gangrene and necrosis of lung: ICD-10-CM

## 2016-12-12 DIAGNOSIS — N189 Chronic kidney disease, unspecified: ICD-10-CM

## 2016-12-12 DIAGNOSIS — E039 Hypothyroidism, unspecified: ICD-10-CM

## 2016-12-12 DIAGNOSIS — R5381 Other malaise: ICD-10-CM

## 2016-12-12 DIAGNOSIS — R54 Age-related physical debility: ICD-10-CM

## 2016-12-12 LAB — BASIC METABOLIC PANEL: Lab: 137 MMOL/L — ABNORMAL LOW (ref >60)

## 2016-12-12 LAB — CBC: Lab: 8.8 K/UL — ABNORMAL LOW (ref 4.5–11.0)

## 2016-12-12 MED ORDER — CARVEDILOL 12.5 MG PO TAB
12.5 mg | Freq: Two times a day (BID) | GASTROSTOMY | 0 refills | Status: DC
Start: 2016-12-12 — End: 2016-12-26
  Administered 2016-12-13 – 2016-12-26 (×28): 12.5 mg via GASTROSTOMY

## 2016-12-12 MED ORDER — AMOXICILLIN-POT CLAVULANATE 400-57 MG/5 ML PO SUSR
800 mg | Freq: Two times a day (BID) | GASTROSTOMY | 0 refills | Status: SS
Start: 2016-12-12 — End: 2016-12-26

## 2016-12-12 MED ORDER — DOXAZOSIN 2 MG PO TAB
1 mg | Freq: Every day | GASTROSTOMY | 0 refills | Status: DC
Start: 2016-12-12 — End: 2016-12-18
  Administered 2016-12-13 – 2016-12-18 (×6): 1 mg via GASTROSTOMY

## 2016-12-12 MED ORDER — BISACODYL 10 MG RE SUPP
10 mg | Freq: Every day | RECTAL | 0 refills | Status: DC | PRN
Start: 2016-12-12 — End: 2016-12-26

## 2016-12-12 MED ORDER — ALBUTEROL SULFATE 90 MCG/ACTUATION IN HFAA
2 | RESPIRATORY_TRACT | 0 refills | Status: DC | PRN
Start: 2016-12-12 — End: 2016-12-26

## 2016-12-12 MED ORDER — SENNA/DOCUSATE(#) 8.8/50MG/10ML PO SOLN
10 mL | Freq: Two times a day (BID) | GASTROSTOMY | 0 refills | Status: DC | PRN
Start: 2016-12-12 — End: 2016-12-13

## 2016-12-12 MED ORDER — MONTELUKAST 10 MG PO TAB
10 mg | Freq: Every day | GASTROSTOMY | 0 refills | Status: DC
Start: 2016-12-12 — End: 2016-12-26
  Administered 2016-12-13 – 2016-12-26 (×14): 10 mg via GASTROSTOMY

## 2016-12-12 MED ORDER — DEXTROMETHORPHAN-GUAIFENESIN 10-100 MG/5 ML PO SYRP
10 mL | GASTROSTOMY | 0 refills | Status: AC | PRN
Start: 2016-12-12 — End: 2017-04-16

## 2016-12-12 MED ORDER — DOCUSATE SODIUM 50 MG/5 ML PO LIQD
100 mg | Freq: Two times a day (BID) | GASTROSTOMY | 0 refills | Status: DC
Start: 2016-12-12 — End: 2016-12-26
  Administered 2016-12-13 – 2016-12-26 (×18): 100 mg via GASTROSTOMY

## 2016-12-12 MED ORDER — MAGNESIUM HYDROXIDE 2,400 MG/10 ML PO SUSP
10 mL | GASTROSTOMY | 0 refills | Status: DC | PRN
Start: 2016-12-12 — End: 2016-12-26
  Administered 2016-12-26: 15:00:00 10 mL via GASTROSTOMY

## 2016-12-12 MED ORDER — POLYETHYLENE GLYCOL 3350 17 GRAM PO PWPK
17 g | Freq: Two times a day (BID) | GASTROSTOMY | 0 refills | 22.00000 days | Status: AC | PRN
Start: 2016-12-12 — End: 2017-04-16

## 2016-12-12 MED ORDER — SENNOSIDES 8.6 MG PO TAB
2 | Freq: Every evening | GASTROSTOMY | 0 refills | Status: DC
Start: 2016-12-12 — End: 2016-12-26
  Administered 2016-12-13 – 2016-12-26 (×10): 2 via GASTROSTOMY

## 2016-12-12 MED ORDER — AMOXICILLIN-POT CLAVULANATE 400-57 MG/5 ML PO SUSR
800 mg | Freq: Two times a day (BID) | GASTROSTOMY | 0 refills | Status: CP
Start: 2016-12-12 — End: ?
  Administered 2016-12-13 – 2016-12-18 (×11): 800 mg via GASTROSTOMY

## 2016-12-12 MED ORDER — ACETAMINOPHEN 325 MG PO TAB
650 mg | GASTROSTOMY | 0 refills | Status: DC | PRN
Start: 2016-12-12 — End: 2016-12-26

## 2016-12-12 MED ORDER — DEXTROMETHORPHAN-GUAIFENESIN 10-100 MG/5 ML PO SYRP
10 mL | GASTROSTOMY | 0 refills | Status: DC | PRN
Start: 2016-12-12 — End: 2016-12-26

## 2016-12-12 MED ORDER — SENNA/DOCUSATE(#) 8.8/50MG/10ML PO SOLN
10 mL | Freq: Two times a day (BID) | GASTROSTOMY | 0 refills | Status: AC | PRN
Start: 2016-12-12 — End: 2017-04-16

## 2016-12-12 MED ORDER — PNEUMOCOCCAL 23-VAL PS VACCINE 25 MCG/0.5 ML IJ SOLN
.5 mL | Freq: Once | INTRAMUSCULAR | 0 refills | Status: CP
Start: 2016-12-12 — End: ?
  Administered 2016-12-12: 16:00:00 0.5 mL via INTRAMUSCULAR

## 2016-12-12 MED ORDER — DOXAZOSIN 1 MG PO TAB
1 mg | ORAL_TABLET | Freq: Every day | GASTROSTOMY | 0 refills | Status: SS
Start: 2016-12-12 — End: 2016-12-26

## 2016-12-12 MED ORDER — FLU VACC TS2018-19 65YR UP(PF) 180 MCG/0.5 ML IM SYRG
.5 mL | Freq: Once | INTRAMUSCULAR | 0 refills | Status: CP
Start: 2016-12-12 — End: ?
  Administered 2016-12-12: 17:00:00 0.5 mL via INTRAMUSCULAR

## 2016-12-12 MED ORDER — ALBUTEROL SULFATE 90 MCG/ACTUATION IN HFAA
2 | RESPIRATORY_TRACT | 0 refills | Status: SS | PRN
Start: 2016-12-12 — End: 2018-02-12

## 2016-12-12 MED ORDER — ASPIRIN 81 MG PO TBEC
81 mg | ORAL_TABLET | Freq: Every day | ORAL | 0 refills | Status: SS
Start: 2016-12-12 — End: 2016-12-26

## 2016-12-12 MED ORDER — HYDRALAZINE 25 MG PO TAB
25 mg | ORAL_TABLET | Freq: Three times a day (TID) | ORAL | 0 refills | Status: SS
Start: 2016-12-12 — End: 2016-12-26

## 2016-12-12 MED ORDER — ONDANSETRON HCL 4 MG PO TAB
4 mg | 0 refills | Status: DC | PRN
Start: 2016-12-12 — End: 2016-12-26

## 2016-12-12 MED ORDER — ~~LOC~~ CLOG DESTROYER(#) (ZENPEP 20,000 + SODIUM BICARB CAPSULE)
1 | GASTROSTOMY | 0 refills | Status: DC | PRN
Start: 2016-12-12 — End: 2016-12-26

## 2016-12-12 MED ORDER — CARVEDILOL 12.5 MG PO TAB
12.5 mg | Freq: Two times a day (BID) | ORAL | 0 refills | Status: SS
Start: 2016-12-12 — End: 2016-12-26

## 2016-12-12 MED ORDER — LEVOTHYROXINE 100 MCG PO TAB
100 ug | Freq: Every day | GASTROSTOMY | 0 refills | Status: DC
Start: 2016-12-12 — End: 2016-12-26
  Administered 2016-12-13 – 2016-12-26 (×14): 100 ug via GASTROSTOMY

## 2016-12-12 MED ORDER — POLYETHYLENE GLYCOL 3350 17 GRAM PO PWPK
1 | Freq: Two times a day (BID) | GASTROSTOMY | 0 refills | Status: DC | PRN
Start: 2016-12-12 — End: 2016-12-26
  Administered 2016-12-26: 03:00:00 17 g via GASTROSTOMY

## 2016-12-12 MED ORDER — SENNA/DOCUSATE(#) 8.8/50MG/10ML PO SOLN
10 mL | Freq: Two times a day (BID) | GASTROSTOMY | 0 refills | PRN
Start: 2016-12-12 — End: ?

## 2016-12-12 MED ORDER — DOCUSATE SODIUM 100 MG PO CAP
100 mg | Freq: Two times a day (BID) | ORAL | 0 refills | Status: DC
Start: 2016-12-12 — End: 2016-12-12

## 2016-12-12 MED ORDER — OMEPRAZOLE 2 MG/ML PO SUSR
20 mg | PACK | Freq: Every day | ORAL | 0 refills | Status: SS
Start: 2016-12-12 — End: 2016-12-26

## 2016-12-12 MED ORDER — PANTOPRAZOLE 40 MG IV SOLR
40 mg | Freq: Two times a day (BID) | INTRAVENOUS | 0 refills | Status: DC
Start: 2016-12-12 — End: 2016-12-13
  Administered 2016-12-13 (×2): 40 mg via INTRAVENOUS

## 2016-12-12 MED ORDER — ALUMINUM-MAGNESIUM HYDROXIDE 200-200 MG/5 ML PO SUSP
30 mL | GASTROSTOMY | 0 refills | Status: DC | PRN
Start: 2016-12-12 — End: 2016-12-26

## 2016-12-12 MED ADMIN — SODIUM CHLORIDE 0.9 % IJ SOLN [7319]: 10 mL | INTRAVENOUS | @ 03:00:00 | Stop: 2016-12-12 | NDC 00409488802

## 2016-12-12 NOTE — Progress Notes
Infectious Disease Progress Note    Name:  Javier Gutierrez   Javier Gutierrez Date:  12/12/2016    Assessment:     Javier Gutierrez is a 76 year old male with history of HTN,DL, hypothyroidism, RAS post left renal stent, CAD, Diastolic HF, Severe AS post TAVR in 07/2015, CKD,PAD, tobacco abuse admitted on 10/30 for CAP complicated by parapneumonic left pleural effusion and developed a new pneumonia.  ???  -New onset hemoptysis  -After PEG was placed on 11/15 patient developed bleeding from PEG tube + hemoptysis.  -Patient remained on room air  -Repeat CT chest showed intraparenchymal hemorrhage so CTA done and showed a new right sided acute PE   -discussion with family resulted in decision of no anticoagulation.  -Pulmonary and GI on board.  ???  ???  -CAP with complicated left parapneumonic pleural effusion  -Loculated Empyema  -patient admitted on 10/30 with 1 week history of progressive weakness, productive cough, progressive SOB.  -WBC was ???18400 on admission  -CT chest on 10/30 showed Consolidation of the majority of the left mid and lower lung, most compatible with pneumonia and partially loculated pleural effusion.Moderate sized partially loculated loculated left pleural effusion with areas of probable pleural thickening.  - post chest tube placement on 10/30 and tPA  -pleural fluid studies showed exudative fluid with pH=7.29, WBC=1400, glucose=24, culture grew strep intermedius sensitive to PCN, cytology negative.  -patient was started on VANC + Zosyn on 10/30  -Repeat CT chest 11/3 showed ???Small, partially loculated left-sided hydropneumothorax with a pleural drain in place with persistent left consolidation and new right lung infection.  -tPA was extended for 6 more doses.   -CTS consulted---> no surgical intervention.  -Bronchoscopy done mainly showed WBC=370 with 93% monocytes, gram stain with rare gram positive cocci.  ???  -Hospital-Acquired pneumonia  -On 11/3 CT chest repeat showed Small, partially loculated left-sided hydropneumothorax with a pleural drain in place + Progressive consolidation of the near entirety of the left lower lobe + ???Development of scattered opacities throughout the right lung that is most suggestive of pneumonia.  -patient with persistent leucocytosis despite 5 days of Zosyn and Vancomycin,levofloxacin added   -Repeat CT chest 11/5 showed Slight increase in mild patchy groundglass opacities throughout the   right lung.  -Procal=0.12  -Sputum gram stain no organisms seen.  ???  -CAD  ??? 03/11/15: Cardiac cath ( via Rt radial approach), 30%pLAD, 40%mLAD, 50-60%mLcx, 30-40%pRCA, 30%mRCA followed by 30% distal   ???  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  -Tobacco abuse  -31 pack-year smoking history.        Recommendations:     -will change Cefpodixime to Augmentin for 1 more week to get more anerobic coverage.   ???  Patient seen and discussed with Dr. Burman Blacksmith  ???    _____________________________________________________________________________  Interval History  Patient had no overnight fever, he is still having hemoptysis episodes. He denies chest pain, not requiring oxygen. He denies abdominal pain, no diarrhea. No hematuria.    Antimicrobial Start date End date   zosyn 10/30 11/16   vanco 10/30 11/14   Levo 11/3 11/7   vantin 11/16 ???11/20   ???Augmentin ???11/20 ???   ??? ??? ???   ??? ??? ???         Estimated Creatinine Clearance: 82.6 mL/min (based on SCr of 0.72 mg/dL).    Medications  Scheduled Meds:  amoxicillin/K clavulanate (  AUGMENTIN) oral suspension 800 mg 800 mg PEG Tube BID   carvedilol (COREG) tablet 12.5 mg 12.5 mg PEG Tube BID   doxazosin (CARDURA) tablet 1 mg 1 mg PEG Tube QDAY   levothyroxine (SYNTHROID) tablet 100 mcg 100 mcg PEG Tube QDAY   montelukast (SINGULAIR) tablet 10 mg 10 mg PEG Tube QDAY   pantoprazole (PROTONIX) injection 40 mg 40 mg Intravenous BID(11-21)   Continuous Infusions: PRN and Respiratory Meds:acetaminophen Q4H PRN, albuterol Q4H PRN, dextromethorphan/guaiFENesin Q4H PRN, pancrelipase 20,000 Units/ sodium bicarbonate 650 mg(#) PRN (On Call from Rx), polyethylene glycol 3350 BID PRN, senna/docusate BID PRN      Physical Examination                          Vital Signs: Last                  Vital Signs: 24 Hour Range   BP: 156/73 (11/21 1142)  Temp: 36.6 ???C (97.9 ???F) (11/21 1142)  Pulse: 63 (11/21 1142)  Respirations: 18 PER MINUTE (11/21 1142)  SpO2: 98 % (11/21 1142)  O2 Delivery: None (Room Air) (11/21 1142) BP: (148-171)/(71-74)   Temp:  [36.4 ???C (97.6 ???F)-36.8 ???C (98.3 ???F)]   Pulse:  [63-72]   Respirations:  [18 PER MINUTE]   SpO2:  [95 %-99 %]   O2 Delivery: None (Room Air)     General appearance:???alert, oriented, NAD  HENT: mucus membranes moist, no oral lesions/thrush  Eyes: PERRL, EOM grossly intact, Conj nl, pinpoint pupils  Neck: supple, no lymphadenopathy,   Lungs:???crackles on the left base, ronchi  Heart: Regular rhythm, reg rate, with no murmur, rub, gallop  Abdomen:???soft, non-tender, non-distended, normoactive bowel sounds, no hepatosplenomegaly, no masses  Ext:??????No clubbing, cyanosis or edema  Skin: no rashes/lesions      Laboratory   Hematology  Recent Labs      12/10/16   0617  12/11/16   0545  12/12/16   0639   WBC  11.9*  9.4  8.8   HGB  8.4*  8.9*  9.5*   HCT  25.4*  26.5*  29.0*   PLTCT  328  300  305     Chemistry  Recent Labs      12/10/16   0617  12/11/16   0545  12/12/16   0639   NA  141  140  137   K  3.7  3.7  4.1   CL  112*  112*  110   CO2  26  23  23    BUN  29*  22  20   CR  0.79  0.72  0.72   GFR  >60  >60  >60   GLU  125*  103*  116*   CA  7.9*  8.1*  8.2*       Microbiology, Radiology and other Diagnostics Review   Microbiology reviewed.    Pertinent radiology viewed.       Joesph Fillers, MD   Pager [phone]  Infectious Diseases Faculty

## 2016-12-12 NOTE — Progress Notes
Name:  Javier Gutierrez                                               MRN:  [id number]   Admission Date:  11/20/2016  Today's Date: 12/11/16  LOS: 21      ASSESSMENT AND PLAN     76 year old man with history of hypertension, hyperlipidemia, coronary artery disease, aortic stenosis, chronic kidney disease, peripheral artery disease, and lifetime smoker who presents with concern for weakness and cough.  Found to have complicated empyema.  Chest tube was inserted.  Pulmonary treated the empyema with TPA/dornase.  CT surgery decided there is no surgical indication for decortication.  There was concern on the CT for possible obstruction pneumonia and infection disease recommended to have bronchoscopy.  ???  New onset of pulmonary embolism November 19  -Discussed with pulmonary team  -Agree with pulmonary team regarding anticoagulation.  -Benefit and risk explained to the family including the daughter and the son-in-law  -Palliative team consulted on November 20.  Appreciate recommendation  -Explained to the family that patient has limited lung functional capacity and the pulmonary embolism may progress and may cause serious problems including cardiac arrest.  Explained as well that patient has acute blood loss anemia most likely because of necrotizing hemorrhagic pneumonia and he got 2 units of transfusion in the weekend recently.  Although the pneumonia may heal and hemoptysis may stop in the near future and at that time we can start anticoagulation for pulmonary embolism, it is hard to predict the future and what may happen.  -Updated the family that lower extremity Doppler is negative (ordered earlier on November 20)  -November 20, patient continued to have hemoptysis.  We will hold on antiplatelets for now    Hemoptysis, most likely related to necrotizing hemorrhagic pneumonia in the left lung  -Patient had episode of hemoptysis on November 16 and November 17 -Bloody output was noted during measuring gastric residual on November 17  -Bleeding around the PEG tube was noted externally on November 17  -GI evaluated the patient on November 18 and recommended pulmonary evaluation for hemoptysis.  They do not think the external bleeding around the PEG tube is responsible for the internal bleeding which is most likely related to hemoptysis according to them.  -We will continue to hold anticoagulation for now and continue PPI IV   -Re consult pulmonary team on November 18     Complicated parapneumonic effusion  - CT Chest 11/20/16: Consolidation of the majority of the left mid and lower lung, most compatible with pneumonia and partially loculated pleural effusion. Underlying neoplastic endobronchial lesion possible.  - LDH 293, white count 1400, pH of pleural fluid 7.29, cytology negative for malignant cells  - pleural fluid culture STREPTOCOCCUS INTERMEDIUS sensitive to PCN  - urine strep and leg Ag are negative, RVP negative  - s/p chest tube placement 11/20/16  - repeat CT 11/24/16 scan showed development of scattered opacities throughout the right lung that is most suggestive of pneumonia (new), Small, partially loculated left-sided hydropneumothorax with a pleural drain in place. ???The pleural fluid has mildly decreased and the pleural gas has developed since the October 30  - pulmonary following: Resume intrapleural t-PA/Dornase for 6 more doses, keep CT to -20cm wall suction when not clamped with t-pa or dornase, continue aggressive Pulmonary hygiene  - Nov 5:  ID consulted recommended continue current antibiotics.   - Nov 5 : CTS consulted. No surgical intervention   - Nov 5: CT showed Slight??? increase??? in??? mild??? patchy??? groundglass??? opacities??? throughout??? the??? right??? lung. Unchanged??? small??? right??? pleural??? effusion.  -Status post full course of Levaquin, vancomycin, and Zosyn  - Repeated chest CT by pulmonary on November 7 reviewed.  No new changes - Bronchoscopy with BAL done on 11/9, cultures are negative  - Pulmonology pulled chest tube on 11/11  -Plan to switch to Augmentin for 1 week.  Appreciate ID recommendation     leucocytosis/ Sepsis resolving  - Echo was basically negative for any vegetation. Altho the study was difficult, in the setting of low clinical suspicion, no need for more testing.   - monitor cbc daily. procalcitonin and wbc are stable    Acute blood loss anemia.  Most likely related to hemorrhagic pneumonia in the left lung Nov19  - due to chronic inflammation as per iron panel.   - transfused 1 unit pRBCs on 11/9  - cont PPI  -Transfused 2 units of blood on November 17  -f/u Hemoccult blood  - hold Lovenox  -Discussed with GI service.  No need for EGD.  They think the bleeding is mostly related to hemoptysis from the left lung.   ???  CAD  - Continue PTA COreg.  We will monitor overnight.  May hold if hemoptysis continues  - no chest pain  ???  Recent Mechanical fall  - CT head negative  - CT C spine was suspicious for possible lytic lesions to the c spine  - Consulted PT/OT   ???  HLD/HTN  - He is of his simvastatin and fenofibrate due to reported muscle weakness since last Thursday Nov 1st  - Continue PTA coreg    BPH and HTN  - cardura  ???  Hypothyroidism  - TSH/Free T4 normal  - Continue PTA synthroid  ???  Aortic Valve stenosis   - S/P TAVR in 2017  ???  Euvolemic hypoNa  -Resolved    Anorexia, mild protein cal malnutrition?   - multiple etiology   - could be related to antibiotics, current illness, anemia, disliking hospital food  -feeding through the PEG tube started on November 16    Dysphagia  - concern for aspiration pneumonia and empyema  - reviewed Speech therapy note from initial consultation, however patient continues to cough during feedings  - patient pulled out corpak last night and refused replacement  - consulted Speech to re-evaluated with video swallow study, recommend NPO - GI consulted and placed PEG on 11/15, started tube feeds November 16    PT/OT recommneds inpatient setting- Inpatient Rehab consulted, planning for transfer to Rehab once stable on tube feeds      Ppx  - SCDs, hold Lovenox    Disp  - continue inpatient care.  cont tube feeding.  Will monitor hemoglobin and follow-up pulmonary recommendation    Total floor/unit time (reviewing and writing notes, examining the patient , reviewing test results etc) spent was 35 minutes of which > 50% was spent in care coordination  and bedside counseling (explaining treatment options, disease processes, laboratory/imaging results, prognosis, risks and benefits of treatment options, results of CT chest.  Benefit and risk of CTA chest.  Clinical update about discussion with GI).      SUBJECTIVE   Patient seen and examined.    Daughter and son-in-law were at bedside.  Explained to them that we may have  to have set of expectation and clear plan prior to discharge.  Agreed to have discussion with palliative team.    OBJECTIVE     VITAL SIGNS   BP: 148/73 (11/20 1230)  Temp: 36.5 ???C (97.7 ???F) (11/20 1230)  Pulse: 67 (11/20 1230)  Respirations: 18 PER MINUTE (11/20 1230)  SpO2: 97 % (11/20 1230)  O2 Delivery: None (Room Air) (11/20 1230)       PHYSICAL EXAM   General: Cachectic. VS as above. No acute distress . ??????  Throat: Stable  Lungs: ???No use of accessory muscles. ???Clear to auscultation bilaterally without wheezes, rales, or rhonchi.  Decrease aeration in the left lung  Cardiovascular: RRR, ???No edema in BLE.   Abdomen: ???BS+, soft, ND, ???no guarding or rigidity. ???Nontender to palpation. PEG in place.  Trace bleeding around the PEG tube site  Skin: Skin color normal, no obvious evidence of rashes.   Neurologic: Awake and alert and oriented.  No focal finding.  Psych calm, normal affect    LAB REVIEW     Recent Labs      12/10/16   0617  12/11/16   0545   WBC  11.9*  9.4   HGB  8.4*  8.9*   HCT  25.4*  26.5*   PLTCT  328  300 MCV  93.2  92.8     Recent Labs      12/10/16   0617  12/11/16   0545   NA  141  140   K  3.7  3.7   CL  112*  112*   CO2  26  23   BUN  29*  22   CR  0.79  0.72   GAP  3  5   GFR  >60  >60       MEDICATIONS   Scheduled Meds:    amoxicillin/K clavulanate (AUGMENTIN) oral suspension 800 mg 800 mg PEG Tube BID   carvedilol (COREG) tablet 12.5 mg 12.5 mg PEG Tube BID   doxazosin (CARDURA) tablet 1 mg 1 mg PEG Tube QDAY   levothyroxine (SYNTHROID) tablet 100 mcg 100 mcg PEG Tube QDAY   montelukast (SINGULAIR) tablet 10 mg 10 mg PEG Tube QDAY   pantoprazole (PROTONIX) injection 40 mg 40 mg Intravenous BID(11-21)   Continuous Infusions:    PRN and Respiratory Meds:acetaminophen Q4H PRN, albuterol Q4H PRN, dextromethorphan/guaiFENesin Q4H PRN, pancrelipase 20,000 Units/ sodium bicarbonate 650 mg(#) PRN (On Call from Rx), polyethylene glycol 3350 BID PRN, senna/docusate BID PRN      RADIOLOGY AND OTHER DIAGNOSTIC PROCEDURES REVIEW     iology and Othe  Pertinent radiology reviewed.

## 2016-12-12 NOTE — Progress Notes
Palliative CARE note:    Spoke with Dr. Toya SmothersLauren Nugent from surgical trauma team.  Okay for palliative team to see the patient in consult tomorrow.  Dr. Estill BattenBen Skoch, palliative attending for the weekend, is aware.    Vickie EpleyNancy  APRN, AOCNS, [facility]CHPN  Palliative Care CNS  Available on VOALTE  Pager: [phone]  Nights/Weekends - Page [phone] for Palliative Care On-Call

## 2016-12-12 NOTE — Patient Education
Medication Education    Letta Moynahanhomas Palmero accepted counseling and was receptive.  he verbalized understanding.    The following medications were discussed:  augmentin  Coreg  singulair  protonix      Where indicated, the patient was provided with additional medication and/or disease-state information.  All patient questions were answered and patient acknowledged understanding of the medications, side effects and other pertinent medication information.    Patient educated on tube feedings, I&O, pressure ulcer prevention    Follow up should occur daily.    Continue to address: indications    Saintclair HalstedAlexandria LOOCK

## 2016-12-12 NOTE — Progress Notes
Javier Gutierrez discharged on 12/12/2016.   [name] Kitchen.  Discharge instructions reviewed with patient.  Valuables returned:   Personal Items / Valuables: Valuables/Belongings sent home with family/friends  Denture Type: Full upper, Full lower.  Home medications:    .  Functional assessment at discharge complete: Yes .      Report called to [facility] RN, per RN's request- PIVs left in place. EMS transported pt off unit to [facility] re[facility].

## 2016-12-12 NOTE — Progress Notes
OCCUPATIONAL THERAPY  PROGRESS NOTE    Patient Name: Javier Gutierrez                   Room/Bed: AV4098/11  Admitting Diagnosis:       Mobility  Progressive Mobility Level: Walk in hallway  Distance Walked (feet): 150 ft  Level of Assistance: Assist X1  Assistive Device: Walker  Time Tolerated: 11-30 minutes  Activity Limited By: Fatigue    Subjective  Pertinent Dx per Physician: PMH significant for HTN, HLD, CAD, aortic stenosis s/p TAVR, CKD, PAD, lifetime smoker, chronic diastolic heart failure. Admitted 11/20/16 with weakness and cough. Found atelectasis and L PE with chest tube placed.   Precautions: Falls  Pain Level Current: No pain    Objective  Persons Present: Family    Home Living  Type of Home: Apartment (independent living)  Home Layout: One Level  Financial risk analyst / Tub: Medical sales representative: Standard  Bathroom Equipment: Engineer, materials in [employer] Equipment: Environmental consultant (did not use prior to fall a week ago)    Prior Function  Level Of Independence: Independent with ADLs and functional transfers;Needed assistance with homemaking  Lives With: Alone  Receives Help From: Family  Other Function Comments: ILF does not provide meals. Pt's daughter states he eats out for breakfast and lunch and snacks the rest of the day. He has someone who comes to clean his apartment.    ADL's  Eating Assist: Total Assist (PEG, but hand to mouth WFL)  Grooming Assist: Moderate Assist  Grooming Deficits: Verbal Cueing;Setup;Supervision/Safety;Wash/Dry Hands;Denture Care  UE Dressing Assist: Minimal Assist  UE Dressing Deficits: Setup;Verbal Cueing;Pull Down In Back  LE Dressing Assist: Minimal Assist  LE Dressing Deficits: Increased Time To Complete;Verbal Cueing;Supervision/Safety (pants, socks, shoes)  Toileting Assist: Moderate Assist  Toileting Deficits: Clothing Management Up;Clothing Management Down;Perineal Hygiene  Functional Transfer Assist: Minimal Assist Functional Transfer Deficits: Verbal Cueing (hand placement to control descent into chair)  Comment: In addition to above ADL's, patient ambulates 150 ft using roller walker, minimal assist. Provided cues to keep head upright/neutral versus flexed forward.    Activity Tolerance  Endurance: 3/5 Tolerates 25-30 Minutes Exercise w/Multiple Rests  Sitting Balance: 5/5 Moves/Returns Trunkal Midpoint in All Planes > 2 Inches    Cognition  Overall Cognitive Status:  Dementia  Social Interaction: Encouragement/Coaxing to Participate    Assessment  Prognosis: Good;w/Cont OT s/p Acute Discharge    AM-PAC 6 Clicks Daily Activity Inpatient  Putting on and taking off regular lower body clothes?: A Little  Bathing (Including washing, rinsing, drying): A Lot  Toileting, which includes using toilet, bedpan, or urinal: A Lot  Putting on and taking off regular upper body clothing: A Little  Taking care of personal grooming such as brushing teeth: A Lot  Eating meals?: Total (PEG)  Daily Activity Raw Score: 13  Standardized (t-scale) score: 32.03  CMS 0-100% Score: 63.03  CMS G Code Modifier: CL    Plan  OT Frequency: 5x/week  OT Plan for Next Visit: full shower task, strength building, functional cognition      ADL Goals  Patient Will Perform Grooming: Standing at Sink;w/ Stand By Assist  Patient Will Perform LE Dressing: w/ Stand By Assist  Patient Will Perform Toileting: w/ Stand By Assist    Functional Transfer Goals  Pt Will Perform All Functional Transfers: w/ Stand By Assist        OT Discharge Recommendations  OT Discharge Recommendations: Inpatient Setting, To  address deficits, maximize function and improve safety.  Recommend ongoing assistance for: Transfers, Bed mobility, Ambulation, Stairs, Safety concerns, In and out of house        Therapist: Theotis Burrow, [place] [id number]  Date: 12/12/2016

## 2016-12-12 NOTE — Progress Notes
Infectious Disease Progress Note    Name:  Javier Gutierrez   [id number]'[id number] Date:  12/12/2016    Assessment:       Mr Javier Gutierrez is a 76 year old male with history of HTN,DL, hypothyroidism, RAS post left renal stent, CAD, Diastolic HF, Severe AS post TAVR in 07/2015, CKD,PAD, tobacco abuse admitted on 10/30 for CAP complicated by parapneumonic left pleural effusion and developed a new pneumonia.    -New onset hemoptysis  -After PEG was placed on 11/15 patient developed bleeding from PEG tube + hemoptysis.  -Patient remained on room air  -Repeat CT chest showed intraparenchymal hemorrhage so CTA done and showed a new right sided acute PE   -discussion with family resulted in decision of no anticoagulation.  -Pulmonary and GI on board.  ???  ???  -CAP with complicated left parapneumonic pleural effusion  -Loculated Empyema  -patient admitted on 10/30 with 1 week history of progressive weakness, productive cough, progressive SOB.  -WBC was ???18400 on admission  -CT chest on 10/30 showed Consolidation of the majority of the left mid and lower lung, most compatible with pneumonia and partially loculated pleural effusion.Moderate sized partially loculated loculated left pleural effusion with areas of probable pleural thickening.  - post chest tube placement on 10/30 and tPA  -pleural fluid studies showed exudative fluid with pH=7.29, WBC=1400, glucose=24, culture grew strep intermedius sensitive to PCN, cytology negative.  -patient was started on VANC + Zosyn on 10/30  -Repeat CT chest 11/3 showed ???Small, partially loculated left-sided hydropneumothorax with a pleural drain in place with persistent left consolidation and new right lung infection.  -tPA was extended for 6 more doses.   -CTS consulted---> no surgical intervention.  -Bronchoscopy done mainly showed WBC=370 with 93% monocytes, gram stain with rare gram positive cocci.  ???  -Hospital-Acquired pneumonia  -On 11/3 CT chest repeat showed Small, partially loculated left-sided hydropneumothorax with a pleural drain in place + Progressive consolidation of the near entirety of the left lower lobe + ???Development of scattered opacities throughout the right lung that is most suggestive of pneumonia.  -patient with persistent leucocytosis despite 5 days of Zosyn and Vancomycin,levofloxacin added   -Repeat CT chest 11/5 showed Slight increase in mild patchy groundglass opacities throughout the   right lung.  -Procal=0.12  -Sputum gram stain no organisms seen.  ???  -CAD  ??? 03/11/15: Cardiac cath ( via Rt radial approach), 30%pLAD, 40%mLAD, 50-60%mLcx, 30-40%pRCA, 30%mRCA followed by 30% distal   ???  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  -Tobacco abuse  -31 pack-year smoking history.        Recommendations:     Continue augmentin for 1 week then stop and observe  Monitor mild hemoptysis  _____________________________________________________________________________  Interval History     still has mild hemoptysis but not SOB   no chest pain  Denies N/V or diarrhea   no fever     no rash     labs     wbc ok   creat stable  Antimicrobial Start date End date   zosyn 10/30 11/16   vanco 10/30 11/14   Levo 11/3 11/7   vantin 11/16 ???11/20   augmentin 11/20 ???   ??? ??? ???   ??? ??? ???         Estimated Creatinine Clearance: 82.6 mL/min (based on SCr of 0.72 mg/dL).    Medications  Scheduled Meds:  amoxicillin/K clavulanate (AUGMENTIN) oral suspension 800 mg 800 mg PEG Tube BID   carvedilol (COREG) tablet 12.5 mg 12.5 mg PEG Tube BID   doxazosin (CARDURA) tablet 1 mg 1 mg PEG Tube QDAY   levothyroxine (SYNTHROID) tablet 100 mcg 100 mcg PEG Tube QDAY   montelukast (SINGULAIR) tablet 10 mg 10 mg PEG Tube QDAY   pantoprazole (PROTONIX) injection 40 mg 40 mg Intravenous BID(11-21)   Continuous Infusions:  PRN and Respiratory Meds:acetaminophen Q4H PRN, albuterol Q4H PRN, dextromethorphan/guaiFENesin Q4H PRN, pancrelipase 20,000 Units/ sodium bicarbonate 650 mg(#) PRN (On Call from Rx), polyethylene glycol 3350 BID PRN, senna/docusate BID PRN      Physical Examination                          Vital Signs: Last                  Vital Signs: 24 Hour Range   BP: 156/73 (11/21 1142)  Temp: 36.6 ???C (97.9 ???F) (11/21 1142)  Pulse: 63 (11/21 1142)  Respirations: 18 PER MINUTE (11/21 1142)  SpO2: 98 % (11/21 1142)  O2 Delivery: None (Room Air) (11/21 1142) BP: (148-171)/(71-74)   Temp:  [36.4 ???C (97.6 ???F)-36.8 ???C (98.3 ???F)]   Pulse:  [63-72]   Respirations:  [18 PER MINUTE]   SpO2:  [95 %-99 %]   O2 Delivery: None (Room Air)     General appearance:???alert, oriented, NAD  HENT: mucus membranes moist, no oral lesions/thrush  Eyes: PERRL, EOM grossly intact, Conj nl, pinpoint pupils  Lungs:???crackles on the left base, ronchi  Heart: Regular rhythm, reg rate, with no murmur, rub, gallop  Abdomen:???soft, non-tender, non-distended, normoactive bowel sounds, no hepatosplenomegaly, no masses  Ext:??????No clubbing, cyanosis or edema  Skin: no rashes/lesions      Laboratory   Hematology  Recent Labs      12/10/16   0617  12/11/16   0545  12/12/16   0639   WBC  11.9*  9.4  8.8   HGB  8.4*  8.9*  9.5*   HCT  25.4*  26.5*  29.0*   PLTCT  328  300  305     Chemistry  Recent Labs      12/10/16   0617  12/11/16   0545  12/12/16   0639   NA  141  140  137   K  3.7  3.7  4.1   CL  112*  112*  110   CO2  26  23  23    BUN  29*  22  20   CR  0.79  0.72  0.72   GFR  >60  >60  >60   GLU  125*  103*  116*   CA  7.9*  8.1*  8.2*       Microbiology, Radiology and other Diagnostics Review   Microbiology reviewed.    Pertinent radiology viewed.  Impression:   CTA chest:  . Small right lower lobe pulmonary embolism. This was discussed with the   patient's nurse, Javier Gutierrez by telephone at 1:49 PM on 12/10/2016.    2. Stable extensive left lower lobe consolidation with some higher density   areas suggesting hemorrhage, and scattered smaller patchy infiltrates in both lungs, compatible with pneumonia.    3. Stable scattered mild nodular and groundglass pulmonary infiltrates   compatible with atypical pneumonia.    4. Persistent small left pleural effusion.    5. Stable mild  mediastinal and hilar lymphadenopathy, likely reactive.    6. Persistent mild cardiomegaly with at least moderate coronary artery and   mitral annulus calcification, small pericardial effusion, and TAVR in   place. ???         Pasty Spillers, MD   Pager [phone]  Infect[facility] Diseases [facility]

## 2016-12-12 NOTE — Progress Notes
Name:  Javier Gutierrez                                               MRN:  [id number]   Admission Date:  11/20/2016  Today's Date: 12/12/16  LOS: 22      ASSESSMENT AND PLAN     76 year old man with history of hypertension, hyperlipidemia, coronary artery disease, aortic stenosis, chronic kidney disease, peripheral artery disease, and lifetime smoker who presents with concern for weakness and cough.  Found to have complicated empyema.  Chest tube was inserted.  Pulmonary treated the empyema with TPA/dornase.  CT surgery decided there is no surgical indication for decortication.  There was concern on the CT for possible obstruction pneumonia and infection disease recommended to have bronchoscopy.  ???  New onset of pulmonary embolism November 19  -Discussed with pulmonary team  -Agree with pulmonary team regarding anticoagulation.  -Benefit and risk explained to the family including the daughter and the son-in-law  -Palliative team consulted on November 20.  Appreciate recommendation  -Explained to the family that patient has limited lung functional capacity and the pulmonary embolism may progress and may cause serious problems including cardiac arrest.  Explained as well that patient has acute blood loss anemia most likely because of necrotizing hemorrhagic pneumonia and he got 2 units of transfusion in the weekend recently.  Although the pneumonia may heal and hemoptysis may stop in the near future and at that time we can start anticoagulation for pulmonary embolism, it is hard to predict the future and what may happen.  -Updated the family that lower extremity Doppler is negative (ordered earlier on November 20)  -November 20, patient continued to have hemoptysis.  We will hold on antiplatelets for now  -November 21: Discussed with pulmonary team the expected course of hemoptysis and when we may think the healing process will be completed.  It is expected that the hemoptysis may continue for couple of weeks before complete healing.  At that time, patient can be reevaluated and he may be a candidate for anticoagulation.  Discussed with the patient, daughter, palliative team, and son in law    Hemoptysis, most likely related to necrotizing hemorrhagic pneumonia in the left lung  -Patient had episode of hemoptysis on November 16 and November 17  -Bloody output was noted during measuring gastric residual on November 17  -Bleeding around the PEG tube was noted externally on November 17  -GI evaluated the patient on November 18 and recommended pulmonary evaluation for hemoptysis.  They do not think the external bleeding around the PEG tube is responsible for the internal bleeding which is most likely related to hemoptysis according to them.  -We will continue to hold anticoagulation for now and continue PPI IV   -Re consult pulmonary team on November 18     Complicated parapneumonic effusion  - CT Chest 11/20/16: Consolidation of the majority of the left mid and lower lung, most compatible with pneumonia and partially loculated pleural effusion. Underlying neoplastic endobronchial lesion possible.  - LDH 293, white count 1400, pH of pleural fluid 7.29, cytology negative for malignant cells  - pleural fluid culture STREPTOCOCCUS INTERMEDIUS sensitive to PCN  - urine strep and leg Ag are negative, RVP negative  - s/p chest tube placement 11/20/16  - repeat CT 11/24/16 scan showed development of scattered opacities throughout the  right lung that is most suggestive of pneumonia (new), Small, partially loculated left-sided hydropneumothorax with a pleural drain in place. ???The pleural fluid has mildly decreased and the pleural gas has developed since the October 30  - pulmonary following: Resume intrapleural t-PA/Dornase for 6 more doses, keep CT to -20cm wall suction when not clamped with t-pa or dornase, continue aggressive Pulmonary hygiene  - Nov 5: ID consulted recommended continue current antibiotics. - Nov 5 : CTS consulted. No surgical intervention   - Nov 5: CT showed Slight??? increase??? in??? mild??? patchy??? groundglass??? opacities??? throughout??? the??? right??? lung. Unchanged??? small??? right??? pleural??? effusion.  -Status post full course of Levaquin, vancomycin, and Zosyn  - Repeated chest CT by pulmonary on November 7 reviewed.  No new changes  - Bronchoscopy with BAL done on 11/9, cultures are negative  - Pulmonology pulled chest tube on 11/11  -Plan to switch to Augmentin for 1 week.  Appreciate ID recommendation     leucocytosis/ Sepsis resolving  - Echo was basically negative for any vegetation. Altho the study was difficult, in the setting of low clinical suspicion, no need for more testing.   - monitor cbc daily. procalcitonin and wbc are stable    Acute blood loss anemia.  Most likely related to hemorrhagic pneumonia in the left lung Nov19  - due to chronic inflammation as per iron panel.   - transfused 1 unit pRBCs on 11/9  - cont PPI  -Transfused 2 units of blood on November 17  -f/u Hemoccult blood  - hold Lovenox  -Discussed with GI service.  No need for EGD.  They think the bleeding is mostly related to hemoptysis from the left lung.   -Resolved  ???  CAD  - Continue PTA COreg.  We will monitor overnight.  May hold if hemoptysis continues  - no chest pain  ???  Recent Mechanical fall  - CT head negative  - CT C spine was suspicious for possible lytic lesions to the c spine  - Consulted PT/OT   ???  HLD/HTN  - He is of his simvastatin and fenofibrate due to reported muscle weakness since last Thursday Nov 1st  - Continue PTA coreg    BPH and HTN  - cardura  ???  Hypothyroidism  - TSH/Free T4 normal  - Continue PTA synthroid  ???  Aortic Valve stenosis   - S/P TAVR in 2017  ???  Euvolemic hypoNa  -Resolved    Anorexia, mild protein cal malnutrition?   - multiple etiology   - could be related to antibiotics, current illness, anemia, disliking hospital food  -feeding through the PEG tube started on November 16    Dysphagia - concern for aspiration pneumonia and empyema  - reviewed Speech therapy note from initial consultation, however patient continues to cough during feedings  - patient pulled out corpak last night and refused replacement  - consulted Speech to re-evaluated with video swallow study, recommend NPO  - GI consulted and placed PEG on 11/15, started tube feeds November 16    Patient confirmed that he is going to make an appointment with a primary care physician in 1 week of discharge.  He was educated about the importance of compliance with the follow-up.  He will be followed by pulmonary, palliative, and internal medicine during inpatient rehab.  I discussed his case with palliative team, pulmonary team to confirm that    The discharge process including patient education took at least 35 minutes.  SUBJECTIVE   Patient seen and examined.    Daughter and son-in-law were at bedside.  Explained to them that we may have to have set of expectation and clear plan prior to discharge.  They had discussion with palliative team and pulmonary team.     Daughter mentioned that he had some hemoptysis overnight    OBJECTIVE     VITAL SIGNS   BP: 171/74 (11/21 0723)  Temp: 36.5 ???C (97.7 ???F) (11/21 4034)  Pulse: 72 (11/21 0723)  Respirations: 18 PER MINUTE (11/21 0723)  SpO2: 99 % (11/21 0723)  O2 Delivery: None (Room Air) (11/21 7425)       PHYSICAL EXAM   General: Cachectic. VS as above. No acute distress . ??????  Throat: Stable  Lungs: ???No use of accessory muscles. ???Clear to auscultation bilaterally without wheezes, rales, or rhonchi.  Decrease aeration in the left lung  Cardiovascular: RRR, ???No edema in BLE.   Abdomen: ???BS+, soft, ND, ???no guarding or rigidity. ???Nontender to palpation. PEG in place.  No bleeding around the PEG tube site  Skin: Skin color normal, no obvious evidence of rashes.   Neurologic: Awake and alert and oriented.  No focal finding.  Psych calm, normal affect    LAB REVIEW     Recent Labs      12/11/16 0545  12/12/16   0639   WBC  9.4  8.8   HGB  8.9*  9.5*   HCT  26.5*  29.0*   PLTCT  300  305   MCV  92.8  94.7     Recent Labs      12/11/16   0545  12/12/16   0639   NA  140  137   K  3.7  4.1   CL  112*  110   CO2  23  23   BUN  22  20   CR  0.72  0.72   GAP  5  4   GFR  >60  >60       MEDICATIONS   Scheduled Meds:    amoxicillin/K clavulanate (AUGMENTIN) oral suspension 800 mg 800 mg PEG Tube BID   carvedilol (COREG) tablet 12.5 mg 12.5 mg PEG Tube BID   doxazosin (CARDURA) tablet 1 mg 1 mg PEG Tube QDAY   levothyroxine (SYNTHROID) tablet 100 mcg 100 mcg PEG Tube QDAY   montelukast (SINGULAIR) tablet 10 mg 10 mg PEG Tube QDAY   pantoprazole (PROTONIX) injection 40 mg 40 mg Intravenous BID(11-21)   Continuous Infusions:    PRN and Respiratory Meds:acetaminophen Q4H PRN, albuterol Q4H PRN, dextromethorphan/guaiFENesin Q4H PRN, pancrelipase 20,000 Units/ sodium bicarbonate 650 mg(#) PRN (On Call from Rx), polyethylene glycol 3350 BID PRN, senna/docusate BID PRN      RADIOLOGY AND OTHER DIAGNOSTIC PROCEDURES REVIEW     iology and Othe  Pertinent radiology reviewed.

## 2016-12-12 NOTE — Consults
Will follow up on 11/26 for continuity of care. Page with any questions over the weekend.

## 2016-12-12 NOTE — Care Coordination-Inpatient
As patient seen by IM today, pt will not been seen until tomorrow for consult.

## 2016-12-12 NOTE — Progress Notes
Karene Fryhomas G Eddinger discharged on 12/12/2016.   [name] Kitchen.  Discharge instructions reviewed with patient.  Valuables returned:   Personal Items / Valuables: Valuables/Belongings sent home with family/friends  Denture Type: Full upper, Full lower.  Home medications:    .  Functional assessment at discharge complete: Yes .

## 2016-12-12 NOTE — Progress Notes
RT Adult Assessment Note    NAME:Javier Gutierrez             MRN: [id number]             DOB:[birth year]          AGE: 76 y.o.  ADMISSION DATE: 12/12/2016             DAYS ADMITTED: LOS: 0 days    RT Treatment Plan:  Protocol Plan: Medications  Albuterol: MDI PRN         Additional Comments:  Impressions of the patient: a/o  Intervention(s)/outcome(s): none  Patient education that was completed: none  Recommendations to the care team: none    Vital Signs:  Pulse: Pulse: 68  RR: Respirations: 18 PER MINUTE  SpO2: SpO2: 96 %  O2 Device:    Liter Flow:    O2%:    Breath Sounds:    Respiratory Effort: Respiratory Effort: Non-Labored

## 2016-12-12 NOTE — Discharge Instructions - Appointments
F/u with PCP in 1 week.   F/u with pulm as needed.

## 2016-12-13 LAB — BASIC METABOLIC PANEL CELLULAR THERAPEUTICS: Lab: 134 MMOL/L — ABNORMAL LOW (ref >60)

## 2016-12-13 LAB — CBC CELLULAR THERAPEUTICS: Lab: 10 K/UL — ABNORMAL LOW (ref 4.5–11.0)

## 2016-12-13 MED ORDER — PANTOPRAZOLE(#) 2MG/ML PO SUSP
40 mg | Freq: Two times a day (BID) | GASTROSTOMY | 0 refills | Status: DC
Start: 2016-12-13 — End: 2016-12-26
  Administered 2016-12-13 – 2016-12-26 (×27): 40 mg via GASTROSTOMY

## 2016-12-13 MED ADMIN — SODIUM CHLORIDE 0.9 % IJ SOLN [7319]: 10 mL | INTRAVENOUS | @ 18:00:00 | Stop: 2016-12-13 | NDC 00409488801

## 2016-12-13 NOTE — Progress Notes
Heart Failure Nursing Progress Note    Admission Date: 12/12/2016  LOS: 1 day    Admission Weight: 60.5 kg (133 lb 6.1 oz)        Most recent weights (inpatient):   Vitals:    12/12/16 1550 12/13/16 0600   Weight: 60.5 kg (133 lb 6.1 oz) 61.9 kg (136 lb 6.4 oz)     Weight change from previous day: +1.4 kg    Fluid restriction ordered: none    Intake/Output Summary: (Last 24 hours)    Intake/Output Summary (Last 24 hours) at 12/13/16 0651  Last data filed at 12/12/16 1940   Gross per 24 hour   Intake               60 ml   Output              600 ml   Net             -540 ml       Is patient incontinent Yes.  Is brief scale being used?   Yes    Anticipated discharge date: TBD  Discharge goals: ambulate safely       Daily Assessment of Patient Stated Goals:    Short Term Goal Identified by patient (Short Term=during hospitalization):  Up to chair all meals

## 2016-12-13 NOTE — Progress Notes
Physical Medicine & Rehabilitation Progress Note       Today's Date:  12/13/2016  Admission Date: 12/12/2016  LOS: 1 day  Insurance:     Principal Problem:    Debility  Active Problems:    Hypertension    Hyperlipidemia    CAD (coronary artery disease)    Weight loss    Aortic stenosis    Stage 3 chronic kidney disease (HCC)    PAD (peripheral artery disease) (HCC)    Chronic diastolic (congestive) heart failure (HCC)    S/P TAVR (transcatheter aortic valve replacement)    Localized edema    Parapneumonic effusion    Impaired mobility    Pulmonary embolism (HCC)    Necrotizing pneumonia (HCC)                         Assessment/Plan:       Current diagnoses and medications reviewed.  Will continue on current medications.    Hgb 9.5 (11/21), 10 (11/22)    RLL PE.  Holding DVT ppx due to hemoptysis.  Discussed with patient and son the difficulty of ppx given his current PE (without known source as doppler of BLE was negative).  He had been ambulating with family across the street with use of gait belt and FWW.  We discussed with patient and son that there would be a fall risk with ambulation, but he could continue that progress as he had across the street.  Only with assistance!     Subjective     Javier Gutierrez is a 76 y.o. male.  Patient was seen in with resident physician in his room with his son.  No acute events reported overnight.  Patient denies any current shortness of breath, chest pain, nausea, vomiting, insomnia.  Still coughing up some blood, son thinks that it is slowing down some      Objective                        Vital Signs: Last Filed                 Vital Signs: 24 Hour Range   BP: 142/58 (11/22 0400)  Temp: 36.8 ???C (98.2 ???F) (11/22 0400)  Pulse: 68 (11/22 0400)  Respirations: 18 PER MINUTE (11/22 0400)  SpO2: 98 % (11/22 0400)  O2 Delivery: None (Room Air) (11/22 0400)  Height: 177.8 cm (70) (11/21 1550) BP: (125-170)/(58-73)   Temp:  [36.3 ???C (97.3 ???F)-36.8 ???C (98.2 ???F)]   Pulse:  [62-72] Respirations:  [18 PER MINUTE]   SpO2:  [93 %-98 %]   O2 Delivery: None (Room Air)     Vitals:    12/12/16 1550 12/13/16 0600   Weight: 60.5 kg (133 lb 6.1 oz) 61.9 kg (136 lb 6.4 oz)       Intake/Output Summary:  (Last 24 hours)    Intake/Output Summary (Last 24 hours) at 12/13/16 0735  Last data filed at 12/13/16 0729   Gross per 24 hour   Intake              740 ml   Output              600 ml   Net              140 ml                 Last Bowel Movement Date: 12/11/16  Physical Exam  VS: BP 142/58 (BP Source: Arm, Right Upper)  - Pulse 68  - Temp 36.8 ???C (98.2 ???F)  - Ht 177.8 cm (70)  - Wt 61.9 kg (136 lb 6.4 oz)  - SpO2 98%  - BMI 19.57 kg/m???   Gen: AOX3, NAD  HEENT: NCAT  Heart: RRR no MRG  Lungs: CTAB  Abd: S/NT/ND/+BS  Ext: No clubbing / No cyanosis / No edema / No calf pain  MS: Moves extremities spontaneously      Therapy Notes & Labs Reviewed.      ATTESTATION    I personally observed the resident performing the E/M, discussed case with resident, and concur with resident documentation of history, physical assessment and treatment plan unless otherwise noted.    Staff name:  Don Perking, MD Date:  12/13/2016

## 2016-12-13 NOTE — Consults
CLINICAL NUTRITION                                                        Clinical Nutrition Assessment Summary     NAME:Javier Gutierrez             MRN: [id number]             DOB:1940-05-19          AGE: 76 y.o.  ADMISSION DATE: 12/12/2016             DAYS ADMITTED: LOS: 1 day    Nutrition Assessment of Patient:  BMI Categories Adult: Acceptable: 18.5-24.9  Malnutrition Assessment:  (pending subjective info)  Current Oral Intake: NPO  Estimated Calorie Needs: 1700-1840 (25-27kcal/kg of acute admit wt 68kg)  Estimated Protein Needs: 82-88 (1.2-1.3g/kg of acute admit wt 68kg)  Oral Diet Order: NPO  Current EN Order: Isosource 1.5 @ 66ml/hr + 30ml water bolus Q4H. Provides 1800kcal, 82g protein, free water.    Comments:  Pt admitted to [facility] for debility 2/2 complicated parapneumonic effusion causing acute respiratory failure. PMH of HTN, HLD, CAD, aortic stenosis, CKD, PAD, and lifetime smoker. Received consult for TF recs. EN currently running continuously. Pt has been tolerating continuous EN well per previous RD note on 11/20 with EN goal being met the same day. However, goal was not met in recent 3-day EN avg (43% of kcal, 41% of protein). Pt had poor intake throughout acute stay and was determined at risk for aspiration by SLP 10/30, then cleared for Reg diet 10/31 per VSS. PO intake was improving, but remained inadequate and Corpak was placed 11/10. PEG was placed 11/15 s/p repeat VSS 11/13 showing mod-severe dysphagia. No gi distress noted. Last BM 11/20; bowel meds on board. Trace edema, no significant fluids at this time. Labs reviewed. Stage I pressure injury to coccyx remains (10/30). No significant weight loss PTA reported. However, now with concern for potential wt loss with rehab weight of 136# today, down from trend of 145-150# x 9 months through recent acute wt of 150# on 11/27/16. Will follow up with assessment for malnutrition and verify accuracy of weight.     Recommendation: ??? Modify to Bolus feeds: Isosource 1.5 x 5 cartons/day.   ??? Rec starting with 1 carton  ( ) x 5 per day to goal of 1.5 cartons ( ) TID + 1/2 carton ( ) as HS snack, as tolerated.   ??? Add minimum of 30ml water flush before and after bolus feeds + additional fluids per team (suggest q6h to meet baseline fluid needs of 1 ml/kcal).   ??? TF at goal will provide 1875kcal, 85g protein, free water (total H20 of 1843ml/day).   ??? For best toleration, infuse EN over 15-20 minutes.                           Intervention / Plan:  Bolus EN recs  Monitor EN rate/tolerance, progression of diet/SLP  Monitor wt, labs, skin, gi, meds, fluids    Nutrition Diagnosis:  Inadequate energy intake  Etiology: prior decreased appetite, dysphagia, inadequate TF.  Signs & Symptoms: documentation of I/Os, continued NPO with EN  Goals:  EN tolerated and meeting >85% of nutritional needs  Time Frame: Within 5 Days                Dorna Mai, RD, LD [phone]

## 2016-12-13 NOTE — Consults
Palliative care consult completed on 12/11/2016.     Patient briefly seen earlier  today.  No family member present at bedside. Patient alert and pleasant, resting chair. Chart reviewed, and  briefly discussed with IPR team/LIz that Palliative care will continue to follow patient for support, symptoms and evolving goals of care.  Pall care on-call physician will see patient as needed on holidays and weekends, please call if urgent needs arise.    Agrifina   ANP-BC, [facility]  Palliative Care Nurse Practitioner  Available on [place]  Office: [phone]  Nights/Weekends - Page [phone] for Palliative Care On-Call

## 2016-12-13 NOTE — Consults
General Consult Note      Admission Date: 12/12/2016                                                LOS: 1 day    Reason for Consult: Continuation of care    Consult type: Opinion with orders    Assessment/Plan    Javier Gutierrez is a 76 y.o. male with history of hypertension, hyperlipidemia, coronary artery disease, aortic stenosis, chronic kidney disease, peripheral artery disease, and lifetime smoker who was admitted to hospital 10/30 for weakness and cough.  Found to have complicated empyema S/P Chest tube IV antibiotics was transferred to rehab 11/21  , medicine were consulted for continuity of care     Complicated parapneumonic effusion pleural fluid culture STREPTOCOCCUS INTERMEDIUS sensitive to PCN Pulmonary treated the empyema with TPA/dornase.Infection disease was consulted and the patient was treated with full course of vancomycin, Levaquin.  Zosyn was continued and was switched later on to Cefpodoxime and on November 20 2 Augmentin for 1 more week after discharge.   Chronic kidney disease creatinine 0.73   hemoptysis S/P 2 units of transfusion on November 17.  CT chest showed necrotizing hemorrhagic pneumonia in the left side and small pulmonary embolism  Recent small PE Discussion with the family by pulmonary led to a decision to avoid anticoagulation due to risk of bleeding   Hypertension uncontrolled started on Cardura 1 mg daily   Coronary artery disease PTA Aspirin/COreg  Dysphagia on TF   Hypothyroidism  PTA synthroid 100  Anemia Hg 10 stable   Hx of aortic stenosis S/P AVR Sapien S3 bioprosthetic valve 2017   Lytic lesions cervical spine  on CT spine from 10/30 , indeterminate  needs follow up as outpatient     Plan :    - continue coreg 12.5 BID , home dose was 25 BID may increase dose if BP still elevated   - consider changing doxazosin dose to night to avoid orthostatic hypotension and help nocturia   - needs follow up CT chest as outpatient as underlying malignancy can not be ruled out , CT cervical spine 10/30 showed possible lytic lesions-not confirmed-  .  - continue ASA as the patient has bioprosthetic valve replacement , monitor hemoptysis .  - continue Augmentin until 11/27     Thanks for consult please call [phone] with questions       ______________________________________________________________________    History of Present Illness: Javier Gutierrez is a 76 y.o. male with history of hypertension, hyperlipidemia, coronary artery disease, aortic stenosis, chronic kidney disease, peripheral artery disease, and lifetime smoker who was admitted to hospital 10/30 for weakness and cough.  Found to have complicated empyema S/P Chest tube, Pulmonary treated the empyema with TPA/dornase.Infection disease was consulted and the patient was treated with full course of vancomycin, Levaquin.  Zosyn was continued and was switched later on to Cefpodoxime and on November 20 2 Augmentin for 1 more week after discharge.   Patient started having hemoptysis despite the above course and got 2 units of transfusion on November 17.  CT chest showed necrotizing hemorrhagic pneumonia in the left side and small pulmonary embolism on the right side.  Pulmonary was reconsulted on November 18.  Discussion with the family led to a decision to avoid anticoagulation .  the patient had dysphagia and PEG  tube placed and he was discharged to rehab 11/21    Since admit to rehab , the patient is doing well , he complains of cough with one time blood tinged sputum , was able to walk with family , tolerated tube feeding well     Past Medical History:   Diagnosis Date   ??? Acute on chronic systolic heart failure, NYHA class 2 (HCC)    ??? Aortic valve stenosis, mild 11/29/2008   ??? Arterial occlusion     left kidney-two stents placed   ??? Arthritis     lower back   ??? Asthma    ??? CAD (coronary artery disease) 11/29/2008   ??? Cardiomyopathy (HCC) 11/29/2008   ??? Carotid artery plaque    ??? Colon polyps    ??? Frailty ??? 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 HTN    ??? Hyperlipidemia    ??? Hyperlipidemia 11/29/2008   ??? Hypertension 11/29/2008   ??? Hypothyroidism    ??? Tobacco abuse      Past Surgical History:   Procedure Laterality Date   ??? HX LUMBAR DISKECTOMY  1968    30 years ago   ??? STENT INTRAVASCULAR  2006    kidney stent placed   ??? PERCUTANEOUS CORONARY INTERVENTION N/A 03/11/2015    Possible Percutaneous Coronary Intervention performed by Marcell Barlow, MD, [facility] at CATH LAB   ??? UPPER GASTROINTESTINAL ENDOSCOPY N/A 04/26/2015    ESOPHAGOGASTRODUODENOSCOPY performed by Tempie Hoist, DO at ENDO/GI   ??? UPPER GASTROINTESTINAL ENDOSCOPY  04/26/2015    ESOPHAGOGASTRODUODENOSCOPY BIOPSY performed by Tempie Hoist, DO at ENDO/GI   ??? AORTIC VALVE REPLACEMENT N/A 08/03/2015    REPLACEMENT TRANSCATHETER AORTIC VALVE (Sapien 26s3), right common femoral artery approach performed by Zella Richer, MD at [facility]   ??? BRONCHOSCOPY Bilateral 11/30/2016    BRONCHOSCOPY performed by Audie Box, MD at [facility]   ??? BRONCHOSCOPY Right 11/30/2016    BRONCHOSCOPY WITH BRONCHIAL ALVEOLAR LAVAGE - FLEXIBLE performed by Audie Box, MD at [facility]   ??? UPPER GASTROINTESTINAL ENDOSCOPY N/A 12/06/2016    ESOPHAGOGASTRODUODENOSCOPY for PEG placement performed by Buckles, Vinnie Level, MD at ENDO/GI     Social History     Social History   ??? Marital status: Widowed     Spouse name: N/A   ??? Number of children: N/A   ??? Years of education: N/A     Social History Main Topics   ??? Smoking status: Current Every Day Smoker     Packs/day: 0.50     Years: 60.00     Types: Cigarettes   ??? Smokeless tobacco: Never Used      Comment: 0.5-2 ppd history   ??? Alcohol use No   ??? Drug use: No   ??? Sexual activity: Not on file     Other Topics Concern   ??? Not on file     Social History Narrative   ??? No narrative on file     Family History   Problem Relation Age of Onset   ??? Stroke Mother    ??? Other Mother colon cancer   ??? Other Father         valve disease/COPD     Allergies:  Sulfa (sulfonamide antibiotics)    Scheduled Meds:  amoxicillin/K clavulanate (AUGMENTIN) oral suspension 800 mg 800 mg PEG Tube BID   carvedilol (COREG) tablet 12.5 mg 12.5 mg PEG Tube BID  docusate (COLACE) oral solution 100 mg 100 mg PEG Tube BID   doxazosin (CARDURA) tablet 1 mg 1 mg PEG Tube QDAY   levothyroxine (SYNTHROID) tablet 100 mcg 100 mcg PEG Tube QDAY   montelukast (SINGULAIR) tablet 10 mg 10 mg PEG Tube QDAY   pantoprazole (PROTONIX) injection 40 mg 40 mg Intravenous BID(11-21)   senna (SENOKOT) tablet 2 tablet 2 tablet PEG Tube QHS   Continuous Infusions:  PRN and Respiratory Meds:acetaminophen Q4H PRN, albuterol Q4H PRN, aluminum/magnesium hydroxide Q4H PRN, bisacodyl QDAY PRN, dextromethorphan/guaiFENesin Q4H PRN, milk of magnesia (CONC) Q4H PRN, ondansetron Q6H PRN, pancrelipase 20,000 Units/ sodium bicarbonate 650 mg(#) PRN (On Call from Rx), polyethylene glycol 3350 BID PRN    Review of Systems:  Constitutional: negative  Eyes: negative  Ears, nose, mouth, throat, and face: negative  Respiratory: cough ,   Cardiovascular: negative  Gastrointestinal: negative  Genitourinary: difficulty urinating   Integument/breast: negative  Hematologic/lymphatic: negative  Musculoskeletal:negative  Neurological: weakness   Behavioral/Psych: negative    Vital Signs:  Last Filed in 24 hours Vital Signs:  24 hour Range    BP: 161/78 (11/22 0855)  Temp: 36.8 ???C (98.2 ???F) (11/22 0400)  Pulse: 71 (11/22 0855)  Respirations: 18 PER MINUTE (11/22 0400)  SpO2: 98 % (11/22 0400)  O2 Delivery: None (Room Air) (11/22 0400)  Height: 177.8 cm (70) (11/21 1550) BP: (125-170)/(58-78)   Temp:  [36.3 ???C (97.3 ???F)-36.8 ???C (98.2 ???F)]   Pulse:  [62-72]   Respirations:  [18 PER MINUTE]   SpO2:  [93 %-98 %]   O2 Delivery: None (Room Air)     Physical Exam:  Gen: alert and oriented, cooperative and no distress looks fatigued Head: Normocephalic, without obvious abnormality, atraumatic   Eyes: conjunctivae/corneas clear. PERRL, EOM's intact,   Throat: Lips, mucosa, and tongue normal. Teeth and gums normal   Neck: supple, symmetrical, trachea midline, no adenopathy, no JVD, no bruits,   Back: symmetric, no curvature. ROM normal. No CVA tenderness.   Lungs: clear to auscultation bilaterally, no wheezes, rales, rhonchi   Heart: regular rate and rhythm, S1, S2 normal, no murmur, click, rub or gallop   Abdomen:  PEG tube in place soft, non-tender. Bowel sounds normal. No masses, no organomegaly, non-distended   Extremities: pulses palpable, no pedal edema or skin lesions   Neurologic: Grossly normal Alert and oriented X 3, normal strength and tone.   Skin: Skin color, texture, turgor normal. No rashes or lesions   Lymph nodes: Cervical, supraclavicular nodes normal.      Lab/Radiology/Other Diagnostic Tests:  24-hour labs:    Results for orders placed or performed during the hospital encounter of 12/12/16 (from the past 24 hour(s))   CBC CELLULAR THERAPEUTICS    Collection Time: 12/13/16  7:21 AM   Result Value Ref Range    White Blood Cells 10.0 4.5 - 11.0 K/UL    RBC 3.25 (L) 4.4 - 5.5 M/UL    Hemoglobin 10.0 (L) 13.5 - 16.5 GM/DL    Hematocrit 69.6 (L) 40 - 50 %    MCV 93.8 80 - 100 FL    MCH 30.6 26 - 34 PG    MCHC 32.6 32.0 - 36.0 G/DL    RDW 29.5 (H) 11 - 15 %    Platelet Count 277 150 - 400 K/UL    MPV 8.6 7 - 11 FL   BASIC METABOLIC PANEL CELLULAR THERAPEUTICS    Collection Time: 12/13/16  7:21 AM   Result Value  Ref Range    Sodium 134 (L) 137 - 147 MMOL/L    Potassium 4.3 3.5 - 5.1 MMOL/L    Chloride 105 98 - 110 MMOL/L    CO2 23 21 - 30 MMOL/L    Anion Gap 6 3 - 12    Glucose 120 (H) 70 - 100 MG/DL    Blood Urea Nitrogen 18 7 - 25 MG/DL    Creatinine 1.61 0.4 - 1.24 MG/DL    Calcium 8.4 (L) 8.5 - 10.6 MG/DL    eGFR Non African American >60 >60 mL/min    eGFR African American >60 >60 mL/min     Pertinent radiology reviewed. Mammie Russian, MD  Pager [phone]

## 2016-12-13 NOTE — Discharge Instructions - Pharmacy
Physician Discharge Summary      Name: Javier Gutierrez  Medical Record Number: [id number]        Account Number:  [id number]  Date Of Birth:  12/10/1940                         Age:  76 years   Admit date:  11/20/2016                     Discharge date:  12/12/2016    Attending Physician:  Lulu Riding, MD                   Service: Med Private [id number]    Physician Summary completed by: Kaylamarie Swickard Jake Shark, MD    Reason for hospitalization: Weakness and shortness of breath    Significant PMH:   Past Medical History:   Diagnosis Date   ??? Acute on chronic systolic heart failure, NYHA class 2 (HCC)    ??? Aortic valve stenosis, mild 11/29/2008   ??? Arterial occlusion     left kidney-two stents placed   ??? Arthritis     lower back   ??? Asthma    ??? CAD (coronary artery disease) 11/29/2008   ??? Cardiomyopathy (HCC) 11/29/2008   ??? Carotid artery plaque    ??? Colon polyps    ??? Frailty    ??? 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 HTN    ??? Hyperlipidemia    ??? Hyperlipidemia 11/29/2008   ??? Hypertension 11/29/2008   ??? Hypothyroidism    ??? Tobacco abuse           Allergies: Sulfa (sulfonamide antibiotics)    Admission Physical Exam notable for:     General:  Alert, cooperative, no distress, appears stated age  Head:  Normocephalic, without obvious abnormality, atraumatic  Eyes:  Conjunctivae/corneas clear.  PERRL, EOMs intact.  Fundi benign  Throat:  Lips, mucosa and tongue normal.  Edentulous with dentures in place  Neck:  Supple, symmetrical, trachea midline, no adenopathy, thyroid: no enlargement/tenderness/nodules, no carotid bruit and no JVD  Lungs:  Scattered Rhonchi  Heart:    Regular rate and rhythm, S1, S2 normal, no murmur, click rub or gallop  Abdomen:  Soft, non-tender.  Bowel sounds normal.  No masses.  No organomegaly.  Extremities: + 1 pitting edema in B/L lower Extremities   Peripheral pulses:   2+ and symmetric, all extremities Admission Lab/Radiology studies notable for:    Result Value Ref Range   ??? White Blood Cells 18.4 (H) 4.5 - 11.0 K/UL   ??? RBC 3.11 (L) 4.4 - 5.5 M/UL   ??? Hemoglobin 9.4 (L) 13.5 - 16.5 GM/DL   ??? Hematocrit 27.5 (L) 40 - 50 %   ??? MCV 88.4 80 - 100 FL   ??? MCH 30.1 26 - 34 PG   ??? MCHC 34.1 32.0 - 36.0 G/DL   ??? RDW 15.4 (H) 11 - 15 %   ??? Platelet Count 554 (H) 150 - 400 K/UL   ??? MPV 7.0 7 - 11 FL   ??? Neutrophils 85 (H) 41 - 77 %   ??? Lymphocytes 5 (L) 24 - 44 %   ??? Monocytes 9 4 - 12 %   ??? Eosinophils 1 0 - 5 %   ??? Basophils 0 0 - 2 %   ??? Absolute  Neutrophil Count 15.60 (H) 1.8 - 7.0 K/UL   ??? Absolute Lymph Count 0.90 (L) 1.0 - 4.8 K/UL   ??? Absolute Monocyte Count 1.70 (H) 0 - 0.80 K/UL   ??? Absolute Eosinophil Count 0.10 0 - 0.45 K/UL   ??? Absolute Basophil Count 0.00 0 - 0.20 K/UL   COMPREHENSIVE METABOLIC PANEL   ??? Collection Time: 11/20/16  5:22 AM   Result Value Ref Range   ??? Sodium 130 (L) 137 - 147 MMOL/L   ??? Potassium 4.2 3.5 - 5.1 MMOL/L   ??? Chloride 101 98 - 110 MMOL/L   ??? Glucose 101 (H) 70 - 100 MG/DL   ??? Blood Urea Nitrogen 25 7 - 25 MG/DL   ??? Creatinine 0.97 0.4 - 1.24 MG/DL   ??? Calcium 8.1 (L) 8.5 - 10.6 MG/DL   ??? Total Protein 5.4 (L) 6.0 - 8.0 G/DL   ??? Total Bilirubin 0.5 0.3 - 1.2 MG/DL   ??? Albumin 2.2 (L) 3.5 - 5.0 G/DL   ??? Alk Phosphatase 42 25 - 110 U/L   ??? AST (SGOT) 19 7 - 40 U/L   ??? CO2 22 21 - 30 MMOL/L   ??? ALT (SGPT) 9 7 - 56 U/L   ??? Anion Gap 7 3 - 12   ??? eGFR Non African American >60 >60 mL/min   ??? eGFR African American >60 >60 mL/min   MAGNESIUM       Brief Hospital Course:  The patient was admitted and the following issues were addressed during this hospitalization: (with pertinent details).       76 year old man with history of hypertension, hyperlipidemia, coronary artery disease, aortic stenosis, chronic kidney disease, peripheral artery disease, and lifetime smoker who presents with concern for weakness and cough.  Found to have complicated empyema.  Chest tube was inserted Oct 30.  Pulmonary treated the empyema with prolonged TPA/dornase course.  CT surgery decided there is no surgical indication for decortication.  There was concern on the CT for possible obstruction pneumonia and infection disease recommended to have bronchoscopy, which was basically negative.  Chest tube was removed on November 11.  Infection disease was consulted and the patient was treated with full course of vancomycin, Levaquin.  Zosyn was continued and was switched later on to Cefpodoxime and on November 20 2 Augmentin for 1 more week after discharge.     Patient started having hemoptysis despite the above course and got 2 units of transfusion on November 17.  CT chest showed necrotizing hemorrhagic pneumonia in the left side and small pulmonary embolism on the right side.  Pulmonary was reconsulted on November 18.  Discussion with the family led to a decision to avoid anticoagulation for now.  The pneumonia will be followed up and may heal in a few weeks after discharge.  Once hemoptysis resolved, anticoagulation should be addressed.  Patient was on cilostazol and aspirin for coronary artery disease and peripheral artery disease.  We discontinued cilostazol and we decided to hold aspirin as long as hemoptysis continues.  Hemoglobin has to be followed.  Aspirin can be continued if hemoglobin is a stable.  Benefit and risk should be addressed frequently.  PPI should be continued.  Lower extremity Doppler was negative for DVT on November 20    Because of current illness, patient was found to have dysphagia with concerns for aspiration which could be a risk factor for delayed healing of his pneumonia.  Speech therapy evaluated the patient.  The recommendation after  video swallow study was to keep him n.p.o.  Patient had PEG tube placed on November 15 with a normal EGD.  He had drop in hemoglobin after the procedure and there was bleeding around the PEG tube site.  GI was reconsulted.  GI was not impressed by the bleeding around the PEG tube site and recommended follow-up.  Bleeding stopped later on    Inpatient rehab wanted pulmonary, internal medicine, palliative team to follow with the patient during rehabilitation time.    Condition at Discharge: Stable    Discharge Diagnoses:       Hospital Problems        Active Problems    * (Principal)Parapneumonic effusion    Tobacco use disorder    CAD (coronary artery disease)    Chronic diastolic (congestive) heart failure (HCC)    Pneumonia    Acute respiratory failure with hypoxia (HCC)    Hyponatremia       Resolved Problems    RESOLVED: Sepsis (HCC)          Surgical Procedures: None    Significant Diagnostic Studies and Procedures:   Swallow Motion Series    Result Date: 12/04/2016    1. Silent aspiration with thin consistency barium. 2. Please see separately dictated report from the Department of Speech Pathology for further description.       Swallow Motion Series    Result Date: 11/21/2016    Aspiration of thin barium with swallowing via straw.     Cta Chest Wo/w Contrast+post Impression    Result Date: 12/10/2016    1. Small right lower lobe pulmonary embolism. This was discussed with the patient's nurse, Morrie Sheldon by telephone at 1:49 PM on 12/10/2016. 2. Stable extensive left lower lobe consolidation with some higher density areas suggesting hemorrhage, and scattered smaller patchy infiltrates in both lungs, compatible with pneumonia. 3. Stable scattered mild nodular and groundglass pulmonary infiltrates compatible with atypical pneumonia. 4. Persistent small left pleural effusion. 5. Stable mild mediastinal and hilar lymphadenopathy, likely reactive. 6. Persistent mild cardiomegaly with at least moderate coronary artery and mitral annulus calcification, small pericardial effusion, and TAVR in place.     Ct Head Wo Contrast    Result Date: 11/20/2016 Head: 1.  No acute intracranial hemorrhage or calvarial fracture. 2.  Old bilateral basal ganglia lacunar type infarcts and bilateral cerebellar infarcts. 3.  Mild generalized volume loss and moderate nonspecific white matter disease, likely due to chronic microvascular ischemia. 4.  Right greater than left mastoid effusions. Cervical Spine: 1.  No evidence of acute cervical fracture or subluxation. 2.  Cervical spondylosis with multilevel neural foraminal narrowing, greatest of a severe degree on the left at C3-C4, bilaterally C5-C6, and C6-C7. Mild spinal canal narrowing at C7-T1. 3.  Partially visualized irregular left apical lung mass. Further evaluation with dedicated CT of the chest is recommended. 4.  Scattered lytic lesions throughout the cervical spine, indeterminate though suspicious for metastatic disease given left lung mass.     Ct Chest Wo Contrast    Result Date: 12/10/2016    1. Persistent extensive consolidation within the left lower lobe, compatible with pneumonia, with development of areas of intraparenchymal hemorrhage, which is of uncertain etiology and may be related to pneumonia. However, CTA of the pulmonary arteries should be considered to evaluate for pulmonary artery pseudoaneurysm. Follow-up CT chest in 2-3 months is also recommended to confirm resolution of consolidation and exclude an underlying neoplasm. 2. Additional patchy nodular and groundglass opacities throughout both lungs  have slightly improved and are also likely infectious. 3. Decrease in size of a trace left pleural effusion and resolution of right pleural effusion. 4. Mild mediastinal adenopathy, likely reactive.    Ct Chest Wo Contrast    Result Date: 11/28/2016    1. Indwelling left thoracostomy tube with unchanged partially loculated left hydropneumothorax. 2. Persistent lower lobe consolidations, left greater than right, again likely reflecting pneumonia. 3. Unchanged mild patchy groundglass opacities throughout the right lung, left reflecting atypical pneumonia. 4. Persistent small right pleural effusion.      Ct Chest Wo Contrast    Result Date: 11/26/2016    1. Indwelling left thoracostomy tube with improvement in the partially loculated left hydropneumothorax. 2. Persistent lower lobe consolidation, left greater than right, likely pneumonia. 3. Slight increase in mild patchy groundglass opacities throughout the right lung. 4. Unchanged small right pleural effusion.      Ct Chest Wo Contrast    Result Date: 11/24/2016    1.  Small, partially loculated left-sided hydropneumothorax with a pleural drain in place.  The pleural fluid has mildly decreased and the pleural gas has developed since the November 20, 2016 CT chest. 2.  Progressive consolidation of the near entirety of the left lower lobe 3.  Development of scattered opacities throughout the right lung that is most suggestive of pneumonia. 4.  Slight increase in size of the still small right pleural effusion. 5.  Mild increase in size of the still small pericardial effusion.     Ct Chest Wo Contrast    Result Date: 11/20/2016    1. Consolidation of the majority of the left mid and lower lung, most compatible with pneumonia and partially loculated pleural effusion. Underlying neoplastic endobronchial lesion possible, recommend follow-up CT or PET/CT after appropriate medical therapy.  Secretions within the trachea, suspicious for aspiration. 2. Moderate sized partially loculated loculated left pleural effusion with areas of probable pleural thickening, may be inflammatory though metastatic pleural disease could appear similar 3. Mild mediastinal and probable hilar adenopathy. 4. Calcific coronary artery disease.     Ct Spine Cervical Wo Contrast    Result Date: 11/20/2016    Head: 1.  No acute intracranial hemorrhage or calvarial fracture. 2.  Old bilateral basal ganglia lacunar type infarcts and bilateral cerebellar infarcts. 3.  Mild generalized volume loss and moderate nonspecific white matter disease, likely due to chronic microvascular ischemia. 4.  Right greater than left mastoid effusions. Cervical Spine: 1.  No evidence of acute cervical fracture or subluxation. 2.  Cervical spondylosis with multilevel neural foraminal narrowing, greatest of a severe degree on the left at C3-C4, bilaterally C5-C6, and C6-C7. Mild spinal canal narrowing at C7-T1. 3.  Partially visualized irregular left apical lung mass. Further evaluation with dedicated CT of the chest is recommended. 4.  Scattered lytic lesions throughout the cervical spine, indeterminate though suspicious for metastatic disease given left lung mass.     Abdomen Ap Only    Result Date: 12/02/2016    Enteric feeding tube in place the tip overlying the gastric antrum.     Chest Single View    Result Date: 12/07/2016    1. Stable left basal consolidation with volume loss and patchy right upper lobe opacities likely on the basis of aspiration and/or infection. Follow-up radiographs suggested. 2. Small left pleural effusion.      Chest Single View    Result Date: 12/03/2016    Interval removal of left thoracostomy tube with no evidence of new pneumothorax.  Unchanged consolidation volume loss in the left lower lobe with partially loculated left pleural effusion.    Chest Single View    Result Date: 12/02/2016    1.  Left chest tube remains in place with persistent left hydropneumothorax and adjacent consolidation. 2.  Persistent right lung patchy opacities. 3.  Interval placement of an enteric tube.    Chest Single View    Result Date: 11/27/2016    No significant change in left basilar empyema and lower lobe pneumonia, with pleural drainage catheter in place. .     Chest Single View    Result Date: 11/22/2016    1.  Slight decrease in size of now moderate partially loculated left pleural effusion with adjacent left basilar consolidation. 2.  Small lucencies overlying the lateral left lung may represent small  hydropneumothorax.      Chest Single View    Result Date: 11/22/2016    1. Stable left greater than right pleural effusions. 2. Unchanged left mid and lower lung zone opacities which may reflect adjacent infection. 3. Indistinct vasculature suggests interstitial edema.     Chest Single View    Result Date: 11/21/2016    1.  Indwelling left thoracostomy tube without pneumothorax. 2.  No significant change in the partially loculated left pleural effusion and extensive left lung consolidation.     Chest Single View    Result Date: 11/20/2016  CHEST SINGLE VIEW Clinical history: COUGH. Comparison: Chest radiograph 08/04/2015 Findings: The cardiac silhouette is partially obscured by consolidation. There has been previous TAVR. There has been development of vascular congestion. A moderate, partially loculated left pleural effusion is present, with extensive consolidation within the left mid and lower lung. No right pleural effusion is identified. No pneumothorax is seen. Impression : Development of a moderate, partially loculated left pleural effusion with extensive consolidation in the left lung base, likely pneumonia.     Chest 2 Views    Result Date: 12/03/2016    Persistent consolidation of the left lower lobe associated with partially loculated left pleural effusion. The findings remain consistent with pneumonia and either parapneumonic effusion or empyema on the left. Patchy parenchymal opacities in the right upper lobe as well as a small right pleural effusion.     US Doppler Venous Bilateral    Result Date: 12/11/2016    No femoral/popliteal deep venous thrombosis in either lower extremity.    2-d + Doppler Echocardiogram    Result Date: 11/27/2016  ??? Left Ventricle: Normal size. Concentric remodeling. Normal ejection fraction. ??? EF 65 % ??? Right Ventricle: Normal size and ejection fraction. ??? Aortic Valve: There is a 29 mm Sapien S3 bioprosthetic valve present. Well-seated. Leaflets not well visualized. Peak velocity of 2.1 m/s, mean gradient of 8 mmHg. No regurgitation. Unchanged compared to prior study. ??? Pericardium: Small pericardial effusion (relatively newer compared to prior study). ??? Valves leaflets were not well visualized. No hemodynamic significant regurgitation or stenosis.  Technically difficult study.     Line Plcmt 1v Cxr    Result Date: 11/20/2016    Placement of a left pleural catheter without evidence of pneumothorax. No significant change in size of a partially loculated left pleural fluid collection and adjacent consolidation.      Consults:  GI, ID, Palliative Care and Pulmonary    Patient Disposition: [facility]       Patient instructions/medications:     CBC and Diff   Standing Status: Future  Standing Exp. Date: 12/12/17  Please fax results to PCP Steva Ready .   (Do NOT send results to myself: Uzoma Vivona Alahmad).     Comprehensive Metabolic Panel (CMP)   Standing Status: Future  Standing Exp. Date: 12/12/17   Please fax results to PCP Steva Ready .   (Do NOT send results to myself: Demitrios Molyneux Alahmad).     Other Diet   NPO  If you have questions about your diet after you go home, you can call a dietitian at [phone].     Activity as Tolerated   It is important to keep increasing your activity level after you leave the hospital.  Moving around can help prevent blood clots, lung infection (pneumonia) and other problems.  Gradually increasing the number of times you are up moving around will help you return to your normal activity level more quickly.  Continue to increase the number of times you are up to the chair and walking daily to return to your normal activity level. Begin to work toward your normal activity level at discharge     CONSULT PULMONARY/CRITICAL CARE PHYSICIAN   Follow up at rehab requested Ordering Provider's or Responsible Teams's Pager #? IP rehab wants pulm team to follow    Does patient have external medical records, including radiographic studies? No      CONSULT ADULT PALLIATIVE CARE PROVIDER   IP rehab wants palliative team to follow   Consult Type: Co-Management w/Signed Orders    Ordering Provider's or Responsible Teams's Pager #? IP rehab wants palliative team to follow    Does patient have external medical records, including radiographic studies? No      CONSULT INTERNAL MEDICINE PHYSICIAN   IP rehab wants IM team to follow   Ordering Provider's or Responsible Teams's Pager #? IP rehab wants IM team to follow      Report These Signs and Symptoms   Please contact your doctor if you have any of the following symptoms: temperature higher than 100 degrees F, uncontrolled pain, persistent nausea and/or vomiting, difficulty breathing, chest pain, severe abdominal pain, headache, unable to urinate, unable to have bowel movement or drainage with a foul odor     Questions About Your Stay   For questions or concerns regarding your hospital stay. Call [phone]   Discharging attending physician: Felecia Jan, [facility] [phone]      Tube Feeding   Current EN Order: Isosource 1.5 at 50 mL/hr x24 hours to provide 1800 kcal, 82 grams, and 912 mL free H2O.       Home Care Instructions:  *Please remember to flush/rinse tube with water or normal saline after feed. This will help prevent the tube from clogging.  *Don't keep cans of formula open for more than 24 hours.  *Stop feeding if you become nauseated or begin vomiting.  Hold the next feeding for 1 hour.  *Make sure you are sitting up during and after feeding.  *Give bolus or syringe feeding over 20 minutes.  Feedings given too fast can cause cramping and loose stools.    Current Oral Intake: NPO  Estimated Calorie Needs: 1700-1840 kcal (25-27 kcal/kg present wt 68kg)  Estimated Protein Needs: 82-88g (1.2-1.3g/kg present wt 68kg) Oral Diet Order: NPO  Oral Supplement: Boost Plus, TID  Intake (calories) Daily Average : 739 kilocalories (kcal (43% goal) )  Intake (protein) Daily Average : 34 grams (grams (41% goal))    Recommendation:  ??? Advance diet as able per SLP.                               ???  Intervention / Plan:  Monitor EN provision/tolerance.   Monitor weight, GI function, labs, medications,   Monitor diet advancement per SLP     Return Appointment   F/u with PCP in 1 week. F/u with pulm as needed.     Other Information   Pt has necrotizing hemorrhagic pneumonia with hemoptysis.   He also has small Pulm embolism. Not candidate for [facility].   Jonne Ply can be continued as tolerated. Otherwise, it can be held.   Prognosis is guarding. Family is aware.     Additional Discharge Instructions   augmentin may cause c diff.        Current Discharge Medication List       START taking these medications    Details   albuterol (PROAIR HFA, VENTOLIN HFA, OR PROVENTIL HFA) 90 mcg/actuation inhaler Inhale two puffs by mouth into the lungs every 4 hours as needed for Wheezing or Shortness of Breath. Shake well before use.    PRESCRIPTION TYPE:  No Print      amoxicillin/K clavulanate (AUGMENTIN) 400 mg/5 mL oral suspension 10 mL by PEG Tube route twice daily for 7 days. Take with food.  Qty: 140 mL, Refills: 0    PRESCRIPTION TYPE:  No Print      dextromethorphan/guaiFENesin (ROBITUSSIN-DM) 10/100 mg/5 mL syrp oral syrup 10 mL by PEG Tube route every 6 hours as needed.  Qty: 100 mL, Refills: 0    PRESCRIPTION TYPE:  No Print      doxazosin (CARDURA) 1 mg tablet one tablet by PEG Tube route daily.  Qty: 30 tablet, Refills: 0    PRESCRIPTION TYPE:  No Print      omeprazole (FIRST-OMEPRAZOLE) 2 mg/mL oral solution Take 10 mL by mouth daily.  Qty: 1 kit, Refills: 0    PRESCRIPTION TYPE:  No Print      polyethylene glycol 3350 (MIRALAX) 17 g packet one packet by PEG Tube route twice daily as needed.  Qty: 12 each, Refills: 0    PRESCRIPTION TYPE:  No Print senna/docusate (SENOKOT-S) 8.8/50 mg /10 mL solution 10 mL by PEG Tube route twice daily as needed.  Qty: 28 mL, Refills: 0    PRESCRIPTION TYPE:  Print          CONTINUE these medications which have been CHANGED or REFILLED    Details   aspirin EC 81 mg tablet Take one tablet by mouth daily. Take with food.  Please hold if Hb is dropping or if he has hemoptysis.  Qty: 30 tablet, Refills: 0    PRESCRIPTION TYPE:  No Print  Associated Diagnoses: Severe aortic stenosis; Coronary artery disease involving native coronary artery of native heart without angina pectoris      carvedilol (COREG) 12.5 mg tablet Take one tablet by mouth twice daily. Take with food.    PRESCRIPTION TYPE:  No Print  Associated Diagnoses: Chronic diastolic (congestive) heart failure (HCC)      hydrALAZINE (APRESOLINE) 25 mg tablet Take one tablet by mouth three times daily. Hold if SBP less 120  Qty: 30 tablet, Refills: 0    PRESCRIPTION TYPE:  No Print          CONTINUE these medications which have NOT CHANGED    Details   dutasteride (AVODART) 0.5 mg PO capsule Take 0.5 mg by mouth Daily.    PRESCRIPTION TYPE:  Historical Med      fish oil- omega 3-DHA/EPA 300/1,000 mg capsule Take 1 Cap by mouth twice daily.  PRESCRIPTION TYPE:  Historical Med      food supplemt, lactose-reduced (ENSURE COMPLETE) 0.05-1.5 gram-kcal/mL liqd Take 1 Can by mouth three times daily with meals.    PRESCRIPTION TYPE:  Historical Med      levothyroxine (SYNTHROID) 100 mcg tablet Take 1 tablet by mouth daily.    PRESCRIPTION TYPE:  Historical Med      montelukast (SINGULAIR) 10 mg tablet TAKE ONE TABLET BY MOUTH ONCE DAILY  Qty: 90 Tab, Refills: 2    PRESCRIPTION TYPE:  Normal          The following medications were removed from your list. This list includes medications discontinued this stay and those removed from your prior med list in our system        acetaminophen (TYLENOL) 325 mg tablet        cilostazol(+) (PLETAL) 50 mg tablet fenofibrate micronized (LOFIBRA) 134 mg capsule        simvastatin (ZOCOR) 80 mg tablet        spironolactone (ALDACTONE) 25 mg tablet        tadalafil (CIALIS) 20 mg tablet               Scheduled appointments:     F/u with PCP in 1 week.   F/u with pulm as needed.                  Pending items needing follow up: CBC and CMP weekly    Signed:  Rhyleigh Grassel Jake Shark, MD  12/13/2016      cc:  Primary Care Physician:  Steva Ready   Verified  Referring physicians:  Zhidovetskiy, Dmitriy, *   Additional provider(s):

## 2016-12-14 LAB — BASIC METABOLIC PANEL CELLULAR THERAPEUTICS
Lab: 131 MMOL/L — ABNORMAL LOW (ref 137–147)
Lab: 4.3 MMOL/L — ABNORMAL LOW (ref 3.5–5.1)

## 2016-12-14 LAB — CBC CELLULAR THERAPEUTICS: Lab: 10 K/UL — ABNORMAL LOW (ref 4.5–11.0)

## 2016-12-14 MED ORDER — SODIUM CHLORIDE 0.9 % FLUSH
3-5 mL | Freq: Three times a day (TID) | INTRAVENOUS | 0 refills | Status: DC
Start: 2016-12-14 — End: 2016-12-26

## 2016-12-14 NOTE — Progress Notes
Heart Failure Nursing Progress Note    Admission Date: 12/12/2016  LOS: 2 days    Admission Weight: 60.5 kg (133 lb 6.1 oz)        Most recent weights (inpatient):   Vitals:    12/12/16 1550 12/13/16 0600 12/14/16 0615   Weight: 60.5 kg (133 lb 6.1 oz) 61.9 kg (136 lb 6.4 oz) 61.4 kg (135 lb 6.4 oz)     Weight change from previous day:-0.5kg    Fluid restriction ordered: none    Intake/Output Summary: (Last 24 hours)    Intake/Output Summary (Last 24 hours) at 12/14/16 0732  Last data filed at 12/14/16 16100619   Gross per 24 hour   Intake             1919 ml   Output              500 ml   Net             1419 ml       Is patient incontinent: yes bowel    Anticipated discharge date: TBD  Discharge goals: To get stronger      Daily Assessment of Patient Stated Goals:    Short Term Goal Identified by patient (Short Term=during hospitalization):  TBD

## 2016-12-14 NOTE — Progress Notes
SPEECH-LANGUAGE PATHOLOGY  COGNITIVE ASSESSMENT // SWALLOW EVALUATION    EVALUATION SUMMARIES  Brief cognitive-communication and swallow evaluations completed.  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 Do also anticipate component of baseline dysphagia, as pt's son-in-law reports coughing/choking on foods intermittently before hospitalization. Pt exhibits cough reaction after 1/3 thin liquid trials. No overt s/s of aspiration with nectar thick liquids or pureed solids and oropharyngeal swallow appears coordinated; however, after PO trials, pt began to exhibits wet respirations indicating possible aspiration events. In light of history of silent aspiration on prior videoswallow study, anticipate need for repeat instrumental swallow evaluation before PO diet is re-initiated. Appears safe for ice chips with supervision; see recommendations below.     Pt exhibits at least moderate cognitive deficits, which pt's son-in-law confirms is at or near baseline cognitive function. Pt with cognitive deficits within areas of orientation, insight, attention, recall, and auditory comprehension. Pt currently lives alone in independent living facility, though family report that he does receive assistance from facility for cleaning, medication/finance management, and goes out for meals. Before hospitalization, pt was able to drive. Pt's hobbies include volunteering with a senior citizen group and playing Bingo.         RECOMMENDATIONS  Remain NPO w/ use of PEG for nutrition/hydration/medications.  Frequent oral care (2-3x/daily)  Ice chips (10-15/hour), as tolerated. Details at [facility]  Ongoing speech therapy to manage dysphagia  Anticipate need for repeat instrumental swallow evaluation early next week    Reorientation to be provided frequently by all staff  SLP will provide education to pt/family RE: memory strategies to maximize independence.  Consistent supervision recommended upon discharge as anticipate patient's impaired memory & attention will potentially impact their safety.  Recommend assist w/ finance & medication management and meal preparation upon discharge secondary to patient's impaired attention and/or memory.       COGNITIVE-COMMUNICATION EVALUATION  PRAGMATICS   Comments*: Limited turn-taking and topic maintenance abilities. Intermittently requires encouragement to participate.    BEHAVIOR  Comments*: Pt does not exhibit impulsivity or abnormal behavior. Lability noted across answers.    AUDITORY COMPREHENSION  Repetition of complex instructions required frequently throughout evaluation.      Simple, One-step commands; 100% accuracy  Two-step commands: 2/3   Complex commands: 2/3       ORIENTATION   Orientation moderately impaired.  Oriented to person, city/state, place, and month.  Was able to state correct date when cued to look at whiteboard.  Unable to state correct year until provided binary cues.  Does not demonstrate insight into deficits.    AUDITORY ATTENTION/WORKING MEMORY   Digit Repetition: Highest repeated forward=5; Highest repeated backward=3  Delayed Recall: Pt not able to recall any of 3 words indepednently after 3-min delay. Given mc/category cues, pt able to recall 3/3 words  Auditory Memory: Pt able to answer 2/3 listening comprehension questions after listening to 24-word story. 2/4 questions correctly after 48-word story    AUDITORY MEMORY/SUSTAINED ATTENTION   to be assessed    NEW LEARNING:   to be assessed    SEQUENCING/ORGANIZATION       PROBLEM SOLVING   Safety Scenarios: Pt able to provide correct/thorough response when asked what he would do within safety scenarios in 2/3 opportunities.           CLINICAL SWALLOW EVALUATION  ORAL MECH EXAMINATION: Pt exhibits generalized oral weakness. Vocal quality WFL before PO trials. Volitional cough weak and generally non-productive. Pt with  full dentures in place.    ORAL PHASE: Oral phase of swallow judged to be mildly impaired. Pt exhibits normal withdraw. Formation and transfer of bolus mildly disorganized and suspect presence of premature posterior bolus spillage, especially with thin liquids.    PHARYNGEAL PHASE: Possible moderate-severe impairment present in relation to weakness/mistimed airway protection. Pt exhibits timely swallow initiation, though premature posterior bolus spillage and current respiratory status may be causing mistimed/inadequate airway protection. Pt exhibits overt coughing episode with 1/3 thin liquid trials by small sip/tsp. No overt s/s of aspiration with nectar thick liquids (small sips, tsp) or 1/2 tsp amounts of pureed solids; however, wet respirations noticeable after PO trials. In light of hx of silent aspiration, would recommend repeat instrumental swallow evaluation prior to PO re-initiation.       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 Imaging with atelectasis and loculated left sided pleural effusion concerning for possible endobronchial lesion with post-obstructive  pneumonia. Pleural effusion could be malignant versus parapneumonic effusion from pneumonia or aspiration versus hemothorax given recent fall. Bedside ultrasound with left sided pleural effusion with fibrinous exudate and loculations. Chest tube subsequently placed given concern for complicated pleural space and potential need for tpa/dornase. Pleural effusion slightly turbid. Will maintain chest tube to water seal overnight and will determine duration of placement based on pleural studies.    CTA Chest 12/10/16 IMPRESSION  1. Small right lower lobe pulmonary embolism. This was discussed with the   patient's nurse, Morrie Sheldon by telephone at 1:49 PM on 12/10/2016.  2. Stable extensive left lower lobe consolidation with some higher density   areas suggesting hemorrhage, and scattered smaller patchy infiltrates in   both lungs, compatible with pneumonia.  3. Stable scattered mild nodular and groundglass pulmonary infiltrates   compatible with atypical pneumonia.  4. Persistent small left pleural effusion.  5. Stable mild mediastinal and hilar lymphadenopathy, likely reactive.  6. Persistent mild cardiomegaly with at least moderate coronary artery and   mitral annulus calcification, small pericardial effusion, and TAVR in   place. ???    Prior SLP Evaluations  Videoswallow study 12/04/16:  Results suggest moderate/severe oropharyngeal dysphagia characterized by trace silent aspiration thin and nectar thick liquids before to during the swallow secondary to mistimed airway protection and incomplete laryngeal vestibular closure. Aspiration events noted to be silent with no cough response appreciated. When penetration/aspiration events did occur and volitional cough was prompted, cough noted to be weak. Incidence of penetration/aspiration reduced but not eliminated with nectar thick liquids as opposed to thin liquids and with smaller bolus volumes (liquids by teaspoon). However, pt continued to present with inconsistent penetration/aspiration nectar as well as increased residue as viscosity increased (nectar, pudding barium). Other strategies attempted, but with minimal effect to detrimental effects, including chin tuck and effortful swallow due to residue in pyriforms and cognitive impairment. Overall, pt with only trace aspiration; however, anticipate these to be cumulative over course of meal and pt also with increased pharyngeal residue and difficulty clearing. Suspect dysphagia to be potential source, at least in part, of respiratory status changes. Anticipate generalized weakness/debility superimposed on baseline dysphagia consistent with COPD and dementia to be primary source of dysphagia    Videoswallow study  11/21/16:  mild oropharyngeal dysphagia of unknown etiology. Question h/o CVA or current respiratory status to contribute to pt's dysphagia. Aspiration noted w/ thin liquids via straw during the swallow due to delayed airway protection. When the straw was eliminated, no aspiration was observed.  However, the pt does appear to demo slowed airway protection during the swallow so anticipate that aspiration could occur intermittently during meals. Also anticipate that if the pt is fatigued his risk for aspiration would be greater considering his slowed airway protection during the swallow. Mild-moderate pharyngeal residue was observed following all consistencies, however, when the pt was cued to complete a hard swallow this did decreased the amount of residue.    Hearing: WFL  Lives With: Alone (independent living facility)  Receives Help From: Patient Care Assistant, Family  Vocational: Retired  Psychosocial Status: Willing and Cooperative to Participate  Persons Present:  (son in Social worker)    Subjective*  Pain: Patient demonstrates no signs of pain  Trach Presence: No  Feeding Tube Present During Eval:  (PEG)    Education*  Persons Educated: Pt/Family  Barriers To Learning: Cognitive Deficits  Interventions: Family Educated, Engineer, water, Provided Astronomer Methods: Verbal, Printed  Topics: Dysphagia, Memory  Patient Response: Verbalized Understanding  Goal Formulation: With Pt/Family      Speech Long Term Goals  Will improve skills necessary for ADI's and safety in the home with: Moderate assist  Patient will exhibit safe swallow for least restrictive consistency: Progressing    Speech Short Term Goals Will demonstrate memory for safety sequences for transfer and ADLs with: 80% accuracy, Moderate cues  Will perform dysphagia exercises with: 80% accuracy, Moderate cues  Will participate in repeat instrumental swallow evaluation when clinically indicated: 100% accuracy, Moderate cues  Patient will tolerate therapeutic trials of nectar liquids: 80% accuracy, Minimal cues    Therapist: Camillo Flaming , L/CCC-SLP (Pager [phone]; Voalte: [phone])  Date: 12/14/2016

## 2016-12-14 NOTE — Progress Notes
Physical Medicine & Rehabilitation Progress Note       Today's Date:  12/14/2016  Admission Date: 12/12/2016  LOS: 2 days  Insurance:     Principal Problem:    Debility  Active Problems:    Hypertension    Hyperlipidemia    CAD (coronary artery disease)    Weight loss    Aortic stenosis    Stage 3 chronic kidney disease (HCC)    PAD (peripheral artery disease) (HCC)    Chronic diastolic (congestive) heart failure (HCC)    S/P TAVR (transcatheter aortic valve replacement)    Localized edema    Parapneumonic effusion    Impaired mobility    Pulmonary embolism (HCC)    Necrotizing pneumonia (HCC)                         Assessment/Plan:       Current diagnoses and medications reviewed.  Will continue on current medications.    Hgb 9.5 (11/21), 10 (11/22), 10.3 (11/23)    RLL PE.  Holding DVT ppx due to hemoptysis.  Discussed with patient and son/daughter the difficulty of ppx given his current PE (without known source as doppler of BLE was negative).  He had been ambulating with family across the street with use of gait belt and FWW.  We discussed with patient and son that there would be a fall risk with ambulation, but he could continue that progress as he had across the street.  Only with assistance!     Subjective     Javier Gutierrez is a 76 y.o. male.  Patient was seen in with resident physician in his room with his son.  No acute events reported overnight.  Patient denies any current shortness of breath, chest pain, nausea, vomiting, insomnia.  Still coughing up some blood, son thinks that it is slowing down some and hasn't seen any today.      Objective                        Vital Signs: Last Filed                 Vital Signs: 24 Hour Range   BP: 135/65 (11/23 0808)  Temp: 36.3 ???C (97.4 ???F) (11/23 0435)  Pulse: 73 (11/23 0808)  Respirations: 18 PER MINUTE (11/23 0435)  SpO2: 96 % (11/23 0435)  O2 Delivery: None (Room Air) (11/23 0435) BP: (116-148)/(53-75)   Temp:  [36.3 ???C (97.4 ???F)-36.6 ???C (97.8 ???F)] Pulse:  [69-73]   Respirations:  [17 PER MINUTE-18 PER MINUTE]   SpO2:  [96 %-97 %]   O2 Delivery: None (Room Air)   Intensity Pain Scale (Self Report): 0 (12/14/16 0805) Vitals:    12/12/16 1550 12/13/16 0600 12/14/16 0615   Weight: 60.5 kg (133 lb 6.1 oz) 61.9 kg (136 lb 6.4 oz) 61.4 kg (135 lb 6.4 oz)       Intake/Output Summary:  (Last 24 hours)    Intake/Output Summary (Last 24 hours) at 12/14/16 1047  Last data filed at 12/14/16 1015   Gross per 24 hour   Intake             2404 ml   Output              500 ml   Net             1904 ml      Stool Occurrence:  2       Oral Diet Order: NPO  Last Bowel Movement Date: 12/14/16             Physical Exam  VS: BP 135/65  - Pulse 73  - Temp 36.3 ???C (97.4 ???F)  - Ht 177.8 cm (70)  - Wt 61.4 kg (135 lb 6.4 oz)  - SpO2 96%  - BMI 19.43 kg/m???   Gen: AOX3, NAD  HEENT: NCAT  Heart: RRR no MRG  Lungs: CTAB  Abd: S/NT/ND/+BS  Ext: No clubbing / No cyanosis / No edema / No calf pain  MS: Moves extremities spontaneously      Therapy Notes & Labs Reviewed.      ATTESTATION    I personally observed the resident performing the E/M, discussed case with resident, and concur with resident documentation of history, physical assessment and treatment plan unless otherwise noted.    Staff name:  Don Perking, MD Date:  12/14/2016

## 2016-12-15 LAB — CBC CELLULAR THERAPEUTICS: Lab: 9.3 K/UL — ABNORMAL LOW (ref 4.5–11.0)

## 2016-12-15 LAB — BASIC METABOLIC PANEL CELLULAR THERAPEUTICS: Lab: 128 MMOL/L — ABNORMAL LOW (ref >60)

## 2016-12-15 LAB — OSMOLALITY: Lab: 280 mosm/kg (ref 280–307)

## 2016-12-15 LAB — OSMOLALITY-URINE RANDOM: Lab: 616 mosm/kg (ref 50–1400)

## 2016-12-15 LAB — SODIUM-URINE RANDOM: Lab: 75 MMOL/L

## 2016-12-15 NOTE — Progress Notes
RT Adult Assessment Note    NAME:Javier Gutierrez             MRN: [id number]             DOB:Jan 01, 1941          AGE: 76 y.o.  ADMISSION DATE: 12/12/2016             DAYS ADMITTED: LOS: 3 days    RT Treatment Plan:  Protocol Plan: Medications  Albuterol: MDI PRN         Additional Comments:  Impressions of the patient: pt comfortable on RA      Vital Signs:  Pulse: Pulse: 70  RR:    SpO2: SpO2: 95 %  O2 Device:    Liter Flow:    O2%:    Breath Sounds: All Breath Sounds: Clear (implies normal)  Respiratory Effort:

## 2016-12-15 NOTE — Progress Notes
Physical Medicine & Rehabilitation Progress Note       Today's Date:  12/15/2016  Admission Date: 12/12/2016  LOS: 3 days  Insurance:     Principal Problem:    Debility  Active Problems:    Hypertension    Hyperlipidemia    CAD (coronary artery disease)    Weight loss    Aortic stenosis    Stage 3 chronic kidney disease (HCC)    PAD (peripheral artery disease) (HCC)    Chronic diastolic (congestive) heart failure (HCC)    S/P TAVR (transcatheter aortic valve replacement)    Localized edema    Parapneumonic effusion    Impaired mobility    Pulmonary embolism (HCC)    Necrotizing pneumonia (HCC)                         Assessment/Plan:       Current diagnoses and medications reviewed.  Will continue on current medications.    Hgb 9.5 (11/21), 10 (11/22), 10.3 (11/23), 9.9 (11/24)    Hyponatremia.  Will ask for assist in mgmt from IM.  Continues to drop values 137 --> 134 --> 131 --> 128    Subjective     Javier Gutierrez is a 76 y.o. male.  Patient was seen in with resident physician in his room with his son.  No acute events reported overnight.  Patient denies any current shortness of breath, chest pain, nausea, vomiting, insomnia.      Objective                        Vital Signs: Last Filed                 Vital Signs: 24 Hour Range   BP: 115/56 (11/24 0838)  Temp: 36.6 ???C (97.8 ???F) (11/24 0400)  Pulse: 67 (11/24 0838)  Respirations: 16 PER MINUTE (11/24 0400)  SpO2: 94 % (11/24 0400)  O2 Delivery: None (Room Air) (11/24 0400) BP: (112-143)/(54-63)   Temp:  [36.4 ???C (97.6 ???F)-36.7 ???C (98.1 ???F)]   Pulse:  [66-72]   Respirations:  [16 PER MINUTE-18 PER MINUTE]   SpO2:  [94 %-96 %]   O2 Delivery: None (Room Air)   Intensity Pain Scale (Self Report): 0 (12/14/16 1545) Vitals:    12/13/16 0600 12/14/16 0615 12/15/16 0700   Weight: 61.9 kg (136 lb 6.4 oz) 61.4 kg (135 lb 6.4 oz) 57.8 kg (127 lb 6.4 oz)       Intake/Output Summary:  (Last 24 hours)    Intake/Output Summary (Last 24 hours) at 12/15/16 0853 Last data filed at 12/15/16 0310   Gross per 24 hour   Intake             1765 ml   Output              200 ml   Net             1565 ml      Stool Occurrence: 2       Oral Diet Order: NPO  Last Bowel Movement Date: 12/14/16             Physical Exam  VS: BP 115/56  - Pulse 67  - Temp 36.6 ???C (97.8 ???F)  - Ht 177.8 cm (70)  - Wt 57.8 kg (127 lb 6.4 oz)  - SpO2 94%  - BMI 18.28 kg/m???   Gen: AOX3, NAD  HEENT: NCAT  Heart: RRR no MRG  Lungs: CTAB  Abd: S/NT/ND/+BS  Ext: No clubbing / No cyanosis / No edema / No calf pain  MS: Moves extremities spontaneously      Therapy Notes & Labs Reviewed.      ATTESTATION    I personally observed the resident performing the E/M, discussed case with resident, and concur with resident documentation of history, physical assessment and treatment plan unless otherwise noted.    Staff name:  Don Perking, MD Date:  12/15/2016

## 2016-12-15 NOTE — Progress Notes
Heart Failure Nursing Progress Note    Admission Date: 12/12/2016  LOS: 2 days    Admission Weight: 60.5 kg (133 lb 6.1 oz)        Most recent weights (inpatient):   Vitals:    12/12/16 1550 12/13/16 0600 12/14/16 0615   Weight: 60.5 kg (133 lb 6.1 oz) 61.9 kg (136 lb 6.4 oz) 61.4 kg (135 lb 6.4 oz)     Weight change from previous day:-0.5kg    Fluid restriction ordered: None    Intake/Output Summary: (Last 24 hours)    Intake/Output Summary (Last 24 hours) at 12/14/16 1907  Last data filed at 12/14/16 1820   Gross per 24 hour   Intake             2685 ml   Output              800 ml   Net             1885 ml       Is patient incontinent Yes    Anticipated discharge date: TBD  Discharge goals: To go home      Daily Assessment of Patient Stated Goals:    Short Term Goal Identified by patient (Short Term=during hospitalization):  To no longer be NPO

## 2016-12-15 NOTE — Progress Notes
RN heard patient coughing. Upon assessment pt was drinking melted ice chips. Removed cup away from reach and educated pt about swallowing precautions and aspiration risk. Will continue to educate and monitor.

## 2016-12-15 NOTE — Progress Notes
SPEECH-LANGUAGE PATHOLOGY  REHAB SPEECH DAILY NOTE       SUMMARY: Speech therapy 2x today for 30 minute sessions to address dysphagia and to provide pt's daughter & son-in-law with education regarding cognitive strategies in order to maximize independence at home.    PLAN: Continue current therapy plan.    FREQUENCY TODAY: 2x     RECOMMENDATIONS  Remain NPO w/ use of PEG for nutrition/hydration/medications.  Frequent oral care (2-3x/daily)  Ice chips (10-15/hour), as tolerated. Details at [facility]  Ongoing speech therapy to manage dysphagia  Anticipate need for repeat instrumental swallow evaluation early next week  ???  Reorientation to be provided frequently by all staff. Memory book to be written in by all staff.  SLP will provide education to pt/family RE: memory strategies to maximize independence.  Consistent supervision recommended upon discharge as anticipate patient's impaired memory & attention will potentially impact their safety.  Recommend assist w/ finance & medication management and meal preparation upon discharge secondary to patient's impaired attention and/or memory.       ACTIVITIES FOR THE DAY:  Session One  Utilized first session to educate pt's daughter and son-in-law about memory & various cognitive strategies to utilize in order to maximize independence. Pt currently lives alone but is assisted with his finances, medications, and does not have to cook. Per pt's daughter and son-in-law, pt's cognition is consistent with his baseline function. Educational handouts were provided and explained, which outline strategies to maximize orientation and attention, as well as memory.    Provided patient with November/December calendars and a memory book. As pt is not currently oriented to date or year, reviewed calendar extensively and then wrote current date in memory book. Pt having difficulty recalling earlier daily events, so his daughter assisted in recalling earlier events to write in memory book. Pt appears open to using memory book as an aid. Recommend memory book be utilized by all staff members to assist in orientation/recall throughout each day & rehabilitation stay.      Session Two  Second session was used to addressed dysphagia goals.   Swallowing exercises were printed and re-introduced to patient. Pt able to relearn each exercise and completed with 80% accuracy/mod cues. The following were completed: Lingual protrusion x10, Masako Maneuver (attempted, but proved to be too difficult for pt to comprehend), Effortful swallow x10 (with ice chips), and Pitch Glides x10.    Pt encouraged to complete exercises 5-7x/day with the assistance of his family, who demonstrate understanding of each exercise. Also encouraged to continue thorough oral care, 2-3x/daily.    Completed PO trials (thin liquids via tsp/sip, nectar thick via sip, and pureed solids).  Pt exhibits overt delayed cough reaction in 1/2 thin liquid trials via small sip. No overt s/s of aspiration with several drinks of nectar thick liquids by cup (~1.5 ounces total). With pureed solids, pt exhibits no overt s/s of aspiration and does not exhibit globus sensation, yet question presence of pharyngeal residue d/t slightly worsened wet vocal quality after PO trials ended. Continue to recommend repeat instrumental swallow evaluation early next week to reassess swallow function /determine least restrictive diet.    Speech Long Term Goals  Will improve skills necessary for ADI's and safety in the home with: Moderate assist  Patient will exhibit safe swallow for least restrictive consistency: Progressing    Speech Short Term Goals  Will demonstrate memory for safety sequences for transfer and ADLs with: 80% accuracy, Moderate cues  Will perform dysphagia exercises with: 80%  accuracy, Moderate cues  Will participate in repeat instrumental swallow evaluation when clinically indicated: 100% accuracy, Moderate cues Patient will tolerate therapeutic trials of nectar liquids: 80% accuracy, Minimal cues    Therapist: Camillo Flaming , L/CCC-SLP (Pager [phone]; Voalte: [phone])  Date: 12/15/2016

## 2016-12-15 NOTE — Progress Notes
Progress Note      Admission Date: 12/12/2016                                                LOS: 2 days        Assessment/Plan    Javier Gutierrez is a 76 y.o. male with history of hypertension, hyperlipidemia, coronary artery disease, aortic stenosis, chronic kidney disease, peripheral artery disease, and lifetime smoker who was admitted to hospital 10/30 for weakness and cough.  Found to have complicated empyema S/P Chest tube IV antibiotics was transferred to rehab 11/21  , medicine were consulted for continuity of care     Complicated parapneumonic effusion pleural fluid culture STREPTOCOCCUS INTERMEDIUS sensitive to PCN Pulmonary treated the empyema with TPA/dornase.Infection disease was consulted and the patient was treated with full course of vancomycin, Levaquin.  Zosyn was continued and was switched later on to Cefpodoxime and on November 20 2 Augmentin for 1 more week after discharge.   Chronic kidney disease creatinine 0.73   hemoptysis S/P 2 units of transfusion on November 17.  CT chest showed necrotizing hemorrhagic pneumonia in the left side and small pulmonary embolism  Recent small PE Discussion with the family by pulmonary led to a decision to avoid anticoagulation due to risk of bleeding   Hypertension uncontrolled started on Cardura 1 mg daily   Coronary artery disease PTA Aspirin/COreg  Dysphagia on TF   Hypothyroidism  PTA synthroid 100  Anemia Hg 10 stable   Hx of aortic stenosis S/P AVR Sapien S3 bioprosthetic valve 2017   Lytic lesions cervical spine  on CT spine from 10/30 , indeterminate  needs follow up as outpatient     Plan :  - Continue coreg 12.5 BID , home dose was 25 BID .   - Consider moving doxazosin dose to night to avoid orthostatic hypotension and help nocturia   - needs follow up CT chest as outpatient as underlying malignancy can not be ruled out , CT cervical spine 10/30  showed possible lytic lesions-not confirmed-  . - Continue ASA as the patient has bioprosthetic valve replacement , monitor hemoptysis .  - Continue Augmentin until 11/27        please call [phone] with questions       ______________________________________________________________________    S: the patient denied hemoptysis or shortness of breath, afebrile , he said he had three bowel movements last 24 h , appetite is low .        Scheduled Meds:    amoxicillin/K clavulanate (AUGMENTIN) oral suspension 800 mg 800 mg PEG Tube BID   carvedilol (COREG) tablet 12.5 mg 12.5 mg PEG Tube BID   docusate (COLACE) oral solution 100 mg 100 mg PEG Tube BID   doxazosin (CARDURA) tablet 1 mg 1 mg PEG Tube QDAY   levothyroxine (SYNTHROID) tablet 100 mcg 100 mcg PEG Tube QDAY   montelukast (SINGULAIR) tablet 10 mg 10 mg PEG Tube QDAY   pantoprazole(#) (PROTONIX) suspension 40 mg 40 mg Feeding Tube BID(11-21)   senna (SENOKOT) tablet 2 tablet 2 tablet PEG Tube QHS   sodium chloride PF 0.9% flush 3-5 mL 3-5 mL Intravenous FLUSH TID   Continuous Infusions:  PRN and Respiratory Meds:acetaminophen Q4H PRN, albuterol Q4H PRN, aluminum/magnesium hydroxide Q4H PRN, bisacodyl QDAY PRN, dextromethorphan/guaiFENesin Q4H PRN, milk of magnesia (CONC) Q4H PRN, ondansetron  Q6H PRN, pancrelipase 20,000 Units/ sodium bicarbonate 650 mg(#) PRN (On Call from Rx), polyethylene glycol 3350 BID PRN        Vital Signs:  Last Filed in 24 hours Vital Signs:  24 hour Range    BP: 122/57 (11/23 2050)  Temp: 36.4 ???C (97.6 ???F) (11/23 2050)  Pulse: 71 (11/23 2050)  Respirations: 18 PER MINUTE (11/23 2050)  SpO2: 94 % (11/23 2050)  O2 Delivery: None (Room Air) (11/23 2050) BP: (116-143)/(53-75)   Temp:  [36.3 ???C (97.4 ???F)-36.6 ???C (97.9 ???F)]   Pulse:  [69-73]   Respirations:  [17 PER MINUTE-18 PER MINUTE]   SpO2:  [94 %-97 %]   O2 Delivery: None (Room Air)     Physical Exam:  Gen: Alert and oriented, cooperative and no distress.   Neck: supple, symmetrical, trachea midline, no adenopathy, no JVD, no bruits, Back: symmetric, no curvature. ROM normal. No CVA tenderness.   Lungs: clear to auscultation bilaterally, no wheezes, rales, rhonchi   Heart: regular rate and rhythm, S1, S2 normal, no murmur, click, rub or gallop   Abdomen:  PEG tube in place soft, non-tender. Bowel sounds normal. No masses, no organomegaly, non-distended   Extremities: pulses palpable, no pedal edema or skin lesions.    Lab/Radiology/Other Diagnostic Tests:  24-hour labs:    Results for orders placed or performed during the hospital encounter of 12/12/16 (from the past 24 hour(s))   CBC CELLULAR THERAPEUTICS    Collection Time: 12/14/16  7:37 AM   Result Value Ref Range    White Blood Cells 10.6 4.5 - 11.0 K/UL    RBC 3.31 (L) 4.4 - 5.5 M/UL    Hemoglobin 10.3 (L) 13.5 - 16.5 GM/DL    Hematocrit 16.1 (L) 40 - 50 %    MCV 93.2 80 - 100 FL    MCH 31.1 26 - 34 PG    MCHC 33.4 32.0 - 36.0 G/DL    RDW 09.6 (H) 11 - 15 %    Platelet Count 309 150 - 400 K/UL    MPV 9.0 7 - 11 FL   BASIC METABOLIC PANEL CELLULAR THERAPEUTICS    Collection Time: 12/14/16  7:37 AM   Result Value Ref Range    Sodium 131 (L) 137 - 147 MMOL/L    Potassium 4.3 3.5 - 5.1 MMOL/L    Chloride 100 98 - 110 MMOL/L    CO2 26 21 - 30 MMOL/L    Anion Gap 5 3 - 12    Glucose 107 (H) 70 - 100 MG/DL    Blood Urea Nitrogen 20 7 - 25 MG/DL    Creatinine 0.45 0.4 - 1.24 MG/DL    Calcium 8.6 8.5 - 40.9 MG/DL    eGFR Non African American >60 >60 mL/min    eGFR African American >60 >60 mL/min     Pertinent radiology reviewed.    Mammie Russian, MD  Pager [phone]

## 2016-12-15 NOTE — Progress Notes
Heart Failure Nursing Progress Note    Admission Date: 12/12/2016  LOS: 3 days    Admission Weight: 60.5 kg (133 lb 6.1 oz)        Most recent weights (inpatient):   Vitals:    12/13/16 0600 12/14/16 0615 12/15/16 0700   Weight: 61.9 kg (136 lb 6.4 oz) 61.4 kg (135 lb 6.4 oz) 57.8 kg (127 lb 6.4 oz)     Weight change from previous day:-8kg    Fluid restriction ordered: Yes. Only 10-15 ice chips per hour  Intake/Output Summary: (Last 24 hours)    Intake/Output Summary (Last 24 hours) at 12/15/16 0738  Last data filed at 12/14/16 2140   Gross per 24 hour   Intake             1765 ml   Output              300 ml   Net             1465 ml       Is patient incontinent Yes    Anticipated discharge date: TBD  Discharge goals: Not known as this time.      Daily Assessment of Patient Stated Goals:    Short Term Goal Identified by patient (Short Term=during hospitalization):  Not known at this time

## 2016-12-15 NOTE — Progress Notes
Heart Failure Nursing Progress Note    Admission Date: 12/12/2016  LOS: 3 days    Admission Weight: 60.5 kg (133 lb 6.1 oz)        Most recent weights (inpatient):   Vitals:    12/13/16 0600 12/14/16 0615 12/15/16 0700   Weight: 61.9 kg (136 lb 6.4 oz) 61.4 kg (135 lb 6.4 oz) 57.8 kg (127 lb 6.4 oz)     Weight change from previous day: - 3.6 kg     Fluid restriction ordered: N/A - pt on ice chip protocol     Intake/Output Summary: (Last 24 hours)    Intake/Output Summary (Last 24 hours) at 12/15/16 1721  Last data filed at 12/15/16 1606   Gross per 24 hour   Intake             2115 ml   Output              235 ml   Net             1880 ml       Is patient incontinent and continent     Anticipated discharge date: TBD  Discharge goals: TBD      Daily Assessment of Patient Stated Goals:    Short Term Goal Identified by patient (Short Term=during hospitalization):  work on swallow exercises, timed voids

## 2016-12-15 NOTE — Rehab Care Plan
Physical Medicine & Rehabilitation Individualized Overall Plan of Care     Date of Service:  12/15/2016   Javier Gutierrez is a 76 y.o. male.     DOB: [birth year]                  MRN#:  [id number]  Insurance:  Medicare  Date of Admission:  12/12/2016    Hospital Course:     Mr Pehl is a 76 year old male with prior medical history of diastolic CHF, AS s/p TAVR, CAD, CKD, HTN, who presented to the emergency department at the [facility] on 11/20/2016 with weakness and shortness of air. Chest CT demonstrated left sided lung consolidation concerning for pneumonia with effusion and a suspicious lumg mass. Pulmonolgy was consulted, placed pigtail chest tube to suction, fluid analysis consistent with complicated pneumonic effusion. Empiric antiobiotic therapies initiated. The patient's stay has been complicated by acute respiratory failure with new oxygen requirement, sepsis, shock. Repeat chest CT on 11/24/2016 with small left hydropneumothorax, persistent left consolidation, new right lung infection followed by interval chest CT on 11/26/2016 with slight increased patchy ground glass opacities in the right lung. IV zosyn persists for CAP with complicated left parapneumonic pleural effusion, ID following. PEG tube was placed 12/06/2016 due to dysphagia and has been tolerating tube feeds. On 12/08/2016 the patient was noted to have hemoptysis, was anemic and required 2 units of PRBC.  CT and CTA chest were obtained on 12/10/2016 of which PE was noted in superior RLL and Pulmonology was consulted for further management as of 12/10/2016.  Patient with hemoptysis of small volume still as of 12/11/2016; however vital signs remains stable with patient on room air and hemoglobin remaining low but stable.   ???  Patient has been working with physical and occupational therapy since 11/22/2016 and has been making functional gains . Patient is anticipated to return to home at the supervision to modified independent level of care. Patient has also been working with speech language pathology on dysphagia; however will also receive formal cognitive evaluation to assess for baseline status and any new cognitive/linguistic deficits.   ???     Rehab Diagnosis:     debility due to complicated para pneumonic effusion leading to acute respiratory failure.   Balance impairment, Cognitive impairment and Weakness      Medical Course since IRF Admission:    The patient???s clinical course since admission has been stable.  The patient has been able to participate fully in the rehabilitation program due to pain management, respiratory symptom management, patient motivation and we have addressed in the following ways: continued medical oversight of comorbid conditions, respiratory therapy.    Medical Prognosis: Fair    The patient has a reasonable likelihood of fully participating in and completing the IRF stay based on ability to manage medical issues including  diastolic CHF, AS s/p TAVR, CAD, CKD, HTN.  There is a reasonable expectation of productive life in a community setting in which they will benefit from this stay.     This patient meets medical necessity requiring the intensity of therapy available at the [facility]. The patient can participate in and can fully benefit from the services offered in the IRF setting including 24 hour rehabilitation nursing, daily oversight from the Physiatrist, and complex interdisciplinary rehab as noted below.    Functional Independence Measures (FIMS) Current Level of Function  Evaluation FIMS Current FIMS     Eating FIM: 1 -  Total assistance, tube feeding for nutrition and/or hydration  Grooming FIM: 4 - Minimal contact assistance, patient performs 75% or more of grooming/bathing/dressing/toileting tasks  Bathing FIM: 1 - Total assistance, patient performs less than 25% of grooming/bathing/dressing/toileting tasks  Dressing - Upper Body FIM: 4 - Minimal contact assistance, patient performs 75% or more of grooming/bathing/dressing/toileting tasks  Dressing - Lower Body FIM: 3 - Moderate assistance, patient performs 50-74% or more of grooming/bathing/dressing/toileting tasks  Toileting FIM: 5 - Set up  Bladder FIM: 5 - Emptying device by staff  Bowel FIM: 6 - No bowel movement, medication  Transfers FIM: 4 - Minimal assistance, patient performs 75% or more of transferring tasks  Toilet Transfers FIM: 3 - Moderate assistance, patient performs 50-74% of transferring tasks (lifting required)     Shower Transfers FIM: 3 - Moderate assistance, patient performs 50-74% of transferring tasks (lifting required)  Gait: 4 - Minimal contact assistance, patient expends 75% or more effort, greater than or equal to 150 feet     Stairs FIM: 4 - Minimal contact guard assistance, greater than or equal to 12 stairs  Comprehension FIM: 6 - Modified independence - Extra time  Expression FIM: 5 - Standy prompting - Able to express abstract and/or basic information >90% of the time, prompting <10%  Social Interaction FIM: 4 - Minimal direction - Able to interact appropriately 75-90%  Problem Solving FIM: 3 - Moderate prompting - Able to solve routine problems 50-74%. Needs direction less than half of the time to initiate, plan or complete daily activities  Memory FIM: 3 - Moderate promting - Able to recognize people frequently encountered, remembers daily routines, responds to requests of others 50-74% of the time, needs prompting less than half of the time   Eating FIM: 1 - Total assistance, tube feeding for nutrition and/or hydration  Grooming FIM: 4 - Minimal contact assistance, patient performs 75% or more of grooming/bathing/dressing/toileting tasks  Bathing FIM: 1 - Total assistance, patient performs less than 25% of grooming/bathing/dressing/toileting tasks Dressing - Upper Body FIM: 4 - Minimal contact assistance, patient performs 75% or more of grooming/bathing/dressing/toileting tasks  Dressing - Lower Body FIM: 3 - Moderate assistance, patient performs 50-74% or more of grooming/bathing/dressing/toileting tasks  Toileting FIM: 1 - Total assistance, patient performs less than 25% of grooming/bathing/dressing/toileting tasks  Bladder FIM: 5 - Emptying device by staff  Bowel FIM: 6 - No bowel movement, medication  Transfers FIM: 4 - Contact guard assistance  Toilet Transfers FIM: 3 - Moderate assistance, patient performs 50-74% of transferring tasks (lifting required)     Shower Transfers FIM: 3 - Moderate assistance, patient performs 50-74% of transferring tasks (lifting required)  Gait: 4 - Minimal contact assistance, patient expends 75% or more effort, greater than or equal to 150 feet     Stairs FIM: 4 - Minimal contact guard assistance, greater than or equal to 12 stairs  Comprehension FIM: 3 - Moderate prompting. Understands directions/conversation about basic daily needs 50-74%  Expression FIM: 3 - Moderate prompting - Expresses directions/conversation about basic daily needs 50-74%  Social Interaction FIM: 3 - Moderate direction - Able to interact 50-74%  Problem Solving FIM: 2 - Maximal prompting - Able to solve routine problems 25-49%. Needs direction more than half of the time to initiate, plan, or complete simple daily activities. May need restraint for safety  Memory FIM: 2 - Maximal promting - Able to recognize people frequently encountered, remembers daily routines, responds to requests of others 25-49% of the time, needs  prompting less than half of the time     Physical Therapy Goals  Patient will perform: community mobility, at ambulation level, Independent    Occupational Therapy Goals  Pt will perform basic care and transfer with: Modified independence (least restrictive device)    Speech Therapy Goals Will improve skills necessary for ADI's and safety in the home with: Moderate assist  Patient will exhibit safe swallow for least restrictive consistency: Progressing    Nursing Goals  Bladder Management: Yes  Will decrease the number of bladder accidents during the day and/or night to: Progressing, No accidents  Patient will follow time voiding program independently with: Progressing, Minimum verbal cues  Bowel Management: Yes  Will verbalize need to have a bowel movement daily/every other day (for constipation) with: Progressing, Minimum verbal cues  Patient / Caregiver will verbalize knowledge of activity in relation to constipation with: Progressing, Minimum verbal cues  Medication Management: Yes  Will verbalize reason for medication with: Progressing, Moderate verbal / written cues  Will verbalize dose and time for medication with: Progressing, Moderate verbal / written cues  Will verbalize side effects of medication with: Progressing, Moderate verbal / written cues  Skin Integrity: Yes  Will verbalize optimal skin care routine with: Progressing, Minimum verbal cues  Will verbalize 2-3 risk factors for pressure ulcers with: Progressing, Minimum verbal / written cues  Will be able to call for pressure reliefs every 30 minutes when up in chair with: Progressing, Minimum verbal / written cues  Pain Management: No  Safety: Yes  Will demonstrate understanding of mobility precautions with: Progressing, Minimum verbal cues  Will demonstrate understanding of own limitations with: Progressing, Minimum verbal cues  Will verbalize the importance of using call light with: Progressing, Minimum verbal cues    Additional therapeutic disciplines may be included during this stay if indicated during interdisciplinary communication and will be noted in the daily progress notes when relevant.  Social Work will address discharge planning needs.    Rehabilitation Plan Patient will receive Physical therapy, Occupational therapy and Speech therapy each 60 minutes a day, 5 days a week for a total of 3 hours daily (minimum) for the duration of the rehabilitation stay within an interdisciplinary rehabilitation program with case manager/social worker, dietician, neuropsychologist, rehab nursing and PM&R oversight.    Rehabilitation Prognosis: Good, anticipate steady functional gains with PT, OT, and SLP to return home with family support.  Tolerance for three hours of therapy a day: Good, patient is tolerating 3 hours of therapy per day, as required.    Goals/Barriers/Facilitators  Family / Patient Goals: Patient would like to regain strength and endurance to safely return home.   Mobility Goals: Updated per therapy evaluation as above  Activities of Daily Living (ADLs) Goals: Updated per therapy evaluation as above.  Cognition / Communication Goals: Updated per therapy evaluation as above.       Barriers & Interventions:   Caregiver Apprehension: Arrange caregiver support and discuss barriers and patient progress with caregivers when appropriate.  Equipment Availability: Adaptive equipment, determine resources availability, equipment modifications, bariatric equipment.  High Burden of Care: Initiate interdisciplinary rehabilitation to improve functional independence and reduce burden of care.  Medication Education:  Pharmacist and nursing staff to provide education to patient and family regarding medication side effects, special precautions, and safe administration.  ???  Facilitators: good family / social support, patient motivation and improving strength / endurance  ???  Discharge Planning  Expected Length of Stay 7-10 day(s)  Expected Discharge Disposition  Home  Expected Discharge Needs: Patient wound benefit from ongoing nursing, speech language pathology, physical and occupational therapy at the Home Health level of care. Patient already owned a Nurse, adult and had [employer] in [employer]. Anticipate patient would benefit from a Paediatric nurse and a Games developer.   ???      The patient should reach their current goals by noted projected discharge date.    This plan of care was formulated based upon a review of the progress this patient made with therapies during the acute hospital stay, the goals set by the inpatient rehabilitation therapists at the time of their initial assessment, and my expertise in caring for patients with this rehabilitation diagnosis and accompanying comorbidities.    Don Perking, MD  12/15/16 7:59 AM

## 2016-12-16 LAB — BASIC METABOLIC PANEL CELLULAR THERAPEUTICS
Lab: 131 MMOL/L — ABNORMAL LOW (ref >60)
Lab: 4.4 MMOL/L — ABNORMAL LOW (ref 3.5–5.1)

## 2016-12-16 LAB — CBC CELLULAR THERAPEUTICS
Lab: 3.3 M/UL — ABNORMAL LOW (ref >60)
Lab: 9.3 K/UL — ABNORMAL HIGH (ref >60)

## 2016-12-16 MED ORDER — ASPIRIN 81 MG PO CHEW
81 mg | Freq: Every day | GASTROSTOMY | 0 refills | Status: DC
Start: 2016-12-16 — End: 2016-12-26
  Administered 2016-12-16 – 2016-12-26 (×11): 81 mg via GASTROSTOMY

## 2016-12-16 NOTE — Progress Notes
Heart Failure Nursing Progress Note    Admission Date: 12/12/2016  LOS: 4 days    Admission Weight: 60.5 kg (133 lb 6.1 oz)        Most recent weights (inpatient):   Vitals:    12/14/16 0615 12/15/16 0700 12/16/16 0600   Weight: 61.4 kg (135 lb 6.4 oz) 57.8 kg (127 lb 6.4 oz) 59.2 kg (130 lb 9.6 oz)     Weight change from previous day: Gain of 1.4kg.     Fluid restriction ordered: n/a- peg tube only 100cc Q6 hours ordered     Intake/Output Summary: (Last 24 hours)    Intake/Output Summary (Last 24 hours) at 12/16/16 0610  Last data filed at 12/16/16 [id number]   Gross per 24 hour   Intake             1775 ml   Output              535 ml   Net             1240 ml       Is patient incontinent: mostly continent, some incontinence     Anticipated discharge date: TBD  Discharge goals: TBD      Daily Assessment of Patient Stated Goals:    Short Term Goal Identified by patient (Short Term=during hospitalization):  Work on swallow exercises, time voids

## 2016-12-16 NOTE — Progress Notes
Physical Medicine & Rehabilitation Progress Note       Today's Date:  12/16/2016  Admission Date: 12/12/2016  LOS: 4 days  Insurance:     Principal Problem:    Debility  Active Problems:    Hypertension    Hyperlipidemia    CAD (coronary artery disease)    Weight loss    Aortic stenosis    Stage 3 chronic kidney disease (HCC)    PAD (peripheral artery disease) (HCC)    Chronic diastolic (congestive) heart failure (HCC)    S/P TAVR (transcatheter aortic valve replacement)    Localized edema    Parapneumonic effusion    Impaired mobility    Pulmonary embolism (HCC)    Necrotizing pneumonia (HCC)                         Assessment/Plan:       Current diagnoses and medications reviewed.  Will continue on current medications.    Hgb 9.5 (11/21), 10 (11/22), 10.3 (11/23), 9.9 (11/24)    Hyponatremia.  Continues to drop values 137 --> 134 --> 131 --> 128.  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  We decreased free water from 175 to 100 for meal bolus (putting total <1L), and will continue to monitor Na lab values following that adjustment.  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    Subjective     Javier Gutierrez is a 76 y.o. male.  Patient was seen in with resident physician in his room after being seen by IM.  No acute events reported overnight.  Patient denies any current shortness of breath, chest pain, nausea, vomiting, insomnia.      Objective                        Vital Signs: Last Filed                 Vital Signs: 24 Hour Range   BP: 128/67 (11/25 0254)  Temp: 36.3 ???C (97.4 ???F) (11/25 0254)  Pulse: 65 (11/25 0254)  Respirations: 17 PER MINUTE (11/25 0254)  SpO2: 95 % (11/25 0254)  O2 Delivery: None (Room Air) (11/25 0254) BP: (115-128)/(56-67) Temp:  [36.3 ???C (97.4 ???F)-36.6 ???C (97.8 ???F)]   Pulse:  [65-73]   Respirations:  [17 PER MINUTE-18 PER MINUTE]   SpO2:  [95 %-96 %]   O2 Delivery: None (Room Air)   Intensity Pain Scale (Self Report): 0 (12/15/16 0830) Vitals:    12/14/16 0615 12/15/16 0700 12/16/16 0600   Weight: 61.4 kg (135 lb 6.4 oz) 57.8 kg (127 lb 6.4 oz) 59.2 kg (130 lb 9.6 oz)       Intake/Output Summary:  (Last 24 hours)    Intake/Output Summary (Last 24 hours) at 12/16/16 0710  Last data filed at 12/16/16 0526   Gross per 24 hour   Intake             1775 ml   Output              535 ml   Net             1240 ml      Stool Occurrence: 1  Oral Diet Order: NPO  Last Bowel Movement Date: 12/16/16             Physical Exam  VS: BP 128/67 (BP Source: Arm, Left Upper)  - Pulse 65  - Temp 36.3 ???C (97.4 ???F)  - Ht 177.8 cm (70)  - Wt 59.2 kg (130 lb 9.6 oz)  - SpO2 95%  - BMI 18.74 kg/m???   Gen: AOX3, NAD  HEENT: NCAT  Heart: RRR no MRG  Lungs: CTAB  Abd: S/NT/ND/+BS  Ext: No clubbing / No cyanosis / No edema / No calf pain  MS: Moves extremities spontaneously      Therapy Notes & Labs Reviewed.      ATTESTATION    I personally observed the resident performing the E/M, discussed case with resident, and concur with resident documentation of history, physical assessment and treatment plan unless otherwise noted.    Staff name:  Don Perking, MD Date:  12/16/2016

## 2016-12-16 NOTE — Progress Notes
SPEECH-LANGUAGE PATHOLOGY  REHAB SPEECH DAILY NOTE       SUMMARY: Speech therapy 2x today for 30 minute sessions to address dysphagia and cognition.  ???  PLAN: Continue current therapy plan.  ???  FREQUENCY TODAY: 2x   ???  RECOMMENDATIONS  Remain NPO w/ use of PEG for nutrition/hydration/medications.  Frequent oral care (2-3x/daily)  Ice chips (10-15/hour), as tolerated. Details at [facility]  Ongoing speech therapy to manage dysphagia  Anticipate need for repeat instrumental swallow evaluation early next week  ???  Reorientation to be provided frequently by all staff. Memory book to be written in by all staff.  SLP will provide education to pt/family RE: memory strategies to maximize independence.  Consistent supervision recommended upon discharge as anticipate patient's impaired memory & attention will potentially impact their safety.  Recommend assist w/ finance &???medication management and meal preparation upon discharge secondary to patient's impaired attention and/or memory.   ???  ???  ACTIVITIES FOR THE DAY:  Session One  Reviewed November/December calendars and a memory book. Filled out memory book with assistance from patient. Very poor recall of earlier daily events in PT/OT.    Recommend memory book be utilized by all staff members to assist in orientation/recall throughout each day & rehabilitation stay.    O Log: 15/30. Ongoing education regarding orientation and tools pt can utilize around the room,such as  His clock and white board to assist in orientation.     Completed the following swallowing exercises after re-education was provided on each (as pt with poor recall of how to complete each exercise):   Lingual protrusion x10, did not attempt Masako maneuver, Pitch glides x10, effortful swallow x5 (with ice chips; cough reaction in 1/5 ice chips )  ???  ???  Session Two  The following exercises were completed: Lingual protrusion x10, Masako Maneuver (attempted, but proved to be too difficult for pt to comprehend), Effortful swallow x10 (with ice chips), and Pitch Glides x10.  ???  Pt encouraged to complete exercises 5-7x/day with the assistance of his family, who demonstrate understanding of each exercise. Also encouraged to continue thorough oral care, 2-3x/daily.  ???  Completed PO trials (thin liquids via tsp/sip, nectar thick via sip, and pureed solids). Swallow function appears worsened today when compared to yesterday, likely in relation to pt's lethargic mental status. Pt exhibits overt delayed cough reaction in 1/2 thin liquid trials via small sip. Pt exhibits cough reaction in 1/4 sips of nectar thick liquids, in addition to multiple swallows (2-3) per bolus. Pt exhibits overt cough reaction after 1/2 tsp of pureed solids.    ???  Speech Long Term Goals  Will improve skills necessary for ADI's and safety in the home with: Moderate assist  Patient will exhibit safe swallow for least restrictive consistency: Progressing  ???  Speech Short Term Goals  Will demonstrate memory for safety sequences for transfer and ADLs with: 80% accuracy, Moderate cues  Will perform dysphagia exercises with: 80% accuracy, Moderate cues  Will participate in repeat instrumental swallow evaluation when clinically indicated: 100% accuracy, Moderate cues  Patient will tolerate therapeutic trials of nectar liquids: 80% accuracy, Minimal cues      Therapist: Camillo Flaming , L/CCC-SLP (Pager [phone]; Voalte: [phone])  Date: 12/16/2016

## 2016-12-16 NOTE — Progress Notes
Progress Note      Admission Date: 12/12/2016                                                LOS: 4 days        Assessment/Plan    Javier Gutierrez is a 76 y.o. male with history of hypertension, hyperlipidemia, coronary artery disease, aortic stenosis, chronic kidney disease, peripheral artery disease, and lifetime smoker who was admitted to hospital 10/30 for weakness and cough.  Found to have complicated empyema S/P Chest tube IV antibiotics was transferred to rehab 11/21  , medicine were consulted for continuity of care     Hyponatremia SIADH :Na improved 128 to 131 , urine osmolality 616 urine sodium 78  Plasma osmolality 280 -near normal   Complicated parapneumonic effusion pleural fluid culture STREPTOCOCCUS INTERMEDIUS sensitive to PCN Pulmonary treated the empyema with TPA/dornase.Infection disease was consulted and the patient was treated with full course of vancomycin, Levaquin.  Zosyn was continued and was switched later on to Cefpodoxime and on November 20 2 Augmentin for 1 more week after discharge.   Chronic kidney disease creatinine 1.01   hemoptysis S/P 2 units of transfusion on November 17.  CT chest showed necrotizing hemorrhagic pneumonia in the left side and small pulmonary embolism  Recent small PE Discussion with the family by pulmonary led to a decision to avoid anticoagulation due to risk of bleeding   Hypertension  controlled  on Cardura 1 mg daily , coreg   Coronary artery disease PTA Aspirin/COreg  Dysphagia on TF   Hypothyroidism  PTA synthroid 100  Anemia Hg 10.3 stable   Hx of aortic stenosis S/P AVR Sapien S3 bioprosthetic valve 2017   Lytic lesions cervical spine  on CT spine from 10/30 , indeterminate  needs follow up as outpatient     Plan :  - Continue water restriction 100 ml free water Q6 per PEG , may decrease further or stop if Na worsens   --Continue coreg 12.5 BID , home dose was 25 BID .   - Consider moving doxazosin dose to night to avoid orthostatic hypotension and help nocturia   - needs follow up CT chest as outpatient in 4-6 weeks as underlying malignancy can not be ruled out , CT cervical spine 10/30  showed possible lytic lesions-not confirmed-  I discussed with daughter  .  - Continue ASA as the patient has bioprosthetic valve replacement , monitor hemoptysis .  - Continue Augmentin until 11/27   -SCDs o DVT prophylaxis     Medical problems are stable medicine will sign off  please call [phone] with questions       ______________________________________________________________________    S: the patient had some  hemoptysis  Yesterday , he is participating with rehab no or shortness of breath, afebrile , no constipation or urinary symptoms , appetite is low .        Scheduled Meds:    amoxicillin/K clavulanate (AUGMENTIN) oral suspension 800 mg 800 mg PEG Tube BID   carvedilol (COREG) tablet 12.5 mg 12.5 mg PEG Tube BID   docusate (COLACE) oral solution 100 mg 100 mg PEG Tube BID   doxazosin (CARDURA) tablet 1 mg 1 mg PEG Tube QDAY   levothyroxine (SYNTHROID) tablet 100 mcg 100 mcg PEG Tube QDAY   montelukast (SINGULAIR) tablet 10 mg 10  mg PEG Tube QDAY   pantoprazole(#) (PROTONIX) suspension 40 mg 40 mg Feeding Tube BID(11-21)   senna (SENOKOT) tablet 2 tablet 2 tablet PEG Tube QHS   sodium chloride PF 0.9% flush 3-5 mL 3-5 mL Intravenous FLUSH TID   Continuous Infusions:  PRN and Respiratory Meds:acetaminophen Q4H PRN, albuterol Q4H PRN, aluminum/magnesium hydroxide Q4H PRN, bisacodyl QDAY PRN, dextromethorphan/guaiFENesin Q4H PRN, milk of magnesia (CONC) Q4H PRN, ondansetron Q6H PRN, pancrelipase 20,000 Units/ sodium bicarbonate 650 mg(#) PRN (On Call from Rx), polyethylene glycol 3350 BID PRN        Vital Signs:  Last Filed in 24 hours Vital Signs:  24 hour Range    BP: 119/47 (11/25 0805)  Temp: 36.3 ???C (97.4 ???F) (11/25 0254)  Pulse: 71 (11/25 0805)  Respirations: 17 PER MINUTE (11/25 0254)  SpO2: 95 % (11/25 0254) O2 Delivery: None (Room Air) (11/25 0254) BP: (119-128)/(47-67)   Temp:  [36.3 ???C (97.4 ???F)-36.6 ???C (97.8 ???F)]   Pulse:  [65-73]   Respirations:  [17 PER MINUTE-18 PER MINUTE]   SpO2:  [95 %-96 %]   O2 Delivery: None (Room Air)     Physical Exam:  Gen: Alert and oriented, cooperative and no distress.   Neck: supple, symmetrical, trachea midline, no adenopathy, no JVD, no bruits,   Back: symmetric, no curvature. ROM normal. No CVA tenderness.   Lungs: clear to auscultation bilaterally, no wheezes, rales, rhonchi   Heart: regular rate and rhythm, S1, S2 normal, no murmur, click, rub or gallop   Abdomen:  PEG tube in place soft, non-tender. Bowel sounds normal. No masses, no organomegaly, non-distended   Extremities: pulses palpable, no pedal edema or skin lesions.    Lab/Radiology/Other Diagnostic Tests:  24-hour labs:    Results for orders placed or performed during the hospital encounter of 12/12/16 (from the past 24 hour(s))   SODIUM-URINE RANDOM    Collection Time: 12/15/16 12:15 PM   Result Value Ref Range    Sodium, Random 75 MMOL/L   OSMOLALITY-URINE RANDOM    Collection Time: 12/15/16 12:15 PM   Result Value Ref Range    Osmolality-Urine 616 50 - 1,400 MOS/KG   OSMOLALITY    Collection Time: 12/15/16 12:45 PM   Result Value Ref Range    Osmolality 280 280 - 307 MOSMOL/KG   CBC CELLULAR THERAPEUTICS    Collection Time: 12/16/16  7:26 AM   Result Value Ref Range    White Blood Cells 9.3 4.5 - 11.0 K/UL    RBC 3.33 (L) 4.4 - 5.5 M/UL    Hemoglobin 10.3 (L) 13.5 - 16.5 GM/DL    Hematocrit 16.1 (L) 40 - 50 %    MCV 94.9 80 - 100 FL    MCH 31.1 26 - 34 PG    MCHC 32.7 32.0 - 36.0 G/DL    RDW 09.6 (H) 11 - 15 %    Platelet Count 322 150 - 400 K/UL    MPV 9.3 7 - 11 FL   BASIC METABOLIC PANEL CELLULAR THERAPEUTICS    Collection Time: 12/16/16  7:26 AM   Result Value Ref Range    Sodium 131 (L) 137 - 147 MMOL/L    Potassium 4.4 3.5 - 5.1 MMOL/L    Chloride 99 98 - 110 MMOL/L    CO2 26 21 - 30 MMOL/L Anion Gap 6 3 - 12    Glucose 109 (H) 70 - 100 MG/DL    Blood Urea Nitrogen 24 7 - 25 MG/DL  Creatinine 1.01 0.4 - 1.24 MG/DL    Calcium 8.8 8.5 - 91.4 MG/DL    eGFR Non African American >60 >60 mL/min    eGFR African American >60 >60 mL/min     Pertinent radiology reviewed.    Mammie Russian, MD  Pager [phone]

## 2016-12-16 NOTE — Progress Notes
Telesitter placed in room d/t patient not using call light to communicate bathroom needs. Patient's bed alarm went off several times during shift while patient was attempting to get out of bed to get to bathroom despite timed voids. Pt is a/o x3 and requires cueing for safety. Family is at bedside throughout most of the day and is able to transfer pt but pt does not call appropriately when family is not at bedside.

## 2016-12-17 LAB — BASIC METABOLIC PANEL CELLULAR THERAPEUTICS: Lab: 132 MMOL/L — ABNORMAL LOW (ref >60)

## 2016-12-17 LAB — CBC CELLULAR THERAPEUTICS: Lab: 8.4 K/UL — ABNORMAL HIGH (ref >60)

## 2016-12-17 NOTE — Case Management (ED)
Case Management Admission Assessment    NAME:Javier Gutierrez                          MRN: [id number]             DOB:13-Mar-1940          AGE: 76 y.o.  ADMISSION DATE: 12/12/2016             DAYS ADMITTED: LOS: 5 days      Today???s Date: 12/17/2016    Source of Information: Pt and pt's son Javier Gutierrez       Plan  Plan: Psychosocial Assessment, Assist PRN with SW/NCM Services, Discharge Planning for Home Anticipated    Patient Address/Phone  [address] [place]  [address]  [place] [address]  [phone] (home)     Emergency Contact  Extended Emergency Contact Information  Primary Emergency Contact: Gutierrez,Javier  Address: [address]           [place], [place] [address] [employer]  Home Phone: [phone]  Mobile Phone: [phone]  Relation: Daughter  Secondary Emergency Contact: Alben Spittle [place]  Home Phone: [phone]  Mobile Phone: [phone]  Relation: Son    Editor, commissioning     Yes, on file.     Transportation  Does the patient need discharge transport arranged?: No  Transportation Name, Phone and Availability #1: Pt's dtr [name] [phone]  Transportation Name, Phone and Availability #2: Pt's son Javier Gutierrez [phone]  Does the patient use Medicaid Transportation?: No    Expected Discharge Date  Expected Discharge Date: 12/26/16    Living Situation Prior to Admission  ? Living Arrangements  Type of Residence: Home, independent  Living Arrangements: Alone  Financial risk analyst / Tub: Tub/Shower Unit  How many levels in the residence?: 1  Can patient live on one level if needed?: Yes  Does residence have entry and/or side stairs?: No (Second floor apartment with no entry steps and reliable elevator access. )  Assistance needed prior to admit or anticipated on discharge: No  Who provides assistance or could if needed?: Pt's dtr [name], son Javier Gutierrez  Are they in good health?: Yes  Can support system provide 24/7 care if needed?: No Pt lives alone in a 2nd floor apartment with no entry steps and reliable elevator access. Pt's apartment complex is a Museum/gallery exhibitions officer, but they do not offer any assistance. Pt's bathroom is accessible by walker, with standard toilet, and tub/shower combo with grab bars and hand held shower head. Pt's adult children, [name] and Javier Gutierrez, are his main supports. Both work full time, but Javier Gutierrez works Holiday representative, so has flexible hours in the winter. Pt has another son, Javier Gutierrez, who [place]. All of the adult children are married, and pt has 4 adult grandchildren, one who is studying to become a Engineer, civil (consulting). Pt's family can provide intermittent support and physical assistance as needed. Pt's son Javier Gutierrez reports that pt will likely d/c to dtr Javier's home upon d/c for a few weeks. Javier's home is single level and accessible. Pt was independent with ADL's, including driving, without device prior to admission. Bill was setting up pt's pill box once weekly, but pt managed all his own finances. Pt owns a roller walker that the family purchased out of pocket prior to admission.     ? Level of Function   Prior level of function: Independent  ? Cognitive Abilities   Cognitive Abilities: Continue to Assess (Pt participated  in assessment, but pt's son had to correct several of his responses. )    Financial Resources  ? Coverage  Primary Insurance: Medicare  Secondary Insurance: Medicaid Pending  Additional Coverage: RX    ? Source of Income   Source Of Income: Other retirement income  ? Financial Assistance Needed?  None    Psychosocial Needs  ? Mental Health  Mental Health History: No  ? Substance Use History  Substance Use History Screen: No  ? Other  None    Current/Previous Services  ? PCP  Steva Ready, [phone], [phone]   Pt reports seeing his PCP within the last 2 months.     ? Pharmacy    [facility] [address], [place] [address] [place] [address] [place] [address]  [place] [address] Phone: [phone] Fax: [phone]    ? Durable Medical Equipment   Durable Medical Equipment at home: Bernardo Heater (Pt's family purchased a RW out of pocket before admission. )  ? Home Health  Receiving home health: No  ? Hemodialysis or Peritoneal Dialysis  Undergoing hemodialysis or peritoneal dialysis: No  ? Tube/Enteral Feeds  Receive tube/enteral feeds: No  ? Infusion  Receive infusions: No  ? Private Duty  Private duty help used: No  ? Home and Community Based Services  Home and community based services: No  ? [name]  [name]: No  ? Hospice  Hospice: No  ? Outpatient Therapy  PT: In the past  When did patient receive care?: July 2017 Cardiac Rehab  Name of rehab location/group: [facility]  Would patient return for future services?: Yes  OT: No  SLP: No  ? Skilled Nursing Facility/Nursing Home  SNF: No  NH: No  ? Inpatient Rehab  IPR: No  ? Long-Term Acute Care Hospital  LTACH: No  ? Acute Hospital Stay  Acute Hospital Stay: Yes  Was patient's stay within the last 30 days?: Yes  When did patient receive care?: Novmeber 2018  Name of hospital: Karilyn Cota, LMSW  Phone: [phone]  Pager: [phone]

## 2016-12-17 NOTE — Progress Notes
Heart Failure Nursing Progress Note    Admission Date: 12/12/2016  LOS: 5 days    Admission Weight: 60.5 kg (133 lb 6.1 oz)        Most recent weights (inpatient):   Vitals:    12/15/16 0700 12/16/16 0600 12/17/16 [id number]   Weight: 57.8 kg (127 lb 6.4 oz) 59.2 kg (130 lb 9.6 oz) 59.4 kg (130 lb 14.4 oz)     Weight change from previous day:+0.2kg    Fluid restriction ordered: N/A    Intake/Output Summary: (Last 24 hours)    Intake/Output Summary (Last 24 hours) at 12/17/16 1613  Last data filed at 12/17/16 1520   Gross per 24 hour   Intake             2570 ml   Output              500 ml   Net             2070 ml       Is patient incontinent No    Anticipated discharge date: TBD  Discharge goals: TBD      Daily Assessment of Patient Stated Goals:    Short Term Goal Identified by patient (Short Term=during hospitalization):  swallow strategies, walk around unit with staff or family

## 2016-12-17 NOTE — Progress Notes
[facility]   Progress Note  Date of Service:  12/17/2016      Clinical Impression:  1.  Hemoptysis, small volume (resolved)  2.  Left-sided Empyema s/p Chest Drain; intrapleural TPA/Dornase; Drain removal (11/11)  3.  Hospital-Acquired pneumonia, involving the right lung  4.  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  History of CVA; Dementia  6.  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  Acute small Pulmonary Embolism (superior RLL subsegmental branches)  ???  Recommendations:  --- Resolution of hemoptysis over the weekend. Some dark sputum production continues, which may reflect necrotizing PNA. Family and patient describe significant improvement in cough and no longer to be bothersome.   --- Patient described feeling much improved from a respiratory standpoint and appears much improved overall.  >> Recommend repeat CTA Chest in 4 weeks to further assess both lung parenchyma and for resolution/persistance of small pulmonary thromboembolism. If PE unresolved, will re-evaluate to determine need/appropriateness for anticoagulation.If patient is discharged home before that time, please arrange follow up with Pulmonary Clinic for hospital follow up and CTA Chest as an outpatient.  >> Will sign off at this time. Please call with additional questions or concerns.    ???  Pt seen and discussed with Dr. Cedric Fishman  ???  Pricilla Loveless, MD  Fellow, Pulmonary & Critical Care  Pager: [phone]    ________________________________________________________________________    Subjective  Javier Gutierrez is a 76 y.o. male.  Patient reports feeling well today and is much more upbeat than when seen last week.  He is sitting upright in a chair.  He describes engaging in intensive rehabilitation therapies and feels he is getting stronger by the day.  Family members report much improved frequency of his cough.  His sputum production remains dark at times as evidenced by suction canister and was wall.  Overall, his cough has improved and he denies any bright red streaking of his expectorant.    Review of Systems:  Gen: Negative for fevers, chills, sweats, weakness and fatigue   Resp: Negative for shortness of breath at rest   Positive for cough, sputum  Cardio: Negative for chest pain, palpitations, orthopnea       Medications  Scheduled Meds:  amoxicillin/K clavulanate (AUGMENTIN) oral suspension 800 mg 800 mg PEG Tube BID   aspirin chewable tablet 81 mg 81 mg PEG Tube QDAY   carvedilol (COREG) tablet 12.5 mg 12.5 mg PEG Tube BID   docusate (COLACE) oral solution 100 mg 100 mg PEG Tube BID   doxazosin (CARDURA) tablet 1 mg 1 mg PEG Tube QDAY   levothyroxine (SYNTHROID) tablet 100 mcg 100 mcg PEG Tube QDAY   montelukast (SINGULAIR) tablet 10 mg 10 mg PEG Tube QDAY   pantoprazole(#) (PROTONIX) suspension 40 mg 40 mg Feeding Tube BID(11-21)   senna (SENOKOT) tablet 2 tablet 2 tablet PEG Tube QHS   sodium chloride PF 0.9% flush 3-5 mL 3-5 mL Intravenous FLUSH TID   Continuous Infusions:  PRN and Respiratory Meds:acetaminophen Q4H PRN, albuterol Q4H PRN, aluminum/magnesium hydroxide Q4H PRN, bisacodyl QDAY PRN, dextromethorphan/guaiFENesin Q4H PRN, milk of magnesia (CONC) Q4H PRN, ondansetron Q6H PRN, pancrelipase 20,000 Units/ sodium bicarbonate 650 mg(#) PRN (On Call from Rx), polyethylene glycol 3350 BID PRN      Objective:                          Vital Signs: Last Filed  Vital Signs: 24 Hour Range   BP: 121/54 (11/26 0853)  Temp: 36.1 ???C (97 ???F) (11/26 0456)  Pulse: 71 (11/26 0853)  Respirations: 18 PER MINUTE (11/26 0456)  SpO2: 93 % (11/26 0456)  O2 Delivery: None (Room Air) (11/26 0456) BP: (109-125)/(54-91)   Temp:  [36.1 ???C (97 ???F)-36.9 ???C (98.4 ???F)]   Pulse:  [68-75]   Respirations:  [18 PER MINUTE]   SpO2:  [93 %-96 %]   O2 Delivery: None (Room Air)     Vitals:    12/15/16 0700 12/16/16 0600 12/17/16 0605 Weight: 57.8 kg (127 lb 6.4 oz) 59.2 kg (130 lb 9.6 oz) 59.4 kg (130 lb 14.4 oz)       Intake/Output Summary:  (Last 24 hours)    Intake/Output Summary (Last 24 hours) at 12/17/16 1419  Last data filed at 12/17/16 1120   Gross per 24 hour   Intake             2320 ml   Output              500 ml   Net             1820 ml      Stool Occurrence: 1    Physical Exam:   Gen: More alert. Alert, cooperative, no distress, appears stated age  HENT:   Normocephalic. Conjunctivae clear. Supple neck.    Lungs:   Right lung clear to auscultation               Left base with decreaed air movement and intermittent rales  Heart:    Regular rate and rhythm, S1, S2 normal,                BLE without edema.   Abd:      Soft, non-tender.  Bowel sounds normal.  No palpable masses.  Ext:       No clubbing or cyanosis appreciated.  Neuro:   Appear to have difficulty articulating extended detailed thoughts/ideas.  Pulses: 2+ and symmetric   Skin:     No rashes appreciated  ???  ???  Lab Review:       Pertinent labs reviewed  ???  Radiology Review:  Pertinent imaging studies reviewed  ???  ???  Pricilla Loveless, MD  Pager [phone]

## 2016-12-17 NOTE — Progress Notes
Heart Failure Nursing Progress Note    Admission Date: 12/12/2016  LOS: 5 days    Admission Weight: 60.5 kg (133 lb 6.1 oz)        Most recent weights (inpatient):   Vitals:    12/15/16 0700 12/16/16 0600 12/17/16 [id number]   Weight: 57.8 kg (127 lb 6.4 oz) 59.2 kg (130 lb 9.6 oz) 59.4 kg (130 lb 14.4 oz)     Weight change from previous day: +0.2kg    Fluid restriction ordered: N/A- 100 ml free water q6h per PEG    Intake/Output Summary: (Last 24 hours)    Intake/Output Summary (Last 24 hours) at 12/17/16 [id number]  Last data filed at 12/17/16 0417   Gross per 24 hour   Intake             1570 ml   Output              600 ml   Net              970 ml       Is patient incontinent No    Anticipated discharge date: TBD  Discharge goals: TBD      Daily Assessment of Patient Stated Goals:    Short Term Goal Identified by patient (Short Term=during hospitalization):  Participate in plan of care (Cues for safety, swallow strategies, walk laps around unit w/ staff or family, timed voids)

## 2016-12-17 NOTE — Progress Notes
Heart Failure Nursing Progress Note    Admission Date: 12/12/2016  LOS: 4 days    Admission Weight: 60.5 kg (133 lb 6.1 oz)        Most recent weights (inpatient):   Vitals:    12/14/16 0615 12/15/16 0700 12/16/16 0600   Weight: 61.4 kg (135 lb 6.4 oz) 57.8 kg (127 lb 6.4 oz) 59.2 kg (130 lb 9.6 oz)     Weight change from previous day: + 1.4 kg    Fluid restriction ordered: N/A - 100 ml free water q6h per PEG    Intake/Output Summary: (Last 24 hours)    Intake/Output Summary (Last 24 hours) at 12/16/16 1841  Last data filed at 12/16/16 1755   Gross per 24 hour   Intake             1560 ml   Output              675 ml   Net              885 ml       Is patient incontinent No    Anticipated discharge date: TBD  Discharge goals: TBD      Daily Assessment of Patient Stated Goals:    Short Term Goal Identified by patient (Short Term=during hospitalization):  cues for safety, swallow strategies, walk around unit with staff or family, timed voids

## 2016-12-18 ENCOUNTER — Inpatient Hospital Stay: Admit: 2016-12-18 | Discharge: 2016-12-18 | Payer: MEDICARE

## 2016-12-18 MED ORDER — BARIUM SULFATE 40 % (W/V) PO SUSP
10 mL | Freq: Once | ORAL | 0 refills | Status: CP
Start: 2016-12-18 — End: ?
  Administered 2016-12-18: 20:00:00 10 mL via ORAL

## 2016-12-18 MED ORDER — DOXAZOSIN 2 MG PO TAB
1 mg | Freq: Every evening | GASTROSTOMY | 0 refills | Status: DC
Start: 2016-12-18 — End: 2016-12-26
  Administered 2016-12-19 – 2016-12-26 (×8): 1 mg via GASTROSTOMY

## 2016-12-18 MED ORDER — BARIUM SULFATE 40 % (W/V) PO POWD
10 mL | Freq: Once | ORAL | 0 refills | Status: CP
Start: 2016-12-18 — End: ?
  Administered 2016-12-18: 20:00:00 10 mL via ORAL

## 2016-12-18 MED ORDER — BARIUM SULFATE 40 % (W/V) 29% (W/W) PO SUSP
10 mL | Freq: Once | ORAL | 0 refills | Status: CP
Start: 2016-12-18 — End: ?
  Administered 2016-12-18: 20:00:00 10 mL via ORAL

## 2016-12-18 MED ORDER — BARIUM SULFATE 40 % (W/V), 30% (W/W) PO PSTE
10 mL | Freq: Once | ORAL | 0 refills | Status: CP
Start: 2016-12-18 — End: ?
  Administered 2016-12-18: 20:00:00 10 mL via ORAL

## 2016-12-18 NOTE — Progress Notes
Pt remained on TS during this RN shift. Pt did not attempt to get out of bed. Family at bedside throughout most of shift.

## 2016-12-18 NOTE — Progress Notes
Pt A&Ox4. Patient allergy band in place.Pt ID band verified with patient.  Profile completed, Fall risk in place, care plan/education updated,  call light within reach

## 2016-12-18 NOTE — Care Coordination-Inpatient
Palliative care note:      Patient not seen today. Chart reviewed, noted that patient is doing well, participating with rehab, labs and vital signs stable with tentative rehab dc date on 12/5. Our team is continuing to follow peripherally. Please page/call if there's any palliative care needs we can be helpful with.      Agrifina   ANP-BC, [facility]  Palliative Care Nurse Practitioner  Available on [employer]alte  Office: [phone]  Nights/Weekends - Page [phone] for Palliative Care On-Call

## 2016-12-18 NOTE — Rehab Team Weekly Goals
Family training  Determine d/c plan   Timed voids to encourage continence

## 2016-12-18 NOTE — Progress Notes
Heart Failure Nursing Progress Note    Admission Date: 12/12/2016  LOS: 6 days    Admission Weight: 60.5 kg (133 lb 6.1 oz)        Most recent weights (inpatient):   Vitals:    12/16/16 0600 12/17/16 0605 12/18/16 0647   Weight: 59.2 kg (130 lb 9.6 oz) 59.4 kg (130 lb 14.4 oz) 60.5 kg (133 lb 6.1 oz)     Weight change from previous day:+0.9Kg    Fluid restriction ordered: None    Intake/Output Summary: (Last 24 hours)    Intake/Output Summary (Last 24 hours) at 12/18/16 [id number]  Last data filed at 12/18/16 0600   Gross per 24 hour   Intake             2710 ml   Output              725 ml   Net             1985 ml       Is patient incontinent No    Anticipated discharge date: TBD  Discharge goals: Feel stronger      Daily Assessment of Patient Stated Goals:    Short Term Goal Identified by patient (Short Term=during hospitalization):  Swallow better, walk with family

## 2016-12-18 NOTE — Progress Notes
SPEECH-LANGUAGE PATHOLOGY  VIDEOSWALLOW ASSESSMENT   ???  EVALUATION SUMMARY  Videoswallow Summary*: A videoswallow study was completed this date. Minimal improvement noted since previous VSS completed 12/04/16. Results suggest moderate/severe oropharyngeal dysphagia characterized by trace silent aspiration thin and nectar thick liquids before to during the swallow secondary to mistimed airway protection and incomplete laryngeal vestibular closure. Aspiration events noted to be silent with no cough response appreciated until larger amount of volume was observed in the trachea, then a weak cough was observed. Cough noted to be weak and penetration/aspiration was not eliminated. However, pt continued to present with inconsistent penetration/aspiration nectar as well as increased residue as viscosity increased (nectar, pudding barium). Other strategies attempted, but with minimal- no improvement noted. A chin tuck demo'ed detrimental effects, causing aspiration w/ thin liquids. A left head turn w/ nectar thick liquids did appear to be free from aspiration, however, this did increase his residue which he then aspirated following the swallow on an attempt to clear. Continue to anticipate these to be cumulative over course of meal and pt also with increased pharyngeal residue and difficulty clearing. Anticipate generalized weakness/debility superimposed on baseline dysphagia consistent with COPD and dementia to be primary source of dysphagia. Continue to question prognosis for improvement in swallow in near future.  ???  RECOMMENDATIONS  NPO with continued use of PEG  Nectar thick liquid trials w/ strict use of left head turn in therapy ONLY  Excellent oral care  Ice chip protocol (10-15 per hour, only as tolerated)  Continue dysphagia/cognitive tx per Rehab POC  ???  MBSImp Scale:   ???  Lip closure for intraoral bolus containment resulted in no labial escape. Tongue control during bolus hold allowed posterior escape of greater than half the bolus. Bolus preparation and mastication could not be assessed. No solid was given do to safety concerns or logistical reasons not related to mastication. Bolus transport/lingual motion was with repetitive/disorganized motion of the tongue. Oral residue was a collection on oral structures.   ???  Initiation of the pharyngeal swallow occured when the bolus head was in the pyriform sinuses. Soft palate elevation resulted in no bolus between the soft palate and the pharyngeal wall. Laryngeal elevation was decreased, with partial superior movement of the thyoid cartilage/partial approximation of the arytenoids to the epigoittic  petiole. Anterior hyoid excursion demonstrated partial anterior movement. Epiglottic movement resulted in no inversion. Laryngeal vestibular closure was decreased, resulting in a wide-narrow column of air/contrast within the laryngeal vestibule of the height of the swallow. Pharyngeal stripping wave was present, but diminished. Pharyngeal contraction could not be assessed due to logistical reasons not related to physiologic impairment. Pharyngoesophageal segment opening demonstrated minimal distention/minimal duration, with partial obstruction of bolus flow. Tongue base retraction allowed a narrow column of contrast of air between the retracted tongue base and the posterior pharyngeal wall. Pharyngeal residue was the moderate of contrast within or on pharyngeal structures (vallecular space and along the posterior pharyngeal wall, as viscosity increased). Esophageal clearance in the upright position could not be assessed due to logistical reasons not related to physiologic impairment.   ???  Plan*: Continue Treatment per Rehab POC  ???  Prognosis*: Guarded  ???  NOMS Dysphagia Rating*: 2-Moderately-Severe Dysphagia -Not able to swallow safely by mouth for nutrition/hydration but may take some consistency w/ consistent max cues in therapy only. Alternative method of feeding required.  ???  Penetration Aspiration Scale*: 8 - Material enters laryngeal vestibule, passes below vocal folds & no effort is made to  eject  ???  Objective*  Relevant Med Background: 76 year old man with history of hypertension, hyperlipidemia, coronary artery disease, aortic stenosis, chronic kidney disease, peripheral artery disease, and lifetime smoker who presents with concern for weakness and cough.  Found to have complicated empyema.  Chest tube was inserted.  Pulmonary treated the empyema with TPA/dornase.  CT surgery decided there is no surgical indication for decortication.  There was concern on the CT for possible obstruction pneumonia and infection disease recommended to have bronchoscopy.  ???  Lives With: Alone  Receives Help From: Family  Psychosocial Status: Willing and Cooperative to Participate  Persons Present: Son  ???  Subjective*  Pain: Patient has no complaint of pain  Trach Presence: No  Feeding Tube Present During Eval: PEG  ???  Nutrition*  Nutrition Prior To Hospitalization: Oral, Regular, Thin Liquids  Current Form Of Nutrition: NPO, PEG  ???  Views / Programmer, applications*  Views / Seating: Lateral View, Sitting at 90 Degrees Upright  ???  Barium Consist/Presentation*  Presentations: Therapist Fed, Patient Fed Self  Thin Liquid: 1/2 Tsp  Nectar Thick Liquid: 1 Tsp, Cup  Other Consistencies: Pudding  ???  Swallow Strategies  Small Bites and Sips: Ineffective  Slow Rate of Intake: Ineffective  Alternate Solids / Liquids: Not Effective  Multiple Swallows: Not Effective  Chin Down: Not Effective  Cough / Throat Clear: Not Effective (weak )  Effortful Swallow: Not Effective  ???  Education*  Persons Educated: Pt/Family  Barriers To Learning: Cognitive Deficits  Interventions: Family Educated, Staff Educated  Teaching Methods: Verbal  Topics: Dysphagia  Patient Response: Verbalized Understanding  Goal Formulation: With Pt/Family  ???  Videoswallow Goals* Goal : Pt will tolerate nectar thick liquid trials w/ use of left head turn with less than 10% overt s/s aspiration and free from respiratory status changes.   Goal : Pt will complete pharyngeal strengthening exercises with 70% accuracy and minimal cues. [name] Kitchen    ???  Therapist: Alessandra Bevels, MS, L/CCC-SLP, [phone]  Date: 12/18/2016

## 2016-12-18 NOTE — Progress Notes
Pt did not require prompting from the telesitter on this shift. Pt did not use call light at all this shift but did use the urinal himself. Pt slept well and watched TV in the morning calmly without being restless.

## 2016-12-18 NOTE — Rehab Team PT Barriers and Concerns
Pt is far form baseline and with unknown etiology of deficits, thus unsure of prognosis for recovery - may require more long term assistance  Pt frequently declines participation in therapy, requires max coaxing or distraction to participate

## 2016-12-18 NOTE — Progress Notes
Heart Failure Nursing Progress Note    Admission Date: 12/12/2016  LOS: 6 days    Admission Weight: 60.5 kg (133 lb 6.1 oz)        Most recent weights (inpatient):   Vitals:    12/16/16 0600 12/17/16 0605 12/18/16 0647   Weight: 59.2 kg (130 lb 9.6 oz) 59.4 kg (130 lb 14.4 oz) 60.5 kg (133 lb 6.1 oz)     Weight change from previous day: +1.1kg    Fluid restriction ordered: N/A    Intake/Output Summary: (Last 24 hours)    Intake/Output Summary (Last 24 hours) at 12/18/16 1552  Last data filed at 12/18/16 1451   Gross per 24 hour   Intake             1910 ml   Output              775 ml   Net             1135 ml       Is patient incontinent No    Anticipated discharge date: TBD  Discharge goals: TBD      Daily Assessment of Patient Stated Goals:    Short Term Goal Identified by patient (Short Term=during hospitalization):  Swallow strategies, walk around unit with staff or family

## 2016-12-19 LAB — BASIC METABOLIC PANEL CELLULAR THERAPEUTICS: Lab: 132 MMOL/L — ABNORMAL LOW (ref 137–147)

## 2016-12-19 LAB — CBC CELLULAR THERAPEUTICS: Lab: 9.3 K/UL — ABNORMAL LOW (ref 4.5–11.0)

## 2016-12-19 NOTE — Progress Notes
Heart Failure Nursing Progress Note    Admission Date: 12/12/2016  LOS: 7 days    Admission Weight: 60.5 kg (133 lb 6.1 oz)        Most recent weights (inpatient):   Vitals:    12/18/16 0647 12/19/16 0558 12/19/16 0600   Weight: 60.5 kg (133 lb 6.1 oz) 63.1 kg (139 lb 1.6 oz) 60.5 kg (133 lb 6.4 oz)     Weight change from previous day: + 0.3 oz    Fluid restriction ordered: none    Intake/Output Summary: (Last 24 hours)    Intake/Output Summary (Last 24 hours) at 12/19/16 0730  Last data filed at 12/19/16 0600   Gross per 24 hour   Intake             2050 ml   Output              700 ml   Net             1350 ml       Is patient incontinent No    Anticipated discharge date: TBD  Discharge goals: to feel stronger      Daily Assessment of Patient Stated Goals:    Short Term Goal Identified by patient (Short Term=during hospitalization):  To feel good again

## 2016-12-19 NOTE — Progress Notes
RT Adult Assessment Note    NAME:Javier Gutierrez             MRN: [id number]             DOB:08/07/1940          AGE: 76 y.o.  ADMISSION DATE: 12/12/2016             DAYS ADMITTED: LOS: 7 days    RT Treatment Plan:  Protocol Plan: Medications  Albuterol: MDI BID    Protocol Plan: Procedures  PAP: Place a nursing order for "IS Q1h While Awake" for any of Lung Expansion indicators    Additional Comments:  Impressions of the patient: No complications    Vital Signs:  Pulse: Pulse: 76  RR: Respirations: 16 PER MINUTE  SpO2: SpO2: 95 %  O2 Device:    Liter Flow:    O2%: O2 Percent: 21 %  Breath Sounds: All Breath Sounds: Clear (implies normal)  Respiratory Effort: Respiratory Effort: Non-Labored

## 2016-12-19 NOTE — Progress Notes
SPEECH-LANGUAGE PATHOLOGY  REHAB SPEECH DAILY NOTE     SUMMARY: Pt seen for two 30 minute sessions to address dysphagia and cognition.     PLAN: Continue current therapy plan.     FREQUENCY TODAY: 2x     RECOMMENDATIONS  Remain NPO w/ use of PEG for nutrition/hydration/medications.  Frequent oral care (2-3x/daily)  Ice chips (10-15/hour), as tolerated. [facility]    ACTIVITIES FOR THE DAY:  Dysphagia:  Facilitated effortful swallow exercises with minimal (<1/4 tsp) nectar-thick bolus + left head turn x80 across two sessions w/ min-mod verbal cues required. One delayed, weak cough response noted.    Cognition:  O-Log = 19/20. Pt required use of external aids for orientation to time. Unable to report accurate reason for hospitalization or insight into current deficits.  Completed concrete, simplified divergent naming tasks while targeting dysphagia. Pt demonstrated reduced sustained attention and thought organization, achieving 50% accuracy independently and requiring up to max cues to resolve errors/generate alternatives.     Speech Long Term Goals  Will improve skills necessary for ADI's and safety in the home with: Moderate assist  Patient will exhibit safe swallow for least restrictive consistency: Progressing    Speech Short Term Goals  Will demonstrate memory for safety sequences for transfer and ADLs with: 80% accuracy, Moderate cues  Will perform dysphagia exercises with: 80% accuracy, Moderate cues  Will participate in repeat instrumental swallow evaluation when clinically indicated: 100% accuracy, Moderate cues  Patient will tolerate therapeutic trials of nectar liquids: 80% accuracy, Minimal cues    Therapist: Lendell CapriceLauren , M.S., L/CCC-SLP  Date: 12/19/2016

## 2016-12-19 NOTE — Rehab Team Conference
Team Conference Note     Date of Admission:  12/12/2016  Date of Team Conference:  12/19/2016   Javier Gutierrez is a 76 y.o. male.     DOB: 1940/02/16                  MRN#:  [id number]    Team Conference   Attendees: Jana Hakim MD, Attending Physician; Sande Brothers MD, Resident Physician; Gibson Ramp MD, Resident Physician; Alli McGuinn LMSW, Social Work; Lonia Blood PharmD, Pharmacy; Mellody Dance PhD, ABPP, Neuropsychology; Marlyn Corporal PsyD, Neuropsychology Fellow; Lavonna Rua MA, Neuropsychology Intern; Sheria Lang RN, Nurse Manager; Vernie Ammons OTR, Occupational Therapy; Soledad Gerlach DPT, Physical Therapy; Lendell Caprice SLP, Speech Therapy; Michaelyn Barter RN, Rehabilitation Program Coordinator; Dorna Mai RD LD, Clinical Nutrition; Alphonzo Lemmings RN, Nursing    Medical Update: 76 yo M with PMH of diastolic CHF, AS s/p TAVR (2017), CAD, CKD, HTN, who presented to the ED 10/30 w/ weakness and SOB. CT chest revealed left sided lung consolidation concerning for PNA w/ effusion + suspicious lung mass with ongoing hemoptysis, now improved. Presents to rehab with debility secondary to complicated parapneumonic effusion. He completed Augmentin course yesterday.   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 Poor impulse control.     Team Goal:  Patient will perform: community mobility, at ambulation level, Independent (discontinue, new goal: supervision)   Pt will perform basic care and transfer with: Modified independence (least restrictive device)     Discharge Planning     Discharge Date:  12/26/16    Living Situation Prior to Admission  ??? Living Arrangements Type of Residence: Home, independent  Living Arrangements: Alone  Financial risk analyst / Tub: Tub/Shower Unit  How many levels in the residence?: 1  Can patient live on one level if needed?: Yes  Does residence have entry and/or side stairs?: No (Second floor apartment with no entry steps and reliable elevator access. )  Assistance needed prior to admit or anticipated on discharge: No  Who provides assistance or could if needed?: Pt's dtr Javier Gutierrez, son Javier Gutierrez  Are they in good health?: Yes  Can support system provide 24/7 care if needed?: No   ???  Pt lives alone in a 2nd floor apartment with no entry steps and reliable elevator access. Pt's apartment complex is a Museum/gallery exhibitions officer, but they do not offer any assistance. Pt's bathroom is accessible by walker, with standard toilet, and tub/shower combo with grab bars and hand held shower head. Pt's adult children, Javier Gutierrez and Javier Gutierrez, are his main supports. Both work full time, but Javier Gutierrez works Holiday representative, so has flexible hours in the winter. Pt has another son, Javier Gutierrez, who [place]. All of the adult children are married, and pt has 4 adult grandchildren, one who is studying to become a Engineer, civil (consulting). Pt's family can provide intermittent support and physical assistance as needed. Pt's son Javier Gutierrez reports that pt will likely d/c to dtr Javier Gutierrez's home upon d/c for a few weeks. Javier Gutierrez's home is single level and accessible. Pt was independent with ADL's, including driving, without device prior to admission. [name] was setting up pt's pill  box once weekly, but pt managed all his own finances. Pt owns a roller walker that the family purchased out of pocket prior to admission.     Plan Ongoing home health OT/PT/SLP    DME tub transfer bench, potentially wheelchair/transport chair pending discussion with patient and family    Rehabilitation Plan     Progress  Great family support  Pt is able to safely ambulate and complete ADLs in room with family Videoswallow 11/28: Minimal improvement noted since previous VSS completed 12/04/16 - moderate/severe oropharyngeal dysphagia characterized by trace silent aspiration thin and nectar thick liquids before to during the swallow secondary to mistimed airway protection and incomplete laryngeal vestibular closure. Difficulty clearing penetrated/aspirated material or pharyngeal residue. Strategies minimally effective.  Question pt's ability/safety to return to PTA level of independence.    Barriers/Concerns  Pt is far form baseline and with unknown etiology of deficits, thus unsure of prognosis for recovery - may require more long term assistance  Pt frequently declines participation in therapy, requires max coaxing or distraction to participate  Mod-severe oropharyngeal dysphagia in relation to current respiratory status compounded by debility.   At least moderate cognitive deficits within areas of orientation, insight, attention, recall, and auditory comprehension.  pt has a visual impairment and is hard of hearing. He sometimes has difficulty expressing himself and can be confused at times.  Possibly depressed about current condition.    Plan  passive vector treadmill, core and LE strengthening, postural reed and strenghthening, Pilates, gait with no AD   Pt did not complain of pain. PT is a Q2 turn with a stage 1 on his coccyx. Pt can be confused at times and needs some prompting. Pt is monitored via the telesitter.   Family training with tube feedings  Determine d/c plan  Timed voids to encourage continence  Performance Day Tuesday 12/4    Goals   Speech Long Term Goals  Will improve skills necessary for ADI's and safety in the home with: Moderate assist  Patient will exhibit safe swallow for least restrictive consistency: Progressing  Weekly Goals  Weekly Bed Mobility Goals: Patient will complete rolling with, Patient will perform sit to supine with, Patient will perform supine to sit with Patient will perform rolling with: Stand by assistance, Progressing  Patient will perform sit to supine with: Stand by assistance, Progressing  Patient will perform supine to sit with: Stand by assistance, Progressing  Weekly Transfer Goals: Patient will complete sit to stand transfer with, Patient will complete stand to sit transfer with, Patient will complete stand pivot transfer with  Patient will complete sit to stand transfer with: Stand by assistance, Progressing  Patient will complete stand to sit transfer with: Stand by assistance, Progressing  Patient will complete stand pivot transfer with: Stand by assistance, Progressing  Weekly Ambulation/Stairs Goals: Patient will ambulate, Patient will ascend/descend  Patient will ambulate: 500 feet, No device, Stand by assistance, Progressing  Patient will ascend/descend: 12 stairs, Stand by assistance, Progressing  Weekly Goals  ADL Goals: Bathing, Dressing UE, Dressing LE, Grooming, Toileting  Patient Will Perform UE Dressing: w/ Stand By Assist  Patient Will Perform LE Dressing: w/ Minimum Assist  Patient Will Perform Grooming: Standing at Sink, w/ Minimum Assist  Patient Will Perform Toileting: w/ Stand By Assist  Pt Will Perform All Functional Transfers: w/ Stand By Assist  Speech Short Term Goals  Will demonstrate memory for safety sequences for transfer and ADLs with: 80% accuracy, Moderate cues  Will perform dysphagia  exercises with: 80% accuracy, Moderate cues  Will participate in repeat instrumental swallow evaluation when clinically indicated: 100% accuracy, Moderate cues  Patient will tolerate therapeutic trials of nectar liquids: 80% accuracy, Minimal cues  Nursing Short Term Goals  Bladder Management: Yes  Will decrease the number of bladder accidents during the day and/or night to: No accidents, Progressing  Patient will follow time voiding program independently with: Moderate verbal cues  Bowel Management: Yes Will verbalize need to have a bowel movement daily/every other day (for constipation) with: Moderate verbal cues  Patient / Caregiver will verbalize knowledge of activity in relation to constipation with: Moderate verbal cues  Medication Management: Yes  Will verbalize reason for medication with: Moderate verbal / written cues  Will verbalize dose and time for medication with: Moderate verbal / written cues  Will verbalize side effects of medication with: Moderate verbal / written cues  Skin Integrity: Yes  Will verbalize optimal skin care routine with: Moderate verbal cues  Will verbalize 2-3 risk factors for pressure ulcers with: Moderate verbal / written cues  Will be able to call for pressure reliefs every 30 minutes when up in chair with: Moderate verbal / written cues  Pain Management: No  Safety: Yes  Will demonstrate understanding of mobility precautions with: Minimum verbal cues  Will demonstrate understanding of own limitations with: Moderate verbal cues  Will verbalize the importance of using call light with: Moderate verbal cues    Functional Independence Measures   Eating FIM: 1 - Total assistance, helper feeds patient  Grooming FIM: 3 - Moderate assistance, patient performs 50-74% or more of grooming/bathing/dressing/toileting tasks  Bathing FIM: 1 - Total assistance, patient performs less than 25% of grooming/bathing/dressing/toileting tasks  Dressing - Upper Body FIM: 4 - Minimal contact assistance, patient performs 75% or more of grooming/bathing/dressing/toileting tasks  Dressing - Lower Body FIM: 3 - Moderate assistance, patient performs 50-74% or more of grooming/bathing/dressing/toileting tasks  Toileting FIM: 2 - Maximal assistance, patient performs 25-49% of grooming/bathing/dressing/toileting tasks  Bladder FIM: 5 - Emptying device by staff  Bowel FIM: 5 - Verbal Cues  Transfers FIM: 4 - Contact guard assistance  Toilet Transfers FIM: 4 - Minimal assistance, patient performs 75% or more of transferring tasks   Shower Transfers FIM: 3 - Moderate assistance, patient performs 50-74% of transferring tasks (lifting required)  Gait: 4 - Minimal contact assistance, patient expends 75% or more effort, greater than or equal to 150 feet  Stairs FIM: 4 - Minimal contact guard assistance, greater than or equal to 12 stairs  Comprehension FIM: 4 - Minimal prompting. Understands directions/conversation about basic daily needs 75-90%  Expression FIM: 4 - Minimal prompting - Expresses directions/conversation about basic daily needs 75-90%  Social Interaction FIM: 3 - Moderate direction - Able to interact 50-74%  Problem Solving FIM: 3 - Moderate prompting - Able to solve routine problems 50-74%. Needs direction less than half of the time to initiate, plan or complete daily activities  Memory FIM: 3 - Moderate promting - Able to recognize people frequently encountered, remembers daily routines, responds to requests of others 50-74% of the time, needs prompting less than half of the time

## 2016-12-19 NOTE — Rehab Team Social Worker/Case Management Support/Ongoing Services/DME
Living Situation Prior to Admission   Living Arrangements  Type of Residence: Home, independent  Living Arrangements: Alone  Financial risk analystBathroom Shower / Tub: Tub/Shower Unit  How many levels in the residence?: 1  Can patient live on one level if needed?: Yes  Does residence have entry and/or side stairs?: No (Second floor apartment with no entry steps and reliable elevator access. )  Assistance needed prior to admit or anticipated on discharge: No  Who provides assistance or could if needed?: Pt's dtr Javier Gutierrez, son Javier Gutierrez  Are they in good health?: Yes  Can support system provide 24/7 care if needed?: No     Pt lives alone in a 2nd floor apartment with no entry steps and reliable elevator access. Pt's apartment complex is a Museum/gallery exhibitions officersenior community, but they do not offer any assistance. Pt's bathroom is accessible by walker, with standard toilet, and tub/shower combo with grab bars and hand held shower head. Pt's adult children, Javier Gutierrez and Javier Gutierrez, are his main supports. Both work full time, but Javier Gutierrez works Holiday representativeconstruction, so has flexible hours in the winter. Pt has another son, Javier Gutierrez, [place]. All of the adult children are married, and pt has 4 adult grandchildren, one who is studying to become a Engineer, civil (consulting)nurse. Pt's family can provide intermittent support and physical assistance as needed. Pt's son Javier Gutierrez reports that pt will likely d/c to dtr [name]'s home upon d/c for a few weeks. [name]'s home is single level and accessible. Pt was independent with ADL's, including driving, without device prior to admission. [name] was setting up pt's pill box once weekly, but pt managed all his own finances. Pt owns a roller walker that the family purchased out of pocket prior to admission.

## 2016-12-19 NOTE — Progress Notes
Pt tried to get out of bed more than once this shift. He used his call light appropriately once this shift. Otherwise called for help vocally from bed. Pt remains on Telesitter for poor safety awareness.

## 2016-12-19 NOTE — Progress Notes
Pt remained on TS throughout this RN shift. Pt tried to get out of bed x1 today, was easily redirected by TS. Pt son at bedside throughout most of day.

## 2016-12-20 NOTE — Progress Notes
Heart Failure Nursing Progress Note    Admission Date: 12/12/2016  LOS: 7 days    Admission Weight: 60.5 kg (133 lb 6.1 oz)        Most recent weights (inpatient):   Vitals:    12/18/16 0647 12/19/16 0558 12/19/16 0600   Weight: 60.5 kg (133 lb 6.1 oz) 63.1 kg (139 lb 1.6 oz) 60.5 kg (133 lb 6.4 oz)     Weight change from previous day:0    Fluid restriction ordered: non    Intake/Output Summary: (Last 24 hours)    Intake/Output Summary (Last 24 hours) at 12/19/16 1815  Last data filed at 12/19/16 1359   Gross per 24 hour   Intake             1540 ml   Output              550 ml   Net              990 ml       Is patient incontinent No    Anticipated discharge date: TBD  Discharge goals: get stronger      Daily Assessment of Patient Stated Goals:    Short Term Goal Identified by patient (Short Term=during hospitalization):  Dc home with daughter

## 2016-12-20 NOTE — Case Management (ED)
Case Management Progress Note    NAME:Javier Gutierrez                          MRN: [id number]              DOB:03-24-1940          AGE: 76 y.o.  ADMISSION DATE: 12/12/2016             DAYS ADMITTED: LOS: 8 days      Today???s Date: 12/20/2016    Plan  Anticipate d/c 12/5 to [name]'s home with [facility] and likely home enteral, pending pt stability.     Interventions  ? Support   Support: Pt/Family Updates re:POC or DC Plan   SW reviewed EMR and obtained updates from rehab team conference. SW met with pt and pt's dtr [name] to provide update on DCP, including anticipated d/c date of 12/5 with recommendations for consistent supervision, home health, home enteral, and tub bench and transport wheelchair. [name] agreeable to all recommendations and confirmed plan for pt to d/c to her home. [name] reports the family will work out a consistent schedule, but is unsure of what that will be right now. She reports her SIL is off work on Fridays, her brother Bill's hours are flexible right now, and she may use intermittent FMLA. [name] made several comments indicating that she fully understands the need for consistent supervision, for example noting that pt impulsively tried to take a drink of her water.   [name] agreeable to [facility], and may look at agencies that can also offer palliative options. [name] reports she works for a [employer] on the [place] side and is familiar with HH level of care. 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   [name] plans to visit [facility] to look at transfer wheelchairs and tub bench.   ? Info or Referral   Information or Referral to Community Resources: No Needs Identified  ? Discharge Planning   Discharge Planning: Home Health, Durable Medical Equipment and Supplies, Home Infusion-Enteral-TPN     SW sent referral to [facility] via Epic.    ? Medication Needs Medication Needs: No Needs Identified  ? Financial      ? Legal   Legal: No Needs Identified  ? Other   Other/None: No needs identified    Disposition  ? Expected Discharge Date    Expected Discharge Date: 12/26/16  ? Transportation   Does the patient need discharge transport arranged?: No  Transportation Name, Phone and Availability #1: Pt's dtr [name] [phone]  Transportation Name, Phone and Availability #2: [name] [phone]  Does the patient use Medicaid Transportation?: No  ? Next Level of Care (Acute Psych discharges only)      ? Discharge Disposition                                          Durable Medical Equipment     No service has been selected for the patient.      [facility]     No service has been selected for the patient.      [facility]     No service has been selected for the patient.  [facility]     No service has been selected for the patient.          Teodora Medici, LMSW  Phone: [phone]  Pager: [phone]

## 2016-12-20 NOTE — Progress Notes
Pt remained on telesitter this shift. Pt used the call light correctly one time this shift. Pt did try to get out of bed twice and was prompted by the telesitter. Pt continues to have poor safety awareness.

## 2016-12-20 NOTE — Progress Notes
SPEECH-LANGUAGE PATHOLOGY  REHAB SPEECH DAILY NOTE     SUMMARY: Pt seen for two 30 minute sessions to address dysphagia and cognition. Pt's daughter was present. Reviewed results of videoswallow and ongoing plan of care. Reviewed cognition and recommendations for d/c.    PLAN: Continue current therapy plan.     FREQUENCY TODAY: 2x     RECOMMENDATIONS  Remain NPO w/ use of PEG for nutrition/hydration/medications.  Frequent oral care (2-3x/daily)  Ice chips (10-15/hour), as tolerated. [facility]    ACTIVITIES FOR THE DAY:  Dysphagia:  Facilitated effortful swallow exercises with minimal (<1/4 tsp) nectar-thick bolus x100 across two sessions w/ mod verbal cues required. Pt frequently became distracted prior to initiating swallow, attempting to verbalize or tilting head upward, potentially resulting in premature loss of bolus. Verbal cues required in these instance to initiate swallow. During each session one delayed, weak cough response noted.    Cognition:  O-Log = 18/30. Pt required use of external aids for orientation to time and semantic cues for location.  Reviewed recommendations for consistent supervision 2/2 cognitive deficits w/ pt's daughter. She verbalized agreement and awareness of current cognitive status.    Speech Long Term Goals  Will improve skills necessary for ADI's and safety in the home with: Moderate assist  Patient will exhibit safe swallow for least restrictive consistency: Progressing    Speech Short Term Goals  Will demonstrate memory for safety sequences for transfer and ADLs with: 80% accuracy, Moderate cues  Will perform dysphagia exercises with: 80% accuracy, Moderate cues  Will participate in repeat instrumental swallow evaluation when clinically indicated: 100% accuracy, Moderate cues  Patient will tolerate therapeutic trials of nectar liquids: 80% accuracy, Minimal cues    Therapist: Lendell CapriceLauren , M.S., L/CCC-SLP  Date: 12/20/2016

## 2016-12-20 NOTE — Progress Notes
Heart Failure Nursing Progress Note    Admission Date: 12/12/2016  LOS: 8 days    Admission Weight: 60.5 kg (133 lb 6.1 oz)        Most recent weights (inpatient):   Vitals:    12/19/16 0558 12/19/16 0600 12/20/16 0600   Weight: 63.1 kg (139 lb 1.6 oz) 60.5 kg (133 lb 6.4 oz) 58.2 kg (128 lb 3.2 oz)     Weight change from previous day:-2.3kg    Fluid restriction ordered: no    Intake/Output Summary: (Last 24 hours)    Intake/Output Summary (Last 24 hours) at 12/20/16 1754  Last data filed at 12/20/16 1500   Gross per 24 hour   Intake             1720 ml   Output              350 ml   Net             1370 ml       Is patient incontinent No    Anticipated discharge date: TBD  Discharge goals: get stronger      Daily Assessment of Patient Stated Goals:    Short Term Goal Identified by patient (Short Term=during hospitalization):  Dc home with daughter

## 2016-12-20 NOTE — Progress Notes
Heart Failure Nursing Progress Note    Admission Date: 12/12/2016  LOS: 8 days    Admission Weight: 60.5 kg (133 lb 6.1 oz)        Most recent weights (inpatient):   Vitals:    12/19/16 0558 12/19/16 0600 12/20/16 0600   Weight: 63.1 kg (139 lb 1.6 oz) 60.5 kg (133 lb 6.4 oz) 58.2 kg (128 lb 3.2 oz)     Weight change from previous day: -2.3kg (pt weighed without sweatshirt and jeans per PCA)    Fluid restriction ordered: none    Intake/Output Summary: (Last 24 hours)    Intake/Output Summary (Last 24 hours) at 12/20/16 0645  Last data filed at 12/20/16 [id number]   Gross per 24 hour   Intake             2030 ml   Output              250 ml   Net             1780 ml       Is patient incontinent Yes.  Is brief scale being used?   No, because - need to be implemented 11/29    Anticipated discharge date: TBD  Discharge goals: Feel stronger      Daily Assessment of Patient Stated Goals:    Short Term Goal Identified by patient (Short Term=during hospitalization):  Walk with family

## 2016-12-21 LAB — CBC CELLULAR THERAPEUTICS: Lab: 9.4 K/UL (ref 4.5–11.0)

## 2016-12-21 LAB — BASIC METABOLIC PANEL CELLULAR THERAPEUTICS: Lab: 135 MMOL/L — ABNORMAL LOW (ref 137–147)

## 2016-12-21 NOTE — Care Coordination-Inpatient
Palliative Care Progress Note:      Javier Gutierrez is a 76 y.o. male with hypertension, hyperlipidemia, coronary artery disease, aortic stenosis, CKD, PAD and lifetime smoker, admitted with weakness and cough. Hospital course has been complicated by acute respiratory failure, sepsis, shock, peg placed on 11/15. Palliative care was asked to see pt/family for support and assist with plan of care.     Met with patient on 12/11/16 and decision made to go with plan of continuing rehab to try and regain as much strength as possible.  Patient's daughter also realizes that if an intervention would not be helpful for him then it would be important to figure out what could be done to help comfort and quality of life.  Transferred to inpatient rehab on 12/12/16.  Per EMR review it appears that patient is making gains in therapy.  Our team has not seen patient this week, but we continue to check the chart and are available if palliative care needs arise.  Please page [phone] if issues arise that we can help with.    Lillie Columbia APRN, [facility]  Palliative Care CNS  Pager:  [phone]  Office:  [phone]  Nights/Weekends - Page [phone] for Palliative Care On-Call  Note: Palliative care teams are now on Voalte

## 2016-12-21 NOTE — Progress Notes
Physical Medicine & Rehabilitation Progress Note       Today's Date:  12/21/2016  Admission Date: 12/12/2016  LOS: 9 days  Insurance:  Medicare    Principal Problem:    Debility  Active Problems:    Hypertension    Hyperlipidemia    CAD (coronary artery disease)    Weight loss    Aortic stenosis    Stage 3 chronic kidney disease (HCC)    PAD (peripheral artery disease) (HCC)    Chronic diastolic (congestive) heart failure (HCC)    S/P TAVR (transcatheter aortic valve replacement)    Localized edema    Parapneumonic effusion    Impaired mobility    Pulmonary embolism (HCC)    Necrotizing pneumonia (HCC)                       Assessment/Plan:       Javier Gutierrez is a 76 yo M with PMH of diastolic CHF, AS s/p TAVR (2017), CAD, CKD, HTN, who presented to the ED 10/30 w/ weakness and SOB. CT chest revealed left sided lung consolidation concerning for PNA w/ effusion + suspicious lung mass. Pulmonary was consulted and placed pigtail CT to suction, fluid c/w pneumonic effusion. Hospital course was complicated by acute respiratory failure w/ new o2 requirement, sepsis, shock requiring IV antibiotics, PEG tube due to dysphagia, hemoptysis requiring transfusion, and pulmonary embolism. He now presents to IRF with debility and impaired mobility and ADLs warranting PT/OT/SLP therapies.     Rehabilitation Plan  Rehabilitation: Patient will continue with comprehensive therapies including physical therapy, occupational therapy, speech & language pathology, specialized rehab nursing, neuropsychology and physiatry oversight.???   Goals: Mod I with least restrictive device (minA to CGA), supervision   Tentative discharge date: 12/26/16  Recommended therapy after discharge: [facility] PT/OT/SLP  Recommended equipment: RW, WC, tub transfer bench    Daily Functional Update:  Transfers       Device       Sit to Stand   Transfer: Assistive Device: None (12/20/2016  8:30 AM)  No Data Recorded   Gait       Device       Assist Required       Distance Gait: Assistive Device: Roller Walker (12/20/2016  8:30 AM)  No Data Recorded  Gait Distance: 300 feet (x2; 300x2) (12/20/2016  8:30 AM)   Standing Balance       Static       Dynamic   No Data Recorded  No Data Recorded   Toileting       Assist Required       Equipment       Toilet Transfer   Toileting Assist: Minimal Assist (12/18/2016  3:00 PM)  Toileting Equipment: Grab bar - right (12/16/2016 11:00 AM)  No Data Recorded   Dressing       Lower Body   LE Dressing Assist: Stand By Assist (12/20/2016  1:00 PM)       Debility secondary to complicated parapneumonic effusion, necrotizing pneumonia, septic shock, hemoptysis, pulmonary embolism  Cognitive impairment  Impaired mobility and ADLs  -admitted for acute respiratory failure due to PNA leading to septic shock   - s/p chest tube placement (pulled 11/11)  - s/p Zosyn for CAP with complicated lef parapneumonic pleural effusion  - s/p PEG for dysphagia on TF  - Completed course of Augmentin 11/27                 >  Palliative care following   > Internal Medicine and Pulmonary signed off, repeat CTA chest in 4 weeks; If PE unresolved at that time, would re-evaluate to determine appropriateness for anticoagulation. Will schedule follow up in pulmonary clinic.                  > PT/OT/SLP  ???  Pulmonary Embolism  Hemoptysis likely secondary to necrotizing hemorrhagic pneumonia in left lung, improving  Acute Blood Loss Anemia  -  First noted11/17/18, anemic, s/p 2 U pRBCs  - ???CT and CTA chest 12/10/2016 : PE was noted in superior RLL  - Completed course of Augmentin 11/27                 > Continue singulair 10mg  QD, albuterol inaler, robitussin                  > Continue PPI 40mg  BID, transitioned to via PEG on 11/22                 >  Palliative care following                  > Continue to hold anticoagulation as patient for resolving hemoptysis, now improved; paged pulm x2 regarding DVT ppx   > Repeat CTA chest in 4 weeks per pulm  ???   Dysphagia s/p PEG tube - placement on 11/15  - NPO   > VS on 11/27, continue NPO and TF. Will trial nectar thick during therapies                 > Continue TF Isosource 1.5 bolus, 5 cartons/day.                  > Water restriction Free water Q6H   > Consult SLP and dietitian     Hyponatremia  SIADH   > CTM   > Water restriction free water Q6H per PEG   > Needs follow up CT chest in 4-6 weeks as underlying malignancy cannot be ruled out; CT spine 10/30 showed possible lytic lesions                   CAD/PAD  Chronic Diastolic HF  HTN  HLD  - Holding simvasastin and fenofibrate due to muscle weakness 11/1                 > Continue PTA co-reg (dose reduced from home 25 to 12.5mg  BID)  ???  BPH, nocturia: continue doxazosin (changed to QHS dosing for nocturia)  ???  Hypothyroidism: continue PTA synthroid  ???  Hx of falls  Hx of cerebral infarction  - CT head negative, C spine possible lytic lesion   > Follow up CT chest in 4-6 weeks  ???  Pain Management: pain is well controlled and Tylenol PRN  Skin: Stage 1 lower (present on admission)  Bowel: continent of bowel  Bladder: continent of bladder, BVIs x3 until <163ml and ISC for >32ml  Nutrition: Current diet:  NPO  Feeding tube: [name] Kitchen  Mental Health: consult neuropsychology to provide support / counseling  DVT Prophylaxis: held due to risk of bleeding, will touch base with pulmonary today     Sande Brothers, D.O.,M.B.A.  Physical Medicine and Rehabilitation, PGY-2  Pager [phone]    Subjective     Javier Gutierrez is a 76 y.o. seen in his room. He has no complaints. Denies CP. Slept well. Gives thumbs up. No changes  today. Paged pulmonary regarding DVT ppx. Awaiting f/u appointment with pulmonary.     Objective                        Vital Signs: Last Filed                 Vital Signs: 24 Hour Range   BP: 122/56 (11/30 0906)  Temp: 36.6 ???C (97.9 ???F) (11/30 0510)  Pulse: 70 (11/30 0906)  Respirations: 17 PER MINUTE (11/30 0510)  SpO2: 93 % (11/30 0510) O2 Delivery: None (Room Air) (11/30 0510) BP: (122-154)/(56-66)   Temp:  [36.6 ???C (97.8 ???F)-36.6 ???C (97.9 ???F)]   Pulse:  [69-73]   Respirations:  [17 PER MINUTE-18 PER MINUTE]   SpO2:  [93 %-94 %]   O2 Delivery: None (Room Air)     Vitals:    12/19/16 0600 12/20/16 0600 12/21/16 0600   Weight: 60.5 kg (133 lb 6.4 oz) 58.2 kg (128 lb 3.2 oz) 57.2 kg (126 lb)       Intake/Output Summary:  (Last 24 hours)    Intake/Output Summary (Last 24 hours) at 12/21/16 1002  Last data filed at 12/21/16 0600   Gross per 24 hour   Intake             1130 ml   Output              500 ml   Net              630 ml      Stool Occurrence: 1       Oral Diet Order: NPO  Last Bowel Movement Date: 12/21/16             Lab:  Results for orders placed or performed during the hospital encounter of 12/12/16 (from the past 24 hour(s))   CBC CELLULAR THERAPEUTICS    Collection Time: 12/21/16  7:29 AM   # # Low-High    White Blood Cells 9.4 4.5 - 11.0 K/UL    RBC 3.35 (L) 4.4 - 5.5 M/UL    Hemoglobin 10.3 (L) 13.5 - 16.5 GM/DL    Hematocrit 16.1 (L) 40 - 50 %    MCV 94.8 80 - 100 FL    MCH 30.7 26 - 34 PG    MCHC 32.4 32.0 - 36.0 G/DL    RDW 09.6 (H) 11 - 15 %    Platelet Count 317 150 - 400 K/UL    MPV 10.1 7 - 11 FL   BASIC METABOLIC PANEL CELLULAR THERAPEUTICS    Collection Time: 12/21/16  7:29 AM   # # Low-High    Sodium 135 (L) 137 - 147 MMOL/L    Potassium 4.0 3.5 - 5.1 MMOL/L    Chloride 102 98 - 110 MMOL/L    CO2 28 21 - 30 MMOL/L    Anion Gap 5 3 - 12    Glucose 111 (H) 70 - 100 MG/DL    Blood Urea Nitrogen 25 7 - 25 MG/DL    Creatinine 0.45 0.4 - 1.24 MG/DL    Calcium 8.6 8.5 - 40.9 MG/DL    eGFR Non African American >60 >60 mL/min    eGFR African American >60 >60 mL/min       Physical Exam  VS: BP 122/56  - Pulse 70  - Temp 36.6 ???C (97.9 ???F)  - Ht 177.8 cm (70)  - Wt 57.2 kg (126 lb)  -  SpO2 93%  - BMI 18.08 kg/m???     Gen: Alert & Conversant, No Acute Distress  HEENT: Normocephalic, atraumatic, sclera anicteric, conjunctiva not injected  Neck: Supple, no elevated JVP  Heart: Regular Rate & Rhythm, No Murmur  Lungs: Good inspiratory effort , CTAB  Abdomen: Soft, non-tender, non-distended, Peg tube site c/d/i  Skin: Warm, dry   Ext: No edema or erythema noted in b/l lower limbs  MS: Generalized weakness stable, 4/5 hip flexion bilaterally    Therapy Notes & Labs Reviewed.      Sande Brothers, D.O.,M.B.A.  Physical Medicine and Rehabilitation, PGY-2  Pager [phone]

## 2016-12-21 NOTE — Progress Notes
SPEECH-LANGUAGE PATHOLOGY  REHAB SPEECH DAILY NOTE     SUMMARY: Pt seen for two 30 minute sessions to address dysphagia and cognition.   ???  PLAN: Continue current therapy plan. Consider repeat instrumental evaluation of swallowing prior to d/c.  ???  FREQUENCY TODAY: 2x   ???  RECOMMENDATIONS  Remain NPO w/ use of PEG for nutrition/hydration/medications.  Frequent oral care (2-3x/daily)  Ice chips (10-15/hour), as tolerated. Details at [facility]    ACTIVITIES FOR THE DAY:  Dysphagia:  Facilitated effortful swallow exercises with spoon dipped in nectar-thick liquid x100 across two sessions w/ min-mod verbal cues for attention and safety required. After second set of ten during session 1, prompted pt for volitional cough w/ resulting expectoration of mild/mod amount of blood in phlegm. Alerted pt's RN who is aware. Throughout remainder of trials during first session, mildly wet respirations noted intermittently w/ pt exhibiting poor awareness. Pt occassionally began lightly coughing but denied the need to continue coughing to clear sensation. Cough noted x1 prior to swallow when pt appeared to become distracted. During second session, no overt s/sx aspiration noted though pt still distractible.    Cognition:  O-Log = 19/30. Pt required use of external aids for orientation to time. No carryover/recall of reason for hospitalization, current deficits.     Speech Long Term Goals  Will improve skills necessary for ADI's and safety in the home with: Moderate assist  Patient will exhibit safe swallow for least restrictive consistency: Progressing  ???  Speech Short Term Goals  Will demonstrate memory for safety sequences for transfer and ADLs with: 80% accuracy, Moderate cues  Will perform dysphagia exercises with: 80% accuracy, Moderate cues  Will participate in repeat instrumental swallow evaluation when clinically indicated: 100% accuracy, Moderate cues  Patient will tolerate therapeutic trials of nectar liquids: 80% accuracy, Minimal cues    Therapist: Lendell Caprice, M.S., L/CCC-SLP  Date: 12/21/2016

## 2016-12-21 NOTE — Progress Notes
Heart Failure Nursing Progress Note    Admission Date: 12/12/2016  LOS: 9 days    Admission Weight: 60.5 kg (133 lb 6.1 oz)        Most recent weights (inpatient):   Vitals:    12/19/16 0600 12/20/16 0600 12/21/16 0600   Weight: 60.5 kg (133 lb 6.4 oz) 58.2 kg (128 lb 3.2 oz) 57.2 kg (126 lb)     Weight change from previous day:-1kg    Fluid restriction ordered: no    Intake/Output Summary: (Last 24 hours)    Intake/Output Summary (Last 24 hours) at 12/21/16 1745  Last data filed at 12/21/16 1740   Gross per 24 hour   Intake             1665 ml   Output              400 ml   Net             1265 ml       Is patient incontinent No    Anticipated discharge date: 12/5  Discharge goals: get stronger      Daily Assessment of Patient Stated Goals:    Short Term Goal Identified by patient (Short Term=during hospitalization):  Discharge home with daughter

## 2016-12-21 NOTE — Progress Notes
Pt reminds on telesitter during this shift. Pt didn't use the call light appropriately, shouting help a few times instead. Pt tried to get out of bed once and was prompted by the telesitter. Pt continues to display impulsiveness and poor safety awareness.

## 2016-12-21 NOTE — Progress Notes
Heart Failure Nursing Progress Note    Admission Date: 12/12/2016  LOS: 9 days    Admission Weight: 60.5 kg (133 lb 6.1 oz)        Most recent weights (inpatient):   Vitals:    12/19/16 0600 12/20/16 0600 12/21/16 0600   Weight: 60.5 kg (133 lb 6.4 oz) 58.2 kg (128 lb 3.2 oz) 57.2 kg (126 lb)     Weight change from previous day: -1 kg    Fluid restriction ordered: No  Intake/Output Summary: (Last 24 hours)    Intake/Output Summary (Last 24 hours) at 12/21/16 [id number]  Last data filed at 12/21/16 0600   Gross per 24 hour   Intake             1230 ml   Output              500 ml   Net              730 ml       Is patient incontinent No    Anticipated discharge date: TBD  Discharge goals: Get stronger      Daily Assessment of Patient Stated Goals:    Short Term Goal Identified by patient (Short Term=during hospitalization):  DC to home with daughter

## 2016-12-21 NOTE — Progress Notes
CLINICAL NUTRITION                                                        Clinical Nutrition Follow-Up Summary     NAME:Javier Gutierrez             MRN: [id number]             DOB:[birth year]          AGE: 76 y.o.  ADMISSION DATE: 12/12/2016             DAYS ADMITTED: LOS: 9 days    Nutrition Assessment of Patient:  Malnutrition Assessment: Malnutrition present  Current Oral Intake: NPO  Estimated Calorie Needs: 1700-1840 (25-27kcal/kg of acute admit wt 68kg)  Estimated Protein Needs: 82-88 (1.2-1.3g/kg of acute admit wt 68kg)  Oral Diet Order: NPO   9-day EN Avg:  Intake (calories) Daily Average : 1550 kilocalories (83%)  Intake (protein) Daily Average : 70 grams (82%)  Current EN Order: Isosource 1.5, 1 carton ( ) x 5 per day + water bolus Q6H. Provides 1875kcal, 85g protein, and free water.      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 PMH of HTN, HLD, CAD, aortic stenosis, CKD, PAD, and lifetime smoker.  Bolus feeds started 11/23 and continued to be tolerated well; RN confirms. Regimen continues to be 5x/day. Per RN, pt is ready for condensed feeds per RD recs given earlier this week. Noted ~7# wt decrease from Rehab admit with + fluid status. Noted all bolus feeds were not given yesterday, 11/29. Per rehab feasibility, condensed regimen is ideal. Overall, majority of needs are being met (shown above). Pt likely to dc on EN. Attempted to provide PEG education today, but no family was present. DC pending 12/5. Hyponatremia improved (today Na+ 135); continues on fluid restriction providing total free water with EN, flushes, bolus. On bowel regimen. Last BM 11/30. Stage I to coccyx remains. No other concerns.     Recommendation:  ??? Condense bolus feeds to Isosource 1.5 of (1.5 cartons) TID + (1/2 carton) as HS snack, for a total of 5 cartons daily.   ??? Add minimum of 30ml water flush pre/post bolus.   ??? Provides 1875kcal, 85g protein, and free water.   ??? Additional fluids per team or QID to meet baseline fluid goals of 1 ml/kcal 2/2 improved hyponatremia. Total fluids = free water.                                    Intervention / Plan:  f/u PEG education  Monitor EN rate/tolerance, wt, labs, skin, gi, meds, fluids       Nutrition Diagnosis:  Inadequate energy intake  Etiology: prior decreased appetite, dysphagia, inadequate TF.  Signs & Symptoms: documentation of I/Os, continued NPO with EN  Altered GI function  Etiology: dsyphagia  Signs & Symptoms: NPO with continued EN  to meet 100% of needs             Goals:  EN tolerated and meeting >85% of nutritional needs  Time Frame: Within 5 Days  Status: Met;Ongoing                Dorna Mai, RD, LD *[phone]

## 2016-12-21 NOTE — Progress Notes
Pt remains on telesitter at this time. Telesitter had to alert RN of pt getting out of bed 3 times during shift. Pt does not remember to use call light before trying to get out of bed. Pt is confused and impulsive with poor safety awareness.

## 2016-12-22 NOTE — Progress Notes
RT Adult Assessment Note    NAME:Delvin Cicero DuckG Javid             MRN: [id number]             DOB:06/25/1940          AGE: 76 y.o.  ADMISSION DATE: 12/12/2016             DAYS ADMITTED: LOS: 10 days    RT Treatment Plan:  Protocol Plan: Medications  Albuterol: MDI PRN    Protocol Plan: Procedures  PAP: Place a nursing order for "IS Q1h While Awake" for any of Lung Expansion indicators    Additional Comments:  Impressions of the patient: patient is alert and oriented   Intervention(s)/outcome(s): n/a  Patient education that was completed: n/a  Recommendations to the care team: n/a    Vital Signs:  Pulse: Pulse: 74  RR: Respirations: 18 PER MINUTE  SpO2: SpO2: 94 %  O2 Device:    Liter Flow:    O2%: O2 Percent: 21 %  Breath Sounds:    Respiratory Effort: Respiratory Effort: Non-Labored

## 2016-12-22 NOTE — Progress Notes
Heart Failure Nursing Progress Note    Admission Date: 12/12/2016  LOS: 10 days    Admission Weight: 60.5 kg (133 lb 6.1 oz)        Most recent weights (inpatient):   Vitals:    12/20/16 0600 12/21/16 0600 12/22/16 0700   Weight: 58.2 kg (128 lb 3.2 oz) 57.2 kg (126 lb) 55.9 kg (123 lb 3.2 oz)     Weight change from previous day:-2.2 kg    Fluid restriction ordered: no    Intake/Output Summary: (Last 24 hours)    Intake/Output Summary (Last 24 hours) at 12/22/16 0737  Last data filed at 12/22/16 [id number]   Gross per 24 hour   Intake             2050 ml   Output              270 ml   Net             1780 ml       Is patient incontinent Yes.  Is brief scale being used?   Yes    Anticipated discharge date: 12-26-16  Discharge goals: ambulate safely      Daily Assessment of Patient Stated Goals:    Short Term Goal Identified by patient (Short Term=during hospitalization):  Remain continent

## 2016-12-22 NOTE — Progress Notes
Tele-sitter remains in pt room throughout the night. Pt called appropriately and did not attempt to exit the bed without calling.

## 2016-12-22 NOTE — Progress Notes
Heart Failure Nursing Progress Note    Admission Date: 12/12/2016  LOS: 10 days    Admission Weight: 60.5 kg (133 lb 6.1 oz)        Most recent weights (inpatient):   Vitals:    12/20/16 0600 12/21/16 0600 12/22/16 0700   Weight: 58.2 kg (128 lb 3.2 oz) 57.2 kg (126 lb) 55.9 kg (123 lb 3.2 oz)     Weight change from previous day:-1.3kg    Fluid restriction ordered: No    Intake/Output Summary: (Last 24 hours)    Intake/Output Summary (Last 24 hours) at 12/22/16 1647  Last data filed at 12/22/16 1453   Gross per 24 hour   Intake             2810 ml   Output              270 ml   Net             2540 ml       Is patient incontinent No    Anticipated discharge date: 12/26/16  Discharge goals: ambulate safely      Daily Assessment of Patient Stated Goals:    Short Term Goal Identified by patient (Short Term=during hospitalization):

## 2016-12-22 NOTE — Progress Notes
Physical Medicine & Rehabilitation Progress Note       Today's Date:  12/22/2016  Admission Date: 12/12/2016  LOS: 10 days  Insurance:  Medicare    Principal Problem:    Debility  Active Problems:    Hypertension    Hyperlipidemia    CAD (coronary artery disease)    Weight loss    Aortic stenosis    Stage 3 chronic kidney disease (HCC)    PAD (peripheral artery disease) (HCC)    Chronic diastolic (congestive) heart failure (HCC)    S/P TAVR (transcatheter aortic valve replacement)    Localized edema    Parapneumonic effusion    Impaired mobility    Pulmonary embolism (HCC)    Necrotizing pneumonia (HCC)                       Assessment/Plan:       Javier Gutierrez is a 76 yo M with PMH of diastolic CHF, AS s/p TAVR (2017), CAD, CKD, HTN, who presented to the ED 10/30 w/ weakness and SOB. CT chest revealed left sided lung consolidation concerning for PNA w/ effusion + suspicious lung mass. Pulmonary was consulted and placed pigtail CT to suction, fluid c/w pneumonic effusion. Hospital course was complicated by acute respiratory failure w/ new o2 requirement, sepsis, shock requiring IV antibiotics, PEG tube due to dysphagia, hemoptysis requiring transfusion, and pulmonary embolism. He now presents to IRF with debility and impaired mobility and ADLs warranting PT/OT/SLP therapies.     Rehabilitation Plan  Rehabilitation: Patient will continue with comprehensive therapies including physical therapy, occupational therapy, speech & language pathology, specialized rehab nursing, neuropsychology and physiatry oversight.???   Goals: Mod I with least restrictive device (minA to CGA), supervision   Tentative discharge date: 12/26/16  Recommended therapy after discharge: [facility] PT/OT/SLP  Recommended equipment: RW, WC, tub transfer bench    Daily Functional Update:  Transfers       Device       Sit to Stand   Transfer: Assistive Device: [employer] (12/21/2016  9:30 AM)  No Data Recorded   Gait       Device       Assist Required Distance   Gait: Assistive Device: Roller Walker;None (12/21/2016  9:30 AM)  No Data Recorded  Gait Distance: 600 feet (150; 300, 150) (12/21/2016  9:30 AM)   Standing Balance       Static       Dynamic   No Data Recorded  No Data Recorded   Toileting       Assist Required       Equipment       Toilet Transfer   Toileting Assist: Stand By Assist (12/21/2016  3:00 PM)  Toileting Equipment: Grab bar - right (12/16/2016 11:00 AM)  No Data Recorded   Dressing       Lower Body   LE Dressing Assist: Stand By Assist (12/20/2016  1:00 PM)       Debility secondary to complicated parapneumonic effusion, necrotizing pneumonia, septic shock, hemoptysis, pulmonary embolism  Cognitive impairment  Impaired mobility and ADLs  -admitted for acute respiratory failure due to PNA leading to septic shock   - s/p chest tube placement (pulled 11/11)  - s/p Zosyn for CAP with complicated lef parapneumonic pleural effusion  - s/p PEG for dysphagia on TF  - Completed course of Augmentin 11/27                 >  Palliative care following   > Internal Medicine and Pulmonary signed off, repeat CTA chest in 4 weeks; If PE unresolved at that time, would re-evaluate to determine appropriateness for anticoagulation. Will schedule follow up in pulmonary clinic.                  > PT/OT/SLP  ???  Pulmonary Embolism  Hemoptysis likely secondary to necrotizing hemorrhagic pneumonia in left lung, improving  Acute Blood Loss Anemia  -  First noted11/17/18, anemic, s/p 2 U pRBCs  - ???CT and CTA chest 12/10/2016 : PE was noted in superior RLL  - Completed course of Augmentin 11/27                 > Continue singulair 10mg  QD, albuterol inaler, robitussin                  > Continue PPI 40mg  BID, transitioned to via PEG on 11/22                 >  Palliative care following                  > Continue to hold anticoagulation as patient for resolving hemoptysis, now improved; paged pulm x2 regarding DVT ppx   > Repeat CTA chest in 4 weeks per pulm  ??? Dysphagia s/p PEG tube  - placement on 11/15  - NPO   > VS on 11/27, continue NPO and TF. Will trial nectar thick during therapies                 > Continue TF Isosource 1.5 bolus, 5 cartons/day.                  > Water restriction Free water Q6H   > Consult SLP and dietitian     Hyponatremia  SIADH   > CTM   > Water restriction free water Q6H per PEG   > Needs follow up CT chest in 4-6 weeks as underlying malignancy cannot be ruled out; CT spine 10/30 showed possible lytic lesions                   CAD/PAD  Chronic Diastolic HF  HTN  HLD  - Holding simvasastin and fenofibrate due to muscle weakness 11/1                 > Continue PTA co-reg (dose reduced from home 25 to 12.5mg  BID)  ???  BPH, nocturia: continue doxazosin (changed to QHS dosing for nocturia)  ???  Hypothyroidism: continue PTA synthroid  ???  Hx of falls  Hx of cerebral infarction  - CT head negative, C spine possible lytic lesion   > Follow up CT chest in 4-6 weeks  ???  Pain Management: pain is well controlled and Tylenol PRN  Skin: Stage 1 lower (present on admission)  Bowel: continent of bowel  Bladder: continent of bladder, BVIs x3 until <156ml and ISC for >366ml  Nutrition: Current diet:  NPO  Feeding tube: [name] Kitchen  Mental Health: consult neuropsychology to provide support / counseling  DVT Prophylaxis: held due to risk of bleeding, will touch base with pulmonary today         Subjective     Javier Gutierrez is a 76 y.o. seen in his room sitting at bedside. He slept well, denies pain, N&V, CP, SOB.     Objective  Vital Signs: Last Filed                 Vital Signs: 24 Hour Range   BP: 135/62 (12/01 0435)  Temp: 36.7 ???C (98.1 ???F) (12/01 0435)  Pulse: 68 (12/01 0435)  Respirations: 18 PER MINUTE (12/01 0435)  SpO2: 96 % (12/01 0435)  O2 Delivery: None (Room Air) (12/01 0435) BP: (122-140)/(56-62)   Temp:  [36.7 ???C (98.1 ???F)-36.8 ???C (98.3 ???F)]   Pulse:  [68-76]   Respirations:  [18 PER MINUTE]   SpO2:  [95 %-96 %] O2 Delivery: None (Room Air)     Vitals:    12/20/16 0600 12/21/16 0600 12/22/16 0700   Weight: 58.2 kg (128 lb 3.2 oz) 57.2 kg (126 lb) 55.9 kg (123 lb 3.2 oz)       Intake/Output Summary:  (Last 24 hours)    Intake/Output Summary (Last 24 hours) at 12/22/16 1610  Last data filed at 12/22/16 9604   Gross per 24 hour   Intake             1950 ml   Output              270 ml   Net             1680 ml      Stool Occurrence: (P) 1       Oral Diet Order: NPO  Last Bowel Movement Date: (P) 12/22/16             Lab:  No results found for this visit on 12/12/16 (from the past 24 hour(s)).    Physical Exam  VS: BP 135/62 (BP Source: Arm, Right Upper)  - Pulse 68  - Temp 36.7 ???C (98.1 ???F)  - Ht 177.8 cm (70)  - Wt 55.9 kg (123 lb 3.2 oz)  - SpO2 96%  - BMI 17.68 kg/m???     Gen: Alert & Conversant, No Acute Distress  HEENT: Normocephalic, atraumatic, sclera anicteric, conjunctiva not injected  Neck: Supple, no elevated JVP  Heart: Regular Rate & Rhythm, No Murmur  Lungs: Good inspiratory effort , CTAB  Abdomen: Soft, non-tender, non-distended, Peg tube site c/d/i  Skin: Warm, dry       Therapy Notes & Labs Reviewed.

## 2016-12-23 NOTE — Progress Notes
Heart Failure Nursing Progress Note    Admission Date: 12/12/2016  LOS: 11 days    Admission Weight: 60.5 kg (133 lb 6.1 oz)        Most recent weights (inpatient):   Vitals:    12/21/16 0600 12/22/16 0700 12/23/16 0627   Weight: 57.2 kg (126 lb) 55.9 kg (123 lb 3.2 oz) 59.4 kg (131 lb)     Weight change from previous day: +3.5    Fluid restriction ordered: none    Intake/Output Summary: (Last 24 hours)    Intake/Output Summary (Last 24 hours) at 12/23/16 0658  Last data filed at 12/23/16 [id number]   Gross per 24 hour   Intake             2685 ml   Output              470 ml   Net             2215 ml       Is patient incontinent Yes.  Is brief scale being used?   Yes    Anticipated discharge date: 12-26-16  Discharge goals: ambulate safely.      Daily Assessment of Patient Stated Goals:    Short Term Goal Identified by patient (Short Term=during hospitalization):  Time void, remain continent

## 2016-12-23 NOTE — Progress Notes
Pt remained on TS throughout this RN shift. Pt called appropriately and did not try to get out of bed without calling. Family at bedside throughout shift.

## 2016-12-23 NOTE — Progress Notes
Physical Medicine & Rehabilitation Progress Note       Today's Date:  12/23/2016  Admission Date: 12/12/2016  LOS: 11 days  Insurance:  Medicare    Principal Problem:    Debility  Active Problems:    Hypertension    Hyperlipidemia    CAD (coronary artery disease)    Weight loss    Aortic stenosis    Stage 3 chronic kidney disease (HCC)    PAD (peripheral artery disease) (HCC)    Chronic diastolic (congestive) heart failure (HCC)    S/P TAVR (transcatheter aortic valve replacement)    Localized edema    Parapneumonic effusion    Impaired mobility    Pulmonary embolism (HCC)    Necrotizing pneumonia (HCC)                       Assessment/Plan:       Javier Gutierrez is a 76 yo M with PMH of diastolic CHF, AS s/p TAVR (2017), CAD, CKD, HTN, who presented to the ED 10/30 w/ weakness and SOB. CT chest revealed left sided lung consolidation concerning for PNA w/ effusion + suspicious lung mass. Pulmonary was consulted and placed pigtail CT to suction, fluid c/w pneumonic effusion. Hospital course was complicated by acute respiratory failure w/ new o2 requirement, sepsis, shock requiring IV antibiotics, PEG tube due to dysphagia, hemoptysis requiring transfusion, and pulmonary embolism. He now presents to IRF with debility and impaired mobility and ADLs warranting PT/OT/SLP therapies.     Rehabilitation Plan  Rehabilitation: Patient will continue with comprehensive therapies including physical therapy, occupational therapy, speech & language pathology, specialized rehab nursing, neuropsychology and physiatry oversight.???   Goals: Mod I with least restrictive device (minA to CGA), supervision   Tentative discharge date: 12/26/16  Recommended therapy after discharge: [facility] PT/OT/SLP  Recommended equipment: RW, WC, tub transfer bench    Daily Functional Update:  Transfers       Device       Sit to Stand   Transfer: Assistive Device: [employer] (12/21/2016  9:30 AM)  No Data Recorded   Gait       Device       Assist Required Distance   Gait: Assistive Device: Roller Walker;None (12/21/2016  9:30 AM)  No Data Recorded  Gait Distance: 600 feet (150; 300, 150) (12/21/2016  9:30 AM)   Standing Balance       Static       Dynamic   No Data Recorded  No Data Recorded   Toileting       Assist Required       Equipment       Toilet Transfer   Toileting Assist: Stand By Assist (12/21/2016  3:00 PM)  Toileting Equipment: Grab bar - right (12/16/2016 11:00 AM)  No Data Recorded   Dressing       Lower Body   LE Dressing Assist: Stand By Assist (12/20/2016  1:00 PM)       Debility secondary to complicated parapneumonic effusion, necrotizing pneumonia, septic shock, hemoptysis, pulmonary embolism  Cognitive impairment  Impaired mobility and ADLs  -admitted for acute respiratory failure due to PNA leading to septic shock   - s/p chest tube placement (pulled 11/11)  - s/p Zosyn for CAP with complicated lef parapneumonic pleural effusion  - s/p PEG for dysphagia on TF  - Completed course of Augmentin 11/27                 >  Palliative care following   > Internal Medicine and Pulmonary signed off, repeat CTA chest in 4 weeks; If PE unresolved at that time, would re-evaluate to determine appropriateness for anticoagulation. Will schedule follow up in pulmonary clinic.                  > PT/OT/SLP  ???  Pulmonary Embolism  Hemoptysis likely secondary to necrotizing hemorrhagic pneumonia in left lung, improving  Acute Blood Loss Anemia  -  First noted11/17/18, anemic, s/p 2 U pRBCs  - ???CT and CTA chest 12/10/2016 : PE was noted in superior RLL  - Completed course of Augmentin 11/27                 > Continue singulair 10mg  QD, albuterol inaler, robitussin                  > Continue PPI 40mg  BID, transitioned to via PEG on 11/22                 >  Palliative care following                  > Continue to hold anticoagulation as patient for resolving hemoptysis, now improved; paged pulm x2 regarding DVT ppx   > Repeat CTA chest in 4 weeks per pulm  ??? Dysphagia s/p PEG tube  - placement on 11/15  - NPO   > VS on 11/27, continue NPO and TF. Will trial nectar thick during therapies                 > Continue TF Isosource 1.5 bolus, 5 cartons/day.                  > Water restriction Free water Q6H   > Consult SLP and dietitian     Hyponatremia  SIADH   > CTM   > Water restriction free water Q6H per PEG   > Needs follow up CT chest in 4-6 weeks as underlying malignancy cannot be ruled out; CT spine 10/30 showed possible lytic lesions                   CAD/PAD  Chronic Diastolic HF  HTN  HLD  - Holding simvasastin and fenofibrate due to muscle weakness 11/1                 > Continue PTA co-reg (dose reduced from home 25 to 12.5mg  BID)  ???  BPH, nocturia: continue doxazosin (changed to QHS dosing for nocturia)  ???  Hypothyroidism: continue PTA synthroid  ???  Hx of falls  Hx of cerebral infarction  - CT head negative, C spine possible lytic lesion   > Follow up CT chest in 4-6 weeks  ???  Pain Management: pain is well controlled and Tylenol PRN  Skin: Stage 1 lower (present on admission)  Bowel: continent of bowel  Bladder: continent of bladder, BVIs x3 until <145ml and ISC for >362ml  Nutrition: Current diet:  NPO  Feeding tube: [name] Kitchen  Mental Health: consult neuropsychology to provide support / counseling  DVT Prophylaxis: held due to risk of bleeding, will touch base with pulmonary today         Subjective     Javier Gutierrez is a 76 y.o. seen in his room sitting at bedside chair. He feels great this AM. He walked a lap with therapies. He denies pain, N&V, CP, SOB.  Objective                        Vital Signs: Last Filed                 Vital Signs: 24 Hour Range   BP: 142/58 (12/02 0823)  Temp: 36.6 ???C (97.9 ???F) (12/02 0446)  Pulse: 69 (12/02 0823)  Respirations: 18 PER MINUTE (12/02 0446)  SpO2: 98 % (12/02 0446)  O2 Delivery: None (Room Air) (12/02 0446) BP: (118-145)/(51-62)   Temp:  [36.6 ???C (97.9 ???F)]   Pulse:  [67-88]   Respirations:  [18 PER MINUTE] SpO2:  [98 %]   O2 Delivery: None (Room Air)     Vitals:    12/21/16 0600 12/22/16 0700 12/23/16 1610   Weight: 57.2 kg (126 lb) 55.9 kg (123 lb 3.2 oz) 59.4 kg (131 lb)       Intake/Output Summary:  (Last 24 hours)    Intake/Output Summary (Last 24 hours) at 12/23/16 1039  Last data filed at 12/23/16 0825   Gross per 24 hour   Intake             1815 ml   Output              470 ml   Net             1345 ml      Stool Occurrence: 1       Oral Diet Order: NPO  Last Bowel Movement Date: 12/22/16             Lab:  No results found for this visit on 12/12/16 (from the past 24 hour(s)).    Physical Exam  VS: BP 142/58  - Pulse 69  - Temp 36.6 ???C (97.9 ???F)  - Ht 177.8 cm (70)  - Wt 59.4 kg (131 lb)  - SpO2 98%  - BMI 18.80 kg/m???     Gen: Alert & Conversant, No Acute Distress  HEENT: Normocephalic, atraumatic, sclera anicteric, conjunctiva not injected  Neck: Supple, no elevated JVP  Heart: Regular Rate & Rhythm, No Murmur  Lungs: Good inspiratory effort , CTAB  Abdomen: Soft, non-tender, non-distended, Peg tube site c/d/i  Psych: Pleasant mood       Therapy Notes & Labs Reviewed.

## 2016-12-23 NOTE — Progress Notes
Tele-sitter remains in patients room throughout, patient did not display any impulsive behavior. Tele-sitter did verbally cue patient to use call light instead of yelling for help.

## 2016-12-23 NOTE — Progress Notes
Pt remained on TS throughout this RN shift. Pt called appropriately. Pt did not attempt to get out of bed without calling. Family at bedside throughout day.

## 2016-12-23 NOTE — Progress Notes
Heart Failure Nursing Progress Note    Admission Date: 12/12/2016  LOS: 11 days    Admission Weight: 60.5 kg (133 lb 6.1 oz)        Most recent weights (inpatient):   Vitals:    12/21/16 0600 12/22/16 0700 12/23/16 [id number]   Weight: 57.2 kg (126 lb) 55.9 kg (123 lb 3.2 oz) 59.4 kg (131 lb)     Weight change from previous day: +3.5kg    Fluid restriction ordered: No    Intake/Output Summary: (Last 24 hours)    Intake/Output Summary (Last 24 hours) at 12/23/16 1627  Last data filed at 12/23/16 1500   Gross per 24 hour   Intake             2800 ml   Output              470 ml   Net             2330 ml       Is patient incontinent Yes    Anticipated discharge date: 12/26/16  Discharge goals: ambulate safely      Daily Assessment of Patient Stated Goals:    Short Term Goal Identified by patient (Short Term=during hospitalization):

## 2016-12-24 ENCOUNTER — Encounter: Admit: 2016-12-24 | Discharge: 2016-12-24 | Payer: MEDICARE

## 2016-12-24 DIAGNOSIS — R9389 Abnormal findings on diagnostic imaging of other specified body structures: Principal | ICD-10-CM

## 2016-12-24 LAB — BASIC METABOLIC PANEL CELLULAR THERAPEUTICS: Lab: 134 MMOL/L — ABNORMAL LOW (ref >60)

## 2016-12-24 LAB — CULTURE-FUNGAL,OTHER

## 2016-12-24 LAB — CBC CELLULAR THERAPEUTICS: Lab: 6.9 K/UL — ABNORMAL LOW (ref >60)

## 2016-12-24 NOTE — Case Management (ED)
Case Management Progress Note    NAME:Javier Gutierrez                          MRN: [id number]              DOB:1940/06/21          AGE: 76 y.o.  ADMISSION DATE: 12/12/2016             DAYS ADMITTED: LOS: 12 days      Today???s Date: 12/24/2016    Plan  Anticipate d/c 12/5 to home with daughter vs SNF, pending pt stability and coordination of DCP.     Interventions  ? Support   Support: Pt/Family Updates re:POC or DC Plan   SW contacted by [name] with questions regarding SNF placement. [name] reports that she feels pt had a good weekend progress wise and doesn't want to backtrack on progress. [name] inquired about [facility] in [place] and SW agreeable to sending referral. SW did advise that SNF is not recommended and explained that per therapy staff, pt would benefit from a familiar environment. [name] understanding and explained that d/c to her home would remain the plan, even after SNF.   SW inquired about DCP, if pt does not go to SNF. [name] reports they prefer [facility] and remain agreeable to [facility].   ? Info or Referral   Information or Referral to Community Resources: No Needs Identified  ? Discharge Planning   Discharge Planning: Home Health, Durable Medical Equipment and Supplies, Home Infusion-Enteral-TPN   SW sent referral to [facility] via Epic.   SW spoke with Amy with [facility], who reports pt will be covered 80% by Medicare, with out of pocket cost of about $60/month.   ? Medication Needs   Medication Needs: No Needs Identified  ? Financial      ? Legal   Legal: No Needs Identified  ? Other   Other/None: No needs identified    Disposition  ? Expected Discharge Date    Expected Discharge Date: 12/26/16  ? Transportation   Does the patient need discharge transport arranged?: No  Transportation Name, Phone and Availability #1: Pt's dtr [name] [phone]  Transportation Name, Phone and Availability #2: [name] [phone] Does the patient use Medicaid Transportation?: No  ? Next Level of Care (Acute Psych discharges only)      ? Discharge Disposition                                          Durable Medical Equipment     No service has been selected for the patient.      [place] Destination     No service has been selected for the patient.      [facility]     No service has been selected for the patient.      [facility]     No service has been selected for the patient.          Teodora Medici, LMSW  Phone: [phone]  Pager: [phone]

## 2016-12-24 NOTE — Progress Notes
Pt remained on TS throughout this shift. Pt required cues from RN and TS to use call light appropriately and not get out of bed without assistance. Pt did follow commands, used call light appropriately and waited for assistance throughout this shift. Pt will continue to require use of TS for safety concerns.

## 2016-12-24 NOTE — Progress Notes
SPEECH-LANGUAGE PATHOLOGY  REHAB SPEECH DAILY NOTE     SUMMARY: Pt seen for two 30 minute sessions to address dysphagia.     PLAN: Continue current therapy plan with focus on dysphagia and cognition.    FREQUENCY TODAY: 2x     RECOMMENDATIONS  Remain NPO w/ use of PEG for nutrition/hydration/medications.  Frequent oral care (2-3x/daily)  Ice chips (10-15/hour), as tolerated. [facility]    ACTIVITIES FOR THE DAY:  Dysphagia:  Facilitated effortful swallow exercises with spoon dipped in nectar-thick liquid x100 across two sessions w/ min-mod verbal cues for attention and safety required. Prompted pt for volitional cough intermittently during sessions as wet respirations noted x1. Pt expectorated min/mod amount of secretions during 2/4 coughing episodes. Blood in secretions noted x1. Staff aware.    Cognition:  O-Log = 22/24 points possible w/ items administered today. Pt required use of external aids for orientation to time.     Speech Long Term Goals  Will improve skills necessary for ADI's and safety in the home with: Moderate assist  Patient will exhibit safe swallow for least restrictive consistency: Progressing    Speech Short Term Goals  Will demonstrate memory for safety sequences for transfer and ADLs with: 80% accuracy, Moderate cues  Will perform dysphagia exercises with: 80% accuracy, Moderate cues  Will participate in repeat instrumental swallow evaluation when clinically indicated: 100% accuracy, Moderate cues  Patient will tolerate therapeutic trials of nectar liquids: 80% accuracy, Minimal cues    Therapist: Lendell CapriceLauren , M.S., L/CCC-SLP  Date: 12/24/2016

## 2016-12-24 NOTE — Progress Notes
Heart Failure Nursing Progress Note    Admission Date: 12/12/2016  LOS: 12 days    Admission Weight: 60.5 kg (133 lb 6.1 oz)        Most recent weights (inpatient):   Vitals:    12/22/16 0700 12/23/16 0627 12/24/16 0600   Weight: 55.9 kg (123 lb 3.2 oz) 59.4 kg (131 lb) 60.2 kg (132 lb 12.8 oz)     Weight change from previous day: +0.8 kg    Fluid restriction ordered: No    Intake/Output Summary: (Last 24 hours)    Intake/Output Summary (Last 24 hours) at 12/24/16 1716  Last data filed at 12/24/16 1600   Gross per 24 hour   Intake             2210 ml   Output              475 ml   Net             1735 ml       Is patient incontinent Yes.  Is brief scale being used?   Yes    Anticipated discharge date: 12/26/16  Discharge goals: ambulate safely       Daily Assessment of Patient Stated Goals:    Short Term Goal Identified by patient (Short Term=during hospitalization):

## 2016-12-24 NOTE — Progress Notes
Heart Failure Nursing Progress Note    Admission Date: 12/12/2016  LOS: 12 days    Admission Weight: 60.5 kg (133 lb 6.1 oz)        Most recent weights (inpatient):   Vitals:    12/22/16 0700 12/23/16 0627 12/24/16 0600   Weight: 55.9 kg (123 lb 3.2 oz) 59.4 kg (131 lb) 60.2 kg (132 lb 12.8 oz)     Weight change from previous day:+0.8kg    Fluid restriction ordered: N/A    Intake/Output Summary: (Last 24 hours)    Intake/Output Summary (Last 24 hours) at 12/24/16 0645  Last data filed at 12/24/16 [id number]   Gross per 24 hour   Intake             2560 ml   Output              275 ml   Net             2285 ml       Is patient incontinent No    Anticipated discharge date: 12/26/2016  Discharge goals: Ambulate safely      Daily Assessment of Patient Stated Goals:    Short Term Goal Identified by patient (Short Term=during hospitalization):  Continue to work on ambulating safely and using call lights appropriately.

## 2016-12-25 MED ORDER — PANTOPRAZOLE(#) 2MG/ML PO SUSP
40 mg | Freq: Two times a day (BID) | GASTROSTOMY | 1 refills | Status: CN
Start: 2016-12-25 — End: ?

## 2016-12-25 NOTE — Progress Notes
SPEECH-LANGUAGE PATHOLOGY  REHAB SPEECH DAILY/DISCHARGE NOTE     SUMMARY: Pt seen for two 30 minute sessions to address dysphagia and cognition.   ???  PLAN: Pt discharging home with daughter. Recommend consistent supervision initially due to cognitive-linguistic deficits. Recommend ongoing SLP services with focus on dysphagia and cognition. Repeat instrumental assessment of swallow to evaluate dysphagia warranted after further therapy.   ???  FREQUENCY TODAY: 2x   ???  RECOMMENDATIONS  Remain NPO w/ use of PEG for nutrition/hydration/medications.  Frequent oral care (2-3x/daily)  Ice chips (10-15/hour), as tolerated. Details at [facility]    ACTIVITIES FOR THE DAY:  Dysphagia:  Facilitated effortful swallow exercises with 1/4 tsp nectar-thick liquid x100 across two sessions w/ min-mod verbal cues for attention and safety required. Pt expectorated min/mod amount of secretions during 2/3 coughing episodes. Blood in secretions noted x2. Staff aware.    Provided pt review of dysphagia and recommendations for home. Family has been educated as well.     Cognition:  O-Log = 20/24 items administered.     Administered several items from the [place] Cognitive Screening Test to evaluate progress:     Three word memory task:   Immediate: 3/3   Delayed (2 min): 2/3, improving to 3/3 with category cue (total 8/9 points). Baseline = 0/3, improving to 2/3 with recognition cues (total 4/9 points)   Digit span:   Digits forward: 6 (baseline = 5)   Digits reversed: 3 (baseline = 3)    Speech Long Term Goals  Will improve skills necessary for ADI's and safety in the home with: Moderate assist, Progressing  Patient will exhibit safe swallow for least restrictive consistency: Progressing    Speech Short Term Goals  Will demonstrate memory for safety sequences for transfer and ADLs with: 80% accuracy, Moderate cues, Adequate for discharge  Will perform dysphagia exercises with: 80% accuracy, Moderate cues, Achieved Will participate in repeat instrumental swallow evaluation when clinically indicated: Achieved  Patient will tolerate therapeutic trials of nectar liquids: 80% accuracy, Minimal cues, Achieved    Therapist: Lendell Caprice, M.S., L/CCC-SLP  Date: 12/25/2016

## 2016-12-25 NOTE — Progress Notes
Pt remained on Telesitter on this shift. Pt did need occasional prompting to remind him to stay in bed. Pt is easily redirectable and cites, "needing to use the bathroom" as his primary reason for trying to get up without help.

## 2016-12-25 NOTE — Progress Notes
RT Adult Assessment Note    NAME:Javier Gutierrez             MRN: [id number]             DOB:May 21, 1940          AGE: 76 y.o.  ADMISSION DATE: 12/12/2016             DAYS ADMITTED: LOS: 13 days    RT Treatment Plan:  Protocol Plan: Medications  Albuterol: MDI PRN         Additional Comments:  Impressions of the patient: alert   Intervention(s)/outcome(s): none  Patient education that was completed: none  Recommendations to the care team: none      Vital Signs:  Pulse: Pulse: 67  RR:    SpO2:  95%  O2 Device:    Liter Flow:    O2%: O2 Percent: 21 %  Breath Sounds: All Breath Sounds: Clear (implies normal);Decreased  Respiratory Effort: Respiratory Effort: Non-Labored

## 2016-12-25 NOTE — Progress Notes
Heart Failure Nursing Progress Note    Admission Date: 12/12/2016  LOS: 13 days    Admission Weight: 60.5 kg (133 lb 6.1 oz)        Most recent weights (inpatient):   Vitals:    12/22/16 0700 12/23/16 0627 12/24/16 0600   Weight: 55.9 kg (123 lb 3.2 oz) 59.4 kg (131 lb) 60.2 kg (132 lb 12.8 oz)     Weight change from previous day: +0.8 Kg    Fluid restriction ordered: none    Intake/Output Summary: (Last 24 hours)    Intake/Output Summary (Last 24 hours) at 12/25/16 [id number]  Last data filed at 12/25/16 0322   Gross per 24 hour   Intake             1735 ml   Output              675 ml   Net             1060 ml       Is patient incontinent Yes.  Is brief scale being used?   Yes    Anticipated discharge date: 12/5  Discharge goals: stand up and walk      Daily Assessment of Patient Stated Goals:    Short Term Goal Identified by patient (Short Term=during hospitalization):  Feel strong

## 2016-12-25 NOTE — Progress Notes
CLINICAL NUTRITION                                                        Clinical Nutrition Follow-Up Summary     NAME:Javier Gutierrez             MRN: [id number]             DOB:[birth year]          AGE: 76 y.o.  ADMISSION DATE: 12/12/2016             DAYS ADMITTED: LOS: 13 days    Nutrition Assessment of Patient:  Malnutrition Assessment: Malnutrition present  Current Oral Intake: NPO  Estimated Calorie Needs: 1700-1840 (25-27kcal/kg of acute admit wt 68kg)  Estimated Protein Needs: 82-88 (1.2-1.3g/kg of acute admit wt 68kg)  Oral Diet Order: NPO   3-day EN avg:  Intake (calories) Daily Average :  (at goal)  Intake (protein) Daily Average :  (at goal)  Current EN Order: Isosource 1.5 x 5 cartons per day ( TID + q day) + 30ml water flush pre/post bolus + water bolus q6h.      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 PMH of HTN, HLD, CAD, aortic stenosis, CKD, PAD, and lifetime smoker. Bolus feeds started 11/23 at 1 carton x 5 per day and transitioned to a more condensed regimen 11/30. Pt continues to tolerate well. He was sleeping soundly with no family present on attempted visit. RN confirms toleration. Weight is back up, alleviating concern for further weight loss. Current wt of 132#, admit of 133#. Pt is noted to have +fluid status, but unsure of accuracy as no significant edema noted. Per RN, daughter of pt has been infusing TF well with no concerns. Provided PEG education handouts to RN with request to call RD if needed prior to dc on 12/5. Family typically present later in day. Fluid restriction continues; Na+ 134. Stage I to coccyx remains. Last BM 12/1; on bowel meds. No other concerns upon chart review. Pt is stable.     Recommendation:  ??? Continue current EN order of Isosource 1.5 x 5 cartons per day. Recommend 1.5 cartons ( ) bolus feeds TID + 1/2 carton ( ) bolus feed as HS snack.   ??? Add minimum of 30ml water flushes pre/post bolus.   ??? Provides 1875kcal, 85g protein, and free water.   ??? Additional fluids per team.                                     Intervention / Plan:  Provided PEG education materials   Monitor EN, wt, labs, skin, gi, meds, fluids       Nutrition Diagnosis:  Inadequate energy intake  Etiology: prior decreased appetite, dysphagia, inadequate TF.  Signs & Symptoms: documentation of I/Os, continued NPO with EN    Altered GI function  Etiology: dsyphagia  Signs & Symptoms: NPO with continued  EN to meet 100% of needs             Goals:  EN tolerated and meeting >85% of nutritional needs  Time Frame: Within 5 Days  Status: Met;Ongoing                  Dorna Mai, RD, LD *[phone]

## 2016-12-25 NOTE — Progress Notes
Pt remained on TS throughout this RN shift. Pt tried to get out of bed without assistance x3 throughout shift. Pt was redirectable by TS.

## 2016-12-25 NOTE — Progress Notes
Nursing student Candis SchatzMarisol Gutierrez is assisting with patient care from [phone].  Nursing Instructor Val Rilesheresa  [facility].  Lovey Newcomer.  RN

## 2016-12-25 NOTE — Case Management (ED)
Case Management Progress Note    NAME:Javier Gutierrez                          MRN: [id number]              DOB:03-Apr-1940          AGE: 76 y.o.  ADMISSION DATE: 12/12/2016             DAYS ADMITTED: LOS: 13 days      Today???s Date: 12/25/2016    Plan  Anticipate d/c tomorrow 12/5 either to dtr's home with [facility] and enteral or SNF, pending facility acceptance.     Interventions  ? Support   Support: Pt/Family Updates re:POC or DC Plan  ? Info or Referral   Information or Referral to Community Resources: No Needs Identified  ? Discharge Planning   Discharge Planning: Home Health, Durable Medical Equipment and Supplies, Home Infusion-Enteral-TPN     SW contacted [facility] regarding referral sent yesterday. They are still reviewing and requested further speech notes. SW faxed several speech notes, including initial eval to fax:[phone].     SW contacted [name] to review DCP. [name] agreeable to pt d/cing to her home tomorrow 12/5, in the case that [facility] does not accept. [name] remains agreeable to [facility] and [facility]. Training with [facility] tentatively planned for 11 am tomorrow 12/5, prior to d/c.     SW sent referral to [facility] via Epic and they notified SW they can accept.   SW spoke with Amy with [facility], who is agreeable to training pt and dtr [name] tomorrow around 11 am.     ? Medication Needs   Medication Needs: No Needs Identified  ? Financial      ? Legal   Legal: No Needs Identified  ? Other   Other/None: No needs identified    Disposition  ? Expected Discharge Date    Expected Discharge Date: 12/26/16  ? Transportation   Does the patient need discharge transport arranged?: No  Transportation Name, Phone and Availability #1: Pt's dtr [name] [phone]  Transportation Name, Phone and Availability #2: [name] [phone]  Does the patient use Medicaid Transportation?: No  ? Next Level of Care (Acute Psych discharges only)      ? Discharge Disposition Durable Medical Equipment     No service has been selected for the patient.      [place] Destination     No service has been selected for the patient.      [facility]     No service has been selected for the patient.      [facility]     No service has been selected for the patient.          Teodora Medici, LMSW  Phone: [phone]  Pager: [phone]

## 2016-12-26 ENCOUNTER — Inpatient Hospital Stay: Admit: 2016-12-12 | Discharge: 2016-12-26 | Payer: MEDICARE

## 2016-12-26 DIAGNOSIS — I5032 Chronic diastolic (congestive) heart failure: ICD-10-CM

## 2016-12-26 DIAGNOSIS — E785 Hyperlipidemia, unspecified: ICD-10-CM

## 2016-12-26 DIAGNOSIS — I350 Nonrheumatic aortic (valve) stenosis: ICD-10-CM

## 2016-12-26 DIAGNOSIS — I2699 Other pulmonary embolism without acute cor pulmonale: Secondary | ICD-10-CM

## 2016-12-26 DIAGNOSIS — I739 Peripheral vascular disease, unspecified: ICD-10-CM

## 2016-12-26 DIAGNOSIS — E871 Hypo-osmolality and hyponatremia: ICD-10-CM

## 2016-12-26 DIAGNOSIS — R1310 Dysphagia, unspecified: ICD-10-CM

## 2016-12-26 DIAGNOSIS — J850 Gangrene and necrosis of lung: ICD-10-CM

## 2016-12-26 DIAGNOSIS — Z66 Do not resuscitate: ICD-10-CM

## 2016-12-26 DIAGNOSIS — I429 Cardiomyopathy, unspecified: ICD-10-CM

## 2016-12-26 DIAGNOSIS — D62 Acute posthemorrhagic anemia: ICD-10-CM

## 2016-12-26 DIAGNOSIS — J869 Pyothorax without fistula: ICD-10-CM

## 2016-12-26 DIAGNOSIS — I2510 Atherosclerotic heart disease of native coronary artery without angina pectoris: ICD-10-CM

## 2016-12-26 DIAGNOSIS — Z931 Gastrostomy status: ICD-10-CM

## 2016-12-26 DIAGNOSIS — E039 Hypothyroidism, unspecified: ICD-10-CM

## 2016-12-26 DIAGNOSIS — R6521 Severe sepsis with septic shock: ICD-10-CM

## 2016-12-26 DIAGNOSIS — A419 Sepsis, unspecified organism: Principal | ICD-10-CM

## 2016-12-26 DIAGNOSIS — N183 Chronic kidney disease, stage 3 (moderate): ICD-10-CM

## 2016-12-26 DIAGNOSIS — F17210 Nicotine dependence, cigarettes, uncomplicated: ICD-10-CM

## 2016-12-26 DIAGNOSIS — Z9181 History of falling: ICD-10-CM

## 2016-12-26 DIAGNOSIS — I130 Hypertensive heart and chronic kidney disease with heart failure and stage 1 through stage 4 chronic kidney disease, or unspecified chronic kidney disease: ICD-10-CM

## 2016-12-26 DIAGNOSIS — Z952 Presence of prosthetic heart valve: ICD-10-CM

## 2016-12-26 LAB — CBC CELLULAR THERAPEUTICS: Lab: 6.8 K/UL — ABNORMAL LOW (ref >60)

## 2016-12-26 LAB — BASIC METABOLIC PANEL CELLULAR THERAPEUTICS: Lab: 133 MMOL/L — ABNORMAL LOW (ref >60)

## 2016-12-26 MED ORDER — DOXAZOSIN 1 MG PO TAB
1 mg | ORAL_TABLET | Freq: Every evening | GASTROSTOMY | 1 refills | 30.00000 days | Status: AC
Start: 2016-12-26 — End: 2018-02-21

## 2016-12-26 MED ORDER — ASPIRIN 81 MG PO CHEW
81 mg | ORAL_TABLET | Freq: Every day | GASTROSTOMY | 1 refills | Status: AC
Start: 2016-12-26 — End: 2018-02-21

## 2016-12-26 MED ORDER — LEVOTHYROXINE 100 MCG PO TAB
100 ug | ORAL_TABLET | Freq: Every day | GASTROSTOMY | 1 refills | 30.00000 days | Status: AC
Start: 2016-12-26 — End: 2017-04-16

## 2016-12-26 MED ORDER — MONTELUKAST 10 MG PO TAB
10 mg | ORAL_TABLET | Freq: Every day | GASTROSTOMY | 1 refills | 90.00000 days | Status: AC
Start: 2016-12-26 — End: 2018-02-21

## 2016-12-26 MED ORDER — CARVEDILOL 12.5 MG PO TAB
12.5 mg | ORAL_TABLET | Freq: Two times a day (BID) | GASTROSTOMY | 1 refills | 90.00000 days | Status: AC
Start: 2016-12-26 — End: 2018-02-21

## 2016-12-26 MED ORDER — ESOMEPRAZOLE MAGNESIUM 40 MG PO CPDR
40 mg | ORAL_CAPSULE | Freq: Two times a day (BID) | GASTROSTOMY | 1 refills | 30.00000 days | Status: AC
Start: 2016-12-26 — End: 2018-02-21

## 2016-12-26 NOTE — Case Management (ED)
Case Management Progress Note    NAME:Javier Gutierrez                          MRN: [id number]              DOB:12-Oct-1940          AGE: 76 y.o.  ADMISSION DATE: 12/12/2016             DAYS ADMITTED: LOS: 14 days      Today???s Date: 12/26/2016    Plan  Pt to d/c to [facility] today 12/5. Pt's dtr [name] to transport mid afternoon.     Interventions  ? Support   Support: Pt/Family Updates re:POC or DC Plan   ? Info or Referral   Information or Referral to Community Resources: No Needs Identified  ? Discharge Planning   Discharge Planning: Home Health, Durable Medical Equipment and Supplies, Home Infusion-Enteral-TPN   SW contacted by Tresa Endo with [facility] reporting they can accept. Kelly agreeable to providing transport or having pt's dtr transport.   SW contacted pt's dtr [name] to provide update on SNF acceptance. Tresa Endo reports wanting to talk to pt about it and will notify SW of final decision. SW inquired about training with [facility] today and [name] wants to go ahead with training.     SW later met with pt and [name], who both report they are agreeable to SNF today. [name] plans to drive pt herself. SW agreeable to canceling [facility] and [facility].   SW notified [facility] that pt wants to admit today. They are agreeable to requested admission around 2 pm.     SW later notified by Tresa Endo with [facility] that they don't have pt's Isosource 1/5 in stock and will need to order. Tresa Endo requested that pt be discharged with a few days supply. SW spoke with dietary, who are agreeable to sending 2 days supply with pt. Pt also has sample pack from [facility] that can be used. SW confirmed this plan with Tresa Endo, who is agreeable and reports they should have formula in stock within 2 days.     SW faxed interfacility orders to [facility].   SW notified nursing of RN Report to [phone].  SW placed transfer packet in [place].     ? Medication Needs   Medication Needs: No Needs Identified  ? Financial      ? Legal Legal: No Needs Identified  ? Other   Other/None: No needs identified    Disposition  ? Expected Discharge Date    Expected Discharge Date: 12/26/16  ? Transportation   Does the patient need discharge transport arranged?: No  Transportation Name, Phone and Availability #1: Pt's dtr [name] [phone]  Transportation Name, Phone and Availability #2: [name] [phone]  Does the patient use Medicaid Transportation?: No  ? Next Level of Care (Acute Psych discharges only)      ? Discharge Disposition                                          Durable Medical Equipment     No service has been selected for the patient.      [place] Destination - Selection Complete     Event organiser Address Phone Number Fax Number    [facility] Nursing Facility [address] [place], [place] [address] [phone]      [facility]     No service has been selected for the patient.      [facility]     No service has been selected for the patient.          Teodora Medici, LMSW  Phone: [phone]  Pager: [phone]

## 2016-12-26 NOTE — Progress Notes
Heart Failure Nursing Progress Note    Admission Date: 12/12/2016  LOS: 14 days    Admission Weight: 60.5 kg (133 lb 6.1 oz)        Most recent weights (inpatient):   Vitals:    12/23/16 0627 12/24/16 0600 12/26/16 0600   Weight: 59.4 kg (131 lb) 60.2 kg (132 lb 12.8 oz) 60.1 kg (132 lb 6.4 oz)     Weight change from previous day: - 0.1kg    Fluid restriction ordered: none    Intake/Output Summary: (Last 24 hours)    Intake/Output Summary (Last 24 hours) at 12/26/16 [id number]  Last data filed at 12/26/16 [id number]   Gross per 24 hour   Intake             2190 ml   Output             1125 ml   Net             1065 ml       Is patient incontinent No    Anticipated discharge date: 12/5  Discharge goals: to ambulate safely       Daily Assessment of Patient Stated Goals:    Short Term Goal Identified by patient (Short Term=during hospitalization):  To feel strong

## 2016-12-26 NOTE — Progress Notes
CLINICAL NUTRITION                                                        Clinical Nutrition Note    NAME:Javier Gutierrez             MRN: [id number]             DOB:11-28-1940          AGE: 76 y.o.  ADMISSION DATE: 12/12/2016             DAYS ADMITTED: LOS: 14 days    Nutrition Assessment of Patient:  Malnutrition Assessment: Malnutrition present  Current Oral Intake: NPO  Estimated Calorie Needs: 1700-1840 (25-27kcal/kg of acute admit wt 68kg)  Estimated Protein Needs: 82-88 (1.2-1.3g/kg of acute admit wt 68kg)  Oral Diet Order: NPO  Current EN order: Isosource 1.5 of (1.5 cartons) TID + (1/2 carton) as HS snack (total 5 cartons/day). Provides 1875kcal, 85g protein, free water. Additional water bolus q6h + 30ml pre/post bolus feeds.     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 PMH of HTN, HLD, CAD, aortic stenosis, CKD, PAD, and lifetime smoker. Bolus feeds started 11/23 at 1 carton x 5 per day and transitioned to a more condensed regimen 11/30. Pt continues to tolerate well with no s/s of intolerance. On fluid restriction per hyponatremia (today Na+ 133). Weight is stable. Received message from SW and RD assistance regarding provision of 2 days of Isosource 1.5 on discharge today to facility without Isosource or Jevity products on hand and needing time for delivery. Arranged to have 2 days worth of product (10 cartons) provided to pt on dc. Follow up call from SW reported facility with Fibersource and Nutren. Provided recs for Fibersource, a comparable product in increased amount.     Recommendation:  ??? Continue Isosource 1.5 x 5 cartons daily provided facility can obtain: (1.5 cartons) TID + (1/2 carton) as HS snack (total 5 cartons/day)  ??? Will provide 1875kcal, 85g protein, and free water.   ??? Add minimum 30ml water flushes pre/post feeds and water bolus q6h per MD order.     ??? If unable to provide Isosource 1.5, switch to Fibersource HN @ 6.5 cartons daily.   ??? Recommend bolus feeds of 1.5 cartons ( ) TID + 2 cartons ( ) q day.   ??? Fibersource HN will provide 1950kcal, 88g protein, and free water.   ??? Add 30 ml water flush pre/post feeds.   ??? Additional fluids per MD pending hyponatremia; current order of q6h will meet baseline daily fluid goals of 1 ml/kcal between EN, flushes, and water bolus (total free water ).                               Intervention / Plan:  New TF recs as alternative      Dorna Mai, RD, LD [phone]

## 2016-12-26 NOTE — Progress Notes
Heart Failure Nursing Progress Note    Admission Date: 12/12/2016  LOS: 13 days    Admission Weight: 60.5 kg (133 lb 6.1 oz)        Most recent weights (inpatient):   Vitals:    12/22/16 0700 12/23/16 0627 12/24/16 0600   Weight: 55.9 kg (123 lb 3.2 oz) 59.4 kg (131 lb) 60.2 kg (132 lb 12.8 oz)     Weight change from previous day:+0.8kg    Fluid restriction ordered: none    Intake/Output Summary: (Last 24 hours)    Intake/Output Summary (Last 24 hours) at 12/25/16 1823  Last data filed at 12/25/16 1748   Gross per 24 hour   Intake             1735 ml   Output              925 ml   Net              810 ml       Is patient incontinent No    Anticipated discharge date: 12/26/16  Discharge goals: ambulate safely      Daily Assessment of Patient Stated Goals:    Short Term Goal Identified by patient (Short Term=during hospitalization):

## 2016-12-26 NOTE — Progress Notes
Pt remained on telesitter on this shift. Pt needed prompting to call for help and not get out of bed alone. Pt followed direction of TS.

## 2016-12-26 NOTE — Progress Notes
PALLIATIVE CARE INPATIENT PROGRESS NOTE     Date of Service: 12/26/16  LOS: 14     ASSESSMENT/PLAN     Javier Gutierrez is a 76 y.o. male with hypertension, hyperlipidemia, coronary artery disease, aortic stenosis, CKD, PAD and lifetime smoker, admitted 10/30 through [facility] for concern with weakness and cough. He was found to have a large left pneumonia, c/b complicated empyema requiring iv abx and chest tube. With concerns of aspiration, dysphagia, peg tube was placed on 12/06/16. Pt had acute blood loss anemia w/ bleeding around tube requiring blood. Pt also had hemoptysis that is thought to be 2/2 necrotizing pna and in workup was found to have segmental PE. Patient stabilize and subsequently transitioned to  inpt rehab 11/21. Palliative care following for support.     We met with patient and her daughter/DPOA, Javier Gutierrez. Both patient and daughter are in good spirits,     Patient seen resting in chair, alert, and pleasant. He denies any complaints of pain, SOA or any discomfort except for reporting that his daughter is not allowing him to smoke cigarette. Briefly discussed importance of smoking cessation and interventions to help with discomfort such as nicotine patch, which Javier Gutierrez will discuss with his physician.     Javier Gutierrez verbalizes that pt's recovery has been progressing well as they hoped for. She also  reports that patient is agreeable to go to SNF to continue therapy and hoping that he could return to his home in the near future.     ADVANCE CARE PLANNING:  Code Status: DNAR - FI, no intubation  DPOA: paperwork on file with O2 - dtr Javier Gutierrez is his POA.  TPOPP:  completed     We discussed and confirmed with patient and his daughter regarding his code status as above. Discussed and completed TPOPP.     Patient care collaborated with Palliative care attending Dr. Estill Batten DO    Javier Gutierrez  ANP-BC, [facility]  Palliative Care Nurse Practitioner  Available on [place]  Office: [phone] Nights/Weekends - Page [phone] for Palliative Care On-Call    Total time spent in care of this patient today on unit is 25 minutes, more than 50% of the time was spent in face to face contact with patient/family at bedside doing coordination of care, completion of TPOPP and counseling as outlined above.

## 2016-12-26 NOTE — Rehab Team Conference
Team Conference Note     Date of Admission:  12/12/2016  Date of Team Conference:  12/26/2016   Javier Gutierrez is a 76 y.o. male.     DOB: Oct 02, 1940                  MRN#:  [id number]    Team Conference   Attendees: Jana Hakim MD, Attending Physician; Sande Brothers MD, Resident Physician; Gibson Ramp MD, Resident Physician; Alli McGuinn LMSW, Social Work; Lonia Blood PharmD, Pharmacy; Mellody Dance PhD, ABPP, Neuropsychology; Marlyn Corporal PsyD, Neuropsychology Fellow; Lavonna Rua MA, Neuropsychology Intern; Sheria Lang RN, Nurse Manager; Arlyss Gandy OTR, Occupational Therapy; Hilary Wingate DPT, Physical Therapy; Lendell Caprice SLP, Speech Therapy; Michaelyn Barter RN, Rehabilitation Program Coordinator; Dorna Mai RD LD, Clinical Nutrition; Kendell Bane RN, Nursing    Medical Update: 76 yo M with PMH of diastolic CHF, AS s/p TAVR (2017), CAD, CKD, HTN, who presented to the ED 10/30 w/ weakness and SOB. CT chest revealed left sided lung consolidation concerning for PNA w/ effusion + suspicious lung mass with ongoing hemoptysis, now improved. Presents to rehab with debility secondary to complicated parapneumonic effusion. He completed Augmentin course yesterday.   - For pulmonary embolism, continue singulair 10mg  QD, albuterol inaler, robitussin, PPI 40mg  BID  - Ongoing dysphagia s/p PEG tube, NPO  - For hx of CAD/PAD, HF, HTN, HLD continuing low dose co-reg  - For BPH, on doxazosin   - Hypothyroidism, continue PTA synthroid    Team Goal:  Patient will perform: community mobility, at ambulation level, Independent, Adequate for discharge (discontinue, new goal: supervision)   Pt will perform basic care and transfer with: Modified independence (least restrictive device)     Discharge Planning     Discharge Date:  12/26/16    Living Situation Prior to Admission  ??? Living Arrangements  Type of Residence: Home, independent  Living Arrangements: Alone  Financial risk analyst / Tub: Tub/Shower Unit How many levels in the residence?: 1  Can patient live on one level if needed?: Yes  Does residence have entry and/or side stairs?: No (Second floor apartment with no entry steps and reliable elevator access. )  Assistance needed prior to admit or anticipated on discharge: No  Who provides assistance or could if needed?: Pt's dtr [name], son [name]  Are they in good health?: Yes  Can support system provide 24/7 care if needed?: No   ???  Pt lives alone in a 2nd floor apartment with no entry steps and reliable elevator access. Pt's apartment complex is a Museum/gallery exhibitions officer, but they do not offer any assistance. Pt's bathroom is accessible by walker, with standard toilet, and tub/shower combo with grab bars and hand held shower head. Pt's adult children, [name] and [name], are his main supports. Both work full time, but [name] works Holiday representative, so has flexible hours in the winter. Pt has another son, [name], who lives in [place]. All of the adult children are married, and pt has 4 adult grandchildren, one who is studying to become a Engineer, civil (consulting). Pt's family can provide intermittent support and physical assistance as needed. [name] reports that pt will likely d/c to dtr [name]'s home upon d/c for a few weeks. [name]'s home is single level and accessible. Pt was independent with ADL's, including driving, without device prior to admission. Bill was setting up pt's pill box once weekly, but pt managed all his own finances. Pt owns a roller walker that the family purchased out of pocket  prior to admission.       Plan Ongoing home health OT/PT/SLP    DME tub transfer bench, potentially wheelchair/transport chair pending discussion with patient and family            Rehabilitation Plan     Progress  Great family support  Pt is able to safely ambulate and complete ADLs in room with family  Videoswallow 11/28: Minimal improvement noted since previous VSS completed 12/04/16 - moderate/severe oropharyngeal dysphagia characterized by trace silent aspiration thin and nectar thick liquids before to during the swallow secondary to mistimed airway protection and incomplete laryngeal vestibular closure. Difficulty clearing penetrated/aspirated material or pharyngeal residue. Strategies minimally effective.  Cognitive impairments minimally improved; recommend consistent supervision upon d/c.     Barriers/Concerns  Pt is far form baseline and with unknown etiology of deficits, thus unsure of prognosis for recovery - may require more long term assistance  Pt frequently declines participation in therapy, requires max coaxing or distraction to participate  Mod-severe oropharyngeal dysphagia in relation to current respiratory status compounded by debility. Anticipate slow progression; ongoing dysphagia therapy warranted with f/u OP videoswallow after d/c.  At least moderate cognitive deficits within areas of orientation, insight, attention, recall, and auditory comprehension.  pt has a visual impairment and is hard of hearing. He sometimes has difficulty expressing himself and can be confused at times.     Plan  Per SW, patient's daughter is now considering SNF. At this time, recommend consistent supervision and assist.    Pt did not complain of pain. PT is a Q2 turn with a stage 1 on his coccyx. Pt can be confused at times and needs some prompting. Pt is monitored via the telesitter.     Weekly Team Goals  Family training  Determine d/c plan   Timed voids to encourage continence    Goals   Speech Long Term Goals  Will improve skills necessary for ADI's and safety in the home with: Moderate assist, Progressing  Patient will exhibit safe swallow for least restrictive consistency: Progressing  Weekly Goals  Weekly Bed Mobility Goals: Patient will complete rolling with, Patient will perform sit to supine with, Patient will perform supine to sit with Patient will perform rolling with: Stand by assistance, Achieved  Patient will perform sit to supine with: Stand by assistance, Achieved  Patient will perform supine to sit with: Stand by assistance, Achieved  Weekly Transfer Goals: Patient will complete sit to stand transfer with, Patient will complete stand to sit transfer with, Patient will complete stand pivot transfer with  Patient will complete sit to stand transfer with: Stand by assistance, Adequate for discharge  Patient will complete stand to sit transfer with: Stand by assistance, Progressing, Adequate for discharge  Patient will complete stand pivot transfer with: Stand by assistance, Adequate for discharge  Weekly Ambulation/Stairs Goals: Patient will ambulate, Patient will ascend/descend  Patient will ambulate: 500 feet, No device, Stand by assistance, Adequate for discharge  Patient will ascend/descend: 12 stairs, Stand by assistance, Adequate for discharge  Weekly Goals  ADL Goals: Bathing, Dressing UE, Dressing LE, Grooming, Toileting  Patient Will Perform UE Dressing: w/ Stand By Assist  Patient Will Perform LE Dressing: w/ Minimum Assist  Patient Will Perform Grooming: Standing at Sink, w/ Minimum Assist  Patient Will Perform Toileting: w/ Stand By Assist  Pt Will Perform All Functional Transfers: w/ Stand By Assist  Speech Short Term Goals  Will demonstrate memory for safety sequences for transfer and ADLs with:  80% accuracy, Moderate cues, Adequate for discharge  Will perform dysphagia exercises with: 80% accuracy, Moderate cues, Achieved  Will participate in repeat instrumental swallow evaluation when clinically indicated: Achieved  Patient will tolerate therapeutic trials of nectar liquids: 80% accuracy, Minimal cues, Achieved  Nursing Short Term Goals  Bladder Management: Yes  Will decrease the number of bladder accidents during the day and/or night to: Progressing  Patient will follow time voiding program independently with: Maximum verbal cues  Bowel Management: Yes  Will verbalize need to have a bowel movement daily/every other day (for constipation) with: Moderate verbal cues  Patient / Caregiver will verbalize knowledge of activity in relation to constipation with: Moderate verbal cues  Medication Management: Yes  Will verbalize reason for medication with: Maximum verbal / written cues  Will verbalize dose and time for medication with: Maximum verbal / written cues  Will verbalize side effects of medication with: Maximum verbal / written cues  Skin Integrity: Yes  Will verbalize optimal skin care routine with: Moderate verbal cues  Will verbalize 2-3 risk factors for pressure ulcers with: Moderate verbal / written cues  Will be able to call for pressure reliefs every 30 minutes when up in chair with: Moderate verbal / written cues  Pain Management: No  Safety: Yes  Will demonstrate understanding of mobility precautions with: Moderate verbal cues  Will demonstrate understanding of own limitations with: Moderate verbal cues  Will verbalize the importance of using call light with: Moderate verbal cues    Functional Independence Measures   Eating FIM: 1 - Total assistance, tube feeding for nutrition and/or hydration  Grooming FIM: 5 - Set up  Bathing FIM: 5 - Set up  Dressing - Upper Body FIM: 5 - Set up  Dressing - Lower Body FIM: 5 - Set up  Toileting FIM: 5 - Set up  Bladder FIM: 5 - Emptying device by staff  Bowel FIM: 6 - No bowel movement, medication  Transfers FIM: 5 - Supervision  Toilet Transfers FIM: 5 - Cues   Shower Transfers FIM: 5 - Cues  Gait: 4 - Contact guard assistance, greater than or equal to 150 feet  Stairs FIM: 4 - Minimal contact guard assistance, greater than or equal to 12 stairs  Comprehension FIM: 4 - Minimal prompting. Understands directions/conversation about basic daily needs 75-90%  Expression FIM: 4 - Minimal prompting - Expresses directions/conversation about basic daily needs 75-90% Social Interaction FIM: 4 - Minimal direction - Able to interact appropriately 75-90%  Problem Solving FIM: 3 - Moderate prompting - Able to solve routine problems 50-74%. Needs direction less than half of the time to initiate, plan or complete daily activities  Memory FIM: 3 - Moderate promting - Able to recognize people frequently encountered, remembers daily routines, responds to requests of others 50-74% of the time, needs prompting less than half of the time

## 2016-12-26 NOTE — Progress Notes
Pt remained on TS throughout this RN shift. Pt tried to get out of bed without assistance x1 throughout shift. Pt was redirectable by TS.

## 2016-12-31 LAB — CULTURE-FUNGAL,OTHER

## 2017-01-21 LAB — CULTURE-TB (AFB)

## 2017-01-23 ENCOUNTER — Encounter: Admit: 2017-01-23 | Discharge: 2017-01-23 | Payer: MEDICARE

## 2017-01-23 ENCOUNTER — Ambulatory Visit: Admit: 2017-01-23 | Discharge: 2017-01-23 | Payer: MEDICARE

## 2017-01-23 DIAGNOSIS — K635 Polyp of colon: ICD-10-CM

## 2017-01-23 DIAGNOSIS — I6529 Occlusion and stenosis of unspecified carotid artery: ICD-10-CM

## 2017-01-23 DIAGNOSIS — I350 Nonrheumatic aortic (valve) stenosis: ICD-10-CM

## 2017-01-23 DIAGNOSIS — I429 Cardiomyopathy, unspecified: ICD-10-CM

## 2017-01-23 DIAGNOSIS — E785 Hyperlipidemia, unspecified: ICD-10-CM

## 2017-01-23 DIAGNOSIS — R54 Age-related physical debility: ICD-10-CM

## 2017-01-23 DIAGNOSIS — Z8701 Personal history of pneumonia (recurrent): Principal | ICD-10-CM

## 2017-01-23 DIAGNOSIS — F17200 Nicotine dependence, unspecified, uncomplicated: ICD-10-CM

## 2017-01-23 DIAGNOSIS — Z8679 Personal history of other diseases of the circulatory system: ICD-10-CM

## 2017-01-23 DIAGNOSIS — R9389 Abnormal findings on diagnostic imaging of other specified body structures: Principal | ICD-10-CM

## 2017-01-23 DIAGNOSIS — I7090 Unspecified atherosclerosis: ICD-10-CM

## 2017-01-23 DIAGNOSIS — I10 Essential (primary) hypertension: ICD-10-CM

## 2017-01-23 DIAGNOSIS — E039 Hypothyroidism, unspecified: ICD-10-CM

## 2017-01-23 DIAGNOSIS — M1990 Unspecified osteoarthritis, unspecified site: ICD-10-CM

## 2017-01-23 DIAGNOSIS — Z720 Tobacco use: ICD-10-CM

## 2017-01-23 DIAGNOSIS — I2510 Atherosclerotic heart disease of native coronary artery without angina pectoris: ICD-10-CM

## 2017-01-23 DIAGNOSIS — J45909 Unspecified asthma, uncomplicated: ICD-10-CM

## 2017-01-23 DIAGNOSIS — Z86711 Personal history of pulmonary embolism: ICD-10-CM

## 2017-01-23 DIAGNOSIS — I5023 Acute on chronic systolic (congestive) heart failure: ICD-10-CM

## 2017-01-23 MED ORDER — SODIUM CHLORIDE 0.9 % IJ SOLN
50 mL | Freq: Once | INTRAVENOUS | 0 refills | Status: CP
Start: 2017-01-23 — End: ?
  Administered 2017-01-23: 21:00:00 50 mL via INTRAVENOUS

## 2017-01-23 MED ORDER — IOHEXOL 350 MG IODINE/ML IV SOLN
65 mL | Freq: Once | INTRAVENOUS | 0 refills | Status: CP
Start: 2017-01-23 — End: ?
  Administered 2017-01-23: 21:00:00 65 mL via INTRAVENOUS

## 2017-01-29 ENCOUNTER — Encounter: Admit: 2017-01-29 | Discharge: 2017-01-29 | Payer: MEDICARE

## 2017-01-29 DIAGNOSIS — I6529 Occlusion and stenosis of unspecified carotid artery: ICD-10-CM

## 2017-01-29 DIAGNOSIS — Z720 Tobacco use: ICD-10-CM

## 2017-01-29 DIAGNOSIS — I10 Essential (primary) hypertension: ICD-10-CM

## 2017-01-29 DIAGNOSIS — R54 Age-related physical debility: ICD-10-CM

## 2017-01-29 DIAGNOSIS — M1990 Unspecified osteoarthritis, unspecified site: ICD-10-CM

## 2017-01-29 DIAGNOSIS — I5023 Acute on chronic systolic (congestive) heart failure: ICD-10-CM

## 2017-01-29 DIAGNOSIS — I2510 Atherosclerotic heart disease of native coronary artery without angina pectoris: ICD-10-CM

## 2017-01-29 DIAGNOSIS — E039 Hypothyroidism, unspecified: ICD-10-CM

## 2017-01-29 DIAGNOSIS — J45909 Unspecified asthma, uncomplicated: ICD-10-CM

## 2017-01-29 DIAGNOSIS — I350 Nonrheumatic aortic (valve) stenosis: ICD-10-CM

## 2017-01-29 DIAGNOSIS — K635 Polyp of colon: ICD-10-CM

## 2017-01-29 DIAGNOSIS — I429 Cardiomyopathy, unspecified: ICD-10-CM

## 2017-01-29 DIAGNOSIS — Z8679 Personal history of other diseases of the circulatory system: ICD-10-CM

## 2017-01-29 DIAGNOSIS — E785 Hyperlipidemia, unspecified: ICD-10-CM

## 2017-01-29 DIAGNOSIS — I7090 Unspecified atherosclerosis: ICD-10-CM

## 2017-02-04 ENCOUNTER — Emergency Department: Admit: 2017-02-04 | Discharge: 2017-02-04 | Payer: MEDICARE

## 2017-02-04 ENCOUNTER — Emergency Department: Admit: 2017-02-04 | Discharge: 2017-02-04 | Disposition: A | Discharge status: Home / Self Care | Payer: MEDICARE

## 2017-02-04 ENCOUNTER — Encounter: Admit: 2017-02-04 | Discharge: 2017-02-04 | Payer: MEDICARE

## 2017-02-04 DIAGNOSIS — Z720 Tobacco use: ICD-10-CM

## 2017-02-04 DIAGNOSIS — Z8673 Personal history of transient ischemic attack (TIA), and cerebral infarction without residual deficits: ICD-10-CM

## 2017-02-04 DIAGNOSIS — Z952 Presence of prosthetic heart valve: ICD-10-CM

## 2017-02-04 DIAGNOSIS — R062 Wheezing: Principal | ICD-10-CM

## 2017-02-04 DIAGNOSIS — R05 Cough: ICD-10-CM

## 2017-02-04 DIAGNOSIS — I509 Heart failure, unspecified: ICD-10-CM

## 2017-02-04 LAB — URINALYSIS DIPSTICK
Lab: 7 mg/dL — ABNORMAL LOW (ref 5.0–8.0)
Lab: NEGATIVE 10*3/uL — ABNORMAL LOW (ref 3–12)
Lab: NEGATIVE MMOL/L — ABNORMAL HIGH (ref 21–30)
Lab: NEGATIVE U/L (ref 7–56)
Lab: NEGATIVE U/L — ABNORMAL LOW (ref 25–110)
Lab: NEGATIVE g/dL — ABNORMAL LOW (ref 3.5–5.0)
Lab: NEGATIVE g/dL — ABNORMAL LOW (ref 6.0–8.0)
Lab: NEGATIVE mg/dL — ABNORMAL HIGH (ref 8.5–10.6)
Lab: NEGATIVE mg/dL — ABNORMAL LOW (ref 0.3–1.2)

## 2017-02-04 LAB — POC TROPONIN: Lab: 0 ng/mL (ref 0.00–0.05)

## 2017-02-04 LAB — URINALYSIS, MICROSCOPIC

## 2017-02-04 LAB — COMPREHENSIVE METABOLIC PANEL
Lab: 136 MMOL/L — ABNORMAL LOW (ref >=60)
Lab: 4.1 MMOL/L — ABNORMAL LOW (ref 3.5–5.1)
Lab: >60 mL/min (ref >60)
Lab: >60 mL/min (ref >60)

## 2017-02-04 LAB — POC LACTATE: Lab: 1.2 MMOL/L (ref 0.5–2.0)

## 2017-02-04 LAB — CBC AND DIFF
Lab: 0.1 10*3/uL (ref 0–0.20)
Lab: 0.2 10*3/uL (ref 0–0.45)
Lab: 7 10*3/uL — ABNORMAL LOW (ref >=60)

## 2017-02-04 MED ORDER — ALBUTEROL SULFATE 2.5 MG/0.5 ML IN NEBU
2.5 mg | Freq: Once | RESPIRATORY_TRACT | 0 refills | Status: CP
Start: 2017-02-04 — End: ?
  Administered 2017-02-04: 17:00:00 2.5 mg via RESPIRATORY_TRACT

## 2017-02-07 LAB — THYROID STIMULATING HORMONE-TSH: Lab: 11 — ABNORMAL HIGH (ref 0.35–4.94)

## 2017-02-07 LAB — BASIC METABOLIC PANEL
Lab: 1.1 — ABNORMAL LOW (ref 33.0–37.0)
Lab: 103 — ABNORMAL LOW (ref 42.0–52.0)
Lab: 138 — ABNORMAL LOW (ref 4.70–6.10)
Lab: 14
Lab: 18
Lab: 26
Lab: 66
Lab: 9.5
Lab: 93

## 2017-02-07 LAB — CBC: Lab: 11 — ABNORMAL HIGH (ref 4.8–10.8)

## 2017-04-16 ENCOUNTER — Ambulatory Visit: Admit: 2017-04-16 | Discharge: 2017-04-17 | Payer: MEDICARE

## 2017-04-16 ENCOUNTER — Encounter: Admit: 2017-04-16 | Discharge: 2017-04-16 | Payer: MEDICARE

## 2017-04-16 DIAGNOSIS — Z952 Presence of prosthetic heart valve: ICD-10-CM

## 2017-04-16 DIAGNOSIS — I2510 Atherosclerotic heart disease of native coronary artery without angina pectoris: ICD-10-CM

## 2017-04-16 DIAGNOSIS — I444 Left anterior fascicular block: ICD-10-CM

## 2017-04-16 DIAGNOSIS — I5032 Chronic diastolic (congestive) heart failure: ICD-10-CM

## 2017-04-16 DIAGNOSIS — Z8679 Personal history of other diseases of the circulatory system: ICD-10-CM

## 2017-04-16 DIAGNOSIS — I10 Essential (primary) hypertension: ICD-10-CM

## 2017-04-16 DIAGNOSIS — I6523 Occlusion and stenosis of bilateral carotid arteries: ICD-10-CM

## 2017-04-16 DIAGNOSIS — Z8701 Personal history of pneumonia (recurrent): ICD-10-CM

## 2017-04-16 DIAGNOSIS — E039 Hypothyroidism, unspecified: ICD-10-CM

## 2017-04-16 DIAGNOSIS — K635 Polyp of colon: ICD-10-CM

## 2017-04-16 DIAGNOSIS — I739 Peripheral vascular disease, unspecified: ICD-10-CM

## 2017-04-16 DIAGNOSIS — R5381 Other malaise: ICD-10-CM

## 2017-04-16 DIAGNOSIS — R54 Age-related physical debility: ICD-10-CM

## 2017-04-16 DIAGNOSIS — Z720 Tobacco use: ICD-10-CM

## 2017-04-16 DIAGNOSIS — E785 Hyperlipidemia, unspecified: ICD-10-CM

## 2017-04-16 DIAGNOSIS — E7800 Pure hypercholesterolemia, unspecified: ICD-10-CM

## 2017-04-16 DIAGNOSIS — I350 Nonrheumatic aortic (valve) stenosis: ICD-10-CM

## 2017-04-16 DIAGNOSIS — M1990 Unspecified osteoarthritis, unspecified site: ICD-10-CM

## 2017-04-16 DIAGNOSIS — N183 Chronic kidney disease, stage 3 (moderate): ICD-10-CM

## 2017-04-16 DIAGNOSIS — I429 Cardiomyopathy, unspecified: ICD-10-CM

## 2017-04-16 DIAGNOSIS — I7090 Unspecified atherosclerosis: ICD-10-CM

## 2017-04-16 DIAGNOSIS — J9601 Acute respiratory failure with hypoxia: ICD-10-CM

## 2017-04-16 DIAGNOSIS — J45909 Unspecified asthma, uncomplicated: ICD-10-CM

## 2017-04-16 DIAGNOSIS — I6529 Occlusion and stenosis of unspecified carotid artery: ICD-10-CM

## 2017-04-16 DIAGNOSIS — I5023 Acute on chronic systolic (congestive) heart failure: ICD-10-CM

## 2017-04-16 MED ORDER — ALBUTEROL SULFATE 90 MCG/ACTUATION IN HFAA
2 | RESPIRATORY_TRACT | 1 refills | Status: AC | PRN
Start: 2017-04-16 — End: ?

## 2017-04-17 ENCOUNTER — Encounter: Admit: 2017-04-17 | Discharge: 2017-04-17 | Payer: MEDICARE

## 2017-04-18 ENCOUNTER — Encounter: Admit: 2017-04-18 | Discharge: 2017-04-18 | Payer: MEDICARE

## 2017-04-25 ENCOUNTER — Encounter: Admit: 2017-04-25 | Discharge: 2017-04-25 | Payer: MEDICARE

## 2017-04-25 DIAGNOSIS — Z720 Tobacco use: ICD-10-CM

## 2017-04-25 DIAGNOSIS — I10 Essential (primary) hypertension: ICD-10-CM

## 2017-04-25 DIAGNOSIS — I350 Nonrheumatic aortic (valve) stenosis: ICD-10-CM

## 2017-04-25 DIAGNOSIS — E785 Hyperlipidemia, unspecified: ICD-10-CM

## 2017-04-25 DIAGNOSIS — J45909 Unspecified asthma, uncomplicated: ICD-10-CM

## 2017-04-25 DIAGNOSIS — I5023 Acute on chronic systolic (congestive) heart failure: ICD-10-CM

## 2017-04-25 DIAGNOSIS — E039 Hypothyroidism, unspecified: ICD-10-CM

## 2017-04-25 DIAGNOSIS — I6529 Occlusion and stenosis of unspecified carotid artery: ICD-10-CM

## 2017-04-25 DIAGNOSIS — I429 Cardiomyopathy, unspecified: ICD-10-CM

## 2017-04-25 DIAGNOSIS — I2510 Atherosclerotic heart disease of native coronary artery without angina pectoris: ICD-10-CM

## 2017-04-25 DIAGNOSIS — I7090 Unspecified atherosclerosis: ICD-10-CM

## 2017-04-25 DIAGNOSIS — R54 Age-related physical debility: ICD-10-CM

## 2017-04-25 DIAGNOSIS — Z8679 Personal history of other diseases of the circulatory system: ICD-10-CM

## 2017-04-25 DIAGNOSIS — K635 Polyp of colon: ICD-10-CM

## 2017-04-25 DIAGNOSIS — M1990 Unspecified osteoarthritis, unspecified site: ICD-10-CM

## 2017-05-02 LAB — BASIC METABOLIC PANEL
Lab: 1.2 — ABNORMAL HIGH (ref 0.72–1.25)
Lab: 13
Lab: 29 — ABNORMAL HIGH (ref 8.4–25.7)
Lab: 57 — ABNORMAL HIGH (ref 11.5–14.5)
Lab: 88
Lab: 9.5

## 2017-08-06 ENCOUNTER — Ambulatory Visit: Admit: 2017-08-06 | Discharge: 2017-08-07 | Payer: MEDICARE

## 2017-08-06 ENCOUNTER — Encounter: Admit: 2017-08-06 | Discharge: 2017-08-06 | Payer: MEDICARE

## 2017-08-06 DIAGNOSIS — I350 Nonrheumatic aortic (valve) stenosis: ICD-10-CM

## 2017-08-06 DIAGNOSIS — I10 Essential (primary) hypertension: ICD-10-CM

## 2017-08-06 DIAGNOSIS — E7800 Pure hypercholesterolemia, unspecified: ICD-10-CM

## 2017-08-06 DIAGNOSIS — I2510 Atherosclerotic heart disease of native coronary artery without angina pectoris: ICD-10-CM

## 2017-08-06 DIAGNOSIS — R54 Age-related physical debility: ICD-10-CM

## 2017-08-06 DIAGNOSIS — I6523 Occlusion and stenosis of bilateral carotid arteries: ICD-10-CM

## 2017-08-06 DIAGNOSIS — E039 Hypothyroidism, unspecified: ICD-10-CM

## 2017-08-06 DIAGNOSIS — E785 Hyperlipidemia, unspecified: ICD-10-CM

## 2017-08-06 DIAGNOSIS — K635 Polyp of colon: ICD-10-CM

## 2017-08-06 DIAGNOSIS — R634 Abnormal weight loss: ICD-10-CM

## 2017-08-06 DIAGNOSIS — I429 Cardiomyopathy, unspecified: ICD-10-CM

## 2017-08-06 DIAGNOSIS — J45909 Unspecified asthma, uncomplicated: ICD-10-CM

## 2017-08-06 DIAGNOSIS — I739 Peripheral vascular disease, unspecified: ICD-10-CM

## 2017-08-06 DIAGNOSIS — Z720 Tobacco use: ICD-10-CM

## 2017-08-06 DIAGNOSIS — I444 Left anterior fascicular block: ICD-10-CM

## 2017-08-06 DIAGNOSIS — Z8679 Personal history of other diseases of the circulatory system: ICD-10-CM

## 2017-08-06 DIAGNOSIS — N183 Chronic kidney disease, stage 3 (moderate): ICD-10-CM

## 2017-08-06 DIAGNOSIS — I7090 Unspecified atherosclerosis: ICD-10-CM

## 2017-08-06 DIAGNOSIS — I6529 Occlusion and stenosis of unspecified carotid artery: ICD-10-CM

## 2017-08-06 DIAGNOSIS — Z952 Presence of prosthetic heart valve: ICD-10-CM

## 2017-08-06 DIAGNOSIS — I2699 Other pulmonary embolism without acute cor pulmonale: ICD-10-CM

## 2017-08-06 DIAGNOSIS — I5023 Acute on chronic systolic (congestive) heart failure: ICD-10-CM

## 2017-08-06 DIAGNOSIS — I5032 Chronic diastolic (congestive) heart failure: ICD-10-CM

## 2017-08-06 DIAGNOSIS — M1990 Unspecified osteoarthritis, unspecified site: ICD-10-CM

## 2017-08-08 ENCOUNTER — Encounter: Admit: 2017-08-08 | Discharge: 2017-08-08 | Payer: MEDICARE

## 2017-10-01 ENCOUNTER — Encounter: Admit: 2017-10-01 | Discharge: 2017-10-01 | Payer: MEDICARE

## 2017-10-09 ENCOUNTER — Ambulatory Visit: Admit: 2017-10-09 | Discharge: 2017-10-09 | Payer: MEDICARE

## 2017-10-09 ENCOUNTER — Encounter: Admit: 2017-10-09 | Discharge: 2017-10-09 | Payer: MEDICARE

## 2017-10-09 DIAGNOSIS — I350 Nonrheumatic aortic (valve) stenosis: ICD-10-CM

## 2017-10-09 DIAGNOSIS — Z8701 Personal history of pneumonia (recurrent): Secondary | ICD-10-CM

## 2017-10-09 DIAGNOSIS — I10 Essential (primary) hypertension: ICD-10-CM

## 2017-10-09 DIAGNOSIS — E039 Hypothyroidism, unspecified: ICD-10-CM

## 2017-10-09 DIAGNOSIS — I429 Cardiomyopathy, unspecified: ICD-10-CM

## 2017-10-09 DIAGNOSIS — Z720 Tobacco use: ICD-10-CM

## 2017-10-09 DIAGNOSIS — J4550 Severe persistent asthma, uncomplicated: Principal | ICD-10-CM

## 2017-10-09 DIAGNOSIS — I5023 Acute on chronic systolic (congestive) heart failure: ICD-10-CM

## 2017-10-09 DIAGNOSIS — F17200 Nicotine dependence, unspecified, uncomplicated: ICD-10-CM

## 2017-10-09 DIAGNOSIS — K635 Polyp of colon: ICD-10-CM

## 2017-10-09 DIAGNOSIS — J45909 Unspecified asthma, uncomplicated: ICD-10-CM

## 2017-10-09 DIAGNOSIS — Z8679 Personal history of other diseases of the circulatory system: ICD-10-CM

## 2017-10-09 DIAGNOSIS — I7090 Unspecified atherosclerosis: ICD-10-CM

## 2017-10-09 DIAGNOSIS — I2510 Atherosclerotic heart disease of native coronary artery without angina pectoris: ICD-10-CM

## 2017-10-09 DIAGNOSIS — E785 Hyperlipidemia, unspecified: ICD-10-CM

## 2017-10-09 DIAGNOSIS — I6529 Occlusion and stenosis of unspecified carotid artery: ICD-10-CM

## 2017-10-09 DIAGNOSIS — M1990 Unspecified osteoarthritis, unspecified site: ICD-10-CM

## 2017-10-09 DIAGNOSIS — R54 Age-related physical debility: ICD-10-CM

## 2017-11-27 ENCOUNTER — Ambulatory Visit: Admit: 2017-11-27 | Discharge: 2017-11-28 | Payer: MEDICARE

## 2017-11-27 DIAGNOSIS — I10 Essential (primary) hypertension: Principal | ICD-10-CM

## 2017-11-27 MED ORDER — PERFLUTREN LIPID MICROSPHERES 1.1 MG/ML IV SUSP
1-20 mL | Freq: Once | INTRAVENOUS | 0 refills | Status: AC | PRN
Start: 2017-11-27 — End: ?

## 2017-11-28 ENCOUNTER — Encounter: Admit: 2017-11-28 | Discharge: 2017-11-28 | Payer: MEDICARE

## 2017-11-29 ENCOUNTER — Encounter: Admit: 2017-11-29 | Discharge: 2017-11-29 | Payer: MEDICARE

## 2018-02-05 ENCOUNTER — Encounter: Admit: 2018-02-05 | Discharge: 2018-02-05 | Payer: MEDICARE

## 2018-02-11 ENCOUNTER — Encounter: Admit: 2018-02-11 | Discharge: 2018-02-11 | Payer: MEDICARE

## 2018-02-11 DIAGNOSIS — I6310 Cerebral infarction due to embolism of unspecified precerebral artery: ICD-10-CM

## 2018-02-11 DIAGNOSIS — Z8679 Personal history of other diseases of the circulatory system: ICD-10-CM

## 2018-02-11 DIAGNOSIS — Z952 Presence of prosthetic heart valve: ICD-10-CM

## 2018-02-11 DIAGNOSIS — I6312 Cerebral infarction due to embolism of basilar artery: ICD-10-CM

## 2018-02-11 DIAGNOSIS — I350 Nonrheumatic aortic (valve) stenosis: ICD-10-CM

## 2018-02-11 DIAGNOSIS — R5381 Other malaise: ICD-10-CM

## 2018-02-11 DIAGNOSIS — E7800 Pure hypercholesterolemia, unspecified: ICD-10-CM

## 2018-02-11 LAB — URINALYSIS DIPSTICK REFLEX TO CULTURE
Lab: NEGATIVE
Lab: NEGATIVE
Lab: NEGATIVE
Lab: NEGATIVE
Lab: NEGATIVE
Lab: NEGATIVE
Lab: NEGATIVE
Lab: NEGATIVE

## 2018-02-11 LAB — URINALYSIS MICROSCOPIC REFLEX TO CULTURE

## 2018-02-11 LAB — THYROID STIMULATING HORMONE-TSH: Lab: 0.6 uU/mL (ref 0.35–5.00)

## 2018-02-11 LAB — AMMONIA: Lab: 47 umol/L — ABNORMAL HIGH (ref 9–35)

## 2018-02-11 MED ORDER — DUTASTERIDE 0.5 MG PO CAP
0.5 mg | Freq: Every day | ORAL | 0 refills | Status: DC
Start: 2018-02-11 — End: 2018-02-21
  Administered 2018-02-12 – 2018-02-21 (×9): 0.5 mg via ORAL

## 2018-02-11 MED ORDER — NICARDIPINE IV DRIP (STD CONC)
5-15 mg/h | INTRAVENOUS | 0 refills | Status: DC
Start: 2018-02-11 — End: 2018-02-14
  Administered 2018-02-11 – 2018-02-12 (×6): 5 mg/h via INTRAVENOUS

## 2018-02-11 MED ORDER — PANTOPRAZOLE 40 MG PO TBEC
40 mg | Freq: Every day | ORAL | 0 refills | Status: DC
Start: 2018-02-11 — End: 2018-02-18
  Administered 2018-02-13 – 2018-02-17 (×5): 40 mg via ORAL

## 2018-02-11 MED ORDER — ENOXAPARIN 40 MG/0.4 ML SC SYRG
40 mg | Freq: Every day | SUBCUTANEOUS | 0 refills | Status: DC
Start: 2018-02-11 — End: 2018-02-16
  Administered 2018-02-12 – 2018-02-16 (×5): 40 mg via SUBCUTANEOUS

## 2018-02-11 MED ORDER — SODIUM CHLORIDE 0.9 % IJ SOLN
80 mL | Freq: Once | INTRAVENOUS | 0 refills | Status: CP
Start: 2018-02-11 — End: ?
  Administered 2018-02-11: 19:00:00 80 mL via INTRAVENOUS

## 2018-02-11 MED ORDER — CLOPIDOGREL 75 MG PO TAB
75 mg | Freq: Every day | ORAL | 0 refills | Status: DC
Start: 2018-02-11 — End: 2018-02-17
  Administered 2018-02-12 – 2018-02-16 (×5): 75 mg via ORAL

## 2018-02-11 MED ORDER — DOXAZOSIN 2 MG PO TAB
2 mg | Freq: Every day | ORAL | 0 refills | Status: DC
Start: 2018-02-11 — End: 2018-02-17
  Administered 2018-02-12 – 2018-02-16 (×5): 2 mg via ORAL

## 2018-02-11 MED ORDER — IOHEXOL 350 MG IODINE/ML IV SOLN
100 mL | Freq: Once | INTRAVENOUS | 0 refills | Status: CP
Start: 2018-02-11 — End: ?
  Administered 2018-02-11: 19:00:00 100 mL via INTRAVENOUS

## 2018-02-11 MED ORDER — MONTELUKAST 10 MG PO TAB
10 mg | Freq: Every evening | ORAL | 0 refills | Status: DC
Start: 2018-02-11 — End: 2018-02-18
  Administered 2018-02-13 – 2018-02-17 (×5): 10 mg via ORAL

## 2018-02-11 MED ORDER — ALBUTEROL SULFATE 90 MCG/ACTUATION IN HFAA
2 | RESPIRATORY_TRACT | 0 refills | Status: DC | PRN
Start: 2018-02-11 — End: 2018-02-21

## 2018-02-11 MED ORDER — ASPIRIN 300 MG RE SUPP
300 mg | Freq: Every day | RECTAL | 0 refills | Status: DC
Start: 2018-02-11 — End: 2018-02-13
  Administered 2018-02-11: 300 mg via RECTAL

## 2018-02-11 MED ORDER — FLUTICASONE PROPIONATE 110 MCG/ACTUATION IN HFAA
2 | Freq: Two times a day (BID) | RESPIRATORY_TRACT | 0 refills | Status: DC
Start: 2018-02-11 — End: 2018-02-21
  Administered 2018-02-12: 2 via RESPIRATORY_TRACT

## 2018-02-11 MED ORDER — ACETAMINOPHEN 650 MG RE SUPP
650 mg | RECTAL | 0 refills | Status: DC | PRN
Start: 2018-02-11 — End: 2018-02-21

## 2018-02-11 MED ORDER — SODIUM CHLORIDE 0.9 % IV SOLP
INTRAVENOUS | 0 refills | Status: DC
Start: 2018-02-11 — End: 2018-02-11

## 2018-02-11 MED ORDER — ASPIRIN 81 MG PO CHEW
81 mg | Freq: Every day | ORAL | 0 refills | Status: DC
Start: 2018-02-11 — End: 2018-02-11

## 2018-02-11 MED ORDER — SODIUM CHLORIDE 0.9 % IV SOLP
INTRAVENOUS | 0 refills | Status: AC
Start: 2018-02-11 — End: ?
  Administered 2018-02-11 – 2018-02-12 (×2): 1000.000 mL via INTRAVENOUS

## 2018-02-11 MED ORDER — CARVEDILOL 12.5 MG PO TAB
25 mg | Freq: Two times a day (BID) | ORAL | 0 refills | Status: DC
Start: 2018-02-11 — End: 2018-02-17
  Administered 2018-02-12 – 2018-02-17 (×10): 25 mg via ORAL

## 2018-02-11 MED ORDER — LEVOTHYROXINE 100 MCG IV SOLR
75 ug | Freq: Every day | INTRAVENOUS | 0 refills | Status: DC
Start: 2018-02-11 — End: 2018-02-11

## 2018-02-11 MED ORDER — ACETAMINOPHEN 325 MG PO TAB
650 mg | ORAL | 0 refills | Status: DC | PRN
Start: 2018-02-11 — End: 2018-02-21

## 2018-02-12 LAB — CBC: Lab: 7.7 K/UL — ABNORMAL LOW (ref >60)

## 2018-02-12 LAB — BASIC METABOLIC PANEL
Lab: 141 MMOL/L — ABNORMAL LOW (ref 137–147)
Lab: 3.7 MMOL/L — ABNORMAL LOW (ref 3.5–5.1)

## 2018-02-12 LAB — LIPID PROFILE: Lab: 190 mg/dL — ABNORMAL LOW (ref <200)

## 2018-02-12 MED ORDER — HYDRALAZINE 20 MG/ML IJ SOLN
10 mg | INTRAVENOUS | 0 refills | Status: DC | PRN
Start: 2018-02-12 — End: 2018-02-18
  Administered 2018-02-12 – 2018-02-16 (×3): 10 mg via INTRAVENOUS

## 2018-02-12 MED ORDER — PERFLUTREN LIPID MICROSPHERES 1.1 MG/ML IV SUSP
1-20 mL | Freq: Once | INTRAVENOUS | 0 refills | Status: AC | PRN
Start: 2018-02-12 — End: ?

## 2018-02-12 MED ORDER — ATORVASTATIN 40 MG PO TAB
40 mg | Freq: Every day | ORAL | 0 refills | Status: DC
Start: 2018-02-12 — End: 2018-02-21
  Administered 2018-02-12 – 2018-02-21 (×9): 40 mg via ORAL

## 2018-02-12 MED ORDER — LEVOTHYROXINE 112 MCG PO TAB
112 ug | Freq: Every day | ORAL | 0 refills | Status: DC
Start: 2018-02-12 — End: 2018-02-21
  Administered 2018-02-13 – 2018-02-21 (×8): 112 ug via ORAL

## 2018-02-13 LAB — CBC: Lab: 9.9 K/UL — ABNORMAL LOW (ref >60)

## 2018-02-13 LAB — HEMOGLOBIN A1C: Lab: 5.4 % (ref 4.0–6.0)

## 2018-02-13 LAB — BASIC METABOLIC PANEL: Lab: 140 MMOL/L — ABNORMAL LOW (ref >60)

## 2018-02-13 MED ORDER — LISINOPRIL 20 MG PO TAB
20 mg | Freq: Every day | ORAL | 0 refills | Status: DC
Start: 2018-02-13 — End: 2018-02-17
  Administered 2018-02-13 – 2018-02-16 (×4): 20 mg via ORAL

## 2018-02-13 MED ORDER — ASPIRIN 325 MG PO TAB
325 mg | Freq: Every day | ORAL | 0 refills | Status: DC
Start: 2018-02-13 — End: 2018-02-17
  Administered 2018-02-13 – 2018-02-16 (×4): 325 mg via ORAL

## 2018-02-14 ENCOUNTER — Inpatient Hospital Stay: Admit: 2018-02-12 | Discharge: 2018-02-12 | Payer: MEDICARE

## 2018-02-14 DIAGNOSIS — I6381 Other cerebral infarction due to occlusion or stenosis of small artery: Principal | ICD-10-CM

## 2018-02-14 MED ORDER — PANTOPRAZOLE 40 MG PO TBEC
40 mg | ORAL_TABLET | Freq: Every day | ORAL | 0 refills | Status: CN
Start: 2018-02-14 — End: ?

## 2018-02-14 MED ORDER — CARVEDILOL 25 MG PO TAB
25 mg | ORAL_TABLET | Freq: Two times a day (BID) | ORAL | 3 refills | Status: CN
Start: 2018-02-14 — End: ?

## 2018-02-14 MED ORDER — LISINOPRIL 20 MG PO TAB
20 mg | ORAL_TABLET | Freq: Every day | ORAL | 3 refills | Status: CN
Start: 2018-02-14 — End: ?

## 2018-02-14 MED ORDER — ATORVASTATIN 40 MG PO TAB
40 mg | ORAL_TABLET | Freq: Every day | ORAL | 3 refills | Status: CN
Start: 2018-02-14 — End: ?

## 2018-02-15 LAB — CBC
Lab: 33 g/dL (ref 32.0–36.0)
Lab: 8.9 K/UL — ABNORMAL LOW (ref <100)
Lab: 81 FL — ABNORMAL LOW (ref 80–100)

## 2018-02-15 MED ORDER — SODIUM CHLORIDE 0.9 % IV SOLP
1000 mL | Freq: Once | INTRAVENOUS | 0 refills | Status: CP
Start: 2018-02-15 — End: ?
  Administered 2018-02-15: 15:00:00 1000 mL via INTRAVENOUS

## 2018-02-16 LAB — CBC: Lab: 33 g/dL — ABNORMAL HIGH (ref >60)

## 2018-02-16 LAB — BASIC METABOLIC PANEL
Lab: 140 MMOL/L — ABNORMAL HIGH (ref >60)
Lab: 52 mL/min — ABNORMAL LOW (ref >60)

## 2018-02-16 MED ORDER — AMLODIPINE 5 MG PO TAB
5 mg | Freq: Every day | ORAL | 0 refills | Status: DC
Start: 2018-02-16 — End: 2018-02-17
  Administered 2018-02-16: 19:00:00 5 mg via ORAL

## 2018-02-17 ENCOUNTER — Encounter: Admit: 2018-02-17 | Discharge: 2018-02-17 | Payer: MEDICARE

## 2018-02-17 ENCOUNTER — Inpatient Hospital Stay: Admit: 2018-02-17 | Discharge: 2018-02-17 | Payer: MEDICARE

## 2018-02-17 LAB — BASIC METABOLIC PANEL
Lab: 1.4 mg/dL — ABNORMAL HIGH (ref 0.4–1.24)
Lab: 81 mg/dL — ABNORMAL HIGH (ref >60)

## 2018-02-17 LAB — BLOOD GASES, ARTERIAL
Lab: 21 MMOL/L (ref 21–28)
Lab: 273 mmHg — ABNORMAL HIGH (ref 80–100)
Lab: 3.7 MMOL/L
Lab: 46 mmHg — ABNORMAL HIGH (ref 35–45)
Lab: 7.3 % — ABNORMAL LOW (ref 7.35–7.45)
Lab: 99 % — ABNORMAL HIGH (ref 95–99)

## 2018-02-17 LAB — HEMOGLOBIN & HEMATOCRIT, BG: Lab: 10 g/dL — ABNORMAL LOW (ref 13.5–16.5)

## 2018-02-17 LAB — CBC: Lab: 81 FL — ABNORMAL HIGH (ref 80–100)

## 2018-02-17 MED ORDER — PHENYLEPHRINE IN 0.9% NACL(PF) 1 MG/10 ML (100 MCG/ML) IV SYRG
INTRAVENOUS | 0 refills | Status: DC
Start: 2018-02-17 — End: 2018-02-17
  Administered 2018-02-17 (×2): 50 ug via INTRAVENOUS
  Administered 2018-02-17: 21:00:00 100 ug via INTRAVENOUS

## 2018-02-17 MED ORDER — REMIFENTANYL 1000MCG IN NS 20ML (OR)
INTRAVENOUS | 0 refills | Status: DC
Start: 2018-02-17 — End: 2018-02-17
  Administered 2018-02-17: 14:00:00 .07 ug/kg/min via INTRAVENOUS
  Administered 2018-02-17 (×2): 20.000 mL via INTRAVENOUS
  Administered 2018-02-17: 14:00:00 .07 ug/kg/min via INTRAVENOUS

## 2018-02-17 MED ORDER — ALBUTEROL SULFATE 90 MCG/ACTUATION IN HFAA
0 refills | Status: DC
Start: 2018-02-17 — End: 2018-02-17
  Administered 2018-02-17: 14:00:00 6 via RESPIRATORY_TRACT

## 2018-02-17 MED ORDER — ROCURONIUM 10 MG/ML IV SOLN
INTRAVENOUS | 0 refills | Status: DC
Start: 2018-02-17 — End: 2018-02-17
  Administered 2018-02-17: 17:00:00 20 mg via INTRAVENOUS
  Administered 2018-02-17: 14:00:00 40 mg via INTRAVENOUS
  Administered 2018-02-17 (×3): 20 mg via INTRAVENOUS

## 2018-02-17 MED ORDER — CEFAZOLIN 1 GRAM IJ SOLR
0 refills | Status: DC
Start: 2018-02-17 — End: 2018-02-17
  Administered 2018-02-17 (×2): 2 g via INTRAVENOUS

## 2018-02-17 MED ORDER — SODIUM CHLORIDE 0.9 % IV SOLP
1000 mL | INTRAVENOUS | 0 refills | Status: DC
Start: 2018-02-17 — End: 2018-02-17
  Administered 2018-02-17 (×2): 1000.000 mL via INTRAVENOUS

## 2018-02-17 MED ORDER — IOHEXOL 350 MG IODINE/ML IV SOLN
60 mL | Freq: Once | INTRAVENOUS | 0 refills | Status: CP
Start: 2018-02-17 — End: ?
  Administered 2018-02-17: 21:00:00 60 mL via INTRAVENOUS

## 2018-02-17 MED ORDER — CEFAZOLIN INJ 1GM IVP
2 g | INTRAVENOUS | 0 refills | Status: DC
Start: 2018-02-17 — End: 2018-02-18
  Administered 2018-02-18 (×2): 2 g via INTRAVENOUS

## 2018-02-17 MED ORDER — LIDOCAINE (PF) 200 MG/10 ML (2 %) IJ SYRG
0 refills | Status: DC
Start: 2018-02-17 — End: 2018-02-17
  Administered 2018-02-17: 14:00:00 70 mg via INTRAVENOUS

## 2018-02-17 MED ORDER — PROPOFOL 10 MG/ML IV EMUL (INFUSION)(AM)(OR)
INTRAVENOUS | 0 refills | Status: DC
Start: 2018-02-17 — End: 2018-02-17
  Administered 2018-02-17 (×2): 100.000 mL via INTRAVENOUS
  Administered 2018-02-17: 14:00:00 150 ug/kg/min via INTRAVENOUS

## 2018-02-17 MED ORDER — DEXAMETHASONE SODIUM PHOSPHATE 4 MG/ML IJ SOLN
INTRAVENOUS | 0 refills | Status: DC
Start: 2018-02-17 — End: 2018-02-17
  Administered 2018-02-17: 15:00:00 4 mg via INTRAVENOUS

## 2018-02-17 MED ORDER — ONDANSETRON HCL (PF) 4 MG/2 ML IJ SOLN
INTRAVENOUS | 0 refills | Status: DC
Start: 2018-02-17 — End: 2018-02-17
  Administered 2018-02-17: 22:00:00 4 mg via INTRAVENOUS

## 2018-02-17 MED ORDER — HEPARIN (PORCINE) 1,000 UNIT/ML IJ SOLN
0 refills | Status: DC
Start: 2018-02-17 — End: 2018-02-17
  Administered 2018-02-17: 16:00:00 6000 [IU] via INTRAVENOUS
  Administered 2018-02-17: 17:00:00 1500 [IU] via INTRAVENOUS
  Administered 2018-02-17: 18:00:00 1000 [IU] via INTRAVENOUS

## 2018-02-17 MED ORDER — PROPOFOL INJ 10 MG/ML IV VIAL
INTRAVENOUS | 0 refills | Status: DC
Start: 2018-02-17 — End: 2018-02-17
  Administered 2018-02-17: 15:00:00 30 mg via INTRAVENOUS
  Administered 2018-02-17: 15:00:00 20 mg via INTRAVENOUS
  Administered 2018-02-17: 14:00:00 120 mg via INTRAVENOUS

## 2018-02-17 MED ORDER — PANTOPRAZOLE 40 MG IV SOLR
40 mg | Freq: Every day | INTRAVENOUS | 0 refills | Status: DC
Start: 2018-02-17 — End: 2018-02-19
  Administered 2018-02-18 – 2018-02-19 (×3): 40 mg via INTRAVENOUS

## 2018-02-17 MED ORDER — CLOPIDOGREL 75 MG PO TAB
150 mg | OROGASTRIC | 0 refills | Status: CP
Start: 2018-02-17 — End: ?
  Administered 2018-02-17: 22:00:00 150 mg via OROGASTRIC

## 2018-02-17 MED ORDER — NICARDIPINE IN NACL (ISO-OS) 40 MG/200 ML IV PGBK (INFUSION)(AM)(OR)
0 refills | Status: DC
Start: 2018-02-17 — End: 2018-02-17
  Administered 2018-02-17: 22:00:00 5 mg/h via INTRAVENOUS

## 2018-02-17 MED ORDER — ELECTROLYTE-A IV SOLP
0 refills | Status: DC
Start: 2018-02-17 — End: 2018-02-17
  Administered 2018-02-17: 21:00:00 via INTRAVENOUS

## 2018-02-17 MED ORDER — PROPOFOL INJ 10 MG/ML IV VIAL
0 refills | Status: DC
Start: 2018-02-17 — End: 2018-02-17

## 2018-02-17 MED ORDER — ELECTROLYTE-A IV SOLP
0 refills | Status: DC
Start: 2018-02-17 — End: 2018-02-17
  Administered 2018-02-17 (×2): via INTRAVENOUS

## 2018-02-17 MED ORDER — HEPARIN 5,000 UNITS IN NS 500 ML IRR SOLN (OR)
Freq: Once | 0 refills | Status: AC
Start: 2018-02-17 — End: ?

## 2018-02-17 MED ORDER — PHENYLEPHRINE IV DRIP (DBL CONC)
0 refills | Status: DC
Start: 2018-02-17 — End: 2018-02-17
  Administered 2018-02-17 (×2): .3 ug/kg/min via INTRAVENOUS

## 2018-02-17 MED ORDER — THROMBIN (BOVINE) 5,000 UNIT TP SOLR
0 refills | Status: DC
Start: 2018-02-17 — End: 2018-02-17
  Administered 2018-02-17: 15:00:00 5000 [IU] via TOPICAL

## 2018-02-17 MED ORDER — FENTANYL CITRATE (PF) 50 MCG/ML IJ SOLN
0 refills | Status: DC
Start: 2018-02-17 — End: 2018-02-17
  Administered 2018-02-17 (×2): 50 ug via INTRAVENOUS

## 2018-02-17 MED ORDER — NITROGLYCERIN 200MCG SYRINGE (OR)(OSM)
0 refills | Status: DC
Start: 2018-02-17 — End: 2018-02-17
  Administered 2018-02-17 (×2): 20 ug via INTRAVENOUS

## 2018-02-17 MED ORDER — IOHEXOL 300 MG IODINE/ML IV SOLN
50 mL | Freq: Once | INTRA_ARTERIAL | 0 refills | Status: CP
Start: 2018-02-17 — End: ?
  Administered 2018-02-17: 22:00:00 50 mL via INTRA_ARTERIAL

## 2018-02-17 MED ORDER — LIDOCAINE (PF) 10 MG/ML (1 %) IJ SOLN
.1-2 mL | INTRAMUSCULAR | 0 refills | Status: DC | PRN
Start: 2018-02-17 — End: 2018-02-17

## 2018-02-17 MED ORDER — BACITRACIN 50,000 UN LR 500 ML IRR BOT (OR)
0 refills | Status: DC
Start: 2018-02-17 — End: 2018-02-17
  Administered 2018-02-17 (×2): 500 mL

## 2018-02-17 MED ORDER — PROPOFOL 10 MG/ML IV EMUL (INFUSION)(AM)(OR)
0 refills | Status: DC
Start: 2018-02-17 — End: 2018-02-17
  Administered 2018-02-17: 21:00:00 60 ug/kg/min via INTRAVENOUS

## 2018-02-17 MED ORDER — SUGAMMADEX 100 MG/ML IV SOLN
INTRAVENOUS | 0 refills | Status: DC
Start: 2018-02-17 — End: 2018-02-17
  Administered 2018-02-17: 22:00:00 160 mg via INTRAVENOUS

## 2018-02-17 MED ORDER — SODIUM CHLORIDE 0.9 % IJ SOLN
50 mL | Freq: Once | INTRAVENOUS | 0 refills | Status: CP
Start: 2018-02-17 — End: ?
  Administered 2018-02-17: 21:00:00 50 mL via INTRAVENOUS

## 2018-02-17 MED ORDER — LIDOCAINE-EPINEPHRINE 1 %-1:100,000 IJ SOLN
0 refills | Status: DC
Start: 2018-02-17 — End: 2018-02-17
  Administered 2018-02-17: 15:00:00 10 mL via INTRAMUSCULAR

## 2018-02-17 MED ORDER — PHENYLEPHRINE IV DRIP (STD CONC)
0 refills | Status: DC
Start: 2018-02-17 — End: 2018-02-17
  Administered 2018-02-17 (×2): 0.5 ug/kg/min via INTRAVENOUS

## 2018-02-17 MED ORDER — PHENYLEPHRINE IN 0.9% NACL(PF) 1 MG/10 ML (100 MCG/ML) IV SYRG
INTRAVENOUS | 0 refills | Status: DC
Start: 2018-02-17 — End: 2018-02-17

## 2018-02-17 MED ORDER — CLOPIDOGREL 75 MG PO TAB
300 mg | Freq: Once | OROGASTRIC | 0 refills | Status: CP
Start: 2018-02-17 — End: ?
  Administered 2018-02-17: 22:00:00 300 mg via OROGASTRIC

## 2018-02-17 MED ORDER — PHENYLEPHRINE IV DRIP (STD CONC)
0.5-3 ug/kg/min | INTRAVENOUS | 0 refills | Status: DC
Start: 2018-02-17 — End: 2018-02-18
  Administered 2018-02-18 (×2): 0.5 ug/kg/min via INTRAVENOUS

## 2018-02-17 MED ORDER — HEPARIN 5,000 UNITS IN NS 500 ML IRR SOLN (OR)
0 refills | Status: DC
Start: 2018-02-17 — End: 2018-02-17
  Administered 2018-02-17 (×2): 500 mL

## 2018-02-17 MED ORDER — PHENYLEPHRINE IN 0.9% NACL(PF) 1 MG/10 ML (100 MCG/ML) IV SYRG
0 refills | Status: DC
Start: 2018-02-17 — End: 2018-02-17
  Administered 2018-02-17 (×2): 100 ug via INTRAVENOUS
  Administered 2018-02-17 (×2): 50 ug via INTRAVENOUS

## 2018-02-17 MED ORDER — DEXTRAN 70-HYPROMELLOSE (PF) 0.1-0.3 % OP DPET
0 refills | Status: DC
Start: 2018-02-17 — End: 2018-02-17
  Administered 2018-02-17: 14:00:00 2 [drp] via OPHTHALMIC

## 2018-02-17 MED ORDER — ASPIRIN 325 MG PO TAB
325 mg | Freq: Every day | OROGASTRIC | 0 refills | Status: DC
Start: 2018-02-17 — End: 2018-02-17
  Administered 2018-02-17: 22:00:00 325 mg via OROGASTRIC

## 2018-02-18 ENCOUNTER — Encounter: Admit: 2018-02-18 | Discharge: 2018-02-18 | Payer: MEDICARE

## 2018-02-18 LAB — PHOSPHORUS: Lab: 4.1 mg/dL — ABNORMAL HIGH (ref 2.0–4.5)

## 2018-02-18 LAB — CBC
Lab: 167 10*3/uL (ref 150–400)
Lab: 17 % — ABNORMAL HIGH (ref 11–15)
Lab: 26 pg (ref 26–34)
Lab: 3.9 M/UL — ABNORMAL LOW (ref 4.4–5.5)
Lab: 31 % — ABNORMAL LOW (ref 40–50)
Lab: 80 FL (ref 80–100)
Lab: 9.2 FL (ref 7–11)
Lab: 10 g/dL — ABNORMAL LOW (ref 13.5–16.5)
Lab: 12 10*3/uL — ABNORMAL HIGH (ref 4.5–11.0)
Lab: 160 10*3/uL (ref 150–400)
Lab: 17 % — ABNORMAL HIGH (ref 11–15)
Lab: 27 pg (ref 26–34)
Lab: 3.6 M/UL — ABNORMAL LOW (ref 4.4–5.5)
Lab: 33 g/dL (ref 32.0–36.0)
Lab: 81 FL (ref 80–100)
Lab: 12 K/UL — ABNORMAL HIGH (ref >60)
Lab: 178 K/UL (ref 150–400)
Lab: 29 % — ABNORMAL LOW (ref 40–50)
Lab: 32 g/dL (ref >60)
Lab: 9.8 g/dL — ABNORMAL LOW (ref 13.5–16.5)
Lab: 13 10*3/uL — ABNORMAL HIGH (ref 4.5–11.0)
Lab: 17 % — ABNORMAL HIGH (ref 11–15)
Lab: 177 10*3/uL (ref 150–400)
Lab: 27 pg (ref 26–34)
Lab: 28 % — ABNORMAL LOW (ref 40–50)
Lab: 3.4 M/UL — ABNORMAL LOW (ref 4.4–5.5)
Lab: 33 g/dL (ref 32.0–36.0)
Lab: 81 FL (ref 80–100)
Lab: 9.5 FL (ref 7–11)
Lab: 9.6 g/dL — ABNORMAL LOW (ref 13.5–16.5)

## 2018-02-18 LAB — CBC AND DIFF
Lab: 0 10*3/uL (ref 0–0.20)
Lab: 0 10*3/uL (ref 0–0.45)
Lab: 16 10*3/uL — ABNORMAL HIGH (ref 4.5–11.0)
Lab: 3.6 M/UL — ABNORMAL LOW (ref >60)

## 2018-02-18 LAB — BASIC METABOLIC PANEL
Lab: 139 MMOL/L — ABNORMAL HIGH (ref 137–147)
Lab: 22 MMOL/L — ABNORMAL HIGH (ref >60)
Lab: 53 mL/min — ABNORMAL LOW (ref >60)

## 2018-02-18 LAB — SODIUM-URINE RANDOM: Lab: 54 MMOL/L

## 2018-02-18 LAB — COMPREHENSIVE METABOLIC PANEL
Lab: 0.3 mg/dL (ref 0.3–1.2)
Lab: 135 MMOL/L — ABNORMAL LOW (ref 137–147)
Lab: 14 U/L (ref 7–40)
Lab: 178 mg/dL — ABNORMAL HIGH (ref 70–100)
Lab: 22 MMOL/L (ref 21–30)
Lab: 3 g/dL — ABNORMAL LOW (ref 3.5–5.0)
Lab: 44 U/L — ABNORMAL LOW (ref 25–110)
Lab: 5.1 MMOL/L — ABNORMAL LOW (ref 3.5–5.1)
Lab: 5.5 g/dL — ABNORMAL LOW (ref 6.0–8.0)
Lab: 52 mL/min — ABNORMAL LOW (ref >60)
Lab: 7 K/UL — ABNORMAL HIGH (ref 3–12)
Lab: 7.4 mg/dL — ABNORMAL LOW (ref 8.5–10.6)
Lab: 8 U/L (ref 7–56)
Lab: >60 mL/min (ref >60)

## 2018-02-18 LAB — PTT (APTT): Lab: 144 s — ABNORMAL HIGH (ref 24.0–36.5)

## 2018-02-18 LAB — MAGNESIUM: Lab: 1.9 mg/dL — ABNORMAL HIGH (ref 1.6–2.6)

## 2018-02-18 LAB — CREATININE-URINE RANDOM: Lab: 144 mg/dL

## 2018-02-18 LAB — IONIZED CALCIUM: Lab: 1 MMOL/L — ABNORMAL LOW (ref >60)

## 2018-02-18 LAB — PROTIME INR (PT): Lab: 1.2 MMOL/L — ABNORMAL LOW (ref 0.8–1.2)

## 2018-02-18 MED ORDER — SENNOSIDES-DOCUSATE SODIUM 8.6-50 MG PO TAB
1 | Freq: Two times a day (BID) | ORAL | 0 refills | Status: DC
Start: 2018-02-18 — End: 2018-02-21
  Administered 2018-02-19 – 2018-02-21 (×6): 1 via ORAL

## 2018-02-18 MED ORDER — DOCUSATE SODIUM 100 MG PO CAP
100 mg | Freq: Two times a day (BID) | ORAL | 0 refills | Status: DC
Start: 2018-02-18 — End: 2018-02-21
  Administered 2018-02-19 – 2018-02-21 (×5): 100 mg via ORAL

## 2018-02-18 MED ORDER — AMLODIPINE 5 MG PO TAB
5 mg | Freq: Every day | ORAL | 0 refills | Status: DC
Start: 2018-02-18 — End: 2018-02-19
  Administered 2018-02-19: 14:00:00 5 mg via ORAL

## 2018-02-18 MED ORDER — MAGNESIUM HYDROXIDE 2,400 MG/10 ML PO SUSP
10 mL | Freq: Every day | ORAL | 0 refills | Status: DC
Start: 2018-02-18 — End: 2018-02-21
  Administered 2018-02-19 – 2018-02-20 (×2): 10 mL via ORAL

## 2018-02-18 MED ORDER — SODIUM CHLORIDE 0.9 % IV SOLP
250 mL | INTRAVENOUS | 0 refills | Status: CP
Start: 2018-02-18 — End: ?
  Administered 2018-02-18: 22:00:00 250 mL via INTRAVENOUS

## 2018-02-18 MED ORDER — CEFAZOLIN INJ 1GM IVP
2 g | INTRAVENOUS | 0 refills | Status: DC
Start: 2018-02-18 — End: 2018-02-19
  Administered 2018-02-18 – 2018-02-19 (×3): 2 g via INTRAVENOUS

## 2018-02-18 MED ORDER — CARVEDILOL 6.25 MG PO TAB
6.25 mg | Freq: Two times a day (BID) | ORAL | 0 refills | Status: DC
Start: 2018-02-18 — End: 2018-02-21
  Administered 2018-02-19 – 2018-02-21 (×5): 6.25 mg via ORAL

## 2018-02-18 MED ORDER — SODIUM CHLORIDE 0.9 % IV SOLP
500 mL | INTRAVENOUS | 0 refills | Status: CP
Start: 2018-02-18 — End: ?
  Administered 2018-02-18: 19:00:00 500 mL via INTRAVENOUS

## 2018-02-18 MED ORDER — MAGNESIUM SULFATE IN D5W 1 GRAM/100 ML IV PGBK
1 g | Freq: Once | INTRAVENOUS | 0 refills | Status: CP
Start: 2018-02-18 — End: ?
  Administered 2018-02-18: 17:00:00 1 g via INTRAVENOUS

## 2018-02-18 MED ORDER — SODIUM CHLORIDE 0.9 % IV SOLP
INTRAVENOUS | 0 refills | Status: DC
Start: 2018-02-18 — End: 2018-02-19
  Administered 2018-02-19: 11:00:00 1000.000 mL via INTRAVENOUS

## 2018-02-18 MED ORDER — BISACODYL 10 MG RE SUPP
10 mg | Freq: Every day | RECTAL | 0 refills | Status: DC
Start: 2018-02-18 — End: 2018-02-21
  Administered 2018-02-18: 15:00:00 10 mg via RECTAL

## 2018-02-18 MED ORDER — ASPIRIN 325 MG PO TAB
325 mg | Freq: Every day | ORAL | 0 refills | Status: DC
Start: 2018-02-18 — End: 2018-02-21
  Administered 2018-02-18 – 2018-02-21 (×4): 325 mg via ORAL

## 2018-02-19 ENCOUNTER — Encounter: Admit: 2018-02-19 | Discharge: 2018-02-19 | Payer: MEDICARE

## 2018-02-19 DIAGNOSIS — I5023 Acute on chronic systolic (congestive) heart failure: Secondary | ICD-10-CM

## 2018-02-19 DIAGNOSIS — I6529 Occlusion and stenosis of unspecified carotid artery: Secondary | ICD-10-CM

## 2018-02-19 DIAGNOSIS — I429 Cardiomyopathy, unspecified: Secondary | ICD-10-CM

## 2018-02-19 DIAGNOSIS — I350 Nonrheumatic aortic (valve) stenosis: Secondary | ICD-10-CM

## 2018-02-19 DIAGNOSIS — E785 Hyperlipidemia, unspecified: Secondary | ICD-10-CM

## 2018-02-19 DIAGNOSIS — E039 Hypothyroidism, unspecified: Secondary | ICD-10-CM

## 2018-02-19 DIAGNOSIS — Z720 Tobacco use: Secondary | ICD-10-CM

## 2018-02-19 DIAGNOSIS — R54 Age-related physical debility: Secondary | ICD-10-CM

## 2018-02-19 DIAGNOSIS — M1990 Unspecified osteoarthritis, unspecified site: Secondary | ICD-10-CM

## 2018-02-19 DIAGNOSIS — I2510 Atherosclerotic heart disease of native coronary artery without angina pectoris: Secondary | ICD-10-CM

## 2018-02-19 DIAGNOSIS — I10 Essential (primary) hypertension: Secondary | ICD-10-CM

## 2018-02-19 DIAGNOSIS — Z8679 Personal history of other diseases of the circulatory system: Secondary | ICD-10-CM

## 2018-02-19 DIAGNOSIS — I7090 Unspecified atherosclerosis: Secondary | ICD-10-CM

## 2018-02-19 DIAGNOSIS — J45909 Unspecified asthma, uncomplicated: Secondary | ICD-10-CM

## 2018-02-19 DIAGNOSIS — K635 Polyp of colon: Secondary | ICD-10-CM

## 2018-02-19 LAB — CBC AND DIFF
Lab: 24 % — ABNORMAL LOW (ref >60)
Lab: 7.1 K/UL — ABNORMAL HIGH (ref 1.8–7.0)

## 2018-02-19 LAB — BASIC METABOLIC PANEL
Lab: 23 MMOL/L — ABNORMAL HIGH (ref >60)
Lab: 4 MMOL/L — ABNORMAL LOW (ref 3.5–5.1)

## 2018-02-19 LAB — POC ACTIVATED CLOTTING TIME: Lab: 158 s

## 2018-02-19 LAB — MAGNESIUM: Lab: 2.1 mg/dL (ref 1.6–2.6)

## 2018-02-19 MED ORDER — MONTELUKAST 10 MG PO TAB
10 mg | Freq: Every evening | ORAL | 0 refills | Status: DC
Start: 2018-02-19 — End: 2018-02-21
  Administered 2018-02-20 – 2018-02-21 (×2): 10 mg via ORAL

## 2018-02-19 MED ORDER — NICARDIPINE IN NACL (ISO-OS) 20 MG/200 ML IV PGBK (INFUSION)(AM)(OR)
0 refills | Status: DC
Start: 2018-02-19 — End: 2018-02-19
  Administered 2018-02-17: 22:00:00 5 mg/h via INTRAVENOUS

## 2018-02-19 MED ORDER — PANTOPRAZOLE 40 MG PO TBEC
40 mg | Freq: Every day | ORAL | 0 refills | Status: DC
Start: 2018-02-19 — End: 2018-02-21
  Administered 2018-02-20 – 2018-02-21 (×2): 40 mg via ORAL

## 2018-02-19 MED ORDER — SUGAMMADEX 100 MG/ML IV SOLN
0 refills | Status: DC
Start: 2018-02-19 — End: 2018-02-19
  Administered 2018-02-17: 22:00:00 160 mg via INTRAVENOUS

## 2018-02-19 MED ORDER — DOXAZOSIN 2 MG PO TAB
2 mg | Freq: Every evening | ORAL | 0 refills | Status: DC
Start: 2018-02-19 — End: 2018-02-21
  Administered 2018-02-20 – 2018-02-21 (×2): 2 mg via ORAL

## 2018-02-19 MED ORDER — HEPARIN, PORCINE (PF) 5,000 UNIT/0.5 ML IJ SYRG
5000 [IU] | SUBCUTANEOUS | 0 refills | Status: DC
Start: 2018-02-19 — End: 2018-02-21
  Administered 2018-02-20 – 2018-02-21 (×6): 5000 [IU] via SUBCUTANEOUS

## 2018-02-19 MED ORDER — SODIUM CHLORIDE 0.9 % IV SOLP
INTRAVENOUS | 0 refills | Status: DC
Start: 2018-02-19 — End: 2018-02-19
  Administered 2018-02-19: 18:00:00 1000.000 mL via INTRAVENOUS

## 2018-02-19 MED ORDER — CLOPIDOGREL 75 MG PO TAB
75 mg | Freq: Every day | ORAL | 0 refills | Status: DC
Start: 2018-02-19 — End: 2018-02-21
  Administered 2018-02-19 – 2018-02-21 (×3): 75 mg via ORAL

## 2018-02-19 MED ORDER — FINASTERIDE 5 MG PO TAB
5 mg | Freq: Every day | ORAL | 0 refills | Status: DC
Start: 2018-02-19 — End: 2018-02-21
  Administered 2018-02-19 – 2018-02-21 (×3): 5 mg via ORAL

## 2018-02-19 MED ORDER — ONDANSETRON HCL (PF) 4 MG/2 ML IJ SOLN
0 refills | Status: DC
Start: 2018-02-19 — End: 2018-02-19
  Administered 2018-02-17: 22:00:00 4 mg via INTRAVENOUS

## 2018-02-19 MED ORDER — HYDRALAZINE 20 MG/ML IJ SOLN
10 mg | Freq: Once | INTRAVENOUS | 0 refills | Status: CP
Start: 2018-02-19 — End: ?
  Administered 2018-02-19: 11:00:00 10 mg via INTRAVENOUS

## 2018-02-20 LAB — BASIC METABOLIC PANEL: Lab: 7 pg — ABNORMAL LOW (ref 3–12)

## 2018-02-20 LAB — CBC: Lab: 2.8 M/UL — ABNORMAL LOW (ref >60)

## 2018-02-21 ENCOUNTER — Inpatient Hospital Stay: Admit: 2018-02-17 | Discharge: 2018-02-17 | Payer: MEDICARE

## 2018-02-21 ENCOUNTER — Inpatient Hospital Stay: Admit: 2018-02-11 | Discharge: 2018-02-11 | Payer: MEDICARE

## 2018-02-21 ENCOUNTER — Encounter: Admit: 2018-02-11 | Discharge: 2018-02-11 | Payer: MEDICARE

## 2018-02-21 ENCOUNTER — Inpatient Hospital Stay: Admit: 2018-02-14 | Discharge: 2018-02-14 | Payer: MEDICARE

## 2018-02-21 ENCOUNTER — Ambulatory Visit
Admit: 2018-02-11 | Discharge: 2018-02-21 | Disposition: A | Discharge status: Rehabilitation Facility | Payer: MEDICARE | Source: Other Acute Inpatient Hospital | Admitting: Vascular Neurology

## 2018-02-21 ENCOUNTER — Ambulatory Visit: Admit: 2018-02-11 | Discharge: 2018-02-11 | Payer: MEDICARE

## 2018-02-21 ENCOUNTER — Encounter: Admit: 2018-02-21 | Discharge: 2018-02-21 | Payer: MEDICARE

## 2018-02-21 DIAGNOSIS — F17210 Nicotine dependence, cigarettes, uncomplicated: Secondary | ICD-10-CM

## 2018-02-21 DIAGNOSIS — D62 Acute posthemorrhagic anemia: Secondary | ICD-10-CM

## 2018-02-21 DIAGNOSIS — N400 Enlarged prostate without lower urinary tract symptoms: Secondary | ICD-10-CM

## 2018-02-21 DIAGNOSIS — I429 Cardiomyopathy, unspecified: Secondary | ICD-10-CM

## 2018-02-21 DIAGNOSIS — Z8679 Personal history of other diseases of the circulatory system: Secondary | ICD-10-CM

## 2018-02-21 DIAGNOSIS — I6501 Occlusion and stenosis of right vertebral artery: Secondary | ICD-10-CM

## 2018-02-21 DIAGNOSIS — R29810 Facial weakness: Secondary | ICD-10-CM

## 2018-02-21 DIAGNOSIS — I10 Essential (primary) hypertension: Secondary | ICD-10-CM

## 2018-02-21 DIAGNOSIS — I739 Peripheral vascular disease, unspecified: Secondary | ICD-10-CM

## 2018-02-21 DIAGNOSIS — E039 Hypothyroidism, unspecified: Secondary | ICD-10-CM

## 2018-02-21 DIAGNOSIS — I130 Hypertensive heart and chronic kidney disease with heart failure and stage 1 through stage 4 chronic kidney disease, or unspecified chronic kidney disease: Secondary | ICD-10-CM

## 2018-02-21 DIAGNOSIS — J449 Chronic obstructive pulmonary disease, unspecified: Secondary | ICD-10-CM

## 2018-02-21 DIAGNOSIS — E785 Hyperlipidemia, unspecified: Secondary | ICD-10-CM

## 2018-02-21 DIAGNOSIS — N183 Chronic kidney disease, stage 3 (moderate): Secondary | ICD-10-CM

## 2018-02-21 DIAGNOSIS — R29703 NIHSS score 3: Secondary | ICD-10-CM

## 2018-02-21 DIAGNOSIS — R1313 Dysphagia, pharyngeal phase: Secondary | ICD-10-CM

## 2018-02-21 DIAGNOSIS — I5032 Chronic diastolic (congestive) heart failure: Secondary | ICD-10-CM

## 2018-02-21 DIAGNOSIS — I6522 Occlusion and stenosis of left carotid artery: Secondary | ICD-10-CM

## 2018-02-21 DIAGNOSIS — K219 Gastro-esophageal reflux disease without esophagitis: Secondary | ICD-10-CM

## 2018-02-21 DIAGNOSIS — N179 Acute kidney failure, unspecified: Secondary | ICD-10-CM

## 2018-02-21 DIAGNOSIS — I7090 Unspecified atherosclerosis: Secondary | ICD-10-CM

## 2018-02-21 DIAGNOSIS — I2510 Atherosclerotic heart disease of native coronary artery without angina pectoris: Secondary | ICD-10-CM

## 2018-02-21 DIAGNOSIS — Z8673 Personal history of transient ischemic attack (TIA), and cerebral infarction without residual deficits: Secondary | ICD-10-CM

## 2018-02-21 DIAGNOSIS — I6381 Other cerebral infarction due to occlusion or stenosis of small artery: Secondary | ICD-10-CM

## 2018-02-21 DIAGNOSIS — R471 Dysarthria and anarthria: Secondary | ICD-10-CM

## 2018-02-21 DIAGNOSIS — Z720 Tobacco use: ICD-10-CM

## 2018-02-21 DIAGNOSIS — Z953 Presence of xenogenic heart valve: Secondary | ICD-10-CM

## 2018-02-21 DIAGNOSIS — I6529 Occlusion and stenosis of unspecified carotid artery: Secondary | ICD-10-CM

## 2018-02-21 DIAGNOSIS — S40011A Contusion of right shoulder, initial encounter: Secondary | ICD-10-CM

## 2018-02-21 DIAGNOSIS — R54 Age-related physical debility: Secondary | ICD-10-CM

## 2018-02-21 DIAGNOSIS — I5023 Acute on chronic systolic (congestive) heart failure: Secondary | ICD-10-CM

## 2018-02-21 DIAGNOSIS — K635 Polyp of colon: Secondary | ICD-10-CM

## 2018-02-21 DIAGNOSIS — I69351 Hemiplegia and hemiparesis following cerebral infarction affecting right dominant side: Principal | ICD-10-CM

## 2018-02-21 DIAGNOSIS — I639 Cerebral infarction, unspecified: ICD-10-CM

## 2018-02-21 DIAGNOSIS — I6523 Occlusion and stenosis of bilateral carotid arteries: ICD-10-CM

## 2018-02-21 DIAGNOSIS — M1990 Unspecified osteoarthritis, unspecified site: Secondary | ICD-10-CM

## 2018-02-21 DIAGNOSIS — I350 Nonrheumatic aortic (valve) stenosis: Secondary | ICD-10-CM

## 2018-02-21 DIAGNOSIS — J45909 Unspecified asthma, uncomplicated: Secondary | ICD-10-CM

## 2018-02-21 LAB — CBC: Lab: 3.6 M/UL — ABNORMAL LOW (ref 4.4–5.5)

## 2018-02-21 MED ORDER — LISINOPRIL 10 MG PO TAB
10 mg | ORAL_TABLET | Freq: Every day | ORAL | 0 refills | Status: SS
Start: 2018-02-21 — End: 2018-03-10

## 2018-02-21 MED ORDER — DOXAZOSIN 2 MG PO TAB
2 mg | Freq: Every evening | ORAL | 0 refills | Status: SS
Start: 2018-02-21 — End: 2018-03-10

## 2018-02-21 MED ORDER — ASPIRIN 325 MG PO TAB
325 mg | ORAL_TABLET | Freq: Every day | ORAL | 0 refills | 30.00000 days | Status: AC
Start: 2018-02-21 — End: 2018-03-10

## 2018-02-21 MED ORDER — ALUMINUM-MAGNESIUM HYDROXIDE 200-200 MG/5 ML PO SUSP
30 mL | ORAL | 0 refills | Status: DC | PRN
Start: 2018-02-21 — End: 2018-03-10

## 2018-02-21 MED ORDER — MONTELUKAST 10 MG PO TAB
10 mg | ORAL_TABLET | Freq: Every evening | ORAL | 0 refills | Status: SS
Start: 2018-02-21 — End: 2018-03-10

## 2018-02-21 MED ORDER — LEVOTHYROXINE 112 MCG PO TAB
112 ug | Freq: Every day | ORAL | 0 refills | Status: DC
Start: 2018-02-21 — End: 2018-03-10
  Administered 2018-02-22 – 2018-03-10 (×17): 112 ug via ORAL

## 2018-02-21 MED ORDER — MAGNESIUM HYDROXIDE 2,400 MG/10 ML PO SUSP
10 mL | ORAL | 0 refills | Status: DC | PRN
Start: 2018-02-21 — End: 2018-03-10
  Administered 2018-03-03: 15:00:00 10 mL via ORAL

## 2018-02-21 MED ORDER — CLOPIDOGREL 75 MG PO TAB
75 mg | ORAL_TABLET | Freq: Every day | ORAL | 0 refills | Status: SS
Start: 2018-02-21 — End: 2018-03-10

## 2018-02-21 MED ORDER — PANTOPRAZOLE 40 MG PO TBEC
40 mg | Freq: Every day | ORAL | 0 refills | Status: DC
Start: 2018-02-21 — End: 2018-03-10
  Administered 2018-02-22 – 2018-03-10 (×17): 40 mg via ORAL

## 2018-02-21 MED ORDER — ACETAMINOPHEN 325 MG PO TAB
650 mg | ORAL | 0 refills | Status: AC | PRN
Start: 2018-02-21 — End: 2018-03-10

## 2018-02-21 MED ORDER — FLUTICASONE PROPIONATE 110 MCG/ACTUATION IN HFAA
2 | Freq: Two times a day (BID) | RESPIRATORY_TRACT | 0 refills | Status: CN
Start: 2018-02-21 — End: ?

## 2018-02-21 MED ORDER — ALBUTEROL SULFATE 90 MCG/ACTUATION IN HFAA
2 | RESPIRATORY_TRACT | 0 refills | Status: CN | PRN
Start: 2018-02-21 — End: ?

## 2018-02-21 MED ORDER — DUTASTERIDE 0.5 MG PO CAP
0.5 mg | Freq: Every day | ORAL | 0 refills | Status: DC
Start: 2018-02-21 — End: 2018-03-10
  Administered 2018-02-22 – 2018-03-10 (×17): 0.5 mg via ORAL

## 2018-02-21 MED ORDER — HEPARIN, PORCINE (PF) 5,000 UNIT/0.5 ML IJ SYRG
5000 [IU] | SUBCUTANEOUS | 0 refills | Status: DC
Start: 2018-02-21 — End: 2018-03-10
  Administered 2018-02-22 – 2018-03-10 (×50): 5000 [IU] via SUBCUTANEOUS

## 2018-02-21 MED ORDER — ASPIRIN 325 MG PO TAB
325 mg | Freq: Every day | ORAL | 0 refills | Status: DC
Start: 2018-02-21 — End: 2018-03-10
  Administered 2018-02-22 – 2018-03-10 (×17): 325 mg via ORAL

## 2018-02-21 MED ORDER — HEPARIN, PORCINE (PF) 5,000 UNIT/0.5 ML IJ SYRG
5000 [IU] | SUBCUTANEOUS | 0 refills | Status: AC
Start: 2018-02-21 — End: 2018-03-10

## 2018-02-21 MED ORDER — MONTELUKAST 10 MG PO TAB
10 mg | Freq: Every evening | ORAL | 0 refills | Status: DC
Start: 2018-02-21 — End: 2018-03-10
  Administered 2018-02-22 – 2018-03-10 (×17): 10 mg via ORAL

## 2018-02-21 MED ORDER — CARVEDILOL 12.5 MG PO TAB
12.5 mg | Freq: Two times a day (BID) | ORAL | 0 refills | Status: CN
Start: 2018-02-21 — End: ?

## 2018-02-21 MED ORDER — FINASTERIDE 5 MG PO TAB
5 mg | Freq: Every day | ORAL | 0 refills | Status: DC
Start: 2018-02-21 — End: 2018-03-10
  Administered 2018-02-22 – 2018-03-10 (×17): 5 mg via ORAL

## 2018-02-21 MED ORDER — ASPIRIN 325 MG PO TAB
325 mg | Freq: Every day | ORAL | 0 refills | Status: CN
Start: 2018-02-21 — End: ?

## 2018-02-21 MED ORDER — ONDANSETRON HCL 4 MG PO TAB
4 mg | ORAL | 0 refills | Status: DC | PRN
Start: 2018-02-21 — End: 2018-03-10

## 2018-02-21 MED ORDER — MONTELUKAST 10 MG PO TAB
10 mg | Freq: Every evening | ORAL | 0 refills | Status: CN
Start: 2018-02-21 — End: ?

## 2018-02-21 MED ORDER — LISINOPRIL 10 MG PO TAB
10 mg | Freq: Every day | ORAL | 0 refills | Status: CN
Start: 2018-02-21 — End: ?

## 2018-02-21 MED ORDER — ACETAMINOPHEN 325 MG PO TAB
650 mg | ORAL | 0 refills | Status: DC | PRN
Start: 2018-02-21 — End: 2018-03-10
  Administered 2018-02-24 – 2018-02-25 (×2): 650 mg via ORAL

## 2018-02-21 MED ORDER — ATORVASTATIN 40 MG PO TAB
40 mg | Freq: Every day | ORAL | 0 refills | Status: DC
Start: 2018-02-21 — End: 2018-03-10
  Administered 2018-02-22 – 2018-03-10 (×16): 40 mg via ORAL

## 2018-02-21 MED ORDER — FINASTERIDE 5 MG PO TAB
5 mg | Freq: Every day | ORAL | 0 refills | Status: CN
Start: 2018-02-21 — End: ?

## 2018-02-21 MED ORDER — LEVOTHYROXINE 112 MCG PO TAB
112 ug | Freq: Every day | ORAL | 0 refills | Status: CN
Start: 2018-02-21 — End: ?

## 2018-02-21 MED ORDER — SENNOSIDES-DOCUSATE SODIUM 8.6-50 MG PO TAB
1 | Freq: Two times a day (BID) | ORAL | 0 refills | Status: AC
Start: 2018-02-21 — End: 2018-09-11

## 2018-02-21 MED ORDER — GUAIFENESIN 600 MG PO TA12
600 mg | Freq: Two times a day (BID) | ORAL | 0 refills | Status: DC
Start: 2018-02-21 — End: 2018-02-22

## 2018-02-21 MED ORDER — PANTOPRAZOLE 40 MG PO TBEC
40 mg | Freq: Every day | ORAL | 0 refills | Status: CN
Start: 2018-02-21 — End: ?

## 2018-02-21 MED ORDER — SENNOSIDES 8.6 MG PO TAB
2 | Freq: Every evening | ORAL | 0 refills | Status: DC
Start: 2018-02-21 — End: 2018-03-10
  Administered 2018-02-22 – 2018-03-08 (×10): 2 via ORAL

## 2018-02-21 MED ORDER — CLOPIDOGREL 75 MG PO TAB
75 mg | Freq: Every day | ORAL | 0 refills | Status: CN
Start: 2018-02-21 — End: ?

## 2018-02-21 MED ORDER — HEPARIN, PORCINE (PF) 5,000 UNIT/0.5 ML IJ SYRG
5000 [IU] | SUBCUTANEOUS | 0 refills | Status: CN
Start: 2018-02-21 — End: ?

## 2018-02-21 MED ORDER — DUTASTERIDE 0.5 MG PO CAP
0.5 mg | Freq: Every day | ORAL | 0 refills | Status: CN
Start: 2018-02-21 — End: ?

## 2018-02-21 MED ORDER — ALBUTEROL SULFATE 90 MCG/ACTUATION IN HFAA
2 | RESPIRATORY_TRACT | 0 refills | Status: DC | PRN
Start: 2018-02-21 — End: 2018-03-10

## 2018-02-21 MED ORDER — BISACODYL 10 MG RE SUPP
10 mg | Freq: Every day | RECTAL | 0 refills | Status: DC | PRN
Start: 2018-02-21 — End: 2018-03-10

## 2018-02-21 MED ORDER — CARVEDILOL 12.5 MG PO TAB
12.5 mg | Freq: Two times a day (BID) | ORAL | 0 refills | Status: DC
Start: 2018-02-21 — End: 2018-03-10
  Administered 2018-02-22 – 2018-03-10 (×32): 12.5 mg via ORAL

## 2018-02-21 MED ORDER — CLOPIDOGREL 75 MG PO TAB
75 mg | Freq: Every day | ORAL | 0 refills | Status: DC
Start: 2018-02-21 — End: 2018-03-10
  Administered 2018-02-23 – 2018-03-10 (×15): 75 mg via ORAL

## 2018-02-21 MED ORDER — PANTOPRAZOLE 40 MG PO TBEC
40 mg | ORAL_TABLET | Freq: Every day | ORAL | 0 refills | 90.00000 days | Status: AC
Start: 2018-02-21 — End: 2018-03-10

## 2018-02-21 MED ORDER — DOXAZOSIN 2 MG PO TAB
2 mg | Freq: Every evening | ORAL | 0 refills | Status: DC
Start: 2018-02-21 — End: 2018-03-10
  Administered 2018-02-22 – 2018-03-10 (×17): 2 mg via ORAL

## 2018-02-21 MED ORDER — LISINOPRIL 10 MG PO TAB
10 mg | Freq: Every day | ORAL | 0 refills | Status: DC
Start: 2018-02-21 — End: 2018-02-21
  Administered 2018-02-21: 15:00:00 10 mg via ORAL

## 2018-02-21 MED ORDER — ATORVASTATIN 40 MG PO TAB
40 mg | ORAL_TABLET | Freq: Every day | ORAL | 0 refills | Status: SS
Start: 2018-02-21 — End: 2018-03-10

## 2018-02-21 MED ORDER — ATORVASTATIN 40 MG PO TAB
40 mg | Freq: Every day | ORAL | 0 refills | Status: CN
Start: 2018-02-21 — End: ?

## 2018-02-21 MED ORDER — DOCUSATE SODIUM 50 MG/5 ML PO LIQD
100 mg | Freq: Two times a day (BID) | ORAL | 0 refills | Status: DC
Start: 2018-02-21 — End: 2018-03-10
  Administered 2018-02-22 – 2018-03-10 (×19): 100 mg via ORAL

## 2018-02-21 MED ORDER — FLUTICASONE PROPIONATE 110 MCG/ACTUATION IN HFAA
2 | Freq: Two times a day (BID) | RESPIRATORY_TRACT | 0 refills | Status: DC
Start: 2018-02-21 — End: 2018-03-10
  Administered 2018-02-22 – 2018-02-23 (×2): 2 via RESPIRATORY_TRACT

## 2018-02-21 MED ORDER — LISINOPRIL 10 MG PO TAB
10 mg | Freq: Every day | ORAL | 0 refills | Status: DC
Start: 2018-02-21 — End: 2018-03-05
  Administered 2018-02-22 – 2018-03-05 (×12): 10 mg via ORAL

## 2018-02-21 MED ORDER — CARVEDILOL 12.5 MG PO TAB
12.5 mg | Freq: Two times a day (BID) | ORAL | 0 refills | Status: DC
Start: 2018-02-21 — End: 2018-02-21
  Administered 2018-02-21: 15:00:00 12.5 mg via ORAL

## 2018-02-21 MED ORDER — DOXAZOSIN 2 MG PO TAB
2 mg | Freq: Every evening | ORAL | 0 refills | Status: CN
Start: 2018-02-21 — End: ?

## 2018-02-21 MED ORDER — CARVEDILOL 25 MG PO TAB
25 mg | Freq: Two times a day (BID) | ORAL | 0 refills | 90.00000 days | Status: AC
Start: 2018-02-21 — End: 2018-03-10

## 2018-02-21 MED ORDER — LEVOTHYROXINE 112 MCG PO TAB
112 ug | ORAL_TABLET | Freq: Every day | ORAL | 0 refills | 30.00000 days | Status: AC
Start: 2018-02-21 — End: 2019-07-02

## 2018-02-21 MED ORDER — DOCUSATE SODIUM 100 MG PO CAP
100 mg | Freq: Two times a day (BID) | ORAL | 0 refills | Status: DC
Start: 2018-02-21 — End: 2018-02-22

## 2018-02-22 LAB — CBC CELLULAR THERAPEUTICS: Lab: 242 K/UL — ABNORMAL LOW (ref >60)

## 2018-02-22 LAB — BASIC METABOLIC PANEL CELLULAR THERAPEUTICS: Lab: 4.6 MMOL/L — ABNORMAL LOW (ref 3.5–5.1)

## 2018-02-23 LAB — BASIC METABOLIC PANEL CELLULAR THERAPEUTICS: Lab: 135 MMOL/L — ABNORMAL LOW (ref 137–147)

## 2018-02-23 LAB — CBC CELLULAR THERAPEUTICS
Lab: 32 g/dL — ABNORMAL HIGH (ref 32.0–36.0)
Lab: 9.4 g/dL — ABNORMAL LOW (ref 13.5–16.5)

## 2018-02-24 LAB — BASIC METABOLIC PANEL CELLULAR THERAPEUTICS
Lab: 22 MMOL/L — ABNORMAL LOW (ref 21–30)
Lab: 24 mg/dL — ABNORMAL HIGH (ref 7–25)
Lab: 4.7 MMOL/L — ABNORMAL LOW (ref >60)

## 2018-02-24 LAB — CBC CELLULAR THERAPEUTICS
Lab: 3.6 M/UL — ABNORMAL LOW (ref >60)
Lab: 32 g/dL — ABNORMAL LOW (ref 32.0–36.0)

## 2018-02-24 MED ORDER — SODIUM CHLORIDE 0.9 % FLUSH
3-5 mL | Freq: Three times a day (TID) | 0 refills | Status: DC
Start: 2018-02-24 — End: 2018-03-02

## 2018-02-25 LAB — BASIC METABOLIC PANEL CELLULAR THERAPEUTICS: Lab: 104 MMOL/L — ABNORMAL LOW (ref >60)

## 2018-02-26 LAB — CBC CELLULAR THERAPEUTICS: Lab: 84 FL — ABNORMAL LOW (ref >60)

## 2018-02-26 MED ORDER — SODIUM CHLORIDE 0.9 % IV SOLP
1000 mL | INTRAVENOUS | 0 refills | Status: AC
Start: 2018-02-26 — End: ?
  Administered 2018-02-26: 23:00:00 1000 mL via INTRAVENOUS

## 2018-02-28 LAB — CBC CELLULAR THERAPEUTICS
Lab: 9.1 K/UL — ABNORMAL LOW (ref 4.5–11.0)
Lab: 9.6 g/dL — ABNORMAL LOW (ref 13.5–16.5)

## 2018-02-28 LAB — BASIC METABOLIC PANEL CELLULAR THERAPEUTICS: Lab: 24 MMOL/L — ABNORMAL LOW (ref 21–30)

## 2018-02-28 MED ORDER — SODIUM CHLORIDE 0.9 % IV SOLP
1000 mL | INTRAVENOUS | 0 refills | Status: AC
Start: 2018-02-28 — End: ?
  Administered 2018-02-28 – 2018-03-01 (×2): 1000 mL via INTRAVENOUS

## 2018-03-01 LAB — BASIC METABOLIC PANEL: Lab: 140 MMOL/L — ABNORMAL LOW (ref >60)

## 2018-03-02 LAB — BASIC METABOLIC PANEL
Lab: 105 MMOL/L — ABNORMAL HIGH (ref >60)
Lab: 137 MMOL/L — ABNORMAL LOW (ref >60)

## 2018-03-02 MED ORDER — SODIUM CHLORIDE 0.9 % IV SOLP
1000 mL | INTRAVENOUS | 0 refills | Status: AC
Start: 2018-03-02 — End: ?
  Administered 2018-03-03: 03:00:00 1000 mL via INTRAVENOUS

## 2018-03-03 LAB — BASIC METABOLIC PANEL CELLULAR THERAPEUTICS
Lab: 138 MMOL/L — ABNORMAL HIGH (ref 137–147)
Lab: 4.4 MMOL/L — ABNORMAL HIGH (ref 3.5–5.1)

## 2018-03-03 MED ORDER — SODIUM CHLORIDE 0.9 % IV SOLP
1000 mL | INTRAVENOUS | 0 refills | Status: AC
Start: 2018-03-03 — End: ?
  Administered 2018-03-03 – 2018-03-04 (×2): 1000 mL via INTRAVENOUS

## 2018-03-05 LAB — BASIC METABOLIC PANEL CELLULAR THERAPEUTICS
Lab: 10 g/dL (ref 3–12)
Lab: 105 MMOL/L — ABNORMAL HIGH (ref 98–110)
Lab: 138 MMOL/L — ABNORMAL LOW (ref 137–147)
Lab: 23 MMOL/L — ABNORMAL LOW (ref 21–30)
Lab: 4.1 MMOL/L — ABNORMAL LOW (ref 3.5–5.1)

## 2018-03-05 LAB — CBC CELLULAR THERAPEUTICS
Lab: 11 K/UL — ABNORMAL HIGH (ref >60)
Lab: 3.5 M/UL — ABNORMAL LOW (ref >60)

## 2018-03-05 MED ORDER — SODIUM CHLORIDE 0.9 % IV SOLP
1000 mL | INTRAVENOUS | 0 refills | Status: DC
Start: 2018-03-05 — End: 2018-03-07
  Administered 2018-03-06 – 2018-03-07 (×4): 1000 mL via INTRAVENOUS

## 2018-03-05 MED ORDER — SODIUM CHLORIDE 0.9 % FLUSH
3-5 mL | Freq: Three times a day (TID) | 0 refills | Status: DC
Start: 2018-03-05 — End: 2018-03-10

## 2018-03-06 LAB — BASIC METABOLIC PANEL: Lab: 138 MMOL/L — ABNORMAL HIGH (ref 137–147)

## 2018-03-06 LAB — CREATININE-URINE RANDOM: Lab: 363 mg/dL

## 2018-03-06 LAB — URINALYSIS MICROSCOPIC REFLEX TO CULTURE

## 2018-03-06 LAB — URINALYSIS DIPSTICK REFLEX TO CULTURE
Lab: NEGATIVE
Lab: NEGATIVE
Lab: NEGATIVE
Lab: POSITIVE — AB
Lab: NEGATIVE

## 2018-03-06 LAB — CBC AND DIFF: Lab: 8.3 10*3/uL — ABNORMAL LOW (ref 4.5–11.0)

## 2018-03-06 LAB — SODIUM-URINE RANDOM: Lab: 100 MMOL/L

## 2018-03-06 LAB — UREA NITROGEN-URINE RANDOM: Lab: 784 mg/dL

## 2018-03-07 LAB — INFLUENZA A/B AND RSV PCR
Lab: NEGATIVE % (ref 95–99)
Lab: NEGATIVE MMOL/L (ref 21–28)
Lab: POSITIVE MMOL/L — AB (ref 24–44)

## 2018-03-07 LAB — CBC CELLULAR THERAPEUTICS: Lab: 6.9 K/UL — ABNORMAL HIGH (ref >60)

## 2018-03-07 LAB — BASIC METABOLIC PANEL CELLULAR THERAPEUTICS: Lab: 137 MMOL/L — ABNORMAL LOW (ref 137–147)

## 2018-03-07 MED ORDER — OSELTAMIVIR 6 MG/ML PO SUSR
30 mg | Freq: Two times a day (BID) | ORAL | 0 refills | Status: CP
Start: 2018-03-07 — End: ?
  Administered 2018-03-07 – 2018-03-09 (×5): 30 mg via ORAL

## 2018-03-07 MED ORDER — OSELTAMIVIR 75 MG PO CAP
75 mg | Freq: Two times a day (BID) | ORAL | 0 refills | Status: CN
Start: 2018-03-07 — End: ?

## 2018-03-08 MED ORDER — DEXTROMETHORPHAN-GUAIFENESIN 10-100 MG/5 ML PO SYRP
10 mL | ORAL | 0 refills | Status: DC | PRN
Start: 2018-03-08 — End: 2018-03-10

## 2018-03-09 LAB — BASIC METABOLIC PANEL
Lab: 105 MMOL/L (ref 98–110)
Lab: 140 MMOL/L (ref 137–147)
Lab: 3.8 MMOL/L (ref 3.5–5.1)

## 2018-03-10 ENCOUNTER — Inpatient Hospital Stay: Admit: 2018-02-21 | Discharge: 2018-03-10 | Payer: MEDICARE

## 2018-03-10 ENCOUNTER — Inpatient Hospital Stay: Admit: 2018-02-22 | Discharge: 2018-02-22 | Payer: MEDICARE

## 2018-03-10 DIAGNOSIS — I739 Peripheral vascular disease, unspecified: Secondary | ICD-10-CM

## 2018-03-10 DIAGNOSIS — I2510 Atherosclerotic heart disease of native coronary artery without angina pectoris: ICD-10-CM

## 2018-03-10 DIAGNOSIS — D62 Acute posthemorrhagic anemia: ICD-10-CM

## 2018-03-10 DIAGNOSIS — F17210 Nicotine dependence, cigarettes, uncomplicated: ICD-10-CM

## 2018-03-10 DIAGNOSIS — I130 Hypertensive heart and chronic kidney disease with heart failure and stage 1 through stage 4 chronic kidney disease, or unspecified chronic kidney disease: Secondary | ICD-10-CM

## 2018-03-10 DIAGNOSIS — J101 Influenza due to other identified influenza virus with other respiratory manifestations: ICD-10-CM

## 2018-03-10 DIAGNOSIS — N400 Enlarged prostate without lower urinary tract symptoms: Secondary | ICD-10-CM

## 2018-03-10 DIAGNOSIS — I429 Cardiomyopathy, unspecified: ICD-10-CM

## 2018-03-10 DIAGNOSIS — E039 Hypothyroidism, unspecified: Secondary | ICD-10-CM

## 2018-03-10 DIAGNOSIS — E785 Hyperlipidemia, unspecified: Secondary | ICD-10-CM

## 2018-03-10 DIAGNOSIS — I5043 Acute on chronic combined systolic (congestive) and diastolic (congestive) heart failure: ICD-10-CM

## 2018-03-10 DIAGNOSIS — I69319 Unspecified symptoms and signs involving cognitive functions following cerebral infarction: ICD-10-CM

## 2018-03-10 DIAGNOSIS — Z953 Presence of xenogenic heart valve: Secondary | ICD-10-CM

## 2018-03-10 DIAGNOSIS — I69322 Dysarthria following cerebral infarction: Secondary | ICD-10-CM

## 2018-03-10 DIAGNOSIS — K219 Gastro-esophageal reflux disease without esophagitis: Secondary | ICD-10-CM

## 2018-03-10 DIAGNOSIS — E871 Hypo-osmolality and hyponatremia: Secondary | ICD-10-CM

## 2018-03-10 DIAGNOSIS — I69351 Hemiplegia and hemiparesis following cerebral infarction affecting right dominant side: Secondary | ICD-10-CM

## 2018-03-10 DIAGNOSIS — N183 Chronic kidney disease, stage 3 (moderate): Secondary | ICD-10-CM

## 2018-03-10 DIAGNOSIS — J45909 Unspecified asthma, uncomplicated: ICD-10-CM

## 2018-03-10 DIAGNOSIS — I69320 Aphasia following cerebral infarction: Secondary | ICD-10-CM

## 2018-03-10 DIAGNOSIS — I69392 Facial weakness following cerebral infarction: Secondary | ICD-10-CM

## 2018-03-10 LAB — CBC CELLULAR THERAPEUTICS: Lab: 7.2 10*3/uL — ABNORMAL LOW (ref >60)

## 2018-03-10 LAB — BASIC METABOLIC PANEL CELLULAR THERAPEUTICS: Lab: 141 MMOL/L — ABNORMAL LOW (ref >60)

## 2018-03-10 MED ORDER — LISINOPRIL 5 MG PO TAB
5 mg | ORAL_TABLET | Freq: Every day | ORAL | 0 refills | Status: AC
Start: 2018-03-10 — End: 2018-03-28

## 2018-03-10 MED ORDER — LISINOPRIL 10 MG PO TAB
5 mg | ORAL_TABLET | Freq: Every day | ORAL | 0 refills | Status: AC
Start: 2018-03-10 — End: 2018-03-10

## 2018-03-10 MED ORDER — LISINOPRIL 5 MG PO TAB
5 mg | Freq: Once | ORAL | 0 refills | Status: CP
Start: 2018-03-10 — End: ?
  Administered 2018-03-10: 16:00:00 5 mg via ORAL

## 2018-03-10 MED ORDER — CARVEDILOL 12.5 MG PO TAB
12.5 mg | ORAL_TABLET | Freq: Two times a day (BID) | ORAL | 1 refills | 90.00000 days | Status: AC
Start: 2018-03-10 — End: 2019-06-05

## 2018-03-10 MED ORDER — MONTELUKAST 10 MG PO TAB
10 mg | ORAL_TABLET | Freq: Every evening | ORAL | 1 refills | 90.00000 days | Status: AC
Start: 2018-03-10 — End: ?

## 2018-03-10 MED ORDER — PANTOPRAZOLE 40 MG PO TBEC
40 mg | ORAL_TABLET | Freq: Every day | ORAL | 1 refills | 90.00000 days | Status: AC
Start: 2018-03-10 — End: ?

## 2018-03-10 MED ORDER — ATORVASTATIN 40 MG PO TAB
40 mg | ORAL_TABLET | Freq: Every day | ORAL | 1 refills | Status: AC
Start: 2018-03-10 — End: ?

## 2018-03-10 MED ORDER — DOXAZOSIN 2 MG PO TAB
2 mg | ORAL_TABLET | Freq: Every evening | ORAL | 1 refills | 30.00000 days | Status: AC
Start: 2018-03-10 — End: 2019-07-02

## 2018-03-10 MED ORDER — ASPIRIN 325 MG PO TAB
325 mg | ORAL_TABLET | Freq: Every day | ORAL | 0 refills | 30.00000 days | Status: AC
Start: 2018-03-10 — End: 2019-07-02

## 2018-03-10 MED ORDER — DEXTROMETHORPHAN-GUAIFENESIN 10-100 MG/5 ML PO SYRP
10 mL | ORAL | 0 refills | Status: AC | PRN
Start: 2018-03-10 — End: 2018-09-11

## 2018-03-10 MED ORDER — CLOPIDOGREL 75 MG PO TAB
75 mg | ORAL_TABLET | Freq: Every day | ORAL | 1 refills | 90.00000 days | Status: AC
Start: 2018-03-10 — End: ?

## 2018-03-19 ENCOUNTER — Ambulatory Visit: Admit: 2018-03-19 | Discharge: 2018-03-20 | Payer: MEDICARE

## 2018-03-19 ENCOUNTER — Encounter: Admit: 2018-03-19 | Discharge: 2018-03-19 | Payer: MEDICARE

## 2018-03-19 DIAGNOSIS — I10 Essential (primary) hypertension: ICD-10-CM

## 2018-03-19 DIAGNOSIS — I2510 Atherosclerotic heart disease of native coronary artery without angina pectoris: ICD-10-CM

## 2018-03-19 DIAGNOSIS — R54 Age-related physical debility: ICD-10-CM

## 2018-03-19 DIAGNOSIS — I7090 Unspecified atherosclerosis: ICD-10-CM

## 2018-03-19 DIAGNOSIS — I5023 Acute on chronic systolic (congestive) heart failure: ICD-10-CM

## 2018-03-19 DIAGNOSIS — I429 Cardiomyopathy, unspecified: ICD-10-CM

## 2018-03-19 DIAGNOSIS — I6529 Occlusion and stenosis of unspecified carotid artery: ICD-10-CM

## 2018-03-19 DIAGNOSIS — Z8679 Personal history of other diseases of the circulatory system: ICD-10-CM

## 2018-03-19 DIAGNOSIS — E785 Hyperlipidemia, unspecified: ICD-10-CM

## 2018-03-19 DIAGNOSIS — Z720 Tobacco use: ICD-10-CM

## 2018-03-19 DIAGNOSIS — J45909 Unspecified asthma, uncomplicated: ICD-10-CM

## 2018-03-19 DIAGNOSIS — I350 Nonrheumatic aortic (valve) stenosis: ICD-10-CM

## 2018-03-19 DIAGNOSIS — E039 Hypothyroidism, unspecified: ICD-10-CM

## 2018-03-19 DIAGNOSIS — M1990 Unspecified osteoarthritis, unspecified site: ICD-10-CM

## 2018-03-19 DIAGNOSIS — K635 Polyp of colon: ICD-10-CM

## 2018-03-20 DIAGNOSIS — I6523 Occlusion and stenosis of bilateral carotid arteries: Principal | ICD-10-CM

## 2018-03-28 ENCOUNTER — Ambulatory Visit: Admit: 2018-03-28 | Discharge: 2018-03-29 | Payer: MEDICARE

## 2018-03-28 DIAGNOSIS — R54 Age-related physical debility: Secondary | ICD-10-CM

## 2018-03-28 DIAGNOSIS — R471 Dysarthria and anarthria: Secondary | ICD-10-CM

## 2018-03-28 NOTE — Progress Notes
with physical therapy.  He is noting still some difficulty with coming up with words.  His dysarthria seems to be improving.  He is working with speech therapy 5 days a week and is doing exercises with his daughter.  Daughter has some concern with weight loss.  She thinks it is because of stroke, hospitalization, and recent illness.  She is trying to supplement as much protein as possible.         Review of Systems    Current Outpatient Medications:   ???  albuterol (VENTOLIN HFA) 90 mcg/actuation inhaler, Inhale two puffs by mouth into the lungs every 6 hours as needed for Wheezing or Shortness of Breath. Shake well before use., Disp: 2 Inhaler, Rfl: 1  ???  aspirin 325 mg tablet, Take one tablet by mouth daily. Take with food., Disp: 90 tablet, Rfl:   ???  atorvastatin (LIPITOR) 40 mg tablet, Take one tablet by mouth daily., Disp: 30 tablet, Rfl: 1  ???  carvediloL (COREG) 12.5 mg tablet, Take one tablet by mouth twice daily. Take with food., Disp: 60 tablet, Rfl: 1  ???  clopiDOGrel (PLAVIX) 75 mg tablet, Take one tablet by mouth daily., Disp: 30 tablet, Rfl: 1  ???  dextromethorphan/guaiFENesin (ROBITUSSIN-DM) 10/100 mg/5 mL syrp oral syrup, Take 10 mL by mouth every 4 hours as needed., Disp: , Rfl:   ???  doxazosin (CARDURA) 2 mg tablet, Take one tablet by mouth at bedtime daily., Disp: 30 tablet, Rfl: 1  ???  dutasteride (AVODART) 0.5 mg PO capsule, Take 0.5 mg by mouth Daily., Disp: , Rfl:   ???  finasteride (PROSCAR) 5 mg tablet, Take 5 mg by mouth daily., Disp: , Rfl:   ???  fluticasone propionate (FLOVENT HFA) 110 mcg/actuation inhaler, Inhale 1 puff by mouth into the lungs twice daily., Disp: , Rfl:   ???  food supplemt, lactose-reduced (ENSURE COMPLETE) 0.05-1.5 gram-kcal/mL liqd, Take 1 Can by mouth twice daily with meals., Disp: , Rfl:   ???  levothyroxine (SYNTHROID) 112 mcg tablet, Take one tablet by mouth daily 30 minutes before breakfast., Disp: 90 tablet, Rfl: ???  montelukast (SINGULAIR) 10 mg tablet, Take one tablet by mouth at bedtime daily., Disp: 30 tablet, Rfl: 1  ???  MULTIVITAMIN PO, Take  by mouth., Disp: , Rfl:   ???  pantoprazole DR (PROTONIX) 40 mg tablet, Take one tablet by mouth daily., Disp: 30 tablet, Rfl: 1  ???  predniSONE (DELTASONE) 10 mg tablet, Take 10 mg by mouth. Tapering to off., Disp: , Rfl:   ???  senna/docusate (SENOKOT-S) 8.6/50 mg tablet, Take one tablet by mouth twice daily., Disp: , Rfl:   No Known Allergies  Physical Exam  Vitals:    03/28/18 1220   BP: (!) 143/75   BP Source: Arm, Left Upper   Patient Position: Sitting   Pulse: 82   Temp: 36.9 ???C (98.5 ???F)   TempSrc: Oral   SpO2: 93%           There is no height or weight on file to calculate BMI.  Physical Exam ???  Gen: comfortable, NAD ???  HEENT: NCAT, anicteric sclera ???  Card: Extremities warm, well-perfused, cap refill <2sec ???  Pulm: no distress, not cyanotic ???  Abd: soft, non-distended ???  Skin: Skin is warm and dry.  Psychiatric: normal mood and affect. Behavior is normal.     Neuro ???  CNII-XII grossly normal , mild right-sided facial droop of the   5/5 strength in BLE  Sensation: intact sensation to light touch of BLE  Reflexes: symmetric reflexes of patella and Achille's  Neg Ankle clonus bilaterally ???  Negative Hoffman's bilaterally ???  Mental status appropriate, cognition grossly intact.  Speech slightly dysarthric, but was able to repeat.  Patient was unable to name.  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    MSK: ???  Inspection: grossly symmetric, no obvious deformity, no erythema ???           IMPRESSION:  1. 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 Frailty    3. Impaired mobility    4. Dysarthria    5. Impaired mobility and ADLs    6. Left sided lacunar stroke [facility])        78 year old male presenting in follow-up from acute inpatient rehab stay after a right posterior limb internal capsule stroke and subsequent CVA.    PLAN:

## 2018-03-29 DIAGNOSIS — Z7409 Other reduced mobility: ICD-10-CM

## 2018-03-29 DIAGNOSIS — I6312 Cerebral infarction due to embolism of basilar artery: Principal | ICD-10-CM

## 2018-03-29 DIAGNOSIS — I6381 Other cerebral infarction due to occlusion or stenosis of small artery: ICD-10-CM

## 2018-04-09 ENCOUNTER — Encounter: Admit: 2018-04-09 | Discharge: 2018-04-09 | Payer: MEDICARE

## 2018-04-09 NOTE — Telephone Encounter
LVM explaining a telephone visit will be conducted for appointment scheduled for 3/23 at 2:00pm. Provided direct call back number for questions or concerns.

## 2018-05-05 ENCOUNTER — Encounter: Admit: 2018-05-05 | Discharge: 2018-05-05 | Payer: MEDICARE

## 2018-05-05 ENCOUNTER — Ambulatory Visit: Admit: 2018-05-05 | Discharge: 2018-05-06 | Payer: MEDICARE

## 2018-05-05 DIAGNOSIS — I5023 Acute on chronic systolic (congestive) heart failure: ICD-10-CM

## 2018-05-05 DIAGNOSIS — M1990 Unspecified osteoarthritis, unspecified site: ICD-10-CM

## 2018-05-05 DIAGNOSIS — I6312 Cerebral infarction due to embolism of basilar artery: Principal | ICD-10-CM

## 2018-05-05 DIAGNOSIS — E039 Hypothyroidism, unspecified: ICD-10-CM

## 2018-05-05 DIAGNOSIS — Z720 Tobacco use: ICD-10-CM

## 2018-05-05 DIAGNOSIS — I2510 Atherosclerotic heart disease of native coronary artery without angina pectoris: ICD-10-CM

## 2018-05-05 DIAGNOSIS — I350 Nonrheumatic aortic (valve) stenosis: ICD-10-CM

## 2018-05-05 DIAGNOSIS — Z8679 Personal history of other diseases of the circulatory system: ICD-10-CM

## 2018-05-05 DIAGNOSIS — I10 Essential (primary) hypertension: ICD-10-CM

## 2018-05-05 DIAGNOSIS — I6529 Occlusion and stenosis of unspecified carotid artery: ICD-10-CM

## 2018-05-05 DIAGNOSIS — E785 Hyperlipidemia, unspecified: ICD-10-CM

## 2018-05-05 DIAGNOSIS — I7090 Unspecified atherosclerosis: ICD-10-CM

## 2018-05-05 DIAGNOSIS — J45909 Unspecified asthma, uncomplicated: ICD-10-CM

## 2018-05-05 DIAGNOSIS — I429 Cardiomyopathy, unspecified: ICD-10-CM

## 2018-05-05 DIAGNOSIS — K635 Polyp of colon: ICD-10-CM

## 2018-05-05 DIAGNOSIS — R54 Age-related physical debility: ICD-10-CM

## 2018-05-05 NOTE — Progress Notes
He denies vision, headaches, confusion, tingling, numbness or weakness. Per daughter, he has movement and sensation in bilateral extremities, balance and gait are normal.       Review of Systems   Constitutional: Positive for appetite change and unexpected weight change.   Endocrine: Positive for cold intolerance.   All other systems reviewed and are negative.      Objective:           ??? albuterol (VENTOLIN HFA) 90 mcg/actuation inhaler Inhale two puffs by mouth into the lungs every 6 hours as needed for Wheezing or Shortness of Breath. Shake well before use.   ??? aspirin 325 mg tablet Take one tablet by mouth daily. Take with food.   ??? atorvastatin (LIPITOR) 40 mg tablet Take one tablet by mouth daily.   ??? carvediloL (COREG) 12.5 mg tablet Take one tablet by mouth twice daily. Take with food.   ??? clopiDOGrel (PLAVIX) 75 mg tablet Take one tablet by mouth daily.   ??? dextromethorphan/guaiFENesin (ROBITUSSIN-DM) 10/100 mg/5 mL syrp oral syrup Take 10 mL by mouth every 4 hours as needed.   ??? doxazosin (CARDURA) 2 mg tablet Take one tablet by mouth at bedtime daily.   ??? dutasteride (AVODART) 0.5 mg PO capsule Take 0.5 mg by mouth Daily.   ??? finasteride (PROSCAR) 5 mg tablet Take 5 mg by mouth daily.   ??? fluticasone propionate (FLOVENT HFA) 110 mcg/actuation inhaler Inhale 1 puff by mouth into the lungs twice daily.   ??? food supplemt, lactose-reduced (ENSURE COMPLETE) 0.05-1.5 gram-kcal/mL liqd Take 1 Can by mouth twice daily with meals.   ??? levothyroxine (SYNTHROID) 112 mcg tablet Take one tablet by mouth daily 30 minutes before breakfast.   ??? montelukast (SINGULAIR) 10 mg tablet Take one tablet by mouth at bedtime daily.   ??? MULTIVITAMIN PO Take  by mouth.   ??? pantoprazole DR (PROTONIX) 40 mg tablet Take one tablet by mouth daily.   ??? predniSONE (DELTASONE) 10 mg tablet Take 10 mg by mouth. Tapering to off.   ??? senna/docusate (SENOKOT-S) 8.6/50 mg tablet Take one tablet by mouth twice daily.     Vitals:

## 2018-05-05 NOTE — Assessment & Plan Note
Currently, Javier Gutierrez is doing well. Speech with some expressive aphasia. Blood pressure has been elevated. Daughter is monitoring daily. She has been in contact with PCP to manage with Lisinopril 5 mg, prn.    Plan:    Continue ASA 325 mg and Plavix 75 mg daily for stroke prevention and L CEA protectyion.  Continue Lipitor 40 mg daily for LDL management and stroke prevention.  Encourage heart healthy diet, low sodium, low fat/cholesterol and low fat.  Encourage moderate daily exercise, walking.  Monitor blood pressure daily and record. Report to PCP.   Blood Pressure Systolic goal < 140.   Take Lisinopril, prn and as directed by PCP.  Follow-up with stroke therapies for PT/OT/ST.

## 2018-05-14 ENCOUNTER — Encounter: Admit: 2018-05-14 | Discharge: 2018-05-14 | Payer: MEDICARE

## 2018-05-21 ENCOUNTER — Ambulatory Visit: Admit: 2018-05-21 | Discharge: 2018-05-21 | Payer: MEDICARE

## 2018-05-21 ENCOUNTER — Encounter: Admit: 2018-05-21 | Discharge: 2018-05-21 | Payer: MEDICARE

## 2018-05-21 DIAGNOSIS — I7090 Unspecified atherosclerosis: ICD-10-CM

## 2018-05-21 DIAGNOSIS — M1990 Unspecified osteoarthritis, unspecified site: ICD-10-CM

## 2018-05-21 DIAGNOSIS — Z8679 Personal history of other diseases of the circulatory system: ICD-10-CM

## 2018-05-21 DIAGNOSIS — J690 Pneumonitis due to inhalation of food and vomit: Secondary | ICD-10-CM

## 2018-05-21 DIAGNOSIS — I6529 Occlusion and stenosis of unspecified carotid artery: ICD-10-CM

## 2018-05-21 DIAGNOSIS — E039 Hypothyroidism, unspecified: ICD-10-CM

## 2018-05-21 DIAGNOSIS — Z720 Tobacco use: ICD-10-CM

## 2018-05-21 DIAGNOSIS — I10 Essential (primary) hypertension: ICD-10-CM

## 2018-05-21 DIAGNOSIS — I350 Nonrheumatic aortic (valve) stenosis: ICD-10-CM

## 2018-05-21 DIAGNOSIS — I2510 Atherosclerotic heart disease of native coronary artery without angina pectoris: ICD-10-CM

## 2018-05-21 DIAGNOSIS — J45909 Unspecified asthma, uncomplicated: ICD-10-CM

## 2018-05-21 DIAGNOSIS — E785 Hyperlipidemia, unspecified: ICD-10-CM

## 2018-05-21 DIAGNOSIS — I5023 Acute on chronic systolic (congestive) heart failure: ICD-10-CM

## 2018-05-21 DIAGNOSIS — K635 Polyp of colon: ICD-10-CM

## 2018-05-21 DIAGNOSIS — R54 Age-related physical debility: ICD-10-CM

## 2018-05-21 DIAGNOSIS — I429 Cardiomyopathy, unspecified: ICD-10-CM

## 2018-05-21 DIAGNOSIS — J4530 Mild persistent asthma, uncomplicated: Principal | ICD-10-CM

## 2018-05-21 NOTE — Progress Notes
Did patient read financial policy, consent to treat, and notice of privacy practices? Yes    Does the patient give verbal consent to each policy? Yes    Does the patient have any vitals to report? Yes    Weight Vitals charted in O2? Yes    Is the patient in pain? 0 = No pain    Screening questions completed? Yes    Is the patient able to acces the Mychart message with the start visit link? Yes    Is patient in virtual waiting room Yes

## 2018-05-26 ENCOUNTER — Encounter: Admit: 2018-05-26 | Discharge: 2018-05-26 | Payer: MEDICARE

## 2018-05-30 ENCOUNTER — Encounter: Admit: 2018-05-30 | Discharge: 2018-05-30 | Payer: MEDICARE

## 2018-06-03 ENCOUNTER — Encounter: Admit: 2018-06-03 | Discharge: 2018-06-03 | Payer: MEDICARE

## 2018-06-06 ENCOUNTER — Ambulatory Visit: Admit: 2018-06-06 | Discharge: 2018-06-06 | Payer: MEDICARE

## 2018-06-06 ENCOUNTER — Encounter: Admit: 2018-06-06 | Discharge: 2018-06-06 | Payer: MEDICARE

## 2018-06-06 DIAGNOSIS — K635 Polyp of colon: ICD-10-CM

## 2018-06-06 DIAGNOSIS — I429 Cardiomyopathy, unspecified: ICD-10-CM

## 2018-06-06 DIAGNOSIS — Z95828 Presence of other vascular implants and grafts: ICD-10-CM

## 2018-06-06 DIAGNOSIS — Z720 Tobacco use: ICD-10-CM

## 2018-06-06 DIAGNOSIS — Z9889 Other specified postprocedural states: ICD-10-CM

## 2018-06-06 DIAGNOSIS — Z952 Presence of prosthetic heart valve: ICD-10-CM

## 2018-06-06 DIAGNOSIS — I444 Left anterior fascicular block: ICD-10-CM

## 2018-06-06 DIAGNOSIS — I10 Essential (primary) hypertension: Principal | ICD-10-CM

## 2018-06-06 DIAGNOSIS — M1990 Unspecified osteoarthritis, unspecified site: ICD-10-CM

## 2018-06-06 DIAGNOSIS — Z8679 Personal history of other diseases of the circulatory system: Secondary | ICD-10-CM

## 2018-06-06 DIAGNOSIS — I5023 Acute on chronic systolic (congestive) heart failure: ICD-10-CM

## 2018-06-06 DIAGNOSIS — I2510 Atherosclerotic heart disease of native coronary artery without angina pectoris: ICD-10-CM

## 2018-06-06 DIAGNOSIS — I6529 Occlusion and stenosis of unspecified carotid artery: ICD-10-CM

## 2018-06-06 DIAGNOSIS — I7090 Unspecified atherosclerosis: ICD-10-CM

## 2018-06-06 DIAGNOSIS — E785 Hyperlipidemia, unspecified: ICD-10-CM

## 2018-06-06 DIAGNOSIS — I739 Peripheral vascular disease, unspecified: ICD-10-CM

## 2018-06-06 DIAGNOSIS — E039 Hypothyroidism, unspecified: ICD-10-CM

## 2018-06-06 DIAGNOSIS — Z953 Presence of xenogenic heart valve: ICD-10-CM

## 2018-06-06 DIAGNOSIS — R54 Age-related physical debility: ICD-10-CM

## 2018-06-06 DIAGNOSIS — J45909 Unspecified asthma, uncomplicated: ICD-10-CM

## 2018-06-06 DIAGNOSIS — Z8673 Personal history of transient ischemic attack (TIA), and cerebral infarction without residual deficits: ICD-10-CM

## 2018-06-06 DIAGNOSIS — I350 Nonrheumatic aortic (valve) stenosis: ICD-10-CM

## 2018-06-06 DIAGNOSIS — Z9181 History of falling: Secondary | ICD-10-CM

## 2018-06-06 MED ORDER — AMLODIPINE 2.5 MG PO TAB
2.5 mg | ORAL_TABLET | Freq: Two times a day (BID) | ORAL | 6 refills | Status: DC
Start: 2018-06-06 — End: 2019-01-01

## 2018-06-29 ENCOUNTER — Emergency Department: Admit: 2018-06-29 | Discharge: 2018-06-29

## 2018-06-29 ENCOUNTER — Emergency Department: Admit: 2018-06-29 | Discharge: 2018-06-29 | Disposition: A | Discharge status: Home / Self Care

## 2018-06-29 ENCOUNTER — Encounter: Admit: 2018-06-29 | Discharge: 2018-06-29

## 2018-06-29 ENCOUNTER — Emergency Department: Admit: 2018-06-29 | Discharge: 2018-06-30

## 2018-06-29 DIAGNOSIS — S0083XA Contusion of other part of head, initial encounter: Secondary | ICD-10-CM

## 2018-06-29 DIAGNOSIS — I429 Cardiomyopathy, unspecified: Secondary | ICD-10-CM

## 2018-06-29 DIAGNOSIS — I7090 Unspecified atherosclerosis: Secondary | ICD-10-CM

## 2018-06-29 DIAGNOSIS — I2510 Atherosclerotic heart disease of native coronary artery without angina pectoris: Secondary | ICD-10-CM

## 2018-06-29 DIAGNOSIS — R54 Age-related physical debility: Secondary | ICD-10-CM

## 2018-06-29 DIAGNOSIS — J45909 Unspecified asthma, uncomplicated: Secondary | ICD-10-CM

## 2018-06-29 DIAGNOSIS — Z7901 Long term (current) use of anticoagulants: Secondary | ICD-10-CM

## 2018-06-29 DIAGNOSIS — S0990XA Unspecified injury of head, initial encounter: Secondary | ICD-10-CM

## 2018-06-29 DIAGNOSIS — I5023 Acute on chronic systolic (congestive) heart failure: Secondary | ICD-10-CM

## 2018-06-29 DIAGNOSIS — I6529 Occlusion and stenosis of unspecified carotid artery: Secondary | ICD-10-CM

## 2018-06-29 DIAGNOSIS — I10 Essential (primary) hypertension: Secondary | ICD-10-CM

## 2018-06-29 DIAGNOSIS — Z23 Encounter for immunization: Secondary | ICD-10-CM

## 2018-06-29 DIAGNOSIS — S61511A Laceration without foreign body of right wrist, initial encounter: Secondary | ICD-10-CM

## 2018-06-29 DIAGNOSIS — Z8679 Personal history of other diseases of the circulatory system: Secondary | ICD-10-CM

## 2018-06-29 DIAGNOSIS — Z720 Tobacco use: Secondary | ICD-10-CM

## 2018-06-29 DIAGNOSIS — M1990 Unspecified osteoarthritis, unspecified site: Secondary | ICD-10-CM

## 2018-06-29 DIAGNOSIS — K635 Polyp of colon: Secondary | ICD-10-CM

## 2018-06-29 DIAGNOSIS — E039 Hypothyroidism, unspecified: Secondary | ICD-10-CM

## 2018-06-29 DIAGNOSIS — W19XXXA Unspecified fall, initial encounter: Secondary | ICD-10-CM

## 2018-06-29 DIAGNOSIS — E785 Hyperlipidemia, unspecified: Secondary | ICD-10-CM

## 2018-06-29 DIAGNOSIS — I350 Nonrheumatic aortic (valve) stenosis: Secondary | ICD-10-CM

## 2018-06-29 LAB — URINALYSIS DIPSTICK
Lab: NEGATIVE MMOL/L (ref 21–30)
Lab: NEGATIVE U/L (ref 7–40)
Lab: NEGATIVE U/L (ref 7–56)
Lab: NEGATIVE g/dL (ref 6.0–8.0)
Lab: NEGATIVE g/dL — ABNORMAL HIGH (ref 3.5–5.0)
Lab: NEGATIVE mg/dL — ABNORMAL HIGH (ref 8.5–10.6)

## 2018-06-29 LAB — URINALYSIS, MICROSCOPIC

## 2018-06-29 LAB — TROPONIN-I: Lab: 0 ng/mL — ABNORMAL LOW (ref >60)

## 2018-06-29 LAB — POC CREATININE, RAD: Lab: 1.6 mg/dL — ABNORMAL HIGH (ref 0.4–1.24)

## 2018-06-29 LAB — CBC AND DIFF
Lab: 0.1 10*3/uL (ref 0–0.20)
Lab: 0.5 10*3/uL — ABNORMAL HIGH (ref 0–0.45)
Lab: 12 10*3/uL — ABNORMAL HIGH (ref 4.5–11.0)

## 2018-06-29 LAB — COMPREHENSIVE METABOLIC PANEL
Lab: 10 K/UL — ABNORMAL HIGH (ref 3–12)
Lab: 138 MMOL/L (ref 137–147)
Lab: 39 mL/min — ABNORMAL LOW (ref >60)
Lab: 48 mL/min — ABNORMAL LOW (ref >60)

## 2018-06-29 LAB — BNP (B-TYPE NATRIURETIC PEPTI): Lab: 95 pg/mL (ref 0–100)

## 2018-06-29 MED ORDER — SODIUM CHLORIDE 0.9 % IJ SOLN
50 mL | Freq: Once | INTRAVENOUS | 0 refills | Status: CP
Start: 2018-06-29 — End: ?
  Administered 2018-06-29: 15:00:00 50 mL via INTRAVENOUS

## 2018-06-29 MED ORDER — LACTATED RINGERS IV SOLP
250 mL | Freq: Once | INTRAVENOUS | 0 refills | Status: CP
Start: 2018-06-29 — End: ?
  Administered 2018-06-29: 14:00:00 250 mL via INTRAVENOUS

## 2018-06-29 MED ORDER — SODIUM CHLORIDE 0.9 % IR SOLN
Freq: Once | 0 refills | Status: CP
Start: 2018-06-29 — End: ?
  Administered 2018-06-29: 17:00:00 1000.000 mL

## 2018-06-29 MED ORDER — DOXYCYCLINE MONOHYDRATE 100 MG PO TAB
100 mg | ORAL_TABLET | Freq: Two times a day (BID) | ORAL | 0 refills | 8.00000 days | Status: AC
Start: 2018-06-29 — End: ?

## 2018-06-29 MED ORDER — IOHEXOL 350 MG IODINE/ML IV SOLN
80 mL | Freq: Once | INTRAVENOUS | 0 refills | Status: CP
Start: 2018-06-29 — End: ?
  Administered 2018-06-29: 15:00:00 80 mL via INTRAVENOUS

## 2018-06-29 MED ORDER — LIDOCAINE-RACEPINEP-TETRACAINE 4-0.05-0.5 % TP GEL
Freq: Once | TOPICAL | 0 refills | Status: CP
Start: 2018-06-29 — End: ?
  Administered 2018-06-29: 17:00:00 3.000 mL via TOPICAL

## 2018-06-29 MED ORDER — DIPHTH,PERTUS(ACELL),TETANUS 2.5-8-5 LF-MCG-LF/0.5ML IM SUSP
.5 mL | Freq: Once | INTRAMUSCULAR | 0 refills | Status: CP
Start: 2018-06-29 — End: ?
  Administered 2018-06-29: 17:00:00 0.5 mL via INTRAMUSCULAR

## 2018-06-29 MED ORDER — DOXYCYCLINE HYCLATE 100 MG PO TAB
100 mg | Freq: Once | ORAL | 0 refills | Status: CP
Start: 2018-06-29 — End: ?
  Administered 2018-06-29: 20:00:00 100 mg via ORAL

## 2018-06-29 MED ORDER — LIDOCAINE-EPINEPHRINE 1 %-1:100,000 IJ SOLN
30 mL | Freq: Once | INTRAMUSCULAR | 0 refills | Status: CP
Start: 2018-06-29 — End: ?
  Administered 2018-06-29: 17:00:00 30 mL via INTRAMUSCULAR

## 2018-06-29 MED ORDER — LACTATED RINGERS IV SOLP
250 mL | Freq: Once | INTRAVENOUS | 0 refills | Status: CP
Start: 2018-06-29 — End: ?
  Administered 2018-06-29: 17:00:00 250 mL via INTRAVENOUS

## 2018-06-29 NOTE — ED Notes
Pt to CT

## 2018-06-29 NOTE — ED Notes
Pt A&Ox4 and given discharge instructions, prescriptions, and follow up information. Pt verbalizes understanding and has no further questions or complaints. VSS. Pt wheeled out of the ED home by POV driven by his relative.

## 2018-08-19 ENCOUNTER — Ambulatory Visit: Admit: 2018-08-19 | Discharge: 2018-08-19

## 2018-08-19 ENCOUNTER — Encounter: Admit: 2018-08-19 | Discharge: 2018-08-19

## 2018-08-19 DIAGNOSIS — I6523 Occlusion and stenosis of bilateral carotid arteries: Secondary | ICD-10-CM

## 2018-08-28 ENCOUNTER — Ambulatory Visit: Admit: 2018-08-28 | Discharge: 2018-08-29

## 2018-08-28 NOTE — Progress Notes
I have personally interviewed Javier Gutierrez and reviewed the history, impression, and plan of care as outlined by Dr.Joseph Wend, DO. I personally  participated in patient counseling and coordination of care.  I agree with the assessment and plan as documented by Dr. Terance Hart, DO.

## 2018-08-28 NOTE — Patient Instructions
Continue to take medications as prescribed    Continue to take daily blood pressure and record them    I will reach out to cardiology and Primary care concerning hypertension and possible prostate enlargement.    Return to clinic just as needed, or if any major changes concerned about from a neurological perspective.

## 2018-08-28 NOTE — Progress Notes
Neurology Clinic Note    Tele-health, via phone approx 45 minutes    History of Present Illness: Javier Gutierrez is a 78 y.o. 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  He has R ICA 50% stenosis as well, and repeat US showed no significant recurrent stenosis of the left ICA.  Pt has been participating in [facility] PT/OT/SLP and has made much improvement, doesn't say he has any right sided weakness, but he does have some persistent expressive aphasia.  Pt has not had any worsening of neurological symptoms and no new complaints. He did have a fall recently after tripping on a blanket at night, as he was getting up to urinate. He has to get up to urinate almost every 2 hours at night.  He has been compliant with medications, taking his ASA/Plavix.  He continues to not smoke, and has been recording his BP which is running around 160-170's on Norvasc 2.5 mg BID.  Pt denies new numbness, weakness, facial droop, recurrent falls, ataxia, SOB.      Medical History:   Diagnosis Date   ??? Acute on chronic systolic heart failure, NYHA class 2 (HCC)    ??? Aortic valve stenosis, mild 11/29/2008   ??? Arterial occlusion     left kidney-two stents placed   ??? Arthritis     lower back   ??? Asthma    ??? CAD (coronary artery disease) 11/29/2008   ??? Cardiomyopathy (HCC) 11/29/2008   ??? Carotid artery plaque    ??? Colon polyps    ??? Frailty    ??? 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 HTN    ??? Hyperlipidemia    ??? Hyperlipidemia 11/29/2008   ??? Hypertension 11/29/2008   ??? Hypothyroidism    ??? Tobacco abuse      Surgical History:   Procedure Laterality Date   ??? HX LUMBAR DISKECTOMY  1968    30 years ago   ??? STENT INTRAVASCULAR  2006 kidney stent placed   ??? Left Heart Catheterization With Ventriculogram Left 03/11/2015    Performed by Marcell Barlow, MD, [facility] at [facility] CATH LAB   ??? Coronary Angiography N/A 03/11/2015    Performed by Marcell Barlow, MD, [facility] at [facility] CATH LAB   ??? Possible Percutaneous Coronary Intervention N/A 03/11/2015    Performed by Marcell Barlow, MD, [facility] at [facility] CATH LAB   ??? ESOPHAGOGASTRODUODENOSCOPY N/A 04/26/2015    Performed by Tempie Hoist, DO at [facility] ENDO   ??? ESOPHAGOGASTRODUODENOSCOPY BIOPSY  04/26/2015    Performed by Tempie Hoist, DO at [facility] ENDO   ??? REPLACEMENT TRANSCATHETER AORTIC VALVE (Sapien 26s3), right common femoral artery approach N/A 08/03/2015    Performed by Zella Richer, MD at [facility] CVOR   ??? BRONCHOSCOPY Bilateral 11/30/2016    Performed by Audie Box, MD at [facility] OR   ??? BRONCHOSCOPY WITH BRONCHIAL ALVEOLAR LAVAGE - FLEXIBLE Right 11/30/2016    Performed by Audie Box, MD at [facility] OR   ??? ESOPHAGOGASTRODUODENOSCOPY for PEG placement N/A 12/06/2016    Performed by Buckles, Vinnie Level, MD at [facility] ENDO   ??? ENDARTERECTOMY CAROTID ARTERY Left  02/17/2018    Performed by Buck Mam, MD at [facility] OR     Social History     Tobacco Use   ??? Smoking status: Current Every Day Smoker     Packs/day: 0.50     Years: 60.00     Pack years: 30.00     Types: Cigarettes   ??? Smokeless tobacco: Never Used   ??? Tobacco comment: 0.5-2 ppd history   Substance Use Topics   ??? Alcohol use: No   ??? Drug use: No     Family History   Problem Relation Age of Onset   ??? Stroke Mother    ??? Other Mother         colon cancer   ??? Other Father         valve disease/COPD       No Known Allergies      Current Outpatient Medications:   ???  albuterol (VENTOLIN HFA) 90 mcg/actuation inhaler, Inhale two puffs by mouth into the lungs every 6 hours as needed for Wheezing or Shortness of Breath. Shake well before use., Disp: 2 Inhaler, Rfl: 1  ???  amLODIPine (NORVASC) 2.5 mg tablet, Take one tablet by mouth twice daily. Indications: high blood pressure, Disp: 60 tablet, Rfl: 6  ???  aspirin 325 mg tablet, Take one tablet by mouth daily. Take with food., Disp: 90 tablet, Rfl:   ???  atorvastatin (LIPITOR) 40 mg tablet, Take one tablet by mouth daily., Disp: 30 tablet, Rfl: 1  ???  carvediloL (COREG) 12.5 mg tablet, Take one tablet by mouth twice daily. Take with food., Disp: 60 tablet, Rfl: 1  ???  clopiDOGrel (PLAVIX) 75 mg tablet, Take one tablet by mouth daily., Disp: 30 tablet, Rfl: 1  ???  dextromethorphan/guaiFENesin (ROBITUSSIN-DM) 10/100 mg/5 mL syrp oral syrup, Take 10 mL by mouth every 4 hours as needed., Disp: , Rfl:   ???  doxazosin (CARDURA) 2 mg tablet, Take one tablet by mouth at bedtime daily., Disp: 30 tablet, Rfl: 1  ???  dutasteride (AVODART) 0.5 mg PO capsule, Take 0.5 mg by mouth Daily., Disp: , Rfl:   ???  finasteride (PROSCAR) 5 mg tablet, Take 5 mg by mouth daily., Disp: , Rfl:   ???  fluticasone propionate (FLOVENT HFA) 110 mcg/actuation inhaler, Inhale 1 puff by mouth into the lungs twice daily., Disp: , Rfl:   ???  food supplemt, lactose-reduced (ENSURE COMPLETE) 0.05-1.5 gram-kcal/mL liqd, Take 1 Can by mouth twice daily with meals., Disp: , Rfl:   ???  levothyroxine (SYNTHROID) 112 mcg tablet, Take one tablet by mouth daily 30 minutes before breakfast., Disp: 90 tablet, Rfl:   ???  montelukast (SINGULAIR) 10 mg tablet, Take one tablet by mouth at bedtime daily., Disp: 30 tablet, Rfl: 1  ???  MULTIVITAMIN PO, Take  by mouth., Disp: , Rfl:   ???  pantoprazole DR (PROTONIX) 40 mg tablet, Take one tablet by mouth daily., Disp: 30 tablet, Rfl: 1  ???  predniSONE (DELTASONE) 10 mg tablet, Take 10 mg by mouth. Tapering to off., Disp: , Rfl:   ???  senna/docusate (SENOKOT-S) 8.6/50 mg tablet, Take one tablet by mouth twice daily., Disp: , Rfl:      Review of Systems:    Review of Systems   Constitutional: Negative.    Eyes: Negative for double vision.   Respiratory: Negative for shortness of breath. Cardiovascular: Positive for leg swelling. Negative for chest pain.   Genitourinary: Positive for frequency.   Musculoskeletal: Positive  for falls.   Skin: Negative.    Neurological: Negative for sensory change, focal weakness and loss of consciousness.   Endo/Heme/Allergies: Negative.    Psychiatric/Behavioral: Negative.       There were no vitals filed for this visit.     PE: telehealth    Speech was slightly dysarthric/slurred over phone. Able to converse though, with some scarcity of words though, likely had some expressive aphasia and mainly just one to two word answers       Lab/Radiology/Other Diagnostic Tests:  24-hour labs:      Assessment:    Pt is here for f/u for L lacunar infarct, s/p L ICA CEA and stent. Repeat US showed stable stenosis of right ICA, and patent left. Pt has been slowly improving over the past months, and mainly just has persistent expressive aphasia. His BP is still not optimized. But CV is managing his BP. He is to follow up with them soon. His appetite has continued to increase and he has gained some wait, gait has been good as well and he doesn't require any assistant devices.  From a neurological standpoint he has made a lot of improvement, and appears to be stable from a stroke standpoint.    Plan:  - continue ASA/Plavix - defer to cardiology/neurosurgery for when to stop  - continue to optimize BP with goal of <130/80  - continue with high dose statin  - continue with cardiac diet, advised pt to try to minimize mountain dew intake - or at least try to drink diet dew and gatorade with less sugar to decrease water diuresis  - continue with PT/OT/SLP w/ HH  - recommended daughter continue to have pt read, talk, etc to develop and improve on expressive aphasia  - will reach out to PCP concerning BPH, pt had recent fall due to increased nocturia    Return to clinic just as needed, pt stable currently from neurological standpoint    Pt discussed and seen (via phone) w/ Dr. Genevie Cheshire. Elliot Cousin, DO  Neurology PGY2

## 2018-08-29 DIAGNOSIS — I6381 Other cerebral infarction due to occlusion or stenosis of small artery: Principal | ICD-10-CM

## 2018-08-29 DIAGNOSIS — I69320 Aphasia following cerebral infarction: Secondary | ICD-10-CM

## 2018-09-08 ENCOUNTER — Encounter: Admit: 2018-09-08 | Discharge: 2018-09-08 | Payer: MEDICARE

## 2018-09-10 ENCOUNTER — Encounter: Admit: 2018-09-10 | Discharge: 2018-09-10

## 2018-09-10 NOTE — Telephone Encounter
Contacted patient's daughter, "Altha Harm", by phone on 09/10/2018 to troubleshoot patient's telehealth appointment with Dr. Samantha Crimes on 09/11/2018. Patient plans to use personal smart phone for telehealth visit. Patient has username and password for personal MyChart account. Patient has downloaded Zoom and MyChart apps to personal smart phone. Patient and patient's daughter voiced understanding of instructions on how to access and begin telehealth visit with Dr. Samantha Crimes. Patient will begin telehealth visit 15 minutes prior to scheduled appointment time. Patient will check blood pressure with personal monitor and have current medication list ready for scheduled appointment time.

## 2018-09-11 ENCOUNTER — Ambulatory Visit: Admit: 2018-09-11 | Discharge: 2018-09-11

## 2018-09-11 ENCOUNTER — Encounter: Admit: 2018-09-11 | Discharge: 2018-09-11

## 2018-09-11 DIAGNOSIS — I10 Essential (primary) hypertension: Secondary | ICD-10-CM

## 2018-09-11 DIAGNOSIS — E7800 Pure hypercholesterolemia, unspecified: Secondary | ICD-10-CM

## 2018-09-11 DIAGNOSIS — K635 Polyp of colon: Secondary | ICD-10-CM

## 2018-09-11 DIAGNOSIS — I5023 Acute on chronic systolic (congestive) heart failure: Secondary | ICD-10-CM

## 2018-09-11 DIAGNOSIS — I6312 Cerebral infarction due to embolism of basilar artery: Secondary | ICD-10-CM

## 2018-09-11 DIAGNOSIS — I429 Cardiomyopathy, unspecified: Secondary | ICD-10-CM

## 2018-09-11 DIAGNOSIS — I2510 Atherosclerotic heart disease of native coronary artery without angina pectoris: Secondary | ICD-10-CM

## 2018-09-11 DIAGNOSIS — Z8679 Personal history of other diseases of the circulatory system: Secondary | ICD-10-CM

## 2018-09-11 DIAGNOSIS — N183 Chronic kidney disease, stage 3 (moderate): Secondary | ICD-10-CM

## 2018-09-11 DIAGNOSIS — Z720 Tobacco use: Secondary | ICD-10-CM

## 2018-09-11 DIAGNOSIS — I6529 Occlusion and stenosis of unspecified carotid artery: Secondary | ICD-10-CM

## 2018-09-11 DIAGNOSIS — I7090 Unspecified atherosclerosis: Secondary | ICD-10-CM

## 2018-09-11 DIAGNOSIS — E785 Hyperlipidemia, unspecified: Secondary | ICD-10-CM

## 2018-09-11 DIAGNOSIS — R54 Age-related physical debility: Secondary | ICD-10-CM

## 2018-09-11 DIAGNOSIS — I350 Nonrheumatic aortic (valve) stenosis: Secondary | ICD-10-CM

## 2018-09-11 DIAGNOSIS — I5032 Chronic diastolic (congestive) heart failure: Secondary | ICD-10-CM

## 2018-09-11 DIAGNOSIS — M1990 Unspecified osteoarthritis, unspecified site: Secondary | ICD-10-CM

## 2018-09-11 DIAGNOSIS — Z9889 Other specified postprocedural states: Secondary | ICD-10-CM

## 2018-09-11 DIAGNOSIS — I2699 Other pulmonary embolism without acute cor pulmonale: Secondary | ICD-10-CM

## 2018-09-11 DIAGNOSIS — I739 Peripheral vascular disease, unspecified: Secondary | ICD-10-CM

## 2018-09-11 DIAGNOSIS — E039 Hypothyroidism, unspecified: Secondary | ICD-10-CM

## 2018-09-11 DIAGNOSIS — Z952 Presence of prosthetic heart valve: Secondary | ICD-10-CM

## 2018-09-11 DIAGNOSIS — I6523 Occlusion and stenosis of bilateral carotid arteries: Secondary | ICD-10-CM

## 2018-09-11 DIAGNOSIS — J45909 Unspecified asthma, uncomplicated: Secondary | ICD-10-CM

## 2018-09-11 NOTE — Progress Notes
Obtained patient's, or patient proxy's, verbal consent to treat them and their agreement to [facility] financial policy and NPP via this telehealth visit during the [facility] Emergency      Date of Service: 09/11/2018    Javier Gutierrez is a 78 y.o. male.       HPI     Javier Gutierrez was seen today with the help of a telehealth/video visit, patient's daughter present for this visit helped with most of the history.  He was last evaluated on 06/06/2018.    Patient was seen in the emergency room department at [facility] on June 29, 2018 after sustaining a fall.  The fall was not preceded by loss of consciousness.  Apparently he is feet tangled in the blanket and when he tried to get out of bed he fell on the floor.  He did not sustain any significant injuries, a head CT did demonstrate a small right frontal scalp hematoma but there were no other significant findings.    This is a patient that does have a history of CVA, this occurred on 02/11/2018, he was found to have a 75% stenosis of the left ICA, he did go to the OR on 02/17/2018 for left carotid endarterectomy, intraoperatively he was found to have persistent persistent severe luminal stenosis distal to the operative area, CT of the head and neck were obtained and upon review of the images he was then taken to the IR and he underwent a left carotid stent placement for residual left ICA stenosis after left CEA.    A carotid ultrasound dated 08/19/2018 demonstrated moderate to severe right ICA plaque producing approximately 50% stenosis, it was no evidence of recurrent left carotid artery stenosis.    Patient also has a history of hypertension, tobacco use, severe aortic valve stenosis, he did undergo TAVR on 08/04/2015, at that time patient received a #21 CPN valve, the most recent echocardiogram dated 02/11/2018 demonstrated a normal function of the bioprosthetic aortic valve with a mean gradient of approximately 6 mmHg, patient also had severe MAC with mild stenosis and a gradient of 5 mmHg at a heart rate of 77 bpm.    Patient says and his daughter attests to that that he is not having any symptoms of chest pain, he is able to walk on a daily basis without the help of a walker or a cane, he has not had any presyncope or syncope.           Vitals:    09/11/18 0854   BP: (!) 155/76   BP Source: Arm, Left Upper   Pulse: 74   Weight: 75.8 kg (167 lb 3.2 oz)   Height: 1.778 m (5' 10)   PainSc: Zero     Body mass index is 23.99 kg/m???.     Past Medical History  Patient Active Problem List    Diagnosis Date Noted   ??? 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 History of left-sided carotid endarterectomy 06/06/2018   ??? Mild persistent asthma without complication 05/21/2018   ??? Acute blood loss as cause of postoperative anemia 02/24/2018   ??? Dysarthria 02/21/2018   ??? Decreased functional mobility and endurance 02/21/2018   ??? Impaired mobility and ADLs 02/21/2018   ??? Facial droop 02/21/2018   ??? Left sided lacunar stroke (HCC) 02/21/2018   ??? History of pneumonia 01/24/2017   ??? History of pulmonary embolus (PE) 01/24/2017   ??? Impaired  mobility 12/12/2016   ??? Pulmonary embolism (HCC) 12/12/2016   ??? Necrotizing pneumonia (HCC) 12/12/2016   ??? Parapneumonic effusion 11/21/2016   ??? Pneumonia 11/20/2016   ??? Localized edema 12/13/2015   ??? 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 Chronic diastolic (congestive) heart failure (HCC) 08/02/2015   ??? Stage 3 chronic kidney disease (HCC) 04/05/2015   ??? Loss of appetite 04/05/2015   ??? PAD (peripheral artery disease) (HCC) 04/05/2015   ??? Aortic stenosis 03/11/2015   ??? Weight loss 03/10/2015   ??? Frailty    ??? Tobacco abuse    ??? Acute on chronic systolic heart failure, NYHA class 2 (HCC)    ??? Palpable abdominal aorta 01/25/2015   ??? Systolic murmur of aorta 04/29/2014   ??? Weight loss, unintentional 05/01/2012 ??? 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 Tobacco use disorder 04/28/2010   ??? Bilateral carotid artery disease (HCC) 04/28/2010   ??? Cardiomyopathy (HCC) 11/29/2008     History of cardiomyopathy - left ventricular systolic function has normalized.     ??? History of renal artery stenosis 11/29/2008     A history of renal artery stenosis, status post PTA to left renal artery on 05/2001.     ??? Cerebral embolism with cerebral infarction (HCC) 11/29/2008     Left Lacunar Infarct  Left ICA stenosis 75%  Etiology of stroke: small vessel   - MRI with acute infract at junction of lentiform nucleus + post limb of internal capsule  - CTA h+n notable for L ICA stenosis (75%), R ICA stenosis (50%), right vert occlusion  - LDL 129   - A1c- 5.4  - ECHO- EF 55%, Grad 2 LV diastolic dysfunction. No shunt, no thrombus. LA 3.69cm.   - s/p right CEA and  Right ICA stent placement on 1/27.     ??? Hypertension 11/29/2008   ??? Hyperlipidemia 11/29/2008   ??? CAD (coronary artery disease) 11/29/2008     03/11/15: Cardiac cath ( via Rt radial approach), 30%pLAD, 40%mLAD, 50-60%mLcx, 30-40%pRCA, 30%mRCA followed by 30% distal  - aggressive medial therapy for CAD.     ??? H/O 11/29/2008         Review of Systems   Constitution: Negative.   HENT: Negative.    Eyes: Negative.    Cardiovascular: Positive for leg swelling.   Respiratory: Negative.    Endocrine: Negative.    Hematologic/Lymphatic: Negative.    Skin: Negative.    Musculoskeletal: Negative.    Gastrointestinal: Negative.    Genitourinary: Positive for frequency and nocturia.   Neurological: Negative.    Psychiatric/Behavioral: Positive for memory loss.   Allergic/Immunologic: Negative.        Physical Exam    Physical exam could not be performed, this was a video office visit through Telehealth.     Cardiovascular Studies      Problems Addressed Today  Encounter Diagnoses   Name Primary?   ??? Nonrheumatic aortic valve stenosis Yes   ??? Essential hypertension ??? Pure hypercholesterolemia    ??? Coronary artery disease involving native coronary artery of native heart without angina pectoris    ??? PAD (peripheral artery disease) (HCC)    ??? Chronic diastolic (congestive) heart failure (HCC)    ??? Cardiomyopathy, unspecified type (HCC)    ??? 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 Bilateral carotid artery stenosis    ??? S/P TAVR (transcatheter aortic valve replacement)    ??? Other acute pulmonary  embolism without acute cor pulmonale (HCC)    ??? History of left-sided carotid endarterectomy    ??? Stage 3 chronic kidney disease (HCC)        Assessment and Plan     In summary: This is a 78 year old white male that has a history of CVA, patient is status post left CEA and also left internal carotid artery stent placement due to persistent luminal stenosis of the left ICA, patient does have a history of severe left ear, he underwent TAVR in July 2017 (the valve is not functioning normally), patient is known to have nonobstructive CAD and is currently not having any symptoms of chest pain.    He has remained hemodynamically stable.    Plan:    1.  Continue all current medications  2.  I did recommend to continue physical activity  3.  Further evaluation with a 2D echo Doppler study before the next office visit with me in March???April 2021.    Today I spent approximately 25???30 minutes on this telehealth/video evaluation.         Current Medications (including today's revisions)  ??? albuterol (VENTOLIN HFA) 90 mcg/actuation inhaler Inhale two puffs by mouth into the lungs every 6 hours as needed for Wheezing or Shortness of Breath. Shake well before use.   ??? albuterol 0.083% (PROVENTIL) 2.5 mg /3 mL (0.083 %) nebulizer solution Inhale 3 mL solution by nebulizer as directed twice daily.   ??? amLODIPine (NORVASC) 2.5 mg tablet Take one tablet by mouth twice daily. Indications: high blood pressure   ??? aspirin 325 mg tablet Take one tablet by mouth daily. Take with food. ??? atorvastatin (LIPITOR) 40 mg tablet Take one tablet by mouth daily.   ??? carvediloL (COREG) 12.5 mg tablet Take one tablet by mouth twice daily. Take with food.   ??? clopiDOGrel (PLAVIX) 75 mg tablet Take one tablet by mouth daily.   ??? doxazosin (CARDURA) 2 mg tablet Take one tablet by mouth at bedtime daily. (Patient taking differently: Take 2 mg by mouth daily.)   ??? dutasteride (AVODART) 0.5 mg PO capsule Take 0.5 mg by mouth Daily.   ??? fluticasone propionate (FLOVENT HFA) 110 mcg/actuation inhaler Inhale 1 puff by mouth into the lungs twice daily.   ??? food supplemt, lactose-reduced (ENSURE COMPLETE) 0.05-1.5 gram-kcal/mL liqd Take 1 Can by mouth twice daily with meals.   ??? furosemide (LASIX) 20 mg tablet Take 20 mg by mouth every 48 hours.   ??? levothyroxine (SYNTHROID) 112 mcg tablet Take one tablet by mouth daily 30 minutes before breakfast.   ??? montelukast (SINGULAIR) 10 mg tablet Take one tablet by mouth at bedtime daily.   ??? other medication Prevagen 1 tablet daily   ??? pantoprazole DR (PROTONIX) 40 mg tablet Take one tablet by mouth daily.   ??? potassium chloride SR (K-DUR) 10 mEq tablet Take 10 mEq by mouth every 48 hours. Take with a meal and a full glass of water.

## 2019-01-01 ENCOUNTER — Encounter: Admit: 2019-01-01 | Discharge: 2019-01-01 | Payer: MEDICARE

## 2019-01-01 MED ORDER — AMLODIPINE 2.5 MG PO TAB
ORAL_TABLET | Freq: Two times a day (BID) | 9 refills | Status: DC
Start: 2019-01-01 — End: 2019-06-05

## 2019-03-30 ENCOUNTER — Encounter: Admit: 2019-03-30 | Discharge: 2019-03-30 | Payer: MEDICARE

## 2019-03-30 ENCOUNTER — Ambulatory Visit: Admit: 2019-03-30 | Discharge: 2019-03-30 | Payer: MEDICARE

## 2019-03-30 DIAGNOSIS — I350 Nonrheumatic aortic (valve) stenosis: Secondary | ICD-10-CM

## 2019-03-30 DIAGNOSIS — I10 Essential (primary) hypertension: Secondary | ICD-10-CM

## 2019-03-30 MED ORDER — PERFLUTREN LIPID MICROSPHERES 1.1 MG/ML IV SUSP
1-20 mL | Freq: Once | INTRAVENOUS | 0 refills | Status: CP | PRN
Start: 2019-03-30 — End: ?

## 2019-03-31 ENCOUNTER — Encounter: Admit: 2019-03-31 | Discharge: 2019-03-31 | Payer: MEDICARE

## 2019-03-31 NOTE — Telephone Encounter
Results and recommendations called to patient's dtr.

## 2019-03-31 NOTE — Telephone Encounter
-----   Message from Dorris Fetch, MD sent at 03/31/2019  1:12 PM CST -----  Please let the patient know that his echocardiogram showed normal function and normal function of the valve (TAVR).  The last few times have seen him it was through telehealth.  I believe he lives with his daughter which is the primary caregiver.  If he can come to the office in [place] does fine if not I will continue to see him through telehealth.Thank you  ----- Message -----  From: Laurence Aly, MD  Sent: 03/30/2019   2:51 PM CST  To: Dorris Fetch, MD

## 2019-04-02 ENCOUNTER — Encounter: Admit: 2019-04-02 | Discharge: 2019-04-02 | Payer: MEDICARE

## 2019-04-02 NOTE — Progress Notes
Date of Service: 04/02/2019    Javier Gutierrez is a 79 y.o. male.       HPI     Mr. Hinerman  is a 79 year old white male that has a history of nonobstructive coronary artery disease, this was diagnosed by a heart catheterization performed in February 2017, he also has a history of severe aortic valve stenosis, patient did undergo TAVR in July 2017, he did receive the #29 SAPIEN S3 valve.  He was evaluated with the 2D echo Doppler study on 308 2021, the study demonstrated normal left ventricular systolic function, the bioprosthetic valve function was normal with a mean gradient of 6 mmHg.  In addition, mitral annular calcification was present with mild stenosis and a mean gradient of 4 mmHg at a heart rate of 74 bpm.    Unfortunately, last year in June 2020 patient did sustain a stroke, he was found to have 75 stenosis of the left ICA, patient did undergo a left carotid endarterectomy on July 19, 2018, intraoperatively he was found to have persistent stenosis distal to the operated area, finally patient was taken to the IR and he underwent left carotid stent placement for residual left ICA stenosis after the left CEA.    He also had a fall in June 2020, it did not occur due to loss of consciousness.    Patient has been living with his daughter since he has a stroke, she appears to be quite involved in his care.  Mr. Kazmer is planning to go back to independent living in his apartment, however his children feel that he is not quite fit to doing this.         Vitals:    04/02/19 1411   BP: (!) 152/80   BP Source: Arm, Left Upper   Patient Position: Sitting   Pulse: 92   SpO2: 97%   Weight: 82.2 kg (181 lb 3.2 oz)   Height: 1.778 m (5' 10)   PainSc: Zero     Body mass index is 26 kg/m?[name] Kitchen     Past Medical History  Patient Active Problem List    Diagnosis Date Noted   ? 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 History of left-sided carotid endarterectomy 06/06/2018   ? Mild persistent asthma without complication 05/21/2018   ? Acute blood loss as cause of postoperative anemia 02/24/2018   ? Dysarthria 02/21/2018   ? Decreased functional mobility and endurance 02/21/2018   ? Impaired mobility and ADLs 02/21/2018   ? Facial droop 02/21/2018   ? Left sided lacunar stroke (HCC) 02/21/2018   ? History of pneumonia 01/24/2017   ? History of pulmonary embolus (PE) 01/24/2017   ? Impaired mobility 12/12/2016   ? Pulmonary embolism (HCC) 12/12/2016   ? Necrotizing pneumonia (HCC) 12/12/2016   ? Parapneumonic effusion 11/21/2016   ? Pneumonia 11/20/2016   ? Localized edema 12/13/2015   ? 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 Chronic diastolic (congestive) heart failure (HCC) 08/02/2015   ? Stage 3 chronic kidney disease (HCC) 04/05/2015   ? Loss of appetite 04/05/2015   ? PAD (peripheral artery disease) (HCC) 04/05/2015   ? Aortic stenosis 03/11/2015   ? Weight loss 03/10/2015   ? Frailty    ? Tobacco abuse    ? Acute on chronic systolic heart failure, NYHA class 2 (HCC)    ? Palpable abdominal aorta 01/25/2015   ? Systolic murmur of aorta 04/29/2014   ?  Weight loss, unintentional 05/01/2012   ? 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 Tobacco use disorder 04/28/2010   ? Bilateral carotid artery disease (HCC) 04/28/2010   ? Cardiomyopathy (HCC) 11/29/2008     History of cardiomyopathy - left ventricular systolic function has normalized.     ? History of renal artery stenosis 11/29/2008     A history of renal artery stenosis, status post PTA to left renal artery on 05/2001.     ? Cerebral embolism with cerebral infarction (HCC) 11/29/2008     Left Lacunar Infarct  Left ICA stenosis 75%  Etiology of stroke: small vessel   - MRI with acute infract at junction of lentiform nucleus + post limb of internal capsule  - CTA h+n notable for L ICA stenosis (75%), R ICA stenosis (50%), right vert occlusion  - LDL 129   - A1c- 5.4  - ECHO- EF 55%, Grad 2 LV diastolic dysfunction. No shunt, no thrombus. LA 3.69cm.   - s/p right CEA and  Right ICA stent placement on 1/27.     ? Hypertension 11/29/2008   ? Hyperlipidemia 11/29/2008   ? CAD (coronary artery disease) 11/29/2008     03/11/15: Cardiac cath ( via Rt radial approach), 30%pLAD, 40%mLAD, 50-60%mLcx, 30-40%pRCA, 30%mRCA followed by 30% distal  - aggressive medial therapy for CAD.     ? H/O 11/29/2008         Review of Systems   Constitution: Negative.   HENT: Negative.    Eyes: Negative.    Cardiovascular: Positive for leg swelling.   Respiratory: Negative.    Endocrine: Negative.    Hematologic/Lymphatic: Negative.    Skin: Negative.    Musculoskeletal: Negative.    Gastrointestinal: Negative.    Genitourinary: Negative.    Neurological: Negative.    Psychiatric/Behavioral: Negative.    Allergic/Immunologic: Negative.        Physical Exam  General Appearance: Patient appears weak and probably physically deconditioned, most of the conversation is carried with his daughter  Skin: warm, moist, no ulcers or xanthomas  Eyes: conjunctivae and lids normal, pupils are equal and round  Lips & Oral Mucosa: no pallor or cyanosis  Neck Veins: neck veins are flat, neck veins are not distended  Chest Inspection: chest is normal in appearance  Respiratory Effort: breathing comfortably, no respiratory distress  Auscultation/Percussion: lungs clear to auscultation, no rales or rhonchi, no wheezing  Cardiac Rhythm: regular rhythm and normal rate  Cardiac Auscultation: S1, S2 normal, no rub, no gallop  Murmurs: 2/6 systolic murmur at the right upper sternal border  Carotid Arteries: normal carotid upstroke bilaterally, no bruit  Abdominal Aorta: no abdominal aortic bruit  Lower Extremity Edema: no lower extremity edema  Abdominal Exam: soft, non-tender, no masses, bowel sounds normal  Liver & Spleen: no organomegaly  Gait & Station: normal balance and gait  Muscle Strength: normal strength and tone  Back: normal alignment and mobility, no deformity Orientation: oriented to time, place and person  Affect & Mood: appropriate and sustained affect  Language and Memory: patient responsive and seems to comprehend information  Neurologic Exam: neurological assessment grossly intact      Cardiovascular Studies      Problems Addressed Today  Encounter Diagnoses   Name Primary?   ? Essential hypertension Yes   ? Pure hypercholesterolemia    ? Coronary artery disease involving native coronary artery of native heart without angina pectoris    ? Nonrheumatic aortic valve stenosis    ?  PAD (peripheral artery disease) (HCC)    ? Chronic diastolic (congestive) heart failure (HCC)    ? Cardiomyopathy, unspecified type (HCC)    ? 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 Bilateral carotid artery stenosis    ? Left anterior fascicular block    ? Acute on chronic systolic heart failure, NYHA class 2 (HCC)    ? S/P TAVR (transcatheter aortic valve replacement)    ? Other acute pulmonary embolism without acute cor pulmonale (HCC)    ? History of left-sided carotid endarterectomy    ? Tobacco abuse    ? Stage 3 chronic kidney disease, unspecified whether stage 3a or 3b CKD (HCC)    ? 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 History of renal artery stenosis    ? 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    Overall, this patient does appear to have a somewhat poor functional status due to age, generalized weakness, lack of physical activity and sedentary lifestyle as well as previous stroke and all other comorbid conditions.    Plan: 1.  Continue all current cardiac medications  2.  I did recommend physical therapy before considering to move back to his apartment and live independently, I also suggested to use a cane for ambulation  3.  Follow-up office visit in approximately 6 to 8 months.         Current Medications (including today's revisions)  ? albuterol (VENTOLIN HFA) 90 mcg/actuation inhaler Inhale two puffs by mouth into the lungs every 6 hours as needed for Wheezing or Shortness of Breath. Shake well before use.   ? albuterol 0.083% (PROVENTIL) 2.5 mg /3 mL (0.083 %) nebulizer solution Inhale 3 mL solution by nebulizer as directed twice daily.   ? amLODIPine (NORVASC) 2.5 mg tablet TAKE 1 TABLET BY MOUTH TWO TIMES A DAY   ? aspirin 325 mg tablet Take one tablet by mouth daily. Take with food.   ? atorvastatin (LIPITOR) 40 mg tablet Take one tablet by mouth daily.   ? carvediloL (COREG) 12.5 mg tablet Take one tablet by mouth twice daily. Take with food.   ? clopiDOGrel (PLAVIX) 75 mg tablet Take one tablet by mouth daily.   ? doxazosin (CARDURA) 2 mg tablet Take one tablet by mouth at bedtime daily. (Patient taking differently: Take 2 mg by mouth daily.)   ? dutasteride (AVODART) 0.5 mg PO capsule Take 0.5 mg by mouth Daily.   ? fluticasone propionate (FLOVENT HFA) 110 mcg/actuation inhaler Inhale 1 puff by mouth into the lungs twice daily.   ? food supplemt, lactose-reduced (ENSURE COMPLETE) 0.05-1.5 gram-kcal/mL liqd Take 1 Can by mouth twice daily with meals.   ? furosemide (LASIX) 20 mg tablet Take 10 mg by mouth every 48 hours.   ? levothyroxine (SYNTHROID) 112 mcg tablet Take one tablet by mouth daily 30 minutes before breakfast.   ? montelukast (SINGULAIR) 10 mg tablet Take one tablet by mouth at bedtime daily.   ? other medication Prevagen 1 tablet daily   ?  pantoprazole DR (PROTONIX) 40 mg tablet Take one tablet by mouth daily.   ? potassium chloride SR (K-DUR) 10 mEq tablet Take 10 mEq by mouth every 48 hours. Take with a meal and a full glass of water.

## 2019-05-29 ENCOUNTER — Encounter: Admit: 2019-05-29 | Discharge: 2019-05-29 | Payer: MEDICARE

## 2019-05-29 NOTE — Telephone Encounter
Called and spoke to pt's daughter.  She states that she pushed fluids yesterday for Javier Gutierrez, and his BP was still dropping today, but not as low.  It had been dropping to 70/40s, but it was only dropping to 100/70.  They are seeing the PCP this afternoon.  They will call us after the appointment to let us know if they need any additional help from our office, or if the PCP is addressing the issue.

## 2019-05-29 NOTE — Telephone Encounter
Pts daughter called last night about 5:20pm stating that her father has been having near syncopal spells.  She had spoken to someone and they suggested possible orthostatic hypotension.  She said she had been checking his blood pressure both sitting and standing.    She said when sitting, BP can be 120-130/70's and then when he stood, stystolic BP can be as low as and pt is very lightheaded.      She said they have an appointment on 5/7 with PCP.  She wanted to know if there were other things to watch out for.      We talked about hydration and she said pt was not good about drinking fluids, she will work on that with him.  I stressed safety and if things change or he passes out, or he doesn't recover from symptoms as quickly as he is now, she should get him to the emergency room for evaluation.      Note sent to [facility] nurses to follow-up with pt.

## 2019-06-05 ENCOUNTER — Encounter: Admit: 2019-06-05 | Discharge: 2019-06-05 | Payer: MEDICARE

## 2019-06-05 DIAGNOSIS — R55 Syncope and collapse: Secondary | ICD-10-CM

## 2019-06-05 DIAGNOSIS — R42 Dizziness and giddiness: Secondary | ICD-10-CM

## 2019-06-05 MED ORDER — CARVEDILOL 12.5 MG PO TAB
6.25 mg | ORAL_TABLET | Freq: Two times a day (BID) | ORAL | 1 refills | 90.00000 days | Status: DC
Start: 2019-06-05 — End: 2019-07-02

## 2019-06-05 NOTE — Telephone Encounter
Pt's daughter called today to let us know PCP dc'd amlodipine last week. Pt's BP last night was 60/40. Pt was not symptomatic with that BP. Daughter called PCP today and he recommended pt decrease coreg to 6.25 mg BID. Pt had already taken 12.5 mg coreg this am but will take 6.25 mg this evening. We talked about holding coreg this evening if BP was 60-90 systolic and to call us to let us know. Pt's BP this am was 100/65. Pt has been pushing fluids per daughter. Medications updated.

## 2019-06-25 ENCOUNTER — Encounter: Admit: 2019-06-25 | Discharge: 2019-06-25 | Payer: MEDICARE

## 2019-06-30 ENCOUNTER — Encounter: Admit: 2019-06-30 | Discharge: 2019-06-30 | Payer: MEDICARE

## 2019-07-02 ENCOUNTER — Encounter: Admit: 2019-07-02 | Discharge: 2019-07-02 | Payer: MEDICARE

## 2019-07-02 DIAGNOSIS — I951 Orthostatic hypotension: Secondary | ICD-10-CM

## 2019-07-02 DIAGNOSIS — E039 Hypothyroidism, unspecified: Secondary | ICD-10-CM

## 2019-07-02 DIAGNOSIS — Z9889 Other specified postprocedural states: Secondary | ICD-10-CM

## 2019-07-02 DIAGNOSIS — Z7409 Other reduced mobility: Secondary | ICD-10-CM

## 2019-07-02 DIAGNOSIS — E7800 Pure hypercholesterolemia, unspecified: Secondary | ICD-10-CM

## 2019-07-02 DIAGNOSIS — I2699 Other pulmonary embolism without acute cor pulmonale: Secondary | ICD-10-CM

## 2019-07-02 DIAGNOSIS — Z23 Encounter for immunization: Secondary | ICD-10-CM

## 2019-07-02 DIAGNOSIS — Z8679 Personal history of other diseases of the circulatory system: Secondary | ICD-10-CM

## 2019-07-02 DIAGNOSIS — I6523 Occlusion and stenosis of bilateral carotid arteries: Secondary | ICD-10-CM

## 2019-07-02 DIAGNOSIS — N183 Stage 3 chronic kidney disease, unspecified whether stage 3a or 3b CKD (HCC): Secondary | ICD-10-CM

## 2019-07-02 DIAGNOSIS — I444 Left anterior fascicular block: Secondary | ICD-10-CM

## 2019-07-02 DIAGNOSIS — Z720 Tobacco use: Secondary | ICD-10-CM

## 2019-07-02 DIAGNOSIS — I429 Cardiomyopathy, unspecified: Secondary | ICD-10-CM

## 2019-07-02 DIAGNOSIS — I350 Nonrheumatic aortic (valve) stenosis: Secondary | ICD-10-CM

## 2019-07-02 DIAGNOSIS — I6312 Cerebral infarction due to embolism of basilar artery: Secondary | ICD-10-CM

## 2019-07-02 DIAGNOSIS — J45909 Unspecified asthma, uncomplicated: Secondary | ICD-10-CM

## 2019-07-02 DIAGNOSIS — R635 Abnormal weight gain: Secondary | ICD-10-CM

## 2019-07-02 DIAGNOSIS — E785 Hyperlipidemia, unspecified: Secondary | ICD-10-CM

## 2019-07-02 DIAGNOSIS — M1990 Unspecified osteoarthritis, unspecified site: Secondary | ICD-10-CM

## 2019-07-02 DIAGNOSIS — I2510 Atherosclerotic heart disease of native coronary artery without angina pectoris: Secondary | ICD-10-CM

## 2019-07-02 DIAGNOSIS — I6529 Occlusion and stenosis of unspecified carotid artery: Secondary | ICD-10-CM

## 2019-07-02 DIAGNOSIS — I10 Essential (primary) hypertension: Secondary | ICD-10-CM

## 2019-07-02 DIAGNOSIS — K635 Polyp of colon: Secondary | ICD-10-CM

## 2019-07-02 DIAGNOSIS — I5023 Acute on chronic systolic (congestive) heart failure: Secondary | ICD-10-CM

## 2019-07-02 DIAGNOSIS — I7090 Unspecified atherosclerosis: Secondary | ICD-10-CM

## 2019-07-02 DIAGNOSIS — R54 Age-related physical debility: Secondary | ICD-10-CM

## 2019-07-02 DIAGNOSIS — Z952 Presence of prosthetic heart valve: Secondary | ICD-10-CM

## 2019-07-02 DIAGNOSIS — Z86711 Personal history of pulmonary embolism: Secondary | ICD-10-CM

## 2019-07-02 DIAGNOSIS — I5032 Chronic diastolic (congestive) heart failure: Secondary | ICD-10-CM

## 2019-07-02 DIAGNOSIS — R0989 Other specified symptoms and signs involving the circulatory and respiratory systems: Secondary | ICD-10-CM

## 2019-07-02 DIAGNOSIS — I739 Peripheral vascular disease, unspecified: Secondary | ICD-10-CM

## 2019-07-02 MED ORDER — DOXAZOSIN 1 MG PO TAB
1 mg | ORAL_TABLET | Freq: Every evening | ORAL | 3 refills | 30.00000 days | Status: AC
Start: 2019-07-02 — End: ?

## 2019-07-02 MED ORDER — CARVEDILOL 6.25 MG PO TAB
6.25 mg | ORAL_TABLET | Freq: Two times a day (BID) | ORAL | 3 refills | 90.00000 days | Status: AC
Start: 2019-07-02 — End: ?

## 2019-07-17 ENCOUNTER — Encounter: Admit: 2019-07-17 | Discharge: 2019-07-17 | Payer: MEDICARE

## 2019-07-17 ENCOUNTER — Ambulatory Visit: Admit: 2019-07-17 | Discharge: 2019-07-17 | Payer: MEDICARE

## 2019-07-17 DIAGNOSIS — Z9889 Other specified postprocedural states: Secondary | ICD-10-CM

## 2019-07-17 DIAGNOSIS — I6523 Occlusion and stenosis of bilateral carotid arteries: Secondary | ICD-10-CM

## 2019-07-17 DIAGNOSIS — I2510 Atherosclerotic heart disease of native coronary artery without angina pectoris: Secondary | ICD-10-CM

## 2019-07-17 DIAGNOSIS — I350 Nonrheumatic aortic (valve) stenosis: Secondary | ICD-10-CM

## 2019-07-17 DIAGNOSIS — I6312 Cerebral infarction due to embolism of basilar artery: Secondary | ICD-10-CM

## 2019-07-28 ENCOUNTER — Encounter: Admit: 2019-07-28 | Discharge: 2019-07-28 | Payer: MEDICARE

## 2019-07-28 NOTE — Telephone Encounter
Discussed results of patient's carotid artery scan with the patient's daughter, Javier Gutierrez. She states she will relay the message to the nurse. She inquired if the results from the heart monitor are available. The results are not in at this time. Told her to expect a little turn around time for those results. No other questions or concerns at this time.

## 2019-07-28 NOTE — Telephone Encounter
-----   Message from Dorris Fetch, MD sent at 07/27/2019  4:15 PM CDT -----  Please let the patient know that I have good news, the carotid artery duplex showed that the carotid arteries are open.     Thank you  ----- Message -----  From: Lessie Dings, MD  Sent: 07/17/2019   4:14 PM CDT  To: Dorris Fetch, MD

## 2019-08-17 ENCOUNTER — Encounter: Admit: 2019-08-17 | Discharge: 2019-08-17 | Payer: MEDICARE

## 2019-08-17 NOTE — Telephone Encounter
-----   Message from Dorris Fetch, MD sent at 08/14/2019  9:26 PM CDT -----  Please call this patient let me know that the Holter monitor did demonstrate some rhythm issues, but there were no longstanding or very significant arrhythmias.I suggest he continues all current medications.Thank you  ----- Message -----  From: Dorris Fetch, MD  Sent: 08/14/2019   9:25 PM CDT  To: Dorris Fetch, MD

## 2019-08-17 NOTE — Telephone Encounter
Called and discussed results with patient.  No questions at this time.  Pt will callback with any questions, concerns or problems.

## 2019-09-11 ENCOUNTER — Encounter: Admit: 2019-09-11 | Discharge: 2019-09-11 | Payer: MEDICARE

## 2019-09-11 MED ORDER — CARVEDILOL 12.5 MG PO TAB
12.5 mg | ORAL_TABLET | Freq: Two times a day (BID) | ORAL | 3 refills | 90.00000 days | Status: AC
Start: 2019-09-11 — End: ?

## 2019-09-11 NOTE — Telephone Encounter
Received call this morning from patient's daughter concerned that patient's heart rate has been elevated around 105 over the past couple of days. She states that ever since we decreased the coreg, patient has multiple day episodes of increased heart rate and then back down to his usual 70s range.  Daughter states that patient is completely asymptomatic and his pressures normally run around 140/80. Daughter states that she does notice when patient's heart rate is elevated the he is a little more fatigued and usual.    Talked to Dr. Avie Arenas about patient symptoms, she recommends increasing his coreg back up to 12.5mg  twice daily and to follow up with her at next OV 10/13/19.     Called and spoke to daughter about Dr. Letitia Neri recommendations. Daughter is agreeable to care plan. No questions at this time.

## 2019-10-13 ENCOUNTER — Encounter: Admit: 2019-10-13 | Discharge: 2019-10-13 | Payer: MEDICARE

## 2019-10-13 DIAGNOSIS — I10 Essential (primary) hypertension: Secondary | ICD-10-CM

## 2019-10-13 DIAGNOSIS — Z8679 Personal history of other diseases of the circulatory system: Secondary | ICD-10-CM

## 2019-10-13 DIAGNOSIS — I2510 Atherosclerotic heart disease of native coronary artery without angina pectoris: Secondary | ICD-10-CM

## 2019-10-13 DIAGNOSIS — R54 Age-related physical debility: Secondary | ICD-10-CM

## 2019-10-13 DIAGNOSIS — I5023 Acute on chronic systolic (congestive) heart failure: Secondary | ICD-10-CM

## 2019-10-13 DIAGNOSIS — I5032 Chronic diastolic (congestive) heart failure: Secondary | ICD-10-CM

## 2019-10-13 DIAGNOSIS — E785 Hyperlipidemia, unspecified: Secondary | ICD-10-CM

## 2019-10-13 DIAGNOSIS — J45909 Unspecified asthma, uncomplicated: Secondary | ICD-10-CM

## 2019-10-13 DIAGNOSIS — I429 Cardiomyopathy, unspecified: Secondary | ICD-10-CM

## 2019-10-13 DIAGNOSIS — I444 Left anterior fascicular block: Secondary | ICD-10-CM

## 2019-10-13 DIAGNOSIS — I350 Nonrheumatic aortic (valve) stenosis: Secondary | ICD-10-CM

## 2019-10-13 DIAGNOSIS — I6529 Occlusion and stenosis of unspecified carotid artery: Secondary | ICD-10-CM

## 2019-10-13 DIAGNOSIS — I2699 Other pulmonary embolism without acute cor pulmonale: Secondary | ICD-10-CM

## 2019-10-13 DIAGNOSIS — K635 Polyp of colon: Secondary | ICD-10-CM

## 2019-10-13 DIAGNOSIS — E039 Hypothyroidism, unspecified: Secondary | ICD-10-CM

## 2019-10-13 DIAGNOSIS — Z9889 Other specified postprocedural states: Secondary | ICD-10-CM

## 2019-10-13 DIAGNOSIS — I739 Peripheral vascular disease, unspecified: Secondary | ICD-10-CM

## 2019-10-13 DIAGNOSIS — I7090 Unspecified atherosclerosis: Secondary | ICD-10-CM

## 2019-10-13 DIAGNOSIS — M1990 Unspecified osteoarthritis, unspecified site: Secondary | ICD-10-CM

## 2019-10-13 DIAGNOSIS — I6523 Occlusion and stenosis of bilateral carotid arteries: Secondary | ICD-10-CM

## 2019-10-13 DIAGNOSIS — I6312 Cerebral infarction due to embolism of basilar artery: Secondary | ICD-10-CM

## 2019-10-13 DIAGNOSIS — Z720 Tobacco use: Secondary | ICD-10-CM

## 2019-10-13 DIAGNOSIS — Z952 Presence of prosthetic heart valve: Secondary | ICD-10-CM

## 2019-10-13 DIAGNOSIS — E7800 Pure hypercholesterolemia, unspecified: Secondary | ICD-10-CM

## 2019-10-13 MED ORDER — AMLODIPINE 2.5 MG PO TAB
2.5 mg | ORAL_TABLET | Freq: Two times a day (BID) | ORAL | 3 refills | Status: AC
Start: 2019-10-13 — End: ?

## 2019-10-13 NOTE — Progress Notes
Date of Service: 10/13/2019    Javier Gutierrez is a 79 y.o. male.       HPI     Patient is a 79 year old white male.  He does have a history of severe aortic valve stenosis.  He underwent a TAVR procedure July 2017, he received a #29 SAPIEN S3 valve.  The last echocardiogram performed in March 2021 demonstrated a normal MG =4 mmHg across the TAVR.    Patient also reported heart palpitations, dizziness and lightheadedness, he was evaluated with a 14 days Holter monitor in June 2021, he did have NSVT and perhaps AIVR the lasted 19.6 seconds, overall the the ventricular arrhythmia was low burden representing less than 1% of the total number of beats.  He did not have atrial fibrillation or atrial flutter and no high degree AV block occurred.    Patient is here today for a follow-up office visit.  His blood pressure is elevated, with the record 160/86 mmHg in the left arm and 162/86 mmHg in the right arm with a heart rate of 78 bpm.  At the last office visit in June 2021 patient's doxazosin was decreased.  I also suggested to decrease the carvedilol, however he experienced tachycardia with this and he went back to his usual dose of 12.5 mg p.o. twice daily.    Patient does have a history of CVA, this occurred in January 2020, he had 75% stenosis of the left ICA, he did undergo left carotid endarterectomy.  Intraoperatively he was found to have persistent stenosis distal to the operated area, patient did go to the IR suite for reperfusion with a left carotid stent.    He recovered well after this event.    Patient does not report symptoms of chest pain or heart palpitations.    A carotid artery duplex performed on 07/17/2019 demonstrated patency of the left ICA stent.       Vitals:    10/13/19 1551 10/13/19 1602   BP: (!) 160/86 (!) 162/86   BP Source: Arm, Left Upper Arm, Right Upper   Patient Position: Sitting Sitting   Pulse: 78    SpO2: 98%    Weight: 84.4 kg (186 lb)    Height: 1.778 m (5' 10)    PainSc: Zero Body mass index is 26.69 kg/m?[name] Kitchen     Past Medical History  Patient Active Problem List    Diagnosis Date Noted   ? Weight gain 07/02/2019   ? COVID-19 vaccine administered 07/02/2019   ? 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 History of left-sided carotid endarterectomy 06/06/2018   ? Mild persistent asthma without complication 05/21/2018   ? Acute blood loss as cause of postoperative anemia 02/24/2018   ? Dysarthria 02/21/2018   ? Decreased functional mobility and endurance 02/21/2018   ? Impaired mobility and ADLs 02/21/2018   ? Facial droop 02/21/2018   ? Left sided lacunar stroke (HCC) 02/21/2018   ? History of pneumonia 01/24/2017   ? History of pulmonary embolus (PE) 01/24/2017   ? Impaired mobility 12/12/2016   ? Pulmonary embolism (HCC) 12/12/2016   ? Necrotizing pneumonia (HCC) 12/12/2016   ? Parapneumonic effusion 11/21/2016   ? Pneumonia 11/20/2016   ? Localized edema 12/13/2015   ? 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 Chronic diastolic (congestive) heart failure (HCC) 08/02/2015   ? Stage 3 chronic kidney disease (HCC) 04/05/2015   ?  Loss of appetite 04/05/2015   ? PAD (peripheral artery disease) (HCC) 04/05/2015   ? Aortic stenosis 03/11/2015   ? Weight loss 03/10/2015   ? Frailty    ? Tobacco abuse    ? Acute on chronic systolic heart failure, NYHA class 2 (HCC)    ? Palpable abdominal aorta 01/25/2015   ? Systolic murmur of aorta 04/29/2014   ? Weight loss, unintentional 05/01/2012   ? 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 Tobacco use disorder 04/28/2010   ? Bilateral carotid artery disease (HCC) 04/28/2010   ? Cardiomyopathy (HCC) 11/29/2008     History of cardiomyopathy - left ventricular systolic function has normalized.     ? History of renal artery stenosis 11/29/2008     A history of renal artery stenosis, status post PTA to left renal artery on 05/2001.     ? Cerebral embolism with cerebral infarction (HCC) 11/29/2008     Left Lacunar Infarct  Left ICA stenosis 75%  Etiology of stroke: small vessel   - MRI with acute infract at junction of lentiform nucleus + post limb of internal capsule  - CTA h+n notable for L ICA stenosis (75%), R ICA stenosis (50%), right vert occlusion  - LDL 129   - A1c- 5.4  - ECHO- EF 55%, Grad 2 LV diastolic dysfunction. No shunt, no thrombus. LA 3.69cm.   - s/p right CEA and  Right ICA stent placement on 1/27.     ? Hypertension 11/29/2008   ? Hyperlipidemia 11/29/2008   ? CAD (coronary artery disease) 11/29/2008     03/11/15: Cardiac cath ( via Rt radial approach), 30%pLAD, 40%mLAD, 50-60%mLcx, 30-40%pRCA, 30%mRCA followed by 30% distal  - aggressive medial therapy for CAD.     ? H/O 11/29/2008         Review of Systems   Constitutional: Negative.   HENT: Negative.    Eyes: Negative.    Cardiovascular: Positive for leg swelling.   Respiratory: Negative.    Endocrine: Negative.    Hematologic/Lymphatic: Negative.    Skin: Negative.    Musculoskeletal: Negative.    Gastrointestinal: Negative.    Genitourinary: Negative.    Neurological: Negative.    Psychiatric/Behavioral: Negative.    Allergic/Immunologic: Negative.        Physical Exam  General Appearance: normal in appearance  Skin: warm, moist, no ulcers or xanthomas  Eyes: conjunctivae and lids normal, pupils are equal and round  Lips & Oral Mucosa: no pallor or cyanosis  Neck Veins: neck veins are flat, neck veins are not distended  Chest Inspection: chest is normal in appearance  Respiratory Effort: breathing comfortably, no respiratory distress  Auscultation/Percussion: lungs clear to auscultation, no rales or rhonchi, no wheezing  Cardiac Rhythm: regular rhythm and normal rate  Cardiac Auscultation: S1, S2 normal, no rub, no gallop  Murmurs: no murmur  Carotid Arteries: normal carotid upstroke bilaterally, left carotid endarterectomy  Lower Extremity Edema: no lower extremity edema  Abdominal Exam: soft, non-tender, no masses, bowel sounds normal  Liver & Spleen: no organomegaly  Language and Memory: patient responsive and seems to comprehend information  Neurologic Exam: neurological assessment grossly intact      Cardiovascular Studies      Problems Addressed Today  Encounter Diagnoses   Name Primary?   ? Acute on chronic systolic heart failure, NYHA class 2 (HCC) Yes   ? Nonrheumatic aortic valve stenosis    ? Bilateral carotid artery stenosis    ? Coronary artery  disease involving native coronary artery of native heart without angina pectoris    ? Cardiomyopathy, unspecified type (HCC)    ? 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 Chronic diastolic (congestive) heart failure (HCC)    ? History of left-sided carotid endarterectomy    ? Pure hypercholesterolemia    ? Essential hypertension    ? Left anterior fascicular block    ? PAD (peripheral artery disease) (HCC)    ? Other acute pulmonary embolism without acute cor pulmonale (HCC)    ? S/P TAVR (transcatheter aortic valve replacement)        Assessment and Plan     In summary: This is a 79 year old white male with a history of hypertension, previous hypotension due to perhaps overmedication with antihypertensive drug therapy, history of severe arctic valve stenosis now status post TAVR in March 2021, history of CVA, s/p left carotid endarterectomy and stent placement, nonobstructive coronary artery disease, severe MAC and subsequent mild mitral valve stenosis with an MG =4 mmHg.    Plan:    1.  Continue current medications  2.  Add amlodipine 2.5 mg p.o. twice daily  3.  I did suggest physical therapy and continued physical activity  4.  Follow-up office visit in 6 months         Current Medications (including today's revisions)  ? albuterol (VENTOLIN HFA) 90 mcg/actuation inhaler Inhale two puffs by mouth into the lungs every 6 hours as needed for Wheezing or Shortness of Breath. Shake well before use.   ? albuterol 0.083% (PROVENTIL) 2.5 mg /3 mL (0.083 %) nebulizer solution Inhale 3 mL solution by nebulizer as directed twice daily as needed.   ? aspirin EC 81 mg tablet Take 81 mg by mouth daily. Take with food.   ? atorvastatin (LIPITOR) 40 mg tablet Take one tablet by mouth daily.   ? carvediloL (COREG) 12.5 mg tablet Take one tablet by mouth twice daily. Take with food.   ? clopiDOGrel (PLAVIX) 75 mg tablet Take one tablet by mouth daily.   ? doxazosin (CARDURA) 1 mg tablet Take one tablet by mouth at bedtime daily.   ? dutasteride (AVODART) 0.5 mg PO capsule Take 0.5 mg by mouth Daily.   ? fluticasone propionate (FLOVENT HFA) 110 mcg/actuation inhaler Inhale 1 puff by mouth into the lungs twice daily.   ? food supplemt, lactose-reduced (ENSURE COMPLETE) 0.05-1.5 gram-kcal/mL liqd Take 1 Can by mouth twice daily with meals.   ? levothyroxine (SYNTHROID) 100 mcg tablet Take 100 mcg by mouth daily 30 minutes before breakfast.   ? montelukast (SINGULAIR) 10 mg tablet Take one tablet by mouth at bedtime daily.   ? MYRBETRIQ 25 mg tablet Take 25 mg by mouth daily.   ? other medication Prevagen 1 tablet daily   ? pantoprazole DR (PROTONIX) 40 mg tablet Take one tablet by mouth daily.

## 2019-12-31 IMAGING — CT BRAIN WO(Adult)
3 of 4 series · 14 of 47 positions shown, 16 images · non-contrast
Comparison: none

[Series 602: brain ax 5.00 hr40 s3 · axial · 0.35mm/px · z∈[-546,-411]mm · 8 of 33 slices shown, 10 images]
[im 3/33  brain]
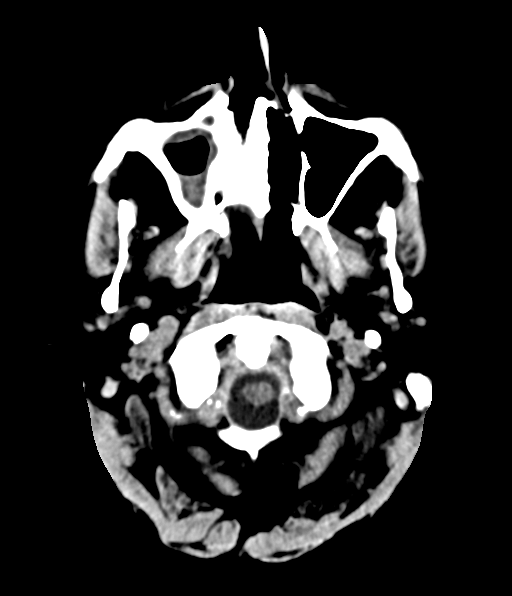
[im 3/33  bone]
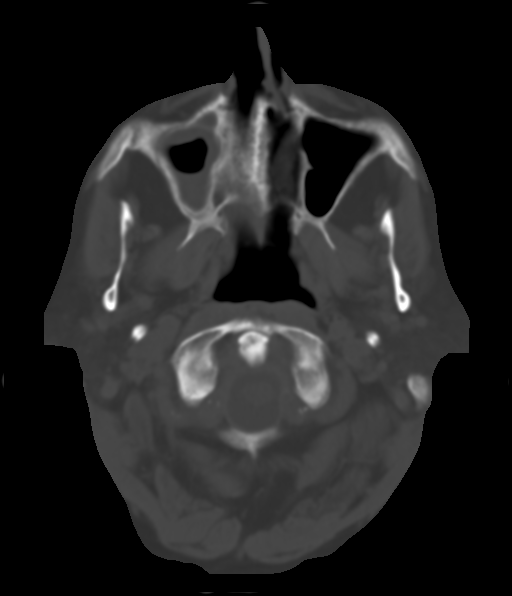
[im 7/33  brain]
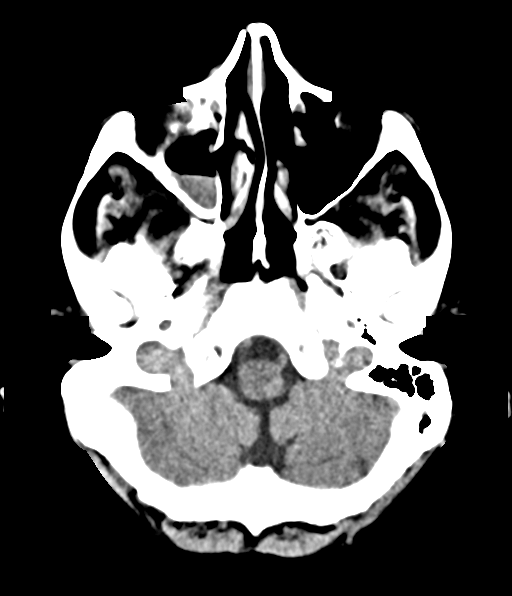
[im 12/33  brain]
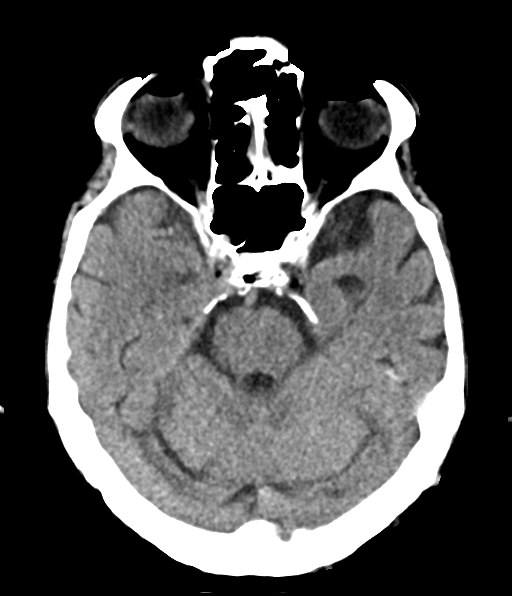
[im 14/33  brain]
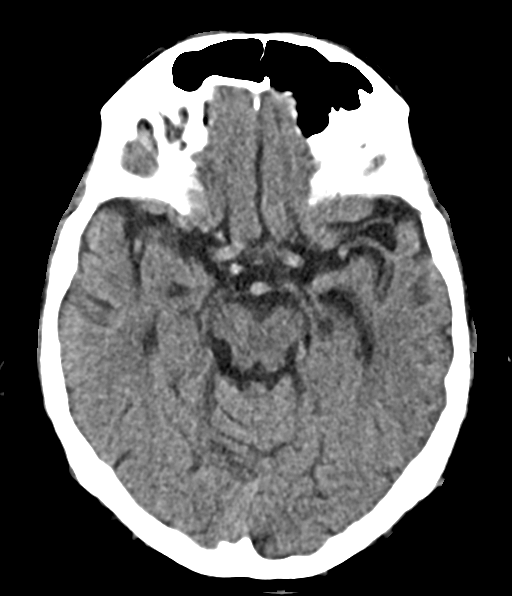
[im 19/33  brain]
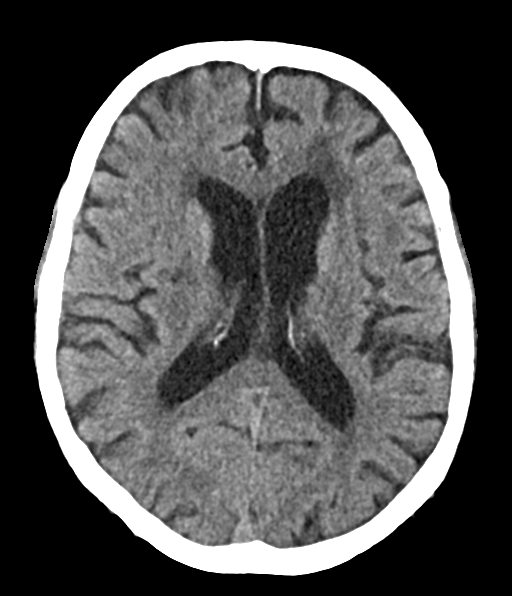
[im 19/33  bone]
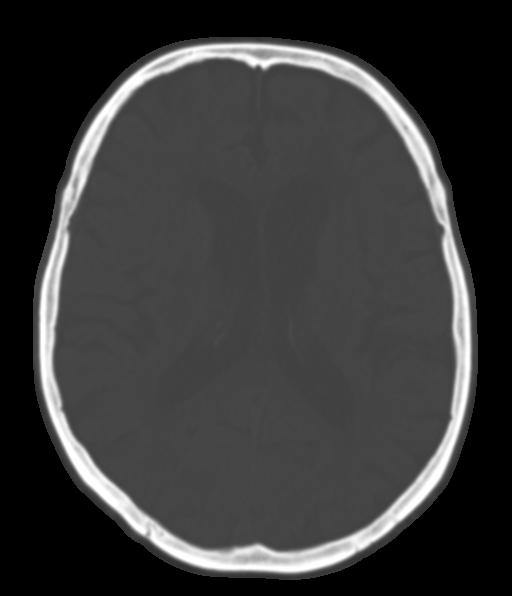
[im 21/33  brain]
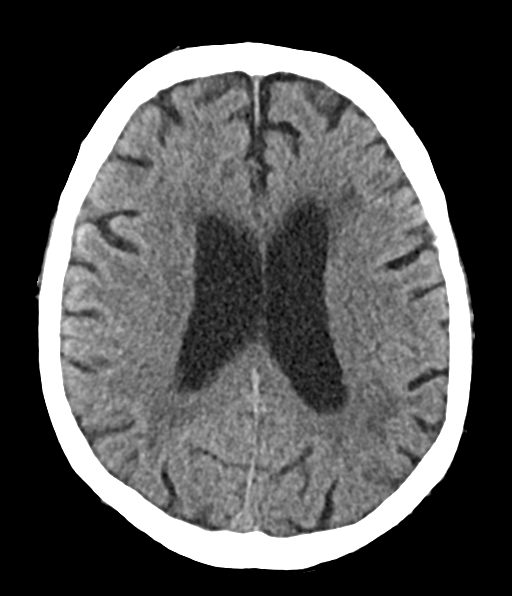
[im 26/33  brain]
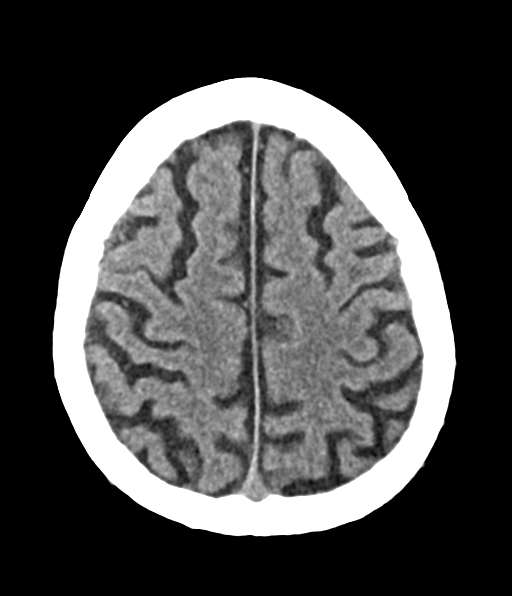
[im 30/33  brain]
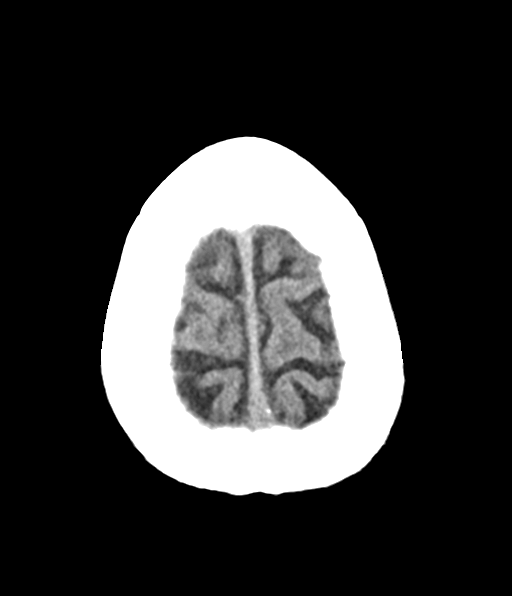

[Series 603: brain cor 5.00 hr40 s3 · coronal · 0.33mm/px · 3 of 41 slices shown]
[im 14/41  brain]
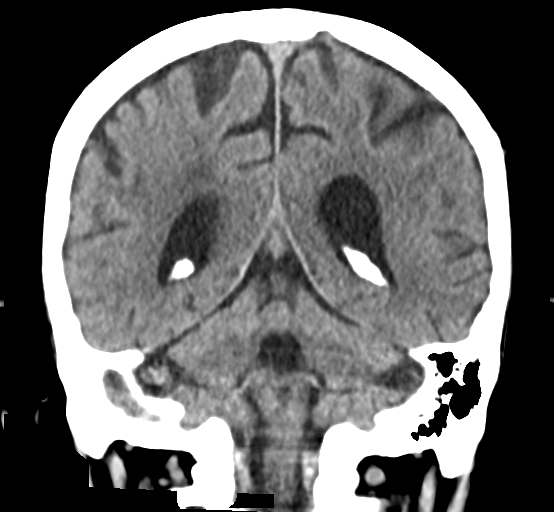
[im 18/41  brain]
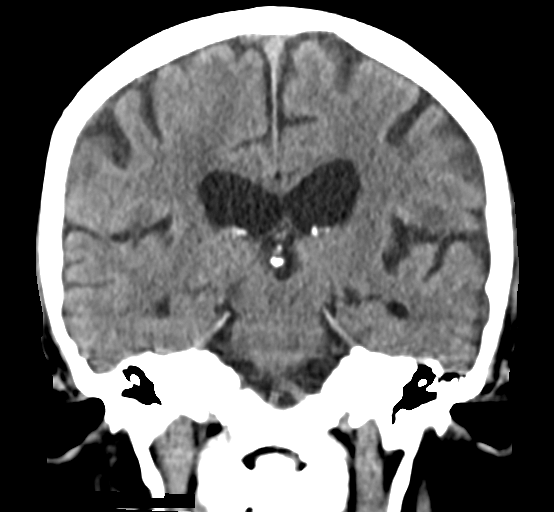
[im 23/41  brain]
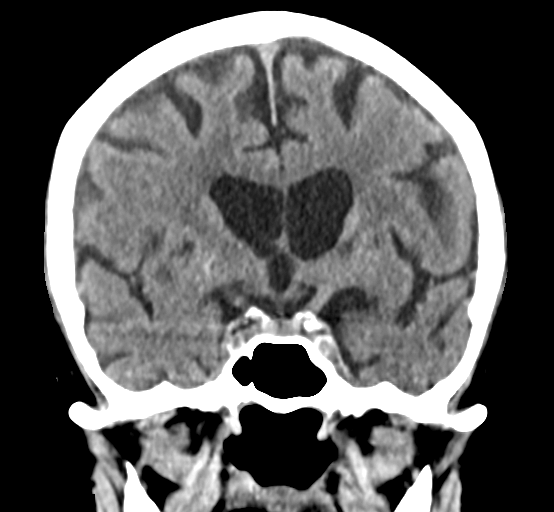

[Series 604: brain sag 5.00 hr40 s3 · sagittal · 0.33mm/px · 3 of 36 slices shown]
[im 12/36  brain]
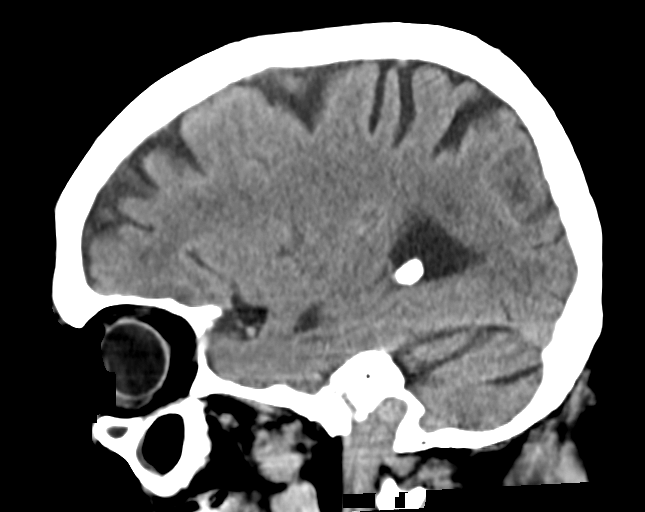
[im 18/36  brain]
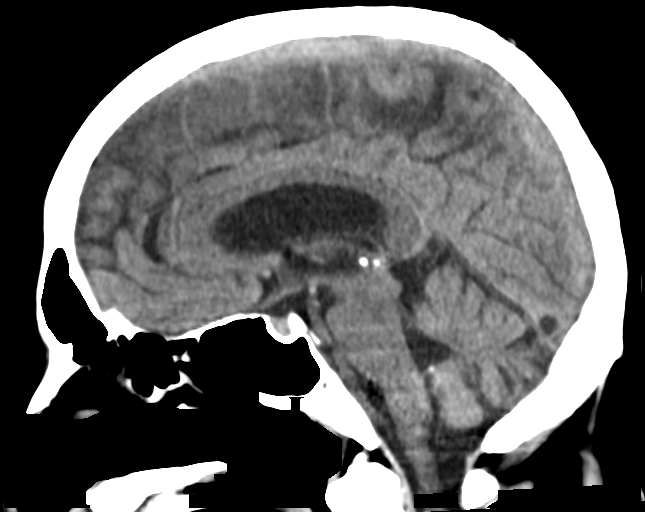
[im 24/36  brain]
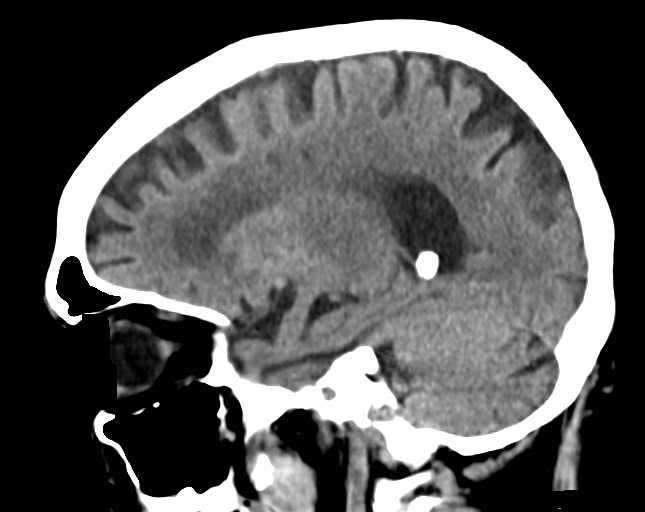

[14 of 47 positions shown; findings below may reference images not displayed]

EXAM

COMPUTED TOMOGRAPHY, HEAD OR BRAIN; WITHOUT CONTRAST MATERIAL; CPT 36786

INDICATION

slurred speech, facial droop
facial droop. slurred speech x this morning. tj

TECHNIQUE

Noncontrast head CT was performed.All CT scans at this facility use dose modulation, iterative
reconstruction, and/or weight based dosing when appropriate to reduce radiation dose to as low as
reasonably achievable. No CT scans or myocardial perfusion scans in the past year.

COMPARISONS

No prior studies are available for comparison.

FINDINGS

No evidence for acute infarct, mass, hemorrhage, or midline shift. No extra-axial fluid
collections. Moderate chronic small vessel ischemic and involutional changes for age. Gray/white
matter differentiation is otherwise preserved. Lateral ventricles are symmetric and proportional
with other CSF spaces. Basal ganglion calcifications. Bilateral cataract surgeries. There is
layering fluid and mucosal thickening in the right maxillary sinus. Scattered opacification of the
ethmoid air cells. Intracranial vascular calcifications.

IMPRESSION

1. No acute intracranial abnormality.

2. Paranasal sinusitis. Chronic small vessel ischemic change and volume loss is evident.

Tech Notes:

facial droop. slurred speech x this morning. tj

## 2020-03-22 ENCOUNTER — Encounter: Admit: 2020-03-22 | Discharge: 2020-03-22 | Payer: MEDICARE

## 2020-03-22 ENCOUNTER — Emergency Department: Admit: 2020-03-22 | Discharge: 2020-03-22 | Payer: MEDICARE

## 2020-03-22 LAB — CBC AND DIFF
Lab: 0.1 K/UL (ref 0–0.20)
Lab: 0.5 K/UL — ABNORMAL HIGH (ref 0–0.45)
Lab: 1.2 K/UL — ABNORMAL HIGH (ref 0–0.80)
Lab: 10 K/UL (ref 4.5–11.0)
Lab: 11 g/dL — ABNORMAL LOW (ref 13.5–16.5)
Lab: 18 (ref <20.7)
Lab: 207 K/UL (ref 150–400)
Lab: 36 % — ABNORMAL LOW (ref 40–50)
Lab: 5 % (ref 0–5)
Lab: 78 FL — ABNORMAL LOW (ref 80–100)

## 2020-03-22 LAB — COMPREHENSIVE METABOLIC PANEL
Lab: 0.6 mg/dL (ref 0.3–1.2)
Lab: 1.7 mg/dL — ABNORMAL HIGH (ref 0.4–1.24)
Lab: 10 U/L (ref 7–56)
Lab: 140 MMOL/L (ref 137–147)
Lab: 16 U/L (ref 7–40)
Lab: 25 mg/dL — ABNORMAL LOW (ref 7–25)
Lab: 3.4 g/dL — ABNORMAL LOW (ref 3.5–5.0)
Lab: 40 mL/min — ABNORMAL LOW (ref >60)
Lab: 64 U/L — ABNORMAL LOW (ref 25–110)
Lab: 7 K/UL (ref 3–12)
Lab: 8.8 mg/dL — ABNORMAL HIGH (ref 8.5–10.6)

## 2020-03-22 LAB — POC LACTATE: Lab: 0.7 MMOL/L (ref 0.5–2.0)

## 2020-03-22 MED ORDER — AMLODIPINE 5 MG PO TAB
2.5 mg | Freq: Once | ORAL | 0 refills | Status: AC
Start: 2020-03-22 — End: ?

## 2020-03-22 MED ORDER — CARVEDILOL 12.5 MG PO TAB
12.5 mg | Freq: Once | ORAL | 0 refills | Status: AC
Start: 2020-03-22 — End: ?
  Administered 2020-03-23: 06:00:00 12.5 mg via ORAL

## 2020-03-23 ENCOUNTER — Emergency Department: Admit: 2020-03-23 | Discharge: 2020-03-22 | Payer: MEDICARE

## 2020-03-23 NOTE — ED Notes
80 yr old male arrives to ED with CC of right ankle swelling. Pt states that tonight around 630 pm he noticed that his right ankle was swollen ans red. Ankle is slightly warm to the touch. Pt denies pain to the ankle, injuries and trauma. Pt has history of blood clots. Pt takes baby aspirin and Plavix daily. Pt A&Ox4, skin w/d/i, eupenic on RA with bed lowered, side rails up x 2 and call light within reach.

## 2020-04-21 ENCOUNTER — Encounter: Admit: 2020-04-21 | Discharge: 2020-04-21 | Payer: MEDICARE

## 2020-04-21 DIAGNOSIS — Z720 Tobacco use: Secondary | ICD-10-CM

## 2020-04-21 DIAGNOSIS — I7090 Unspecified atherosclerosis: Secondary | ICD-10-CM

## 2020-04-21 DIAGNOSIS — Z8679 Personal history of other diseases of the circulatory system: Secondary | ICD-10-CM

## 2020-04-21 DIAGNOSIS — I10 Essential (primary) hypertension: Secondary | ICD-10-CM

## 2020-04-21 DIAGNOSIS — I350 Nonrheumatic aortic (valve) stenosis: Secondary | ICD-10-CM

## 2020-04-21 DIAGNOSIS — Z9889 Other specified postprocedural states: Secondary | ICD-10-CM

## 2020-04-21 DIAGNOSIS — Z952 Presence of prosthetic heart valve: Secondary | ICD-10-CM

## 2020-04-21 DIAGNOSIS — E039 Hypothyroidism, unspecified: Secondary | ICD-10-CM

## 2020-04-21 DIAGNOSIS — I472 Ventricular tachycardia: Secondary | ICD-10-CM

## 2020-04-21 DIAGNOSIS — I6312 Cerebral infarction due to embolism of basilar artery: Secondary | ICD-10-CM

## 2020-04-21 DIAGNOSIS — I6529 Occlusion and stenosis of unspecified carotid artery: Secondary | ICD-10-CM

## 2020-04-21 DIAGNOSIS — M1990 Unspecified osteoarthritis, unspecified site: Secondary | ICD-10-CM

## 2020-04-21 DIAGNOSIS — I2510 Atherosclerotic heart disease of native coronary artery without angina pectoris: Secondary | ICD-10-CM

## 2020-04-21 DIAGNOSIS — I6523 Occlusion and stenosis of bilateral carotid arteries: Secondary | ICD-10-CM

## 2020-04-21 DIAGNOSIS — I739 Peripheral vascular disease, unspecified: Secondary | ICD-10-CM

## 2020-04-21 DIAGNOSIS — I2699 Other pulmonary embolism without acute cor pulmonale: Secondary | ICD-10-CM

## 2020-04-21 DIAGNOSIS — I5032 Chronic diastolic (congestive) heart failure: Secondary | ICD-10-CM

## 2020-04-21 DIAGNOSIS — K635 Polyp of colon: Secondary | ICD-10-CM

## 2020-04-21 DIAGNOSIS — I429 Cardiomyopathy, unspecified: Secondary | ICD-10-CM

## 2020-04-21 DIAGNOSIS — I5023 Acute on chronic systolic (congestive) heart failure: Secondary | ICD-10-CM

## 2020-04-21 DIAGNOSIS — J45909 Unspecified asthma, uncomplicated: Secondary | ICD-10-CM

## 2020-04-21 DIAGNOSIS — I6501 Occlusion and stenosis of right vertebral artery: Secondary | ICD-10-CM

## 2020-04-21 DIAGNOSIS — E7800 Pure hypercholesterolemia, unspecified: Secondary | ICD-10-CM

## 2020-04-21 DIAGNOSIS — R54 Age-related physical debility: Secondary | ICD-10-CM

## 2020-04-21 DIAGNOSIS — E785 Hyperlipidemia, unspecified: Secondary | ICD-10-CM

## 2020-04-21 DIAGNOSIS — I444 Left anterior fascicular block: Secondary | ICD-10-CM

## 2020-04-21 NOTE — Progress Notes
.Date of Service: 04/21/2020    Javier Gutierrez is a 80 y.o. male.       HPI     Javier Gutierrez is an 80 year old white male, he is here today for a follow-up office visit, he was last evaluated in September 2021.  This patient does have a history of severe aortic valve stenosis, he underwent Taber in July 2017 when he received a #29 SAPIEN S3 valve.  The most recent echocardiogram was performed in March 2021, it revealed a normal mean gradient of 6 mmHg across the aortic valve.    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    Patient recovered well after this incident.  At present time he does not report any symptoms of chest pain, no heart palpitations, he has not had any symptoms of angina he has not taken sublingual nitroglycerin.  Blood pressure and heart rate have remained stable.    Patient is also known to have nonobstructive coronary artery disease, the last left heart catheterization was performed in March 2070.    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  His blood pressure has been under good control.    The most recent evaluation with imaging studies was performed in June 2021:    1.  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  2.  A 14 days Holter monitor?NSR, 1 episode of NSVT occurred and perhaps a IVR, these were monomorphic arrhythmias, the episode lasted 19.5 seconds, overall low burden representing less than 1% of the total number of beats.  SVT and isolated supraventricular ectopy was also present and it represented 2.3% of the total number of beats, it was no evidence of atrial fibrillation, no atrial flutter and no high degree AV block.         Vitals:    04/21/20 0916 04/21/20 0927   BP: 122/74 128/82   BP Source: Arm, Left Upper Arm, Right Upper   Patient Position: Sitting Sitting   Pulse: 80    SpO2: 96%    Weight: 83 kg (183 lb)    Height: 177.8 cm (5' 10)    PainSc: Zero      Body mass index is 26.26 kg/m?[name] Kitchen     Past Medical History  Patient Active Problem List    Diagnosis Date Noted   ? Weight gain 07/02/2019   ? COVID-19 vaccine administered 07/02/2019   ? 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 History of left-sided carotid endarterectomy 06/06/2018   ? Mild persistent asthma without complication 05/21/2018   ? Acute blood loss as cause of postoperative anemia 02/24/2018   ? Dysarthria 02/21/2018   ? Decreased functional mobility and endurance 02/21/2018   ? Impaired mobility and ADLs 02/21/2018   ? Facial droop 02/21/2018   ?  Left sided lacunar stroke (HCC) 02/21/2018   ? History of pneumonia 01/24/2017   ? History of pulmonary embolus (PE) 01/24/2017   ? Impaired mobility 12/12/2016   ? Pulmonary embolism (HCC) 12/12/2016   ? Necrotizing pneumonia (HCC) 12/12/2016   ? Parapneumonic effusion 11/21/2016   ? Pneumonia 11/20/2016   ? Localized edema 12/13/2015   ? 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 Chronic diastolic (congestive) heart failure (HCC) 08/02/2015   ? Stage 3 chronic kidney disease (HCC) 04/05/2015   ? Loss of appetite 04/05/2015   ? PAD (peripheral artery disease) (HCC) 04/05/2015   ? Aortic stenosis 03/11/2015   ? Weight loss 03/10/2015   ? Frailty    ? Tobacco abuse    ? Acute on chronic systolic heart failure, NYHA class 2 (HCC)    ? Palpable abdominal aorta 01/25/2015   ? Systolic murmur of aorta 04/29/2014   ? Weight loss, unintentional 05/01/2012   ? 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 Tobacco use disorder 04/28/2010   ? Bilateral carotid artery disease (HCC) 04/28/2010   ? Cardiomyopathy (HCC) 11/29/2008     History of cardiomyopathy - left ventricular systolic function has normalized.     ? History of renal artery stenosis 11/29/2008     A history of renal artery stenosis, status post PTA to left renal artery on 05/2001.     ? Cerebral embolism with cerebral infarction (HCC) 11/29/2008     Left Lacunar Infarct  Left ICA stenosis 75%  Etiology of stroke: small vessel   - MRI with acute infract at junction of lentiform nucleus + post limb of internal capsule  - CTA h+n notable for L ICA stenosis (75%), R ICA stenosis (50%), right vert occlusion  - LDL 129   - A1c- 5.4  - ECHO- EF 55%, Grad 2 LV diastolic dysfunction. No shunt, no thrombus. LA 3.69cm.   - s/p right CEA and  Right ICA stent placement on 1/27.     ? Hypertension 11/29/2008   ? Hyperlipidemia 11/29/2008   ? CAD (coronary artery disease) 11/29/2008     03/11/15: Cardiac cath ( via Rt radial approach), 30%pLAD, 40%mLAD, 50-60%mLcx, 30-40%pRCA, 30%mRCA followed by 30% distal  - aggressive medial therapy for CAD.     ? H/O 11/29/2008         Review of Systems   Constitutional: Negative.   HENT: Positive for stridor.    Eyes: Negative.    Cardiovascular: Negative.    Respiratory: Positive for cough and wheezing.    Endocrine: Negative.    Hematologic/Lymphatic: Negative.    Skin: Negative.    Musculoskeletal: Negative.    Gastrointestinal: Negative.    Genitourinary: Negative.    Neurological: Negative.    Psychiatric/Behavioral: Negative.    Allergic/Immunologic: Positive for environmental allergies.       Physical Exam  General Appearance: normal in appearance  Skin: warm, moist, no ulcers or xanthomas  Eyes: conjunctivae and lids normal, pupils are equal and round  Lips & Oral Mucosa: no pallor or cyanosis  Neck Veins: neck veins are flat, neck veins are not distended  Chest Inspection: chest is normal in appearance  Respiratory Effort: breathing comfortably, no respiratory distress  Auscultation/Percussion: lungs clear to auscultation, no rales or rhonchi, no wheezing  Cardiac Rhythm: regular rhythm and normal rate  Cardiac Auscultation: S1, S2 normal, no rub, no gallop  Murmurs:II/VI systolic  murmur  Carotid Arteries: normal carotid upstroke bilaterally, L CEA  Lower Extremity Edema: no lower extremity edema  Abdominal Exam: soft, non-tender, no masses, bowel sounds normal  Liver & Spleen: no organomegaly  Language and Memory: patient responsive and seems to comprehend information  Neurologic Exam: neurological assessment grossly intact      Cardiovascular Studies      Cardiovascular Health Factors  Vitals BP Readings from Last 3 Encounters:   04/21/20 128/82   03/23/20 (!) 193/98   10/13/19 (!) 162/86     Wt Readings from Last 3 Encounters:   04/21/20 83 kg (183 lb)   10/13/19 84.4 kg (186 lb)   07/17/19 85.7 kg (189 lb)     BMI Readings from Last 3 Encounters:   04/21/20 26.26 kg/m?   10/13/19 26.69 kg/m?   07/17/19 27.12 kg/m?      Smoking Social History     Tobacco Use   Smoking Status Former Smoker   ? Packs/day: 0.00   ? Years: 60.00   ? Pack years: 0.00   ? Types: Cigarettes   ? Quit date: 02/13/2018   ? Years since quitting: 2.1   Smokeless Tobacco Never Used   Tobacco Comment    0.5-2 ppd history      Lipid Profile Cholesterol   Date Value Ref Range Status   04/02/2019 124  Final     HDL   Date Value Ref Range Status   04/02/2019 28 (L) >40 Final     LDL   Date Value Ref Range Status   04/02/2019 60  Final     Triglycerides   Date Value Ref Range Status   04/02/2019 179 (H) <150 Final      Blood Sugar Hemoglobin A1C   Date Value Ref Range Status   02/13/2018 5.4 4.0 - 6.0 % Final     Comment:     The ADA recommends that most patients with type 1 and type 2 diabetes maintain   an A1c level <7%.       Glucose   Date Value Ref Range Status   03/22/2020 84 70 - 100 MG/DL Final   16/10/9602 540 (H) 70 - 105 Final   08/21/2018 108 (H) 70 - 105 Final     Glucose, POC   Date Value Ref Range Status   12/03/2016 87 70 - 100 MG/DL Final   98/11/9145 829 (H) 70 - 100 MG/DL Final   56/21/3086 578 (H) 70 - 100 MG/DL Final          Problems Addressed Today  Encounter Diagnoses   Name Primary?   ? Primary hypertension Yes   ? Pure hypercholesterolemia    ? Coronary artery disease involving native coronary artery of native heart without angina pectoris    ? Nonrheumatic aortic valve stenosis    ? PAD (peripheral artery disease) (HCC)    ? Chronic diastolic (congestive) heart failure (HCC)    ? Cardiomyopathy, unspecified type (HCC)    ? 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 Bilateral carotid artery stenosis    ? Left anterior fascicular block    ? S/P TAVR (transcatheter aortic valve replacement)    ? Other acute pulmonary embolism without acute cor pulmonale (HCC)    ? History of left-sided carotid endarterectomy    ? NSVT (nonsustained ventricular tachycardia) (HCC)    ? Occlusion of right vertebral artery        Assessment and Plan     In summary: This is an 80 year old white  male that presents with the following cardiovascular/clinical issues:    1.  History of CVA?this occurred in July 2020, patient did recover well after this event  2.  History of severe left carotid artery stenosis?patient did undergo a left carotid endarterectomy followed by stent placement.  Patient also has a known history of occluded right vertebral artery  3.  PAD, history of left renal artery stenosis, s/p PTA in May 2003?patient's blood pressure has remained under good control has not had any recurrent symptoms  4.  Severe aortic valve stenosis  5.  Status post TAVR in March 2021  6.  Nonobstructive coronary artery disease?patient did undergo an LHC preceding Taber  7. Primary hypertension?under good control  8.  Hyperlipidemia?patient is on atorvastatin  9.  Generalized weakness and physical deconditioning.    Plan:    1.  Continue all current medications  2.  I encouraged regular physical activity and also consider PT for overall muscle strengthening  3.  Further evaluation with a 2D echo Doppler study and carotid artery duplex in approximately 3 to 4 months  4.  Follow-up office visit with me in October?November 2022.      Total Time Today was 30 minutes in the following activities: Preparing to see the patient, Obtaining and/or reviewing separately obtained history, Performing a medically appropriate examination and/or evaluation, Counseling and educating the patient/family/caregiver, Ordering medications, tests, or procedures, Referring and communication with other health care professionals (when not separately reported), Documenting clinical information in the electronic or other health record, Independently interpreting results (not separately reported) and communicating results to the patient/family/caregiver and Care coordination (not separately reported)         Current Medications (including today's revisions)  ? albuterol (VENTOLIN HFA) 90 mcg/actuation inhaler Inhale two puffs by mouth into the lungs every 6 hours as needed for Wheezing or Shortness of Breath. Shake well before use.   ? albuterol 0.083% (PROVENTIL) 2.5 mg /3 mL (0.083 %) nebulizer solution Inhale 3 mL solution by nebulizer as directed twice daily as needed.   ? aspirin EC 81 mg tablet Take 81 mg by mouth daily. Take with food.   ? atorvastatin (LIPITOR) 40 mg tablet Take one tablet by mouth daily.   ? carvediloL (COREG) 12.5 mg tablet Take one tablet by mouth twice daily. Take with food.   ? clopiDOGrel (PLAVIX) 75 mg tablet Take one tablet by mouth daily.   ? doxazosin (CARDURA) 1 mg tablet Take one tablet by mouth at bedtime daily.   ? dutasteride (AVODART) 0.5 mg PO capsule Take 0.5 mg by mouth Daily.   ? fluticasone propionate (FLOVENT HFA) 110 mcg/actuation inhaler Inhale 1 puff by mouth into the lungs twice daily.   ? food supplemt, lactose-reduced (ENSURE COMPLETE) 0.05-1.5 gram-kcal/mL liqd Take 1 Can by mouth twice daily with meals.   ? levothyroxine (SYNTHROID) 100 mcg tablet Take 100 mcg by mouth daily 30 minutes before breakfast.   ? montelukast (SINGULAIR) 10 mg tablet Take one tablet by mouth at bedtime daily.   ? MYRBETRIQ 25 mg tablet Take 25 mg by mouth daily.   ? other medication Prevagen 1 tablet daily   ? pantoprazole DR (PROTONIX) 40 mg tablet Take one tablet by mouth daily.

## 2020-06-04 ENCOUNTER — Encounter: Admit: 2020-06-04 | Discharge: 2020-06-04 | Payer: MEDICARE

## 2020-06-16 ENCOUNTER — Encounter: Admit: 2020-06-16 | Discharge: 2020-06-16 | Payer: MEDICARE

## 2020-07-26 ENCOUNTER — Ambulatory Visit: Admit: 2020-07-26 | Discharge: 2020-07-26 | Payer: MEDICARE

## 2020-07-26 ENCOUNTER — Encounter: Admit: 2020-07-26 | Discharge: 2020-07-26 | Payer: MEDICARE

## 2020-09-01 ENCOUNTER — Encounter: Admit: 2020-09-01 | Discharge: 2020-09-01 | Payer: MEDICARE

## 2020-09-01 MED ORDER — DOXAZOSIN 1 MG PO TAB
ORAL_TABLET | Freq: Every day | ORAL | 3 refills | 30.00000 days | Status: AC
Start: 2020-09-01 — End: ?

## 2020-09-21 ENCOUNTER — Encounter: Admit: 2020-09-21 | Discharge: 2020-09-21 | Payer: MEDICARE

## 2020-09-21 ENCOUNTER — Ambulatory Visit: Admit: 2020-09-21 | Discharge: 2020-09-21 | Payer: MEDICARE

## 2020-09-21 DIAGNOSIS — Z952 Presence of prosthetic heart valve: Secondary | ICD-10-CM

## 2020-09-21 DIAGNOSIS — I6312 Cerebral infarction due to embolism of basilar artery: Secondary | ICD-10-CM

## 2020-09-21 DIAGNOSIS — E7800 Pure hypercholesterolemia, unspecified: Secondary | ICD-10-CM

## 2020-09-21 DIAGNOSIS — I350 Nonrheumatic aortic (valve) stenosis: Secondary | ICD-10-CM

## 2020-09-21 DIAGNOSIS — I2510 Atherosclerotic heart disease of native coronary artery without angina pectoris: Secondary | ICD-10-CM

## 2020-09-21 DIAGNOSIS — I10 Essential (primary) hypertension: Secondary | ICD-10-CM

## 2020-09-21 DIAGNOSIS — I6523 Occlusion and stenosis of bilateral carotid arteries: Secondary | ICD-10-CM

## 2020-09-21 DIAGNOSIS — Z9889 Other specified postprocedural states: Secondary | ICD-10-CM

## 2020-09-21 DIAGNOSIS — I429 Cardiomyopathy, unspecified: Secondary | ICD-10-CM

## 2020-09-21 DIAGNOSIS — I5032 Chronic diastolic (congestive) heart failure: Secondary | ICD-10-CM

## 2020-09-21 MED ORDER — PERFLUTREN LIPID MICROSPHERES 1.1 MG/ML IV SUSP
1-10 mL | Freq: Once | INTRAVENOUS | 0 refills | Status: CP | PRN
Start: 2020-09-21 — End: ?

## 2020-09-27 ENCOUNTER — Encounter: Admit: 2020-09-27 | Discharge: 2020-09-27 | Payer: MEDICARE

## 2020-09-27 NOTE — Telephone Encounter
-----   Message from Marina N Hannen, MD sent at 09/25/2020  6:35 PM CDT -----  Please tell the patient that the echocardiogram showed normal function of Javier Gutierrez, there is mild to moderate tightening of the other valves on the left side, please let him know of these findings.  We will continue to monitor.  I can see him back in the office in 6 months from the last office visit.    Thank you      ----- Message -----  From: Gupta, Bhanu P, MD  Sent: 09/23/2020   5:18 PM CDT  To: Marina N Hannen, MD

## 2020-09-27 NOTE — Telephone Encounter
Results and recommendations called to patient's dtr.

## 2020-09-27 NOTE — Telephone Encounter
Results and recommendations called to patient's dtr.

## 2020-09-27 NOTE — Telephone Encounter
-----   Message from Dorris Fetch, MD sent at 09/25/2020  6:35 PM CDT -----  Please tell the patient that the echocardiogram showed normal function of Taber, there is mild to moderate tightening of the other valves on the left side, please let him know of these findings.  We will continue to monitor.  I can see him back in the office in 6 months from the last office visit.    Thank you      ----- Message -----  From: Skipper Cliche, MD  Sent: 09/23/2020   5:18 PM CDT  To: Dorris Fetch, MD

## 2020-09-27 NOTE — Telephone Encounter
-----   Message from Marina N Hannen, MD sent at 09/25/2020  6:35 PM CDT -----  Please tell the patient that the echocardiogram showed normal function of Taber, there is mild to moderate tightening of the other valves on the left side, please let him know of these findings.  We will continue to monitor.  I can see him back in the office in 6 months from the last office visit.    Thank you      ----- Message -----  From: Gupta, Bhanu P, MD  Sent: 09/23/2020   5:18 PM CDT  To: Marina N Hannen, MD

## 2021-05-12 ENCOUNTER — Ambulatory Visit: Admit: 2021-05-12 | Discharge: 2021-05-12 | Payer: MEDICARE

## 2021-05-12 ENCOUNTER — Encounter: Admit: 2021-05-12 | Discharge: 2021-05-12 | Payer: MEDICARE

## 2021-05-12 DIAGNOSIS — R0902 Hypoxemia: Secondary | ICD-10-CM

## 2021-05-12 DIAGNOSIS — R000 Tachycardia, unspecified: Secondary | ICD-10-CM

## 2021-05-18 ENCOUNTER — Emergency Department: Admit: 2021-05-18 | Discharge: 2021-05-18 | Payer: MEDICARE

## 2021-05-18 ENCOUNTER — Encounter: Admit: 2021-05-18 | Discharge: 2021-05-18 | Payer: MEDICARE

## 2021-05-18 ENCOUNTER — Inpatient Hospital Stay: Admit: 2021-05-18 | Payer: MEDICARE

## 2021-05-18 DIAGNOSIS — E785 Hyperlipidemia, unspecified: Secondary | ICD-10-CM

## 2021-05-18 DIAGNOSIS — I10 Essential (primary) hypertension: Secondary | ICD-10-CM

## 2021-05-18 DIAGNOSIS — E039 Hypothyroidism, unspecified: Secondary | ICD-10-CM

## 2021-05-18 DIAGNOSIS — R54 Age-related physical debility: Secondary | ICD-10-CM

## 2021-05-18 DIAGNOSIS — M1990 Unspecified osteoarthritis, unspecified site: Secondary | ICD-10-CM

## 2021-05-18 DIAGNOSIS — I7090 Unspecified atherosclerosis: Secondary | ICD-10-CM

## 2021-05-18 DIAGNOSIS — Z8679 Personal history of other diseases of the circulatory system: Secondary | ICD-10-CM

## 2021-05-18 DIAGNOSIS — K635 Polyp of colon: Secondary | ICD-10-CM

## 2021-05-18 DIAGNOSIS — I5023 Acute on chronic systolic (congestive) heart failure: Secondary | ICD-10-CM

## 2021-05-18 DIAGNOSIS — Z720 Tobacco use: Secondary | ICD-10-CM

## 2021-05-18 DIAGNOSIS — I2510 Atherosclerotic heart disease of native coronary artery without angina pectoris: Secondary | ICD-10-CM

## 2021-05-18 DIAGNOSIS — I429 Cardiomyopathy, unspecified: Secondary | ICD-10-CM

## 2021-05-18 DIAGNOSIS — I6529 Occlusion and stenosis of unspecified carotid artery: Secondary | ICD-10-CM

## 2021-05-18 DIAGNOSIS — I350 Nonrheumatic aortic (valve) stenosis: Secondary | ICD-10-CM

## 2021-05-18 DIAGNOSIS — R5381 Other malaise: Secondary | ICD-10-CM

## 2021-05-18 DIAGNOSIS — J45909 Unspecified asthma, uncomplicated: Secondary | ICD-10-CM

## 2021-05-18 LAB — CBC AND DIFF
ABSOLUTE BASO COUNT: 0 K/UL (ref 0–0.20)
ABSOLUTE EOS COUNT: 0.2 K/UL (ref 0–0.45)
ABSOLUTE MONO COUNT: 1.3 K/UL — ABNORMAL HIGH (ref 0–0.80)
HEMATOCRIT: 39 % — ABNORMAL LOW (ref 40–50)
MCV: 76 FL — ABNORMAL LOW (ref <20)
MDW (MONOCYTE DISTRIBUTION WIDTH): 20 — ABNORMAL HIGH (ref <20.7)
MPV: 8.3 FL (ref 7–11)
NEUTROPHILS %: 80 % — ABNORMAL HIGH (ref 41–77)
WBC COUNT: 14 K/UL — ABNORMAL HIGH (ref 4.5–11.0)

## 2021-05-18 LAB — TSH WITH FREE T4 REFLEX: TSH: 1.3 uU/mL — ABNORMAL LOW (ref 0.35–5.00)

## 2021-05-18 LAB — COMPREHENSIVE METABOLIC PANEL
ALK PHOSPHATASE: 52 U/L — ABNORMAL LOW (ref 25–110)
ALT: 11 U/L (ref 7–56)
ANION GAP: 5 K/UL — ABNORMAL HIGH (ref 3–12)
AST: 11 U/L (ref 7–40)
CALCIUM: 8.5 mg/dL — ABNORMAL HIGH (ref 8.5–10.6)
CO2: 31 MMOL/L — ABNORMAL HIGH (ref 21–30)
EGFR: 50 mL/min — ABNORMAL LOW (ref >60)
SODIUM: 137 MMOL/L (ref 137–147)
TOTAL PROTEIN: 5.5 g/dL — ABNORMAL LOW (ref 6.0–8.0)

## 2021-05-18 LAB — D-DIMER: D-DIMER: 230 ng{FEU}/mL — ABNORMAL HIGH (ref <500)

## 2021-05-18 LAB — PROCALCITONIN: PROCALCITONIN: 0 ng/mL

## 2021-05-18 LAB — HIGH SENSITIVITY TROPONIN I 4 HR: HI SEN TNI 4 HR: 25 ng/L — ABNORMAL HIGH (ref <20)

## 2021-05-18 LAB — BNP POC ER: BNP POC: 130 pg/mL — ABNORMAL HIGH (ref 0–100)

## 2021-05-18 LAB — HIGH SENSITIVITY TROPONIN I 2 HOUR: HIGH SENSITIVITY TROPONIN I 2 HOUR: 26 ng/L — ABNORMAL HIGH (ref <20)

## 2021-05-18 MED ORDER — MONTELUKAST 10 MG PO TAB
10 mg | Freq: Every evening | ORAL | 0 refills | Status: AC
Start: 2021-05-18 — End: ?
  Administered 2021-05-20 – 2021-05-21 (×2): 10 mg via ORAL

## 2021-05-18 MED ORDER — DUTASTERIDE 0.5 MG PO CAP
.5 mg | Freq: Every day | ORAL | 0 refills | Status: AC
Start: 2021-05-18 — End: ?
  Administered 2021-05-19 – 2021-05-21 (×3): 0.5 mg via ORAL

## 2021-05-18 MED ORDER — HYDRALAZINE 20 MG/ML IJ SOLN
10 mg | Freq: Once | INTRAVENOUS | 0 refills | Status: AC
Start: 2021-05-18 — End: ?

## 2021-05-18 MED ORDER — ATORVASTATIN 40 MG PO TAB
40 mg | Freq: Every day | ORAL | 0 refills | Status: AC
Start: 2021-05-18 — End: ?
  Administered 2021-05-19 – 2021-05-21 (×3): 40 mg via ORAL

## 2021-05-18 MED ORDER — SODIUM CHLORIDE 0.9 % IJ SOLN
50 mL | Freq: Once | INTRAVENOUS | 0 refills | Status: CP
Start: 2021-05-18 — End: ?
  Administered 2021-05-18: 22:00:00 50 mL via INTRAVENOUS

## 2021-05-18 MED ORDER — CLOPIDOGREL 75 MG PO TAB
75 mg | Freq: Every day | ORAL | 0 refills | Status: AC
Start: 2021-05-18 — End: ?
  Administered 2021-05-19 – 2021-05-21 (×3): 75 mg via ORAL

## 2021-05-18 MED ORDER — AMPICILLIN/SULBACTAM 3G/100ML NS IVPB (MB+)
3 g | Freq: Once | INTRAVENOUS | 0 refills | Status: CP
Start: 2021-05-18 — End: ?
  Administered 2021-05-18 (×2): 3 g via INTRAVENOUS

## 2021-05-18 MED ORDER — ALBUTEROL SULFATE 90 MCG/ACTUATION IN HFAA
1-2 | RESPIRATORY_TRACT | 0 refills | Status: AC | PRN
Start: 2021-05-18 — End: ?

## 2021-05-18 MED ORDER — LACTATED RINGERS IV SOLP
500 mL | INTRAVENOUS | 0 refills | Status: CP
Start: 2021-05-18 — End: ?
  Administered 2021-05-18: 20:00:00 500 mL via INTRAVENOUS

## 2021-05-18 MED ORDER — ASPIRIN 81 MG PO TBEC
81 mg | Freq: Every day | ORAL | 0 refills | Status: AC
Start: 2021-05-18 — End: ?
  Administered 2021-05-19 – 2021-05-21 (×4): 81 mg via ORAL

## 2021-05-18 MED ORDER — AMPICILLIN/SULBACTAM 1.5G/100ML NS IVPB (MB+)
1.5 g | INTRAVENOUS | 0 refills | Status: AC
Start: 2021-05-18 — End: ?
  Administered 2021-05-19 – 2021-05-20 (×12): 1.5 g via INTRAVENOUS

## 2021-05-18 MED ORDER — ENOXAPARIN 40 MG/0.4 ML SC SYRG
40 mg | Freq: Every day | SUBCUTANEOUS | 0 refills | Status: AC
Start: 2021-05-18 — End: ?
  Administered 2021-05-20 – 2021-05-21 (×2): 40 mg via SUBCUTANEOUS

## 2021-05-18 MED ORDER — AMPICILLIN/SULBACTAM 1.5G/100ML NS IVPB (MB+)
1.5 g | INTRAVENOUS | 0 refills | Status: DC
Start: 2021-05-18 — End: 2021-05-18

## 2021-05-18 MED ORDER — IOPAMIDOL 370 MG IODINE /ML (76 %) IV SOLN
70 mL | Freq: Once | INTRAVENOUS | 0 refills | Status: CP
Start: 2021-05-18 — End: ?
  Administered 2021-05-18: 22:00:00 70 mL via INTRAVENOUS

## 2021-05-18 MED ORDER — LEVOTHYROXINE 100 MCG PO TAB
100 ug | Freq: Every day | ORAL | 0 refills | Status: AC
Start: 2021-05-18 — End: ?
  Administered 2021-05-19: 15:00:00 100 ug via ORAL

## 2021-05-18 MED ORDER — PANTOPRAZOLE 40 MG PO TBEC
40 mg | Freq: Every day | ORAL | 0 refills | Status: AC
Start: 2021-05-18 — End: ?
  Administered 2021-05-19 – 2021-05-21 (×3): 40 mg via ORAL

## 2021-05-18 NOTE — ED Notes
81 year old male presents to ED17 with a CC of leg swelling. Daughter reports the patient began having bilateral leg swelling this morning. Daughter reports the patient had bronchitis about one week ago and was admitted to their local hospital, reports there was abnormal findings on the Echo in regards to his mitral valve. Patient reports that he feels fine and "wants to go home". Daughter reports concerns for the swelling in the legs and feels like his behavior is a bit different today. Rhonchi and expiratory wheezes noted bilaterally throughout. Pt denies CP, SOA, N/V, diarrhea, constipation, cough, headache, urinary changes, and/or fever chills.     Pt is A&O x4, calm and cooperative. Skin is warm and dry. Respirations even and unlabored, Pt resting in bed, locked in lowest position, side rails up, call light within reach. Pt placed on BP/O2/Tele monitors upon arrival to room. VSS. Daughter at bedside.     Patient remains in possession of all belongings.

## 2021-05-19 ENCOUNTER — Inpatient Hospital Stay: Admit: 2021-05-19 | Discharge: 2021-05-19 | Payer: MEDICARE

## 2021-05-19 ENCOUNTER — Encounter: Admit: 2021-05-19 | Discharge: 2021-05-19 | Payer: MEDICARE

## 2021-05-19 DIAGNOSIS — I350 Nonrheumatic aortic (valve) stenosis: Secondary | ICD-10-CM

## 2021-05-19 DIAGNOSIS — I7090 Unspecified atherosclerosis: Secondary | ICD-10-CM

## 2021-05-19 DIAGNOSIS — Z8679 Personal history of other diseases of the circulatory system: Secondary | ICD-10-CM

## 2021-05-19 DIAGNOSIS — I10 Essential (primary) hypertension: Secondary | ICD-10-CM

## 2021-05-19 DIAGNOSIS — R54 Age-related physical debility: Secondary | ICD-10-CM

## 2021-05-19 DIAGNOSIS — E039 Hypothyroidism, unspecified: Secondary | ICD-10-CM

## 2021-05-19 DIAGNOSIS — I2510 Atherosclerotic heart disease of native coronary artery without angina pectoris: Secondary | ICD-10-CM

## 2021-05-19 DIAGNOSIS — K635 Polyp of colon: Secondary | ICD-10-CM

## 2021-05-19 DIAGNOSIS — E785 Hyperlipidemia, unspecified: Secondary | ICD-10-CM

## 2021-05-19 DIAGNOSIS — I429 Cardiomyopathy, unspecified: Secondary | ICD-10-CM

## 2021-05-19 DIAGNOSIS — J45909 Unspecified asthma, uncomplicated: Secondary | ICD-10-CM

## 2021-05-19 DIAGNOSIS — I6529 Occlusion and stenosis of unspecified carotid artery: Secondary | ICD-10-CM

## 2021-05-19 DIAGNOSIS — Z720 Tobacco use: Secondary | ICD-10-CM

## 2021-05-19 DIAGNOSIS — M1990 Unspecified osteoarthritis, unspecified site: Secondary | ICD-10-CM

## 2021-05-19 DIAGNOSIS — I5023 Acute on chronic systolic (congestive) heart failure: Secondary | ICD-10-CM

## 2021-05-19 MED ADMIN — AMLODIPINE 2.5 MG PO TAB [78889]: 2.5 mg | ORAL | @ 23:00:00 | NDC 00904636961

## 2021-05-19 MED ADMIN — CARVEDILOL 6.25 MG PO TAB [77309]: 6.25 mg | ORAL | @ 15:00:00 | NDC 00904630161

## 2021-05-19 MED ADMIN — HYDRALAZINE 20 MG/ML IJ SOLN [3697]: 10 mg | INTRAVENOUS | @ 06:00:00 | Stop: 2021-05-19 | NDC 00517090101

## 2021-05-19 MED ADMIN — SODIUM CHLORIDE 0.9 % IV SOLP [27838]: 250 mL | INTRAVENOUS | @ 05:00:00 | Stop: 2021-05-19 | NDC 00338004902

## 2021-05-20 ENCOUNTER — Encounter: Admit: 2021-05-20 | Discharge: 2021-05-20 | Payer: MEDICARE

## 2021-05-20 ENCOUNTER — Inpatient Hospital Stay: Admit: 2021-05-20 | Discharge: 2021-05-20 | Payer: MEDICARE

## 2021-05-20 MED ADMIN — FLUTICASONE FUROATE 50 MCG/ACTUATION IN DSDV [336597]: 1 | RESPIRATORY_TRACT | @ 13:00:00 | NDC 00173088810

## 2021-05-20 MED ADMIN — AMOXICILLIN-POT CLAVULANATE 875-125 MG PO TAB [33228]: 875 mg | ORAL | @ 22:00:00 | NDC 00093227534

## 2021-05-20 MED ADMIN — AMLODIPINE 2.5 MG PO TAB [78889]: 2.5 mg | ORAL | @ 15:00:00 | NDC 00904636961

## 2021-05-20 MED ADMIN — CARVEDILOL 6.25 MG PO TAB [77309]: 6.25 mg | ORAL | @ 02:00:00 | NDC 00904630161

## 2021-05-20 MED ADMIN — LEVOTHYROXINE 88 MCG PO TAB [10403]: 88 ug | ORAL | @ 12:00:00 | NDC 00904695261

## 2021-05-20 MED ADMIN — CARVEDILOL 6.25 MG PO TAB [77309]: 6.25 mg | ORAL | @ 15:00:00 | NDC 00904630161

## 2021-05-21 ENCOUNTER — Encounter: Admit: 2021-05-21 | Discharge: 2021-05-21 | Payer: MEDICARE

## 2021-05-21 ENCOUNTER — Inpatient Hospital Stay: Admit: 2021-05-21 | Discharge: 2021-05-21 | Payer: MEDICARE

## 2021-05-21 MED ADMIN — AMLODIPINE 2.5 MG PO TAB [78889]: 2.5 mg | ORAL | @ 14:00:00 | Stop: 2021-05-22 | NDC 00904636961

## 2021-05-21 MED ADMIN — LEVOTHYROXINE 88 MCG PO TAB [10403]: 88 ug | ORAL | @ 11:00:00 | Stop: 2021-05-22 | NDC 00904695261

## 2021-05-21 MED ADMIN — AMOXICILLIN-POT CLAVULANATE 875-125 MG PO TAB [33228]: 875 mg | ORAL | @ 14:00:00 | Stop: 2021-05-22 | NDC 00093227534

## 2021-05-21 MED ADMIN — CARVEDILOL 6.25 MG PO TAB [77309]: 6.25 mg | ORAL | @ 14:00:00 | Stop: 2021-05-22 | NDC 00904630161

## 2021-05-21 MED ADMIN — CARVEDILOL 6.25 MG PO TAB [77309]: 6.25 mg | ORAL | @ 02:00:00 | NDC 00904630161

## 2021-05-21 MED FILL — AMOXICILLIN-POT CLAVULANATE 875-125 MG PO TAB: 875/125 mg | ORAL | 2 days supply | Qty: 4 | Fill #1 | Status: CP

## 2021-05-21 MED FILL — FUROSEMIDE 20 MG PO TAB: 20 mg | ORAL | 90 days supply | Qty: 90 | Fill #1 | Status: CP

## 2021-05-22 ENCOUNTER — Encounter: Admit: 2021-05-22 | Discharge: 2021-05-22 | Payer: MEDICARE

## 2021-05-30 ENCOUNTER — Encounter: Admit: 2021-05-30 | Discharge: 2021-05-30 | Payer: MEDICARE

## 2021-05-30 DIAGNOSIS — Z9889 Other specified postprocedural states: Secondary | ICD-10-CM

## 2021-05-30 DIAGNOSIS — Z09 Encounter for follow-up examination after completed treatment for conditions other than malignant neoplasm: Secondary | ICD-10-CM

## 2021-05-30 DIAGNOSIS — I10 Essential (primary) hypertension: Secondary | ICD-10-CM

## 2021-05-30 DIAGNOSIS — Z720 Tobacco use: Secondary | ICD-10-CM

## 2021-05-30 DIAGNOSIS — I6312 Cerebral infarction due to embolism of basilar artery: Secondary | ICD-10-CM

## 2021-05-30 DIAGNOSIS — I6501 Occlusion and stenosis of right vertebral artery: Secondary | ICD-10-CM

## 2021-05-30 DIAGNOSIS — I5032 Chronic diastolic (congestive) heart failure: Secondary | ICD-10-CM

## 2021-05-30 DIAGNOSIS — I7090 Unspecified atherosclerosis: Secondary | ICD-10-CM

## 2021-05-30 DIAGNOSIS — Z952 Presence of prosthetic heart valve: Secondary | ICD-10-CM

## 2021-05-30 DIAGNOSIS — R54 Age-related physical debility: Secondary | ICD-10-CM

## 2021-05-30 DIAGNOSIS — E7800 Pure hypercholesterolemia, unspecified: Secondary | ICD-10-CM

## 2021-05-30 DIAGNOSIS — I429 Cardiomyopathy, unspecified: Secondary | ICD-10-CM

## 2021-05-30 DIAGNOSIS — I2510 Atherosclerotic heart disease of native coronary artery without angina pectoris: Secondary | ICD-10-CM

## 2021-05-30 DIAGNOSIS — I444 Left anterior fascicular block: Secondary | ICD-10-CM

## 2021-05-30 DIAGNOSIS — N183 Stage 3 chronic kidney disease, unspecified whether stage 3a or 3b CKD (HCC): Secondary | ICD-10-CM

## 2021-05-30 DIAGNOSIS — Z136 Encounter for screening for cardiovascular disorders: Secondary | ICD-10-CM

## 2021-05-30 DIAGNOSIS — I5023 Acute on chronic systolic (congestive) heart failure: Secondary | ICD-10-CM

## 2021-05-30 DIAGNOSIS — I6523 Occlusion and stenosis of bilateral carotid arteries: Secondary | ICD-10-CM

## 2021-05-30 DIAGNOSIS — I6529 Occlusion and stenosis of unspecified carotid artery: Secondary | ICD-10-CM

## 2021-05-30 DIAGNOSIS — I350 Nonrheumatic aortic (valve) stenosis: Secondary | ICD-10-CM

## 2021-05-30 DIAGNOSIS — K635 Polyp of colon: Secondary | ICD-10-CM

## 2021-05-30 DIAGNOSIS — Z8679 Personal history of other diseases of the circulatory system: Secondary | ICD-10-CM

## 2021-05-30 DIAGNOSIS — M1990 Unspecified osteoarthritis, unspecified site: Secondary | ICD-10-CM

## 2021-05-30 DIAGNOSIS — I739 Peripheral vascular disease, unspecified: Secondary | ICD-10-CM

## 2021-05-30 DIAGNOSIS — E039 Hypothyroidism, unspecified: Secondary | ICD-10-CM

## 2021-05-30 DIAGNOSIS — I2699 Other pulmonary embolism without acute cor pulmonale: Secondary | ICD-10-CM

## 2021-05-30 DIAGNOSIS — E785 Hyperlipidemia, unspecified: Secondary | ICD-10-CM

## 2021-05-30 DIAGNOSIS — J45909 Unspecified asthma, uncomplicated: Secondary | ICD-10-CM

## 2021-06-08 ENCOUNTER — Encounter: Admit: 2021-06-08 | Discharge: 2021-06-08 | Payer: MEDICARE

## 2021-06-08 NOTE — Progress Notes
Patient Name:  Javier Gutierrez  MRN:  [id number]  DOB:  1940-03-13  Age: 81 y.o.  Insurance:  Payor: MEDICARE / Plan: MEDICARE PART A AND B / Product Type: Medicare /     Physician Info:   ? Referring Physician:  No ref. provider found   ? PCP:  Steva Ready R     Diagnosis: PVD     Reason for Visit:  Post hospital follow-up- consulted while inpatient at [facility] for left anterior tibial artery stenosis (April 27th, 2023)    Scheduled testing:     Location of Films:  IN HOUSE    Date: Imaging: Impression:   05/19/21 Left leg arterial US See printed                    Medical History   has a past medical history of Acute on chronic systolic heart failure, NYHA class 2 (HCC), Aortic valve stenosis, mild (11/29/2008), Arterial occlusion, Arthritis, Asthma, CAD (coronary artery disease) (11/29/2008), Cardiomyopathy (HCC) (11/29/2008), Carotid artery plaque, Colon polyps, Frailty, H/O: CVA (cardiovascular accident) (11/29/2008), History of renal artery stenosis (11/29/2008), HTN, Hyperlipidemia, Hyperlipidemia (11/29/2008), Hypertension (11/29/2008), Hypothyroidism, and Tobacco abuse.          Surgical History  has a past surgical history that includes lumbar diskectomy (1968); stent intravascular (2006); Upper gastrointestinal endoscopy (N/A, 12/06/2016); bronchoscopy (Bilateral, 11/30/2016); bronchoscopy (Right, 11/30/2016); aortic valve replacement (N/A, 08/03/2015); Upper gastrointestinal endoscopy (N/A, 04/26/2015); Upper gastrointestinal endoscopy (04/26/2015); percutaneous coronary intervention (N/A, 03/11/2015); and carotid endardectomy (Left, 02/17/2018).    Allergies No Known Allergies       Medications:       ? albuterol (VENTOLIN HFA) 90 mcg/actuation inhaler Inhale two puffs by mouth into the lungs every 6 hours as needed for Wheezing or Shortness of Breath. Shake well before use.   ? albuterol 0.083% (PROVENTIL) 2.5 mg /3 mL (0.083 %) nebulizer solution Inhale 3 mL solution by nebulizer as directed twice daily as needed.   ? amLODIPine (NORVASC) 5 mg tablet Take one-half tablet by mouth daily.   ? aspirin EC 81 mg tablet Take one tablet by mouth daily. Take with food.   ? atorvastatin (LIPITOR) 40 mg tablet Take one tablet by mouth daily.   ? carvediloL (COREG) 6.25 mg tablet Take one tablet by mouth twice daily. Take with food.   ? clopiDOGrel (PLAVIX) 75 mg tablet Take one tablet by mouth daily.   ? dutasteride (AVODART) 0.5 mg PO capsule Take one capsule by mouth daily.   ? fluticasone propionate (FLOVENT HFA) 110 mcg/actuation inhaler Inhale one puff by mouth into the lungs twice daily.   ? food supplemt, lactose-reduced (ENSURE PLUS) 0.05 gram- 1.5 kcal/mL oral liquid Take 1 Can by mouth twice daily with meals.   ? furosemide (LASIX) 20 mg tablet Take one tablet by mouth daily as needed.   ? levothyroxine (SYNTHROID) 88 mcg tablet Take one tablet by mouth daily 30 minutes before breakfast.   ? montelukast (SINGULAIR) 10 mg tablet Take one tablet by mouth at bedtime daily. (Patient taking differently: Take one tablet by mouth daily.)   ? MYRBETRIQ 25 mg tablet Take one tablet by mouth daily.   ? pantoprazole DR (PROTONIX) 40 mg tablet Take one tablet by mouth daily.           Creatinine: 1.52 (05/21/21)    Comments:

## 2021-07-06 ENCOUNTER — Encounter: Admit: 2021-07-06 | Discharge: 2021-07-06 | Payer: MEDICARE

## 2021-07-20 ENCOUNTER — Encounter: Admit: 2021-07-20 | Discharge: 2021-07-20 | Payer: MEDICARE

## 2021-09-28 ENCOUNTER — Encounter: Admit: 2021-09-28 | Discharge: 2021-09-28 | Payer: MEDICARE

## 2021-11-15 ENCOUNTER — Encounter: Admit: 2021-11-15 | Discharge: 2021-11-15 | Payer: MEDICARE

## 2021-11-22 ENCOUNTER — Encounter: Admit: 2021-11-22 | Discharge: 2021-11-22 | Payer: MEDICARE

## 2021-11-22 ENCOUNTER — Ambulatory Visit: Admit: 2021-11-22 | Discharge: 2021-11-22 | Payer: MEDICARE

## 2021-11-22 DIAGNOSIS — R54 Age-related physical debility: Secondary | ICD-10-CM

## 2021-11-22 DIAGNOSIS — I5032 Chronic diastolic (congestive) heart failure: Secondary | ICD-10-CM

## 2021-11-22 DIAGNOSIS — R42 Dizziness and giddiness: Secondary | ICD-10-CM

## 2021-11-22 DIAGNOSIS — I509 Heart failure, unspecified: Secondary | ICD-10-CM

## 2021-11-22 DIAGNOSIS — I69320 Aphasia following cerebral infarction: Secondary | ICD-10-CM

## 2021-11-22 DIAGNOSIS — I5023 Acute on chronic systolic (congestive) heart failure: Secondary | ICD-10-CM

## 2021-11-22 DIAGNOSIS — I38 Endocarditis, valve unspecified: Secondary | ICD-10-CM

## 2021-11-22 DIAGNOSIS — I10 Essential (primary) hypertension: Secondary | ICD-10-CM

## 2021-11-22 DIAGNOSIS — I6381 Other cerebral infarction due to occlusion or stenosis of small artery: Secondary | ICD-10-CM

## 2021-11-22 DIAGNOSIS — I6523 Occlusion and stenosis of bilateral carotid arteries: Secondary | ICD-10-CM

## 2021-11-22 DIAGNOSIS — E7800 Pure hypercholesterolemia, unspecified: Secondary | ICD-10-CM

## 2021-11-22 DIAGNOSIS — I2510 Atherosclerotic heart disease of native coronary artery without angina pectoris: Secondary | ICD-10-CM

## 2021-11-22 DIAGNOSIS — Z9889 Other specified postprocedural states: Secondary | ICD-10-CM

## 2021-11-22 DIAGNOSIS — R0600 Dyspnea, unspecified: Secondary | ICD-10-CM

## 2021-11-22 DIAGNOSIS — Z720 Tobacco use: Secondary | ICD-10-CM

## 2021-11-22 DIAGNOSIS — I6501 Occlusion and stenosis of right vertebral artery: Secondary | ICD-10-CM

## 2021-11-22 DIAGNOSIS — E785 Hyperlipidemia, unspecified: Secondary | ICD-10-CM

## 2021-11-22 DIAGNOSIS — Z8679 Personal history of other diseases of the circulatory system: Secondary | ICD-10-CM

## 2021-11-22 DIAGNOSIS — I429 Cardiomyopathy, unspecified: Secondary | ICD-10-CM

## 2021-11-22 DIAGNOSIS — I350 Nonrheumatic aortic (valve) stenosis: Secondary | ICD-10-CM

## 2021-11-22 DIAGNOSIS — K635 Polyp of colon: Secondary | ICD-10-CM

## 2021-11-22 DIAGNOSIS — J449 Chronic obstructive pulmonary disease, unspecified: Secondary | ICD-10-CM

## 2021-11-22 DIAGNOSIS — M1990 Unspecified osteoarthritis, unspecified site: Secondary | ICD-10-CM

## 2021-11-22 DIAGNOSIS — I2699 Other pulmonary embolism without acute cor pulmonale: Secondary | ICD-10-CM

## 2021-11-22 DIAGNOSIS — I739 Peripheral vascular disease, unspecified: Secondary | ICD-10-CM

## 2021-11-22 DIAGNOSIS — J45909 Unspecified asthma, uncomplicated: Secondary | ICD-10-CM

## 2021-11-22 DIAGNOSIS — Z952 Presence of prosthetic heart valve: Secondary | ICD-10-CM

## 2021-11-22 DIAGNOSIS — I6529 Occlusion and stenosis of unspecified carotid artery: Secondary | ICD-10-CM

## 2021-11-22 DIAGNOSIS — I6312 Cerebral infarction due to embolism of basilar artery: Secondary | ICD-10-CM

## 2021-11-22 DIAGNOSIS — N183 Stage 3 chronic kidney disease, unspecified whether stage 3a or 3b CKD (HCC): Secondary | ICD-10-CM

## 2021-11-22 DIAGNOSIS — I444 Left anterior fascicular block: Secondary | ICD-10-CM

## 2021-11-22 DIAGNOSIS — I4729 NSVT (nonsustained ventricular tachycardia) (HCC): Secondary | ICD-10-CM

## 2021-11-22 DIAGNOSIS — E039 Hypothyroidism, unspecified: Secondary | ICD-10-CM

## 2021-11-22 DIAGNOSIS — I7090 Unspecified atherosclerosis: Secondary | ICD-10-CM

## 2021-11-22 NOTE — Progress Notes
Date of Service: 11/22/2021    Javier Gutierrez is a 81 y.o. male.       HPI     Javier Gutierrez is a 81 y.o. 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    From a cardiac standpoint patient remained stable.  He does not report symptoms of chest pain, no angina, no use of sublingual nitroglycerin.  Today, 11/22/2021 his blood pressure is elevated in the office.  He is currently on amlodipine 2.5 mg p.o. daily and carvedilol 6.25 milligrams tablet p.o. twice daily, he has continued on clopidogrel.    At present time he does not use a walker/cane for ambulation.  He continues to live with his wife.    The last evaluation of the TAVR was performed with an echocardiogram dated 05/12/2021, it demonstrated normal LVEF = 55%, severe mitral annular calcification and calcific mitral valve, mild to moderate mitral valve stenosis, MG = 6 mmHg, normal function of the number 21 mm sapient S3 TAVR, MG = 6 mmHg.    Today, 11/22/2021 patient was evaluated with a carotid duplex, the preliminary report did not demonstrate any significant reoccurring obstructive disease.  This was shared with the patient and his daughter at the time of this office visit.         Vitals:    11/22/21 1022 11/22/21 1039   BP: (!) 165/86 (!) 157/94   BP Source: Arm, Left Upper Arm, Right Upper   Pulse: 72    SpO2: 97%    O2 Device: None (Room air)    PainSc: Zero    Weight: 75.8 kg (167 lb)    Height: 177.8 cm (5' 10)      Body mass index is 23.96 kg/m?[name] Kitchen     Past Medical History  Patient Active Problem List    Diagnosis Date Noted   ? Hospital discharge follow-up 05/30/2021   ? Aspiration pneumonia (HCC) 05/20/2021   ? Malaise and fatigue 05/18/2021   ? NSVT (nonsustained ventricular tachycardia) (HCC) 04/21/2020   ? 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 Weight gain 07/02/2019   ? COVID-19 vaccine administered 07/02/2019   ? 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 History of left-sided carotid endarterectomy 06/06/2018   ? Mild persistent asthma without complication 05/21/2018   ? Acute blood loss as cause of postoperative anemia 02/24/2018   ? Dysarthria 02/21/2018   ? Decreased functional mobility and endurance 02/21/2018   ? Impaired mobility and ADLs 02/21/2018   ? Facial droop 02/21/2018   ? Left sided lacunar stroke (HCC) 02/21/2018   ? History of pneumonia 01/24/2017   ? History of pulmonary embolus (PE) 01/24/2017   ? Impaired mobility 12/12/2016   ? Pulmonary embolism (HCC) 12/12/2016   ? Necrotizing pneumonia (HCC) 12/12/2016   ? Parapneumonic effusion  11/21/2016   ? Pneumonia 11/20/2016   ? Localized edema 12/13/2015   ? 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 Chronic diastolic (congestive) heart failure (HCC) 08/02/2015   ? Stage 3 chronic kidney disease (HCC) 04/05/2015   ? Loss of appetite 04/05/2015   ? PAD (peripheral artery disease) (HCC) 04/05/2015   ? Aortic stenosis 03/11/2015   ? Weight loss 03/10/2015   ? Frailty    ? Tobacco abuse    ? Acute on chronic systolic heart failure, NYHA class 2 (HCC)    ? Palpable abdominal aorta 01/25/2015   ? Systolic murmur of aorta 04/29/2014   ? Weight loss, unintentional 05/01/2012   ? 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 Tobacco use disorder 04/28/2010   ? Bilateral carotid artery disease (HCC) 04/28/2010   ? Cardiomyopathy (HCC) 11/29/2008     History of cardiomyopathy - left ventricular systolic function has normalized.     ? History of renal artery stenosis 11/29/2008     A history of renal artery stenosis, status post PTA to left renal artery on 05/2001.     ? Cerebral embolism with cerebral infarction (HCC) 11/29/2008     Left Lacunar Infarct  Left ICA stenosis 75%  Etiology of stroke: small vessel   - MRI with acute infract at junction of lentiform nucleus + post limb of internal capsule  - CTA h+n notable for L ICA stenosis (75%), R ICA stenosis (50%), right vert occlusion  - LDL 129   - A1c- 5.4  - ECHO- EF 55%, Grad 2 LV diastolic dysfunction. No shunt, no thrombus. LA 3.69cm.   - s/p right CEA and  Right ICA stent placement on 1/27.     ? Hypertension 11/29/2008   ? Hyperlipidemia 11/29/2008   ? CAD (coronary artery disease) 11/29/2008     03/11/15: Cardiac cath ( via Rt radial approach), 30%pLAD, 40%mLAD, 50-60%mLcx, 30-40%pRCA, 30%mRCA followed by 30% distal  - aggressive medial therapy for CAD.     ? H/O 11/29/2008         Review of Systems   Constitutional: Negative.   HENT: Negative.    Eyes: Negative.    Cardiovascular: Negative.    Respiratory: Negative.    Endocrine: Negative.    Hematologic/Lymphatic: Negative.    Skin: Negative.    Musculoskeletal: Negative.    Gastrointestinal: Negative.    Genitourinary: Negative.    Neurological: Positive for dizziness and light-headedness.   Psychiatric/Behavioral: Negative.    Allergic/Immunologic: Negative.        Physical Exam  General Appearance: normal in appearance  Skin: warm, moist, no ulcers or xanthomas  Eyes: conjunctivae and lids normal, pupils are equal and round  Lips & Oral Mucosa: no pallor or cyanosis  Neck Veins: neck veins are flat, neck veins are not distended  Chest Inspection: chest is normal in appearance  Respiratory Effort: breathing comfortably, no respiratory distress  Auscultation/Percussion: lungs clear to auscultation, no rales or rhonchi, no wheezing  Cardiac Rhythm: regular rhythm and normal rate  Cardiac Auscultation: S1, S2 normal, no rub, no gallop  Murmurs:II/VI systolic??murmur  Carotid Arteries: normal carotid upstroke bilaterally, L CEA, bilateral bruits  Lower Extremity Edema: no lower extremity edema  Abdominal Exam: soft, non-tender, no masses, bowel sounds normal  Liver &?Spleen: no organomegaly  Language and Memory: patient responsive and seems to comprehend information  Neurologic Exam: neurological assessment grossly intact    Cardiovascular Studies      Cardiovascular Health  Factors  Vitals BP Readings from Last 3 Encounters:   11/22/21 (!) 157/94   05/30/21 127/86   05/21/21 (!) 143/64     Wt Readings from Last 3 Encounters:   11/22/21 75.8 kg (167 lb)   05/30/21 79 kg (174 lb 3.2 oz)   05/18/21 81.6 kg (180 lb)     BMI Readings from Last 3 Encounters:   11/22/21 23.96 kg/m?   05/30/21 25.00 kg/m?   05/18/21 25.83 kg/m?      Smoking Social History     Tobacco Use   Smoking Status Former   ? Packs/day: 1.50   ? Years: 60.00   ? Additional pack years: 0.00   ? Total pack years: 90.00   ? Types: Cigarettes   ? Quit date: 02/13/2018   ? Years since quitting: 3.7   Smokeless Tobacco Never   Tobacco Comments    0.5-2 ppd history      Lipid Profile Cholesterol   Date Value Ref Range Status   04/02/2019 124  Final     HDL   Date Value Ref Range Status   04/02/2019 28 (L) >40 Final     LDL   Date Value Ref Range Status   04/02/2019 60  Final     Triglycerides   Date Value Ref Range Status   04/02/2019 179 (H) <150 Final      Blood Sugar Hemoglobin A1C   Date Value Ref Range Status   02/13/2018 5.4 4.0 - 6.0 % Final     Comment:     The ADA recommends that most patients with type 1 and type 2 diabetes maintain   an A1c level <7%.       Glucose   Date Value Ref Range Status   10/17/2021 101  Final   05/21/2021 93 70 - 100 MG/DL Final   45/40/9811 88 70 - 100 MG/DL Final     Glucose, POC   Date Value Ref Range Status   12/03/2016 87 70 - 100 MG/DL Final   91/47/8295 621 (H) 70 - 100 MG/DL Final   30/86/5784 696 (H) 70 - 100 MG/DL Final          Problems Addressed Today  Encounter Diagnoses   Name Primary?   ? Coronary artery disease involving native coronary artery of native heart without angina pectoris Yes   ? Nonrheumatic aortic valve stenosis    ? PAD (peripheral artery disease) (HCC)    ? Chronic diastolic (congestive) heart failure (HCC)    ? Cardiomyopathy, unspecified type (HCC)    ? 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 Bilateral carotid artery stenosis    ? Left anterior fascicular block    ? Acute on chronic systolic heart failure, NYHA class 2 (HCC)    ? S/P TAVR (transcatheter aortic valve replacement)    ? Other acute pulmonary embolism without acute cor pulmonale (HCC)    ? History of left-sided carotid endarterectomy    ? NSVT (nonsustained ventricular tachycardia) (HCC)    ? Occlusion of right vertebral artery    ? Stage 3 chronic kidney disease, unspecified whether stage 3a or 3b CKD (HCC)    ? History of renal artery stenosis    ? 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 Left sided lacunar stroke (HCC)        Assessment and Plan     Assessment:    1.  History  of CVA-this occurred in July 2020  2.  History of severe left carotid artery stenosis  3.  Status post left CEA  ? Intraoperatively patient was found to have a stenosis distal to the operated area, he also underwent stent placement-he recovered well following the CVA  4.  History of severe aortic valve stenosis  5.  Status post TAVR, patient received #29 SAPIEN S3 valve in March 2021-normal function, last MG = 6 mmHg on an echocardiogram performed in April 2023  6.  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  Nonobstructive coronary artery disease  8.  Orthostatic hypotension- patient is currently on a small dose of amlodipine and carvedilol  9.  Hyperlipidemia-on atorvastatin  10.  Abnormal EKG, bifascicular block-RBBB, LAD, LAFB  11.  Overall general weakness, debility due to multiple comorbidities and physical deconditioning    Plan:    1.  Continue current dose of amlodipine and carvedilol  2.  I do recommend to maintain a fair level of physical activity  3.  2D echo Doppler study and office visit in approximately 6 to 8 months.    Total Time Today was 40 minutes in the following activities: Preparing to see the patient, Obtaining and/or reviewing separately obtained history, Performing a medically appropriate examination and/or evaluation, Counseling and educating the patient/family/caregiver, Ordering medications, tests, or procedures, Referring and communication with other health care professionals (when not separately reported), Documenting clinical information in the electronic or other health record, Independently interpreting results (not separately reported) and communicating results to the patient/family/caregiver and Care coordination (not separately reported)               Current Medications (including today's revisions)  ? albuterol (VENTOLIN HFA) 90 mcg/actuation inhaler Inhale two puffs by mouth into the lungs every 6 hours as needed for Wheezing or Shortness of Breath. Shake well before use.   ? albuterol 0.083% (PROVENTIL) 2.5 mg /3 mL (0.083 %) nebulizer solution Inhale 3 mL solution by nebulizer as directed twice daily as needed.   ? amLODIPine (NORVASC) 5 mg tablet Take one-half tablet by mouth daily.   ? aspirin EC 81 mg tablet Take one tablet by mouth daily. Take with food.   ? atorvastatin (LIPITOR) 40 mg tablet Take one tablet by mouth daily.   ? budesonide (PULMICORT) 0.5 mg/2 mL nebulizer solution Inhale 2 mL solution by nebulizer as directed twice daily.   ? carvediloL (COREG) 6.25 mg tablet Take one tablet by mouth twice daily. Take with food.   ? clopiDOGrel (PLAVIX) 75 mg tablet Take one tablet by mouth daily.   ? dutasteride (AVODART) 0.5 mg PO capsule Take one capsule by mouth daily.   ? fluticasone propionate (FLOVENT HFA) 110 mcg/actuation inhaler Inhale two puffs by mouth into the lungs twice daily.   ? food supplemt, lactose-reduced (ENSURE PLUS) 0.05 gram- 1.5 kcal/mL oral liquid Take 1 Can by mouth twice daily with meals.   ? furosemide (LASIX) 20 mg tablet Take one tablet by mouth daily as needed.   ? levothyroxine (SYNTHROID) 88 mcg tablet Take one tablet by mouth daily 30 minutes before breakfast.   ? montelukast (SINGULAIR) 10 mg tablet Take one tablet by mouth at bedtime daily. (Patient taking differently: Take one tablet by mouth daily.)   ? MYRBETRIQ 25 mg tablet Take one tablet by mouth daily.   ? pantoprazole DR (PROTONIX) 40 mg tablet Take one tablet by mouth daily.

## 2021-11-22 NOTE — Telephone Encounter
-----   Message from Vertell Novak, MD sent at 11/22/2021  2:42 PM CDT -----  Please let the patient know that the carotid duplex did not show any significant blockages, we will continue to follow, I can see him back in the office in about 6 to 8 months, he will need an echocardiogram prior to the next office visit or perhaps at the same time.    Thank you      ----- Message -----  From: Thompson Grayer, MD  Sent: 11/22/2021   2:24 PM CDT  To: Vertell Novak, MD

## 2021-11-24 ENCOUNTER — Encounter: Admit: 2021-11-24 | Discharge: 2021-11-24 | Payer: MEDICARE

## 2022-01-17 ENCOUNTER — Encounter: Admit: 2022-01-17 | Discharge: 2022-01-17 | Payer: MEDICARE

## 2022-05-17 ENCOUNTER — Encounter: Admit: 2022-05-17 | Discharge: 2022-05-17 | Payer: MEDICARE

## 2022-05-23 ENCOUNTER — Ambulatory Visit: Admit: 2022-05-23 | Discharge: 2022-05-23 | Payer: MEDICARE

## 2022-05-23 ENCOUNTER — Encounter: Admit: 2022-05-23 | Discharge: 2022-05-23 | Payer: MEDICARE

## 2022-05-23 DIAGNOSIS — I6381 Other cerebral infarction due to occlusion or stenosis of small artery: Secondary | ICD-10-CM

## 2022-05-23 DIAGNOSIS — Z136 Encounter for screening for cardiovascular disorders: Secondary | ICD-10-CM

## 2022-05-23 DIAGNOSIS — I10 Essential (primary) hypertension: Secondary | ICD-10-CM

## 2022-05-23 DIAGNOSIS — I350 Nonrheumatic aortic (valve) stenosis: Secondary | ICD-10-CM

## 2022-05-23 DIAGNOSIS — I429 Cardiomyopathy, unspecified: Secondary | ICD-10-CM

## 2022-05-23 DIAGNOSIS — E785 Hyperlipidemia, unspecified: Secondary | ICD-10-CM

## 2022-05-23 DIAGNOSIS — I2510 Atherosclerotic heart disease of native coronary artery without angina pectoris: Secondary | ICD-10-CM

## 2022-05-23 DIAGNOSIS — I38 Endocarditis, valve unspecified: Secondary | ICD-10-CM

## 2022-05-23 DIAGNOSIS — I509 Heart failure, unspecified: Secondary | ICD-10-CM

## 2022-05-23 DIAGNOSIS — Z7409 Other reduced mobility: Secondary | ICD-10-CM

## 2022-05-23 DIAGNOSIS — R54 Age-related physical debility: Secondary | ICD-10-CM

## 2022-05-23 DIAGNOSIS — I5032 Chronic diastolic (congestive) heart failure: Secondary | ICD-10-CM

## 2022-05-23 DIAGNOSIS — Z720 Tobacco use: Secondary | ICD-10-CM

## 2022-05-23 DIAGNOSIS — I4729 NSVT (nonsustained ventricular tachycardia) (HCC): Secondary | ICD-10-CM

## 2022-05-23 DIAGNOSIS — I6312 Cerebral infarction due to embolism of basilar artery: Secondary | ICD-10-CM

## 2022-05-23 DIAGNOSIS — I6501 Occlusion and stenosis of right vertebral artery: Secondary | ICD-10-CM

## 2022-05-23 DIAGNOSIS — I739 Peripheral vascular disease, unspecified: Secondary | ICD-10-CM

## 2022-05-23 DIAGNOSIS — R0600 Dyspnea, unspecified: Secondary | ICD-10-CM

## 2022-05-23 DIAGNOSIS — I69320 Aphasia following cerebral infarction: Secondary | ICD-10-CM

## 2022-05-23 DIAGNOSIS — I2699 Other pulmonary embolism without acute cor pulmonale: Secondary | ICD-10-CM

## 2022-05-23 DIAGNOSIS — Z8679 Personal history of other diseases of the circulatory system: Secondary | ICD-10-CM

## 2022-05-23 DIAGNOSIS — Z23 Encounter for immunization: Secondary | ICD-10-CM

## 2022-05-23 DIAGNOSIS — M1990 Unspecified osteoarthritis, unspecified site: Secondary | ICD-10-CM

## 2022-05-23 DIAGNOSIS — I5023 Acute on chronic systolic (congestive) heart failure: Secondary | ICD-10-CM

## 2022-05-23 DIAGNOSIS — R42 Dizziness and giddiness: Secondary | ICD-10-CM

## 2022-05-23 DIAGNOSIS — I444 Left anterior fascicular block: Secondary | ICD-10-CM

## 2022-05-23 DIAGNOSIS — I4891 Unspecified atrial fibrillation: Secondary | ICD-10-CM

## 2022-05-23 DIAGNOSIS — I6523 Occlusion and stenosis of bilateral carotid arteries: Secondary | ICD-10-CM

## 2022-05-23 DIAGNOSIS — I6529 Occlusion and stenosis of unspecified carotid artery: Secondary | ICD-10-CM

## 2022-05-23 DIAGNOSIS — I951 Orthostatic hypotension: Secondary | ICD-10-CM

## 2022-05-23 DIAGNOSIS — I7090 Unspecified atherosclerosis: Secondary | ICD-10-CM

## 2022-05-23 DIAGNOSIS — J449 Chronic obstructive pulmonary disease, unspecified: Secondary | ICD-10-CM

## 2022-05-23 DIAGNOSIS — E7800 Pure hypercholesterolemia, unspecified: Secondary | ICD-10-CM

## 2022-05-23 DIAGNOSIS — N183 Stage 3 chronic kidney disease, unspecified whether stage 3a or 3b CKD (HCC): Secondary | ICD-10-CM

## 2022-05-23 DIAGNOSIS — J45909 Unspecified asthma, uncomplicated: Secondary | ICD-10-CM

## 2022-05-23 DIAGNOSIS — Z9889 Other specified postprocedural states: Secondary | ICD-10-CM

## 2022-05-23 DIAGNOSIS — Z952 Presence of prosthetic heart valve: Secondary | ICD-10-CM

## 2022-05-23 DIAGNOSIS — K635 Polyp of colon: Secondary | ICD-10-CM

## 2022-05-23 DIAGNOSIS — E039 Hypothyroidism, unspecified: Secondary | ICD-10-CM

## 2022-05-23 MED ORDER — LIDOCAINE 5 % TP OINT
Freq: Once | TOPICAL | 0 refills
Start: 2022-05-23 — End: ?

## 2022-05-23 MED ORDER — CARVEDILOL 12.5 MG PO TAB
12.5 mg | ORAL_TABLET | Freq: Two times a day (BID) | ORAL | 3 refills | 90.00000 days | Status: AC
Start: 2022-05-23 — End: ?

## 2022-05-23 MED ORDER — APIXABAN 2.5 MG PO TAB
2.5 mg | ORAL_TABLET | Freq: Two times a day (BID) | ORAL | 1 refills | Status: AC
Start: 2022-05-23 — End: ?

## 2022-05-23 NOTE — Progress Notes
Date of Service: 05/23/2022    Chibueze Galante is a 82 y.o. male.       HPI      Rigsby Peregrino is a 82 y.o. 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    In July 2022 patient did sustain a stroke he was found to have 75% stenosis of the left ICA, he did undergo carotid endarterectomy, intraoperatively he was found to have persistent stenosis distal to the operated area and underwent stent placement, patient has been on clopidogrel ever since.    Patient is accompanied at this office visit by his daughter.  She states that lately he has been experiencing shortness of breath.    Patient has not had symptoms of chest pain and no heart palpitations.    A twelve-lead EKG performed today in the office demonstrated atrial fibrillation, ventricular rate 116 bpm.    The last evaluation of the TAVR was performed with an echocardiogram dated 05/12/2021, it demonstrated normal LVEF = 55%, severe mitral annular calcification and calcific mitral valve, mild to moderate mitral valve stenosis, MG = 6 mmHg, normal function of the number 21 mm sapient S3 TAVR, MG = 6 mmHg.    A carotid artery duplex dated 11/22/2021-no significant stenosis in bilateral common and internal carotid arteries, left internal carotid stent was visualized, it was patent, mild plaque in both carotid arteries.           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    Weight gain 07/02/2019    COVID-19 vaccine administered 07/02/2019    Orthostatic hypotension 07/02/2019    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    History of left-sided carotid endarterectomy 06/06/2018    Mild persistent asthma without complication 05/21/2018    Acute blood loss as cause of postoperative anemia 02/24/2018    Dysarthria 02/21/2018    Decreased functional mobility and endurance 02/21/2018    Impaired mobility and ADLs 02/21/2018    Facial droop 02/21/2018    Left sided lacunar stroke (HCC) 02/21/2018    History of pneumonia 01/24/2017    History of pulmonary embolus (PE) 01/24/2017    Impaired mobility 12/12/2016    Pulmonary embolism (HCC) 12/12/2016    Necrotizing pneumonia (HCC) 12/12/2016    Parapneumonic effusion 11/21/2016    Pneumonia 11/20/2016    Localized edema 12/13/2015    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    Chronic diastolic (congestive) heart failure (HCC) 08/02/2015    Stage 3 chronic kidney disease (HCC) 04/05/2015    Loss of appetite 04/05/2015    PAD (peripheral artery disease) (HCC) 04/05/2015    Aortic stenosis 03/11/2015    Weight loss 03/10/2015    Frailty     Tobacco abuse     Acute on chronic systolic heart failure, NYHA class 2 (HCC)     Palpable abdominal aorta 01/25/2015    Systolic murmur of aorta 04/29/2014    Weight loss, unintentional 05/01/2012    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      History of renal artery stenosis 11/29/2008     A history of renal artery stenosis, status post PTA to left renal artery on 05/2001.      Cerebral embolism with cerebral infarction (HCC) 11/29/2008     Left Lacunar Infarct  Left ICA stenosis 75%  Etiology of stroke: small vessel   - MRI with acute infract at junction of lentiform nucleus + post limb of internal capsule  - CTA h+n notable for L ICA stenosis (75%), R ICA stenosis (50%), right vert occlusion  - LDL 129   - A1c- 5.4  - ECHO- EF 55%, Grad 2 LV diastolic dysfunction. No shunt, no thrombus. LA 3.69cm.   - s/p right CEA and  Right ICA stent placement on 1/27.      Hypertension 11/29/2008    Hyperlipidemia 11/29/2008    CAD (coronary artery disease) 11/29/2008     03/11/15: Cardiac cath ( via Rt radial approach), 30%pLAD, 40%mLAD, 50-60%mLcx, 30-40%pRCA, 30%mRCA followed by 30% distal  - aggressive medial therapy for CAD.      H/O 11/29/2008         Review of Systems   Constitutional: Negative.   HENT: Negative.     Eyes: Negative.    Cardiovascular: Negative.    Respiratory: Negative.     Endocrine: Negative.    Hematologic/Lymphatic: Negative.    Skin: Negative.    Musculoskeletal: Negative.    Gastrointestinal: Negative.    Genitourinary: Negative.    Neurological: Negative.    Psychiatric/Behavioral: Negative.     Allergic/Immunologic: Negative.        Physical Exam  General Appearance: normal in appearance  Skin: warm, moist, no ulcers or xanthomas  Eyes: conjunctivae and lids normal, pupils are equal and round  Lips & Oral Mucosa: no pallor or cyanosis  Neck Veins: neck veins are flat, neck veins are not distended  Chest Inspection: chest is normal in appearance  Respiratory Effort: breathing comfortably, no respiratory distress  Auscultation/Percussion: lungs clear to auscultation, no rales or rhonchi, no wheezing  Cardiac Rhythm: irregularly irregular rhythm and normal rate  Cardiac Auscultation: S1, S2 normal, no rub, no gallop  Murmurs: II/VI systolic  murmur   Carotid Arteries: normal carotid upstroke bilaterally, L CEA, bilateral bruits   Abdominal Aorta: no abdominal aortic bruit  Lower Extremity Edema: no lower extremity edema  Abdominal Exam: soft, non-tender, no masses, bowel sounds normal  Liver & Spleen: no organomegaly  Neurologic Exam: neurological assessment grossly intact         Cardiovascular Studies  Twelve-lead EKG demonstrates atrial fibrillation/possible atypical atrial flutter, ventricular rate 116 bpm, left axis deviation,  left anterior fascicular block.    Cardiovascular Health Factors  Vitals BP Readings from Last 3 Encounters:   05/23/22 (!) 135/97   11/22/21 (!) 165/86   11/22/21 (!) 157/94     Wt Readings from Last 3 Encounters:   05/23/22 76.4 kg (168 lb 6.4 oz)   11/22/21 75.8 kg (167 lb)   11/22/21 75.8 kg (167 lb)     BMI Readings from Last 3 Encounters:   05/23/22 24.16 kg/m?   11/22/21 23.96 kg/m?   11/22/21 23.96 kg/m?      Smoking Social History     Tobacco Use   Smoking Status Former    Current packs/day: 0.00    Average packs/day: 1.5 packs/day for 60.0 years (90.0 ttl pk-yrs)    Types: Cigarettes    Start date: 02/13/1958    Quit date: 02/13/2018    Years since quitting: 4.2   Smokeless Tobacco Never   Tobacco Comments    0.5-2 ppd history      Lipid Profile Cholesterol   Date Value Ref Range Status   04/02/2019 124  Final     HDL   Date Value Ref Range Status   04/02/2019 28 (L) >40 Final     LDL   Date Value Ref Range Status   04/02/2019 60  Final     Triglycerides   Date Value Ref Range Status   04/02/2019 179 (H) <150 Final      Blood Sugar Hemoglobin A1C   Date Value Ref Range Status   02/13/2018 5.4 4.0 - 6.0 % Final     Comment:     The ADA recommends that most patients with type 1 and type 2 diabetes maintain   an A1c level <7%.       Glucose   Date Value Ref Range Status   10/17/2021 101  Final   05/21/2021 93 70 - 100 MG/DL Final   16/10/9602 88 70 - 100 MG/DL Final     Glucose, POC   Date Value Ref Range Status   12/03/2016 87 70 - 100 MG/DL Final   54/09/8117 147 (H) 70 - 100 MG/DL Final   82/95/6213 086 (H) 70 - 100 MG/DL Final          Problems Addressed Today  Encounter Diagnoses   Name Primary?    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)     Chronic diastolic (congestive) heart failure (HCC)     Coronary artery disease involving native coronary artery of native heart without angina pectoris     Bilateral carotid artery stenosis     Tobacco abuse     Stage 3 chronic kidney disease, unspecified whether stage 3a or 3b CKD (HCC)     Impaired mobility and ADLs     COVID-19 vaccine administered     Nonrheumatic aortic valve stenosis     Cardiomyopathy, unspecified type (HCC)     Acute on chronic systolic heart failure, NYHA class 2 (HCC)  History of renal artery stenosis     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  Atrial tachyarrhythmia  Unknown duration  Patient has been symptomatic with fatigue and shortness of breath  On the twelve-lead EKG performed in our office ventricular rate is 116 bpm  2. History of CVA-this occurred in July 2020  3.  History of severe left carotid artery stenosis  4.  Status post left CEA  Intraoperatively patient was found to have a stenosis distal to the operated area, he also underwent stent placement-he recovered well following the CVA  5.  History of severe aortic valve stenosis  6.  Status post TAVR, patient received #29 SAPIEN S3 valve in March 2021-normal function, last MG = 6 mmHg on an echocardiogram performed in April 2023  7.  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  Nonobstructive coronary artery disease  9.  Orthostatic hypotension- patient is currently on a small dose of amlodipine and carvedilol  10.  Hyperlipidemia-on atorvastatin  11.  Abnormal EKG, bifascicular block-RBBB, LAD, LAFB  12.  Overall general weakness, debility due to multiple comorbidities and physical deconditioning    Plan:    1.  Discontinue amlodipine and discontinue aspirin  2.  Increase carvedilol to 12.5 mg p.o. twice daily  3.  Start apixaban 5 mg p.o. twice daily  4.  Schedule for TEE guided cardioversion  5.  Follow-up in [place] office following this procedure.    Total Time Today was 40 minutes in the following activities: Preparing to see the patient, Obtaining and/or reviewing separately obtained history, Performing a medically appropriate examination and/or evaluation, Counseling and educating the patient/family/caregiver, Ordering medications, tests, or procedures, Referring and communication with other health care professionals (when not separately reported), Documenting clinical information in the electronic or other health record, Independently interpreting results (not separately reported) and communicating results to the patient/family/caregiver, and Care coordination (not separately reported)          Current Medications (including today's revisions)   albuterol (VENTOLIN HFA) 90 mcg/actuation inhaler Inhale two puffs by mouth into the lungs every 6 hours as needed for Wheezing or Shortness of Breath. Shake well before use.    albuterol 0.083% (PROVENTIL) 2.5 mg /3 mL (0.083 %) nebulizer solution Inhale 3 mL solution by nebulizer as directed twice daily as needed.    amLODIPine (NORVASC) 5 mg tablet Take one-half tablet by mouth daily.    aspirin EC 81 mg tablet Take one tablet by mouth daily. Take with food.    atorvastatin (LIPITOR) 40 mg tablet Take one tablet by mouth daily.    budesonide (PULMICORT) 0.5 mg/2 mL nebulizer solution Inhale 2 mL solution by nebulizer as directed twice daily.    carvediloL (COREG) 6.25 mg tablet Take one tablet by mouth twice daily. Take with food.    clopiDOGrel (PLAVIX) 75 mg tablet Take one tablet by mouth daily.    doxycycline monohydrate (MONODOX) 100 mg capsule Take one capsule by mouth twice daily.    dutasteride (AVODART) 0.5 mg PO capsule Take one capsule by mouth daily.    fluticasone propionate (FLOVENT HFA) 110 mcg/actuation inhaler Inhale two puffs by mouth into the lungs twice daily.    food supplemt, lactose-reduced (ENSURE PLUS)  0.05 gram- 1.5 kcal/mL oral liquid Take 1 Can by mouth twice daily with meals.    furosemide (LASIX) 20 mg tablet Take one tablet by mouth daily as needed.    guaifenesin (MUCINEX) 1,200 mg ER tablet Take one tablet by mouth twice daily.    levothyroxine (SYNTHROID) 88 mcg tablet Take one tablet by mouth daily 30 minutes before breakfast.    montelukast (SINGULAIR) 10 mg tablet Take one tablet by mouth at bedtime daily. (Patient taking differently: Take one tablet by mouth daily.)    MYRBETRIQ 25 mg tablet Take one tablet by mouth daily.    pantoprazole DR (PROTONIX) 40 mg tablet Take one tablet by mouth daily.    predniSONE (DELTASONE) 20 mg tablet Take one tablet by mouth twice daily with meals.

## 2022-05-23 NOTE — Patient Instructions
Thank you for visiting our office today.    We would like to make the following medication adjustments:    STOP Amlodipine and Aspirin  Increase Coreg to 12.5mg  daily  Start Eliquis 2.5mg  twice daily       Otherwise continue the same medications as you have been doing.          We will be pursuing the following tests after your appointment today:       Orders Placed This Encounter    ECG 12-LEAD    carvediloL (COREG) 12.5 mg tablet    apixaban (ELIQUIS) 2.5 mg tablet          Please call us in the meantime with any questions or concerns.        Please allow 5-7 business days for our providers to review your results. All normal results will go to MyChart. If you do not have Mychart, it is strongly recommended to get this so you can easily view all your results. If you do not have mychart, we will attempt to call you once with normal lab and testing results. If we cannot reach you by phone with normal results, we will send you a letter.  If you have not heard the results of your testing after one week please give [place] a call.       Your Cardiovascular Medicine [place] Team Brett Canales, Pilar Jarvis, Shawna Orleans, and Gregory)  phone number is [phone].

## 2022-05-23 NOTE — Telephone Encounter
Updated History and Physical Completed- 05/23/22    Your scheduled Procedure:Transesophageal Echocardiogram and Direct Current Cardioversion  Procedure Date:05/30/22  Procedure Arrival Time: 8am        You will need to be fasting after midnight on the night prior to the procedure(nothing to eat or drink)  You must have a driver present to take you home from the test  You will receive a call the day or 2 prior to the procedure to further instruct you about medication hold and restrictions.   Call us back with any questions at [phone]

## 2022-05-24 ENCOUNTER — Encounter: Admit: 2022-05-24 | Discharge: 2022-05-24 | Payer: MEDICARE

## 2022-05-24 NOTE — Progress Notes
Report Sheet    TEE/DCCV           Indication: a fib   Ordering Provider: Avie Arenas    Name: Javier Gutierrez     Age: 82 y.o.    DOB: March 19, 1940     MRN: [id number]  Patient phone number: [phone]    Communication Barriers: None    Lab(s) needed: K  Other: EKG    Device check needed: No  Device: No results found for: GENERATOR, EPDEVTYP    Other implanted devices/pumps: no  Has Controller (pt to bring): No    Pt Called: spoke to patient and daughter [name] 5/2  Arrival Time: 0800  Driver Information: [name] - daughter    Date of Last H&P: 05/23/22 Avie Arenas   Additional Information:     Prior TEE:       Prior DCCV:      Prior Echo: 05/23/22       Previous TEE/DCCV details:   Mildly depressed left ventricular systolic function. LVEF 45% .  Heart rate was 110-115 BPM during image acquisition.  Moderate concentric left ventricular hypertrophy.  Inferior wall hypokinesis is present.   No significant valvular abnormalities.  No pericardial effusion.     EF:   ECHO EF   Date Value Ref Range Status   05/23/2022 45 % Final       Anticoag:Eliquis Dose:2.5 Frequency:BID     Missed:Just started Eliquis 5/1, taking 2.5mg  BID because over 64 yo and creat >1.5 (1.57)  Labs   INR    HGB    Platelets    Glucose    NA    K    Creatinine    Other      Medications to HOLD day of:  Vitamins/Supplements  Lasix     Probe   Gastric Surgery    GERD    Swallow difficulty    Head/Neck Surg/Rad    Chest Surg/Rad    EGD    Varices/Esophag CA    GI bleed    Dental issues      Sedation   COPD    OSA/CPAP    Asthma    Pulm HTN    Anesthesia Issues    Smoker    ETOH & Frequency    Drug Use    Chronic Pain Med    GLP-1 Last dose:     Current Medications:    albuterol (VENTOLIN HFA) 90 mcg/actuation inhaler Inhale two puffs by mouth into the lungs every 6 hours as needed for Wheezing or Shortness of Breath. Shake well before use.    albuterol 0.083% (PROVENTIL) 2.5 mg /3 mL (0.083 %) nebulizer solution Inhale 3 mL solution by nebulizer as directed twice daily as needed.    apixaban (ELIQUIS) 2.5 mg tablet Take one tablet by mouth twice daily.    atorvastatin (LIPITOR) 40 mg tablet Take one tablet by mouth daily.    budesonide (PULMICORT) 0.5 mg/2 mL nebulizer solution Inhale 2 mL solution by nebulizer as directed twice daily.    carvediloL (COREG) 12.5 mg tablet Take one tablet by mouth twice daily. Take with food.    clopiDOGrel (PLAVIX) 75 mg tablet Take one tablet by mouth daily.    doxycycline monohydrate (MONODOX) 100 mg capsule Take one capsule by mouth twice daily.    dutasteride (AVODART) 0.5 mg PO capsule Take one capsule by mouth daily.    fluticasone propionate (FLOVENT HFA) 110 mcg/actuation inhaler Inhale two puffs by mouth into the lungs twice  daily.    food supplemt, lactose-reduced (ENSURE PLUS) 0.05 gram- 1.5 kcal/mL oral liquid Take 1 Can by mouth twice daily with meals.    furosemide (LASIX) 20 mg tablet Take one tablet by mouth daily as needed.    guaifenesin (MUCINEX) 1,200 mg ER tablet Take one tablet by mouth twice daily.    levothyroxine (SYNTHROID) 88 mcg tablet Take one tablet by mouth daily 30 minutes before breakfast.    montelukast (SINGULAIR) 10 mg tablet Take one tablet by mouth at bedtime daily. (Patient taking differently: Take one tablet by mouth daily.)    MYRBETRIQ 25 mg tablet Take one tablet by mouth daily.    pantoprazole DR (PROTONIX) 40 mg tablet Take one tablet by mouth daily.    predniSONE (DELTASONE) 20 mg tablet Take one tablet by mouth twice daily with meals.       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    History of left-sided carotid endarterectomy 06/06/2018    Mild persistent asthma without complication 05/21/2018    Acute blood loss as cause of postoperative anemia 02/24/2018    Dysarthria 02/21/2018    Decreased functional mobility and endurance 02/21/2018    Impaired mobility and ADLs 02/21/2018    Facial droop 02/21/2018    Left sided lacunar stroke (HCC) 02/21/2018    History of pneumonia 01/24/2017    History of pulmonary embolus (PE) 01/24/2017    Impaired mobility 12/12/2016    Pulmonary embolism (HCC) 12/12/2016    Necrotizing pneumonia (HCC) 12/12/2016    Parapneumonic effusion 11/21/2016    Pneumonia 11/20/2016    Localized edema 12/13/2015    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    Chronic diastolic (congestive) heart failure (HCC) 08/02/2015    Stage 3 chronic kidney disease (HCC) 04/05/2015    Loss of appetite 04/05/2015    PAD (peripheral artery disease) (HCC) 04/05/2015    Aortic stenosis 03/11/2015    Weight loss 03/10/2015    Frailty     Tobacco abuse     Acute on chronic systolic heart failure, NYHA class 2 (HCC)     Palpable abdominal aorta 01/25/2015    Systolic murmur of aorta 04/29/2014    Weight loss, unintentional 05/01/2012    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    Tobacco use disorder 04/28/2010    Bilateral carotid artery disease (HCC) 04/28/2010    Cardiomyopathy (HCC) 11/29/2008    History of renal artery stenosis 11/29/2008    Cerebral embolism with cerebral infarction (HCC) 11/29/2008    Hypertension 11/29/2008    Hyperlipidemia 11/29/2008    CAD (coronary artery disease) 11/29/2008    H/O 11/29/2008      Past Medical History:   Diagnosis Date    Acute on chronic systolic heart failure, NYHA class 2 (HCC)     Aortic valve stenosis, mild 11/29/2008    Arterial occlusion     left kidney-two stents placed    Arthritis     lower back    Asthma     CAD (coronary  artery disease) 11/29/2008 Cardiomyopathy (HCC) 11/29/2008    Carotid artery plaque     CHF (congestive heart failure) (HCC)     Colon polyps     COPD (chronic obstructive pulmonary disease) (HCC)     Coronary atherosclerosis     Dizziness     Dyspnea     Frailty     H/O: CVA (cardiovascular accident) 11/29/2008    History of renal artery stenosis 11/29/2008    HTN     Hyperlipidemia     Hyperlipidemia 11/29/2008    Hypertension 11/29/2008    Hypothyroidism     Tobacco abuse     Valvular heart disease       Surgical History:   Procedure Laterality Date    HX LUMBAR DISKECTOMY  1968    30 years ago    STENT INTRAVASCULAR  2006    kidney stent placed    Left Heart Catheterization With Ventriculogram Left 03/11/2015    Performed by Marcell Barlow, MD, [facility] at [facility] CATH LAB    Coronary Angiography N/A 03/11/2015    Performed by Marcell Barlow, MD, [facility] at [facility] CATH LAB    Possible Percutaneous Coronary Intervention N/A 03/11/2015    Performed by Marcell Barlow, MD, [facility] at [facility] CATH LAB    ESOPHAGOGASTRODUODENOSCOPY N/A 04/26/2015    Performed by Tempie Hoist, DO at [facility] ENDO    ESOPHAGOGASTRODUODENOSCOPY BIOPSY  04/26/2015    Performed by Tempie Hoist, DO at [facility] ENDO    REPLACEMENT TRANSCATHETER AORTIC VALVE (Sapien 26s3), right common femoral artery approach N/A 08/03/2015    Performed by Zella Richer, MD at [facility] CVOR    BRONCHOSCOPY Bilateral 11/30/2016    Performed by Audie Box, MD at [facility] OR    BRONCHOSCOPY WITH BRONCHIAL ALVEOLAR LAVAGE - FLEXIBLE Right 11/30/2016    Performed by Audie Box, MD at [facility] OR    ESOPHAGOGASTRODUODENOSCOPY for PEG placement N/A 12/06/2016    Performed by Buckles, Vinnie Level, MD at [facility] ENDO    ENDARTERECTOMY CAROTID ARTERY Left 02/17/2018    Performed by Buck Mam, MD at [facility] OR       Allergies: No Known Allergies  Charissa Bash, RN

## 2022-05-28 ENCOUNTER — Emergency Department: Admit: 2022-05-28 | Discharge: 2022-05-28 | Payer: MEDICARE

## 2022-05-28 ENCOUNTER — Encounter: Admit: 2022-05-28 | Discharge: 2022-05-28 | Payer: MEDICARE

## 2022-05-28 ENCOUNTER — Inpatient Hospital Stay: Admit: 2022-05-28 | Payer: MEDICARE

## 2022-05-28 DIAGNOSIS — K635 Polyp of colon: Secondary | ICD-10-CM

## 2022-05-28 DIAGNOSIS — E785 Hyperlipidemia, unspecified: Secondary | ICD-10-CM

## 2022-05-28 DIAGNOSIS — J449 Chronic obstructive pulmonary disease, unspecified: Secondary | ICD-10-CM

## 2022-05-28 DIAGNOSIS — I499 Cardiac arrhythmia, unspecified: Secondary | ICD-10-CM

## 2022-05-28 DIAGNOSIS — I10 Essential (primary) hypertension: Secondary | ICD-10-CM

## 2022-05-28 DIAGNOSIS — I509 Heart failure, unspecified: Secondary | ICD-10-CM

## 2022-05-28 DIAGNOSIS — I2510 Atherosclerotic heart disease of native coronary artery without angina pectoris: Secondary | ICD-10-CM

## 2022-05-28 DIAGNOSIS — I6529 Occlusion and stenosis of unspecified carotid artery: Secondary | ICD-10-CM

## 2022-05-28 DIAGNOSIS — E039 Hypothyroidism, unspecified: Secondary | ICD-10-CM

## 2022-05-28 DIAGNOSIS — Z952 Presence of prosthetic heart valve: Secondary | ICD-10-CM

## 2022-05-28 DIAGNOSIS — J9601 Acute respiratory failure with hypoxia: Secondary | ICD-10-CM

## 2022-05-28 DIAGNOSIS — I38 Endocarditis, valve unspecified: Secondary | ICD-10-CM

## 2022-05-28 DIAGNOSIS — Z720 Tobacco use: Secondary | ICD-10-CM

## 2022-05-28 DIAGNOSIS — R54 Age-related physical debility: Secondary | ICD-10-CM

## 2022-05-28 DIAGNOSIS — R42 Dizziness and giddiness: Secondary | ICD-10-CM

## 2022-05-28 DIAGNOSIS — Z8679 Personal history of other diseases of the circulatory system: Secondary | ICD-10-CM

## 2022-05-28 DIAGNOSIS — J45909 Unspecified asthma, uncomplicated: Secondary | ICD-10-CM

## 2022-05-28 DIAGNOSIS — M1990 Unspecified osteoarthritis, unspecified site: Secondary | ICD-10-CM

## 2022-05-28 DIAGNOSIS — I429 Cardiomyopathy, unspecified: Secondary | ICD-10-CM

## 2022-05-28 DIAGNOSIS — R0600 Dyspnea, unspecified: Secondary | ICD-10-CM

## 2022-05-28 DIAGNOSIS — I5023 Acute on chronic systolic (congestive) heart failure: Secondary | ICD-10-CM

## 2022-05-28 DIAGNOSIS — I7090 Unspecified atherosclerosis: Secondary | ICD-10-CM

## 2022-05-28 DIAGNOSIS — I350 Nonrheumatic aortic (valve) stenosis: Secondary | ICD-10-CM

## 2022-05-28 DIAGNOSIS — I5032 Chronic diastolic (congestive) heart failure: Secondary | ICD-10-CM

## 2022-05-28 LAB — POC GLUCOSE: POC GLUCOSE: 100 mg/dL (ref 70–100)

## 2022-05-28 LAB — INFLUENZA A/B AND RSV PCR
FLU A: NEGATIVE U/L (ref 7–40)
FLU B: NEGATIVE MMOL/L (ref 21–30)
RSV: NEGATIVE U/L — AB (ref 7–56)

## 2022-05-28 LAB — URINALYSIS MICROSCOPIC REFLEX TO CULTURE

## 2022-05-28 LAB — HIGH SENSITIVITY TROPONIN I, RANDOM: HIGH SENSITIVITY TROPONIN I: 16 ng/L (ref <20)

## 2022-05-28 LAB — POC BLOOD GAS VEN
BICARB, VEN POC: 30 MMOL/L
PCO2, VEN POC: 56 mmHg — ABNORMAL HIGH (ref 36–50)
PH, VEN POC: 7.3 (ref 7.30–7.40)

## 2022-05-28 LAB — IRON + BINDING CAPACITY + %SAT+ FERRITIN
% SATURATION: 6 % — ABNORMAL LOW (ref 28–42)
FERRITIN: 12 ng/mL — ABNORMAL LOW (ref 30–300)
IRON BINDING: 356 ug/dL (ref 270–380)
IRON: 22 ug/dL — ABNORMAL LOW (ref 50–185)

## 2022-05-28 LAB — URINALYSIS DIPSTICK REFLEX TO CULTURE
LEUKOCYTES: NEGATIVE K/UL — ABNORMAL HIGH (ref 0–0.80)
URINE ASCORBIC ACID, UA: NEGATIVE K/UL (ref 0–0.45)

## 2022-05-28 LAB — POC SODIUM: SODIUM, POC: 139 MMOL/L (ref 137–147)

## 2022-05-28 LAB — COMPREHENSIVE METABOLIC PANEL
BLD UREA NITROGEN: 34 mg/dL — ABNORMAL HIGH (ref 7–25)
CHLORIDE: 102 MMOL/L (ref 98–110)
GLUCOSE,PANEL: 90 mg/dL — ABNORMAL LOW (ref 70–100)
POTASSIUM: 4 MMOL/L (ref 3.5–5.1)
SODIUM: 140 MMOL/L (ref 137–147)

## 2022-05-28 LAB — POC HEMATOCRIT
HEMATOCRIT POC: 46 % (ref 40–50)
HEMOGLOBIN POC: 15 g/dL — ABNORMAL LOW (ref 13.5–16.5)

## 2022-05-28 LAB — POC POTASSIUM: POTASSIUM, POC: 3.9 MMOL/L — ABNORMAL LOW (ref 3.5–5.1)

## 2022-05-28 LAB — COVID-19 (SARS-COV-2) PCR

## 2022-05-28 LAB — PHOSPHORUS: PHOSPHORUS: 2.4 mg/dL (ref <20.7)

## 2022-05-28 LAB — POC LACTATE: LACTIC ACID POC: 1.9 MMOL/L (ref 0.5–2.0)

## 2022-05-28 LAB — CBC AND DIFF: WBC COUNT: 26 K/UL — ABNORMAL HIGH (ref 4.5–11.0)

## 2022-05-28 LAB — VITAMIN B12: VITAMIN B12: 134 pg/mL — ABNORMAL HIGH (ref 180–914)

## 2022-05-28 LAB — PTT (APTT): PTT: 26 s — ABNORMAL HIGH (ref 24.0–36.5)

## 2022-05-28 LAB — MAGNESIUM: MAGNESIUM: 1.9 mg/dL (ref 1.6–2.6)

## 2022-05-28 LAB — PROTIME INR (PT): PROTIME: 18 s — ABNORMAL HIGH (ref 10.2–12.9)

## 2022-05-28 LAB — PROCALCITONIN: PROCALCITONIN: 0.1 ng/mL

## 2022-05-28 LAB — HIGH SENSITIVITY TROPONIN I 0 HOUR: HIGH SENSITIVITY TROPONIN I 0 HOUR: 18 ng/L — ABNORMAL LOW (ref <20)

## 2022-05-28 MED ORDER — APIXABAN 2.5 MG PO TAB
2.5 mg | Freq: Two times a day (BID) | ORAL | 0 refills | Status: AC
Start: 2022-05-28 — End: ?
  Administered 2022-05-29 – 2022-05-30 (×4): 2.5 mg via ORAL

## 2022-05-28 MED ORDER — ACETAMINOPHEN 325 MG PO TAB
650 mg | ORAL | 0 refills | Status: AC | PRN
Start: 2022-05-28 — End: ?

## 2022-05-28 MED ORDER — LEVOTHYROXINE 88 MCG PO TAB
88 ug | Freq: Every day | ORAL | 0 refills | Status: AC
Start: 2022-05-28 — End: ?
  Administered 2022-05-29 – 2022-05-30 (×2): 88 ug via ORAL

## 2022-05-28 MED ORDER — MIRABEGRON 25 MG PO TB24
25 mg | Freq: Every day | ORAL | 0 refills | Status: AC
Start: 2022-05-28 — End: ?
  Administered 2022-05-28 – 2022-05-30 (×3): 25 mg via ORAL

## 2022-05-28 MED ORDER — CEFTRIAXONE INJ 2GM IVP
2 g | INTRAVENOUS | 0 refills | Status: AC
Start: 2022-05-28 — End: ?
  Administered 2022-05-29: 19:00:00 2 g via INTRAVENOUS

## 2022-05-28 MED ORDER — FUROSEMIDE 10 MG/ML IJ SOLN
40 mg | Freq: Once | INTRAVENOUS | 0 refills | Status: CP
Start: 2022-05-28 — End: ?
  Administered 2022-05-28: 19:00:00 40 mg via INTRAVENOUS

## 2022-05-28 MED ORDER — PANTOPRAZOLE 40 MG PO TBEC
40 mg | Freq: Every day | ORAL | 0 refills | Status: AC
Start: 2022-05-28 — End: ?
  Administered 2022-05-29 – 2022-05-30 (×2): 40 mg via ORAL

## 2022-05-28 MED ORDER — CLOPIDOGREL 75 MG PO TAB
75 mg | Freq: Every day | ORAL | 0 refills | Status: AC
Start: 2022-05-28 — End: ?
  Administered 2022-05-29 – 2022-05-30 (×2): 75 mg via ORAL

## 2022-05-28 MED ORDER — DUTASTERIDE 0.5 MG PO CAP
.5 mg | Freq: Every day | ORAL | 0 refills | Status: AC
Start: 2022-05-28 — End: ?
  Administered 2022-05-28 – 2022-05-30 (×3): 0.5 mg via ORAL

## 2022-05-28 MED ORDER — IPRATROPIUM-ALBUTEROL 0.5 MG-3 MG(2.5 MG BASE)/3 ML IN NEBU
3 mL | Freq: Once | RESPIRATORY_TRACT | 0 refills | Status: CP
Start: 2022-05-28 — End: ?

## 2022-05-28 MED ORDER — GUAIFENESIN 600 MG PO TA12
1200 mg | Freq: Two times a day (BID) | ORAL | 0 refills | Status: AC
Start: 2022-05-28 — End: ?
  Administered 2022-05-29 – 2022-05-30 (×4): 1200 mg via ORAL

## 2022-05-28 MED ORDER — ATORVASTATIN 40 MG PO TAB
40 mg | Freq: Every evening | ORAL | 0 refills | Status: AC
Start: 2022-05-28 — End: ?
  Administered 2022-05-29 – 2022-05-30 (×2): 40 mg via ORAL

## 2022-05-28 MED ORDER — POLYETHYLENE GLYCOL 3350 17 GRAM PO PWPK
1 | Freq: Every day | ORAL | 0 refills | Status: AC | PRN
Start: 2022-05-28 — End: ?

## 2022-05-28 MED ORDER — CEFTRIAXONE INJ 1GM IVP
1 g | Freq: Once | INTRAVENOUS | 0 refills | Status: CP
Start: 2022-05-28 — End: ?
  Administered 2022-05-28: 19:00:00 1 g via INTRAVENOUS

## 2022-05-28 MED ORDER — FLUTICASONE PROPIONATE 110 MCG/ACTUATION IN HFAA
2 | Freq: Two times a day (BID) | RESPIRATORY_TRACT | 0 refills | Status: DC
Start: 2022-05-28 — End: 2022-05-28

## 2022-05-28 MED ORDER — SENNOSIDES-DOCUSATE SODIUM 8.6-50 MG PO TAB
1 | Freq: Every day | ORAL | 0 refills | Status: AC | PRN
Start: 2022-05-28 — End: ?

## 2022-05-28 MED ORDER — ONDANSETRON HCL (PF) 4 MG/2 ML IJ SOLN
4 mg | INTRAVENOUS | 0 refills | Status: AC | PRN
Start: 2022-05-28 — End: ?

## 2022-05-28 MED ORDER — BUDESONIDE 0.5 MG/2 ML IN NBSP
.5 mg | Freq: Two times a day (BID) | RESPIRATORY_TRACT | 0 refills | Status: AC
Start: 2022-05-28 — End: ?
  Administered 2022-05-29 – 2022-05-30 (×4): 0.5 mg via RESPIRATORY_TRACT

## 2022-05-28 MED ORDER — CARVEDILOL 12.5 MG PO TAB
12.5 mg | Freq: Two times a day (BID) | ORAL | 0 refills | Status: AC
Start: 2022-05-28 — End: ?
  Administered 2022-05-29 – 2022-05-30 (×4): 12.5 mg via ORAL

## 2022-05-28 MED ORDER — ALBUTEROL SULFATE 2.5 MG /3 ML (0.083 %) IN NEBU
2.5 mg | RESPIRATORY_TRACT | 0 refills | Status: DC | PRN
Start: 2022-05-28 — End: 2022-05-29

## 2022-05-28 MED ORDER — ONDANSETRON 4 MG PO TBDI
4 mg | ORAL | 0 refills | Status: AC | PRN
Start: 2022-05-28 — End: ?

## 2022-05-28 MED ORDER — CEFTRIAXONE INJ 1GM IVP
1 g | Freq: Once | INTRAVENOUS | 0 refills | Status: CP
Start: 2022-05-28 — End: ?
  Administered 2022-05-28: 18:00:00 1 g via INTRAVENOUS

## 2022-05-28 MED ORDER — ALBUTEROL SULFATE 2.5 MG /3 ML (0.083 %) IN NEBU
2.5 mg | Freq: Four times a day (QID) | RESPIRATORY_TRACT | 0 refills | Status: AC | PRN
Start: 2022-05-28 — End: ?
  Administered 2022-05-29 – 2022-05-30 (×7): 2.5 mg via RESPIRATORY_TRACT

## 2022-05-28 MED ORDER — MELATONIN 5 MG PO TAB
5 mg | Freq: Every evening | ORAL | 0 refills | Status: AC | PRN
Start: 2022-05-28 — End: ?

## 2022-05-28 MED ORDER — LACTATED RINGERS IV SOLP
500 mL | INTRAVENOUS | 0 refills | Status: CP
Start: 2022-05-28 — End: ?
  Administered 2022-05-28: 18:00:00 500 mL via INTRAVENOUS

## 2022-05-28 MED ADMIN — IPRATROPIUM-ALBUTEROL 0.5 MG-3 MG(2.5 MG BASE)/3 ML IN NEBU [77459]: 3 mL | RESPIRATORY_TRACT | @ 17:00:00 | Stop: 2022-05-28 | NDC 00378967131

## 2022-05-28 NOTE — ED Notes
82 y/o male presents to ED35 c/o lethargy. Pt's daughter states pt has been fighting a cold x1 week and has been on abx and steroids. Pt's daughter states today pt woke up and was more lethargic and SOA than baseline. Pt's daughter gave pt breathing treatment at 0600 but states pt remained lethargic. Upon arrival to triage pt was hunched over in wheelchair and responding slowly to stimuli. Pt found to be 83% on RA, pt placed on 6L/min NC with improving oxygenation status and improving mental status. Pt currently GCS 15, answering all questions appropriately. Pt endorses increased weakness/fatigue. Pt endorses diarrhea. Pt denies CP/SOA. Pt denies any other pain or complaints at this time.   Medical History:   Diagnosis Date    Acute on chronic systolic heart failure, NYHA class 2 (HCC)     Aortic valve stenosis, mild 11/29/2008    Arrhythmia 05/23/2022    new onset afib-sched for cardioversion 05/082024    Arterial occlusion     left kidney-two stents placed    Arthritis     lower back    Asthma     CAD (coronary artery disease) 11/29/2008    Cardiomyopathy (HCC) 11/29/2008    Carotid artery plaque     CHF (congestive heart failure) (HCC)     Colon polyps     COPD (chronic obstructive pulmonary disease) (HCC)     Coronary atherosclerosis     Dizziness     Dyspnea     Frailty     H/O: CVA (cardiovascular accident) 11/29/2008    History of renal artery stenosis 11/29/2008    HTN     Hyperlipidemia     Hyperlipidemia 11/29/2008    Hypertension 11/29/2008    Hypothyroidism     Tobacco abuse     Valvular heart disease

## 2022-05-28 NOTE — Consults
Cardiovascular Medicine    STAFF CARDIOLOGY CONSULTATION NOTE  Admission Date: 05/28/2022  Date of Consultation:  05/28/2022  LOS: 0 days  Requesting Physician: Lauretta Grill, DO  Consulting Physician: Latricia Heft, M.  D.  Code Status: Full Code    Reason for Consultation  Opinion and recommendations regarding extensive cardiovascular disease with recent arrhythmia event      History of Present Illness  This is a 82 y.o. 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 orthostatic hypotension, overall generalized weakness and poor functional status due to physical deconditioning, advanced age and multiple comorbidities.  Seen in cardiology clinic with Dr. Radford Pax on May 1 and noted to be tachycardic with ECG showing SVT consistent with atrial fibrillation.  Initiate anticoagulation and plan for TEE and cardioversion on May 8.     Patient brought in by his daughter, who reports her father has acute onset dyspnea with exertion earlier today and reported hypoxia of 85% at home.  Notes he had issues with aspiration in the past.  Patient is not on home oxygen.  He has had upper respiratory symptoms for the last 7 days and just recently completed a course of doxycycline and prednisone.  Patient states his symptoms had improved significantly with treatment, however had an acute decline this morning.     All pertinent positive and negative review of systems are outlined in the HPI and/or documented in the review of systems section.      Past Medical History  Medical History:   Diagnosis Date    Acute on chronic systolic heart failure, NYHA class 2 (HCC)     Aortic valve stenosis, mild 11/29/2008    Arrhythmia 05/23/2022    new onset afib-sched for cardioversion 05/082024    Arterial occlusion     left kidney-two stents placed    Arthritis     lower back    Asthma     CAD (coronary artery disease) 11/29/2008    Cardiomyopathy (HCC) 11/29/2008    Carotid artery plaque     CHF (congestive heart failure) (HCC)     Colon polyps     COPD (chronic obstructive pulmonary disease) (HCC)     Coronary atherosclerosis     Dizziness     Dyspnea     Frailty     H/O: CVA (cardiovascular accident) 11/29/2008    History of renal artery stenosis 11/29/2008    HTN     Hyperlipidemia     Hyperlipidemia 11/29/2008    Hypertension 11/29/2008    Hypothyroidism     Tobacco abuse     Valvular heart disease          Social History  Social History     Socioeconomic History    Marital status: Widowed   Tobacco Use    Smoking status: Former     Current packs/day: 0.00     Average  packs/day: 1.5 packs/day for 60.0 years (90.0 ttl pk-yrs)     Types: Cigarettes     Start date: 02/13/1958     Quit date: 02/13/2018     Years since quitting: 4.2    Smokeless tobacco: Never    Tobacco comments:     0.5-2 ppd history   Vaping Use    Vaping status: Never Used   Substance and Sexual Activity    Alcohol use: No    Drug use: No    Sexual activity: Not Currently     Partners: Female         Family History  Family History   Problem Relation Age of Onset    Stroke Mother     Other Mother         colon cancer    Other Father         valve disease/COPD       Current Medications  albuterol 0.083% (PROVENTIL) nebulizer solution 2.5 mg, 2.5 mg, Inhalation, Q4H & PRN  apixaban (ELIQUIS) tablet 2.5 mg, 2.5 mg, Oral, BID  atorvastatin (LIPITOR) tablet 40 mg, 40 mg, Oral, QHS  budesonide (PULMICORT) nebulizer solution 0.5 mg, 0.5 mg, Inhalation, BID  carvediloL (COREG) tablet 12.5 mg, 12.5 mg, Oral, BID  [START ON 05/29/2022] cefTRIAXone (ROCEPHIN) IVP 2 g, 2 g, Intravenous, Q24H*  [START ON 05/29/2022] clopiDOGreL (PLAVIX) tablet 75 mg, 75 mg, Oral, QDAY  dutasteride (AVODART) capsule 0.5 mg, 0.5 mg, Oral, QDAY  guaiFENesin LA (MUCINEX) tablet 1,200 mg, 1,200 mg, Oral, BID  [START ON 05/29/2022] levothyroxine (SYNTHROID) tablet 88 mcg, 88 mcg, Oral, QDAY 30 min before breakfast  mirabegron (MYRBETRIQ) ER tablet 25 mg, 25 mg, Oral, QDAY  pantoprazole DR (PROTONIX) tablet 40 mg, 40 mg, Oral, QDAY(21)      acetaminophen Q6H PRN, melatonin QHS PRN, ondansetron Q6H PRN **OR** ondansetron (ZOFRAN) IV Q6H PRN, polyethylene glycol 3350 QDAY PRN, sennosides-docusate sodium QDAY PRN    Home Medications  Medications Prior to Admission   Medication Sig    albuterol (VENTOLIN HFA) 90 mcg/actuation inhaler Inhale two puffs by mouth into the lungs every 6 hours as needed for Wheezing or Shortness of Breath. Shake well before use.    albuterol 0.083% (PROVENTIL) 2.5 mg /3 mL (0.083 %) nebulizer solution Inhale 3 mL solution by nebulizer as directed twice daily as needed.    apixaban (ELIQUIS) 2.5 mg tablet Take one tablet by mouth twice daily.    atorvastatin (LIPITOR) 40 mg tablet Take one tablet by mouth daily.    budesonide (PULMICORT) 0.5 mg/2 mL nebulizer solution Inhale 2 mL solution by nebulizer as directed twice daily.    carvediloL (COREG) 12.5 mg tablet Take one tablet by mouth twice daily. Take with food.    clopiDOGrel (PLAVIX) 75 mg tablet Take one tablet by mouth daily.    doxycycline monohydrate (MONODOX) 100 mg capsule Take one capsule by mouth twice daily.    dutasteride (AVODART) 0.5 mg PO capsule Take one capsule by mouth daily.    food supplemt, lactose-reduced (ENSURE PLUS) 0.05 gram- 1.5 kcal/mL oral liquid Take 1 Can by mouth twice daily with meals.    furosemide (LASIX) 20 mg tablet Take one tablet by mouth daily as needed.    guaifenesin (MUCINEX) 1,200 mg ER tablet Take one tablet by mouth twice daily.    levothyroxine (SYNTHROID) 88 mcg tablet Take one tablet by mouth daily 30 minutes before breakfast.    montelukast (SINGULAIR) 10 mg tablet Take one tablet by  mouth at bedtime daily. (Patient taking differently: Take one tablet by mouth daily.)    MYRBETRIQ 25 mg tablet Take one tablet by mouth daily.    pantoprazole DR (PROTONIX) 40 mg tablet Take one tablet by mouth daily.    predniSONE (DELTASONE) 20 mg tablet Take one tablet by mouth twice daily with meals.       Allergies  No Known Allergies    I reviewed and confirmed this patient's problem list, active medications, allergies, and past medical, social, family & tobacco histories.     Review of Systems  General: negative/normal.  Eyes:  negative/normal.  Ears/Nose/Throat:  negative/normal.  Cardiovascular:  negative/normal.  Respiratory: See HPI  Gastrointestinal:  negative/normal.  Genitourinary:  negative/normal.  Musculoskeletal:  negative/normal.  Skin: negative/normal.   Neurologic:  negative/normal.  Psychiatric:  negative/normal.  Endocrine:  negative/normal.  Heme/Lymphatic: negative/normal.  Allergic/Immunologic:  negative/normal.                             Vital Signs: Most Recent                 Vital Signs: 24 Hour Range   BP: 138/116 (05/06 1624)  Temp: 36.6 ?C (97.8 ?F) (05/06 1606)  Pulse: 81 (05/06 1624)  Respirations: 21 PER MINUTE (05/06 1402)  SpO2: 97 % (05/06 1606)  O2 Device: Nasal cannula (05/06 1606)  O2 Liter Flow: 1 Lpm (05/06 1606) BP: (102-161)/(50-116)   Temp:  [36.6 ?C (97.8 ?F)]   Pulse:  [74-107]   Respirations:  [17 PER MINUTE-21 PER MINUTE]   SpO2:  [83 %-97 %]   O2 Device: Nasal cannula  O2 Liter Flow: 1 Lpm     Vitals:    05/28/22 1031   Weight: 76.2 kg (168 lb)     No intake or output data in the 24 hours ending 05/28/22 1701        Body mass index is 24.11 kg/m?[name] Kitchen    Physical Exam      General Appearance: no acute distress.  Elderly male wearing nasal cannula oxygen  HEENT: Hard of hearing EOMI, mucous membranes moist, oropharynx is clear  Neck Veins: neck veins are flat & not distended  Carotid Arteries: no bruits  Chest Inspection: chest is normal in appearance  Auscultation/Percussion: Coarse breath sounds with faint wheezing.  More pronounced on the left but present bilaterally  Cardiac Rhythm: regular rhythm & normal rate  Cardiac Auscultation: Normal S1 & S2, no S3 or S4, no rub  Murmurs: no cardiac murmurs  Abdominal Exam: soft, non-tender, normal bowel sounds, no masses or bruits  Abdominal aorta: nonpalpable   Liver & Spleen: no organomegaly  Extremities: no lower extremity edema; palpable distal pulses  Skin: Hands are cool.  Mild pale finger nailbeds.  The radial pulses 2+ on the left and 1+ on the right.  2+ bilateral pedal pulses  Neurologic Exam: oriented to time, place and person; no focal neurologic deficits      Labs  Results for orders placed or performed during the hospital encounter of 05/28/22 (from the past 24 hour(s))   POC GLUCOSE    Collection Time: 05/28/22 10:36 AM   Result Value Ref Range    Glucose, POC 100 70 - 100 MG/DL   POC LACTATE    Collection Time: 05/28/22 10:56 AM   Result Value Ref Range    LACTIC ACID POC 1.9 0.5 - 2.0 MMOL/L   POC BLOOD GAS  VEN    Collection Time: 05/28/22 10:56 AM   Result Value Ref Range    PH-VEN-POC 7.35 7.30 - 7.40    PCO2-VEN-POC 56 (H) 36 - 50 MMHG    PO2-VEN-POC 17 (L) 33 - 48 MMHG    Base Ex-VEN-POC 5.0 MMOL/L    O2 Sat-VEN-POC 22.0 (L) 55 - 71 %    Bicarbonate-VEN-POC 30.6 MMOL/L   POC HEMATOCRIT    Collection Time: 05/28/22 10:56 AM   Result Value Ref Range    Hemoglobin POC 15.6 13.5 - 16.5 GM/DL    Hematocrit POC 47.4 40 - 50 %   POC POTASSIUM    Collection Time: 05/28/22 10:56 AM   Result Value Ref Range    Potassium-POC 3.9 3.5 - 5.1 MMOL/L   POC SODIUM    Collection Time: 05/28/22 10:56 AM   Result Value Ref Range Sodium-POC 139 137 - 147 MMOL/L   CBC AND DIFF    Collection Time: 05/28/22 10:58 AM   Result Value Ref Range    White Blood Cells 26.1 (H) 4.5 - 11.0 K/UL    RBC 5.33 4.4 - 5.5 M/UL    Hemoglobin 13.8 13.5 - 16.5 GM/DL    Hematocrit 25.9 40 - 50 %    MCV 79.4 (L) 80 - 100 FL    MCH 25.9 (L) 26 - 34 PG    MCHC 32.6 32.0 - 36.0 G/DL    RDW 56.3 (H) 11 - 15 %    Platelet Count 234 150 - 400 K/UL    MPV 8.8 7 - 11 FL    Neutrophils 82 (H) 41 - 77 %    Lymphocytes 6 (L) 24 - 44 %    Monocytes 12 4 - 12 %    Eosinophils 0 0 - 5 %    Basophils 0 0 - 2 %    Absolute Neutrophil Count 21.49 (H) 1.8 - 7.0 K/UL    Absolute Lymph Count 1.54 1.0 - 4.8 K/UL    Absolute Monocyte Count 3.03 (H) 0 - 0.80 K/UL    Absolute Eosinophil Count 0.03 0 - 0.45 K/UL    Absolute Basophil Count 0.02 0 - 0.20 K/UL    MDW (Monocyte Distribution Width) 20.2 <20.7   COMPREHENSIVE METABOLIC PANEL    Collection Time: 05/28/22 10:58 AM   Result Value Ref Range    Sodium 140 137 - 147 MMOL/L    Potassium 4.0 3.5 - 5.1 MMOL/L    Chloride 102 98 - 110 MMOL/L    Glucose 90 70 - 100 MG/DL    Blood Urea Nitrogen 34 (H) 7 - 25 MG/DL    Creatinine 8.75 (H) 0.4 - 1.24 MG/DL    Calcium 8.9 8.5 - 64.3 MG/DL    Total Protein 6.2 6.0 - 8.0 G/DL    Total Bilirubin 0.8 0.3 - 1.2 MG/DL    Albumin 3.5 3.5 - 5.0 G/DL    Alk Phosphatase 55 25 - 110 U/L    AST (SGOT) 10 7 - 40 U/L    CO2 26 21 - 30 MMOL/L    ALT (SGPT) 7 7 - 56 U/L    Anion Gap 12 3 - 12    eGFR 39 (L) >60 mL/min   COVID-19 (SARS-COV-2) PCR    Collection Time: 05/28/22 10:58 AM    Specimen: Nasopharyngeal; Flocked Swab   Result Value Ref Range    COVID-19 (SARS-CoV-2) PCR Source FLOCKED SWAB  NASOPHARYNGEAL       COVID-19 (  SARS-CoV-2) PCR NOT DETECTED DN-NOT DETECTED   INFLUENZA A/B AND RSV PCR    Collection Time: 05/28/22 10:58 AM    Specimen: Nasopharyngeal; Flocked Swab   Result Value Ref Range    Influenza A Virus NEG NEG-NEG    Influenza B Virus NEG NEG-NEG    RSV NEG NEG-NEG   NT-PRO-BNP Collection Time: 05/28/22 10:58 AM   Result Value Ref Range    NT-Pro-BNP 1,765.0 (H) <450 pg/mL   HIGH SENSITIVITY TROPONIN I 0 HOUR    Collection Time: 05/28/22 10:58 AM   Result Value Ref Range    hs Troponin I 0 Hour 18 <20 ng/L   MAGNESIUM    Collection Time: 05/28/22 10:58 AM   Result Value Ref Range    Magnesium 1.9 1.6 - 2.6 mg/dL   PHOSPHORUS    Collection Time: 05/28/22 10:58 AM   Result Value Ref Range    Phosphorus 2.4 2.0 - 4.5 MG/DL   PROTIME INR (PT)    Collection Time: 05/28/22 10:58 AM   Result Value Ref Range    Protime 18.9 (H) 10.2 - 12.9 SEC    INR 1.8 (H) 0.9 - 1.2   PTT (APTT)    Collection Time: 05/28/22 10:58 AM   Result Value Ref Range    APTT 26.2 24.0 - 36.5 SEC   PROCALCITONIN    Collection Time: 05/28/22 10:58 AM   Result Value Ref Range    Procalcitonin 0.12 ng/mL   IRON + BINDING CAPACITY + %SAT+ FERRITIN    Collection Time: 05/28/22 10:58 AM   Result Value Ref Range    Iron 22 (L) 50 - 185 MCG/DL    Iron Binding-TIBC 161 270 - 380 MCG/DL    % Saturation 6 (L) 28 - 42 %    Ferritin 12 (L) 30 - 300 NG/ML   VITAMIN B12    Collection Time: 05/28/22 10:58 AM   Result Value Ref Range    Vitamin B12 1,341 (H) 180 - 914 PG/ML             Assessment & Plan   Javier Gutierrez is a 82 y.o. patient with the following problems:    Principal Problem:    Acute hypoxemic respiratory failure (HCC)  Active Problems:    Cardiomyopathy (HCC)    Hypertension    Hyperlipidemia    Stage 3 chronic kidney disease (HCC)    @PROBHOSP @  Patient Active Problem List    Diagnosis     (Hosp)  Acute hypoxemic respiratory failure [facility])     Hospital discharge follow-up     Aspiration pneumonia (HCC)     Malaise and fatigue     NSVT (nonsustained ventricular tachycardia) (HCC)     Occlusion of right vertebral artery     Weight gain     COVID-19 vaccine administered     Orthostatic hypotension     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     History of left-sided carotid endarterectomy     Mild persistent asthma without complication     Acute blood loss as cause of postoperative anemia     Dysarthria     Decreased functional mobility and endurance     Impaired mobility and ADLs     Facial droop     Left sided lacunar stroke (HCC)     History of pneumonia     History of pulmonary embolus (PE)     Impaired mobility     Pulmonary  embolism (HCC)     Necrotizing pneumonia (HCC)     Parapneumonic effusion     Pneumonia     Localized edema     S/P TAVR (transcatheter aortic valve replacement)     Chronic diastolic (congestive) heart failure (HCC)     (Hosp)  Stage 3 chronic kidney disease (HCC)     Loss of appetite     PAD (peripheral artery disease) (HCC)     Aortic stenosis     Weight loss     Frailty     Tobacco abuse     Acute on chronic systolic heart failure, NYHA class 2 (HCC)     Palpable abdominal aorta     Systolic murmur of aorta     Weight loss, unintentional     Left anterior fascicular block     Tobacco use disorder     Bilateral carotid artery disease (HCC)     (Hosp)  Cardiomyopathy (HCC)      History of cardiomyopathy - left ventricular systolic function has normalized.      History of renal artery stenosis      A history of renal artery stenosis, status post PTA to left renal artery on 05/2001.      Cerebral embolism with cerebral infarction (HCC)      Left Lacunar Infarct  Left ICA stenosis 75%  Etiology of stroke: small vessel   - MRI with acute infract at junction of lentiform nucleus + post limb of internal capsule  - CTA h+n notable for L ICA stenosis (75%), R ICA stenosis (50%), right vert occlusion  - LDL 129   - A1c- 5.4  - ECHO- EF 55%, Grad 2 LV diastolic dysfunction. No shunt, no thrombus. LA 3.69cm.   - s/p right CEA and  Right ICA stent placement on 1/27.      [facility])  Hypertension     [facility])  Hyperlipidemia     CAD (coronary artery disease)      03/11/15: Cardiac cath ( via Rt radial approach), 30%pLAD, 40%mLAD, 50-60%mLcx, 30-40%pRCA, 30%mRCA followed by 30% distal  - aggressive medial therapy for CAD.      H/O        1. Acute hypoxemic respiratory failure (HCC)    2. Chronic diastolic (congestive) heart failure (HCC)    3. S/P TAVR (transcatheter aortic valve replacement)    4. Chronic obstructive pulmonary disease, unspecified COPD type (HCC)    5.  Recent SVT at outpatient setting  6.  History of CVA with previous carotid artery endarterectomy and stenting    I have reviewed his records.  No unstable cardiovascular findings by history, presentation, or exam.  His proBNP was elevated but based upon his hypoxia and current respiratory illness suspect is more of a strain response to the pulmonary issue than a true heart failure issue.  Maintain on his established cardiovascular medications.  Continue to monitor for redevelopment of the arrhythmia.  Treatment for his respiratory illness.  Maintain on anticoagulation.  At this time I would not advise for escalated diuretic therapy.  Will follow    Staff: Latricia Heft, MD, [facility] date: 05/28/22

## 2022-05-28 NOTE — Progress Notes
05/28/22 1617 05/28/22 1620 05/28/22 1624   Vitals   Pulse 74 88 81   BP (!) 161/84 112/65 (!) 138/116   Mean NBP (Calculated) 110 MM HG 81 MM HG 123 MM HG   BP Source Arm, Right Upper Arm, Right Upper Arm, Right Upper   BP Method Automatic Automatic Automatic   BP Patient Position Orthostatic - Supine Orthostatic - Sitting Orthostatic - Standing 1 min     Orthostatic vitals

## 2022-05-28 NOTE — Progress Notes
05/28/22 1617 05/28/22 1620 05/28/22 1624   Vitals   BP (!) 161/84 112/65 (!) 138/116   Mean NBP (Calculated) 110 MM HG 81 MM HG 123 MM HG   BP Source Arm, Right Upper Arm, Right Upper Arm, Right Upper   BP Method Automatic Automatic Automatic   BP Patient Position Orthostatic - Supine Orthostatic - Sitting Orthostatic - Standing 1 min     Orthostatics vitals

## 2022-05-29 ENCOUNTER — Encounter: Admit: 2022-05-29 | Discharge: 2022-05-29 | Payer: MEDICARE

## 2022-05-29 DIAGNOSIS — R0600 Dyspnea, unspecified: Secondary | ICD-10-CM

## 2022-05-29 DIAGNOSIS — I499 Cardiac arrhythmia, unspecified: Secondary | ICD-10-CM

## 2022-05-29 DIAGNOSIS — I2510 Atherosclerotic heart disease of native coronary artery without angina pectoris: Secondary | ICD-10-CM

## 2022-05-29 DIAGNOSIS — I7090 Unspecified atherosclerosis: Secondary | ICD-10-CM

## 2022-05-29 DIAGNOSIS — I429 Cardiomyopathy, unspecified: Secondary | ICD-10-CM

## 2022-05-29 DIAGNOSIS — I5023 Acute on chronic systolic (congestive) heart failure: Secondary | ICD-10-CM

## 2022-05-29 DIAGNOSIS — J45909 Unspecified asthma, uncomplicated: Secondary | ICD-10-CM

## 2022-05-29 DIAGNOSIS — Z720 Tobacco use: Secondary | ICD-10-CM

## 2022-05-29 DIAGNOSIS — M1990 Unspecified osteoarthritis, unspecified site: Secondary | ICD-10-CM

## 2022-05-29 DIAGNOSIS — I6529 Occlusion and stenosis of unspecified carotid artery: Secondary | ICD-10-CM

## 2022-05-29 DIAGNOSIS — R42 Dizziness and giddiness: Secondary | ICD-10-CM

## 2022-05-29 DIAGNOSIS — J449 Chronic obstructive pulmonary disease, unspecified: Secondary | ICD-10-CM

## 2022-05-29 DIAGNOSIS — I38 Endocarditis, valve unspecified: Secondary | ICD-10-CM

## 2022-05-29 DIAGNOSIS — I350 Nonrheumatic aortic (valve) stenosis: Secondary | ICD-10-CM

## 2022-05-29 DIAGNOSIS — R54 Age-related physical debility: Secondary | ICD-10-CM

## 2022-05-29 DIAGNOSIS — I509 Heart failure, unspecified: Secondary | ICD-10-CM

## 2022-05-29 DIAGNOSIS — Z8679 Personal history of other diseases of the circulatory system: Secondary | ICD-10-CM

## 2022-05-29 DIAGNOSIS — I10 Essential (primary) hypertension: Secondary | ICD-10-CM

## 2022-05-29 DIAGNOSIS — E785 Hyperlipidemia, unspecified: Secondary | ICD-10-CM

## 2022-05-29 DIAGNOSIS — E039 Hypothyroidism, unspecified: Secondary | ICD-10-CM

## 2022-05-29 DIAGNOSIS — K635 Polyp of colon: Secondary | ICD-10-CM

## 2022-05-30 ENCOUNTER — Encounter: Admit: 2022-05-30 | Discharge: 2022-05-30 | Payer: MEDICARE

## 2022-05-30 MED ADMIN — WATER FOR INJECTION, STERILE IJ SOLN [79513]: 20 mL | INTRAVENOUS | @ 15:00:00 | Stop: 2022-05-30 | NDC 00409488723

## 2022-05-30 MED ADMIN — POTASSIUM CHLORIDE 20 MEQ/15 ML PO LIQD [6432]: 20 meq | ORAL | @ 13:00:00 | Stop: 2022-05-30 | NDC 00904706188

## 2022-05-30 MED ADMIN — CEFTRIAXONE INJ 2GM IVP [210254]: 2 g | INTRAVENOUS | @ 15:00:00 | Stop: 2022-05-30 | NDC 60505614900

## 2022-06-01 ENCOUNTER — Encounter: Admit: 2022-06-01 | Discharge: 2022-06-01 | Payer: MEDICARE

## 2022-06-06 ENCOUNTER — Encounter: Admit: 2022-06-06 | Discharge: 2022-06-06 | Payer: MEDICARE

## 2022-06-06 NOTE — Telephone Encounter
Pt's daughter called and states that she thinks Javier Gutierrez may be back in afib.  She states that he was originally scheduled for a TEE/DCCV, but that was cancelled as he was admitted to [facility] for a respiratory issue and he was in NSR.  She states that his heart rate has been going all over the place with heart rates from the 60-110s.  His BP is running around 140/70s.  She states that he is doing okay - he gets short of breath with exertion, but denies any worsening fatigue, chest pain or palpitations.  She verified that Javier Gutierrez is taking is Eliquis and Coreg as prescribed.

## 2022-06-13 ENCOUNTER — Encounter: Admit: 2022-06-13 | Discharge: 2022-06-13 | Payer: MEDICARE

## 2022-06-14 ENCOUNTER — Encounter: Admit: 2022-06-14 | Discharge: 2022-06-14 | Payer: MEDICARE

## 2022-06-14 DIAGNOSIS — I38 Endocarditis, valve unspecified: Secondary | ICD-10-CM

## 2022-06-14 DIAGNOSIS — J984 Other disorders of lung: Secondary | ICD-10-CM

## 2022-06-14 DIAGNOSIS — K635 Polyp of colon: Secondary | ICD-10-CM

## 2022-06-14 DIAGNOSIS — E859 Amyloidosis, unspecified: Secondary | ICD-10-CM

## 2022-06-14 DIAGNOSIS — I10 Essential (primary) hypertension: Secondary | ICD-10-CM

## 2022-06-14 DIAGNOSIS — I7090 Unspecified atherosclerosis: Secondary | ICD-10-CM

## 2022-06-14 DIAGNOSIS — I5023 Acute on chronic systolic (congestive) heart failure: Secondary | ICD-10-CM

## 2022-06-14 DIAGNOSIS — E7800 Pure hypercholesterolemia, unspecified: Secondary | ICD-10-CM

## 2022-06-14 DIAGNOSIS — R0989 Other specified symptoms and signs involving the circulatory and respiratory systems: Secondary | ICD-10-CM

## 2022-06-14 DIAGNOSIS — R0600 Dyspnea, unspecified: Secondary | ICD-10-CM

## 2022-06-14 DIAGNOSIS — N289 Disorder of kidney and ureter, unspecified: Secondary | ICD-10-CM

## 2022-06-14 DIAGNOSIS — I2510 Atherosclerotic heart disease of native coronary artery without angina pectoris: Secondary | ICD-10-CM

## 2022-06-14 DIAGNOSIS — I2699 Other pulmonary embolism without acute cor pulmonale: Secondary | ICD-10-CM

## 2022-06-14 DIAGNOSIS — E785 Hyperlipidemia, unspecified: Secondary | ICD-10-CM

## 2022-06-14 DIAGNOSIS — I350 Nonrheumatic aortic (valve) stenosis: Secondary | ICD-10-CM

## 2022-06-14 DIAGNOSIS — J449 Chronic obstructive pulmonary disease, unspecified: Secondary | ICD-10-CM

## 2022-06-14 DIAGNOSIS — I6529 Occlusion and stenosis of unspecified carotid artery: Secondary | ICD-10-CM

## 2022-06-14 DIAGNOSIS — I499 Cardiac arrhythmia, unspecified: Secondary | ICD-10-CM

## 2022-06-14 DIAGNOSIS — I6312 Cerebral infarction due to embolism of basilar artery: Secondary | ICD-10-CM

## 2022-06-14 DIAGNOSIS — I5032 Chronic diastolic (congestive) heart failure: Secondary | ICD-10-CM

## 2022-06-14 DIAGNOSIS — I951 Orthostatic hypotension: Secondary | ICD-10-CM

## 2022-06-14 DIAGNOSIS — I509 Heart failure, unspecified: Secondary | ICD-10-CM

## 2022-06-14 DIAGNOSIS — J45909 Unspecified asthma, uncomplicated: Secondary | ICD-10-CM

## 2022-06-14 DIAGNOSIS — Z8679 Personal history of other diseases of the circulatory system: Secondary | ICD-10-CM

## 2022-06-14 DIAGNOSIS — I444 Left anterior fascicular block: Secondary | ICD-10-CM

## 2022-06-14 DIAGNOSIS — I739 Peripheral vascular disease, unspecified: Secondary | ICD-10-CM

## 2022-06-14 DIAGNOSIS — I429 Cardiomyopathy, unspecified: Secondary | ICD-10-CM

## 2022-06-14 DIAGNOSIS — Z952 Presence of prosthetic heart valve: Secondary | ICD-10-CM

## 2022-06-14 DIAGNOSIS — M1990 Unspecified osteoarthritis, unspecified site: Secondary | ICD-10-CM

## 2022-06-14 DIAGNOSIS — E039 Hypothyroidism, unspecified: Secondary | ICD-10-CM

## 2022-06-14 DIAGNOSIS — Z9889 Other specified postprocedural states: Secondary | ICD-10-CM

## 2022-06-14 DIAGNOSIS — R54 Age-related physical debility: Secondary | ICD-10-CM

## 2022-06-14 DIAGNOSIS — I4729 NSVT (nonsustained ventricular tachycardia) (HCC): Secondary | ICD-10-CM

## 2022-06-14 DIAGNOSIS — I6501 Occlusion and stenosis of right vertebral artery: Secondary | ICD-10-CM

## 2022-06-14 DIAGNOSIS — Z720 Tobacco use: Secondary | ICD-10-CM

## 2022-06-14 DIAGNOSIS — R42 Dizziness and giddiness: Secondary | ICD-10-CM

## 2022-06-14 MED ORDER — METOPROLOL TARTRATE 25 MG PO TAB
25 mg | ORAL_TABLET | Freq: Two times a day (BID) | ORAL | 3 refills | 90.00000 days | Status: DC
Start: 2022-06-14 — End: 2022-06-14

## 2022-06-14 MED ORDER — METOPROLOL SUCCINATE 25 MG PO TB24
25 mg | ORAL_TABLET | Freq: Two times a day (BID) | ORAL | 3 refills | 90.00000 days | Status: AC
Start: 2022-06-14 — End: ?

## 2022-06-14 NOTE — Progress Notes
Date of Service: 06/14/2022    Javier Gutierrez is a 82 y.o. male.       HPI      Javier Gutierrez is a 82 y.o.  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    When patient was last seen in the office on 05/23/2022, incidentally he was on to be in atrial fibrillation of unknown duration, I the carvedilol was adjusted, patient was initiated on apixaban and he was scheduled to undergo a TEE guided cardioversion.    Meantime, on 05/28/2022 he did become acutely short of breath, perhaps secondary to a mucous plug in his respiratory tract, he was admitted at [facility], patient was initiated on IV antibiotics, Mucinex, he was given Lasix upon admission, patient was in normal sinus rhythm at that time and the TEE guided cardioversion scheduled for 05/30/2022 was discontinued.    Since home, upon monitoring of the blood pressure and heart rate, it was noticed to have episodes of tachycardia up to 125 bpm.  The carvedilol was increased to 12.5 mg p.o. twice daily with some improvement.  Apparently, according to his daughter's report based on home health care nurse statements, he did have some episodes of orthostatic hypotension, but it was not frank presyncope or syncope.    Patient himself does not have any symptoms of shortness of breath, no chest pain and no symptomatic heart palpitations.    In July 2022 patient did sustain a stroke he was found to have 75% stenosis of the left ICA, he did undergo carotid endarterectomy, intraoperatively he was found to have persistent stenosis distal to the operated area and underwent stent placement, patient has been on clopidogrel ever since.     A carotid artery duplex dated 11/22/2021-no significant stenosis in bilateral common and internal carotid arteries, left internal carotid stent was visualized, it was patent, mild plaque in both carotid arteries.     A 2D echo Doppler study dated 05/23/2022 demonstrated LVEF = 45%, normal Taber MG = 4 mmHg, no other significant valvular abnormalities, inferior wall hypokinesis, no pericardial effusion.           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    Weight gain  07/02/2019    COVID-19 vaccine administered 07/02/2019    Orthostatic hypotension 07/02/2019    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    History of left-sided carotid endarterectomy 06/06/2018    Mild persistent asthma without complication 05/21/2018    Acute blood loss as cause of postoperative anemia 02/24/2018    Dysarthria 02/21/2018    Decreased functional mobility and endurance 02/21/2018    Impaired mobility and ADLs 02/21/2018    Facial droop 02/21/2018    Left sided lacunar stroke (HCC) 02/21/2018    History of pneumonia 01/24/2017    History of pulmonary embolus (PE) 01/24/2017    Impaired mobility 12/12/2016    Pulmonary embolism (HCC) 12/12/2016    Necrotizing pneumonia (HCC) 12/12/2016    Parapneumonic effusion 11/21/2016    Pneumonia 11/20/2016    Localized edema 12/13/2015    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    Chronic diastolic (congestive) heart failure (HCC) 08/02/2015    Stage 3 chronic kidney disease (HCC) 04/05/2015    Loss of appetite 04/05/2015    PAD (peripheral artery disease) (HCC) 04/05/2015    Aortic stenosis 03/11/2015    Weight loss 03/10/2015    Frailty     Tobacco abuse     Acute on chronic systolic heart failure, NYHA class 2 (HCC)     Palpable abdominal aorta 01/25/2015    Systolic murmur of aorta 04/29/2014    Weight loss, unintentional 05/01/2012    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      History of renal artery stenosis 11/29/2008     A history of renal artery stenosis, status post PTA to left renal artery on 05/2001.      Cerebral embolism with cerebral infarction (HCC) 11/29/2008     Left Lacunar Infarct  Left ICA stenosis 75%  Etiology of stroke: small vessel   - MRI with acute infract at junction of lentiform nucleus + post limb of internal capsule  - CTA h+n notable for L ICA stenosis (75%), R ICA stenosis (50%), right vert occlusion  - LDL 129   - A1c- 5.4  - ECHO- EF 55%, Grad 2 LV diastolic dysfunction. No shunt, no thrombus. LA 3.69cm.   - s/p right CEA and  Right ICA stent placement on 1/27.      Hypertension 11/29/2008    Hyperlipidemia 11/29/2008    CAD (coronary artery disease) 11/29/2008     03/11/15: Cardiac cath ( via Rt radial approach), 30%pLAD, 40%mLAD, 50-60%mLcx, 30-40%pRCA, 30%mRCA followed by 30% distal  - aggressive medial therapy for CAD.      H/O 11/29/2008         Review of Systems Constitutional: Negative.   HENT: Negative.     Eyes: Negative.    Cardiovascular: Negative.    Respiratory: Negative.     Endocrine: Negative.    Hematologic/Lymphatic: Negative.    Skin: Negative.    Musculoskeletal: Negative.    Gastrointestinal: Negative.    Genitourinary: Negative.    Neurological: Negative.    Psychiatric/Behavioral: Negative.     Allergic/Immunologic: Negative.        Physical Exam  General Appearance: normal in appearance  Skin: warm, moist, no ulcers or xanthomas  Eyes: conjunctivae and lids normal, pupils are equal and round  Lips & Oral Mucosa: no pallor or cyanosis  Neck Veins: neck veins are flat, neck veins are not distended  Chest Inspection: chest is normal in appearance  Respiratory Effort: breathing comfortably, no respiratory distress  Auscultation/Percussion: lungs clear to auscultation, no rales or rhonchi, no wheezing  Cardiac Rhythm: regular rhythm and normal rate  Cardiac Auscultation: S1, S2 normal, no rub, no gallop  Murmurs:  II/VI systolic  murmur    Carotid Arteries: normal carotid upstroke bilaterally,  L CEA, bilateral bruits   Lower Extremity Edema: no lower extremity edema  Abdominal Exam: soft, non-tender, no masses, bowel sounds normal  Liver & Spleen: no organomegaly  Language and Memory: patient responsive and seems to comprehend information  Neurologic Exam: neurological assessment grossly intact      Cardiovascular Studies  Twelve-lead EKG demonstrates normal sinus rhythm with PACs, RBBB, LAD, LAFB, ventricular rate 69 bpm.    Cardiovascular Health Factors  Vitals BP Readings from Last 3 Encounters:   06/14/22 105/71   05/30/22 136/72   05/23/22 118/62     Wt Readings from Last 3 Encounters:   06/14/22 76.2 kg (168 lb)   05/28/22 74.5 kg (164 lb 3.9 oz)   05/23/22 76.2 kg (168 lb)     BMI Readings from Last 3 Encounters:   06/14/22 24.11 kg/m?   05/28/22 23.57 kg/m?   05/23/22 24.11 kg/m?      Smoking Social History     Tobacco Use   Smoking Status Former Current packs/day: 0.00    Average packs/day: 1.5 packs/day for 60.0 years (90.0 ttl pk-yrs)    Types: Cigarettes    Start date: 02/13/1958    Quit date: 02/13/2018    Years since quitting: 4.3   Smokeless Tobacco Never   Tobacco Comments    0.5-2 ppd history      Lipid Profile Cholesterol   Date Value Ref Range Status   04/02/2019 124  Final     HDL   Date Value Ref Range Status   04/02/2019 28 (L) >40 Final     LDL   Date Value Ref Range Status   04/02/2019 60  Final     Triglycerides   Date Value Ref Range Status   04/02/2019 179 (H) <150 Final      Blood Sugar Hemoglobin A1C   Date Value Ref Range Status   05/28/2022 5.5 4.0 - 5.7 % Final     Comment:     The ADA recommends that most patients with type 1 and type 2 diabetes maintain   an A1c level <7%.       Glucose   Date Value Ref Range Status   05/30/2022 85 70 - 100 MG/DL Final   16/10/9602 94 70 - 100 MG/DL Final   54/09/8117 90 70 - 100 MG/DL Final     Glucose, POC   Date Value Ref Range Status   05/28/2022 100 70 - 100 MG/DL Final   14/78/2956 87 70 - 100 MG/DL Final   21/30/8657 846 (H) 70 - 100 MG/DL Final          Problems Addressed Today  Encounter Diagnoses   Name Primary?    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  Hospital discharge follow-up at [facility] between 5/6 - 05/30/2022  Patient was admitted with symptoms of acute hypoxic respiratory failure, he was treated with IV antibiotics, given IV Lasix  During this admission patient was found to be in normal sinus rhythm and a TEE guided cardioversion initially scheduled for 05/30/2022 was canceled  2.  Episodes of paroxysmal tachycardia  Very likely due to paroxysmal atrial fibrillation  Patient is currently on carvedilol and anticoagulated with apixaban 2.5 mg p.o. twice daily  3. History of CVA-this occurred in July 2020  4.  History of severe left carotid artery stenosis  5.  Status post left CEA  Intraoperatively patient was found to have a stenosis distal to the operated area, he also underwent stent placement-he recovered well following the CVA  6.  History of severe aortic valve stenosis  7.  Status post TAVR, patient received #29 SAPIEN S3 valve in March 2021-normal function, last MG = 4 mmHg on an echocardiogram performed in 05/23/2022  8.  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  Nonobstructive coronary artery disease  9.  Occasional orthostatic hypotension- patient is currently on carvedilol  11.  Hyperlipidemia-on atorvastatin  12.  Abnormal EKG, bifascicular block-RBBB, LAD, LAFB  13.  Overall general weakness, debility due to multiple comorbidities and physical deconditioning  14.  HFrEF-LVEF 45% by the last echocardiogram dated 05/23/2022  Currently patient on carvedilol for rate control of the atrial arrhythmia, has episodes of relative hypotension.      Plan:    1.  Discontinue carvedilol  2.  Start metoprolol XL 25 mg p.o. twice daily  3.  Can decrease the atorvastatin to 20 mg p.o. daily  4.  Continue home PT, implement the schedule for breathing exercises 3 times a day with deep inspirations  5.  Follow-up office visit in 3 months.      Total Time Today was 40 minutes in the following activities: Preparing to see the patient, Obtaining and/or reviewing separately obtained history, Performing a medically appropriate examination and/or evaluation, Counseling and educating the patient/family/caregiver, Ordering medications, tests, or procedures, Referring and communication with other health care professionals (when not separately reported), Documenting clinical information in the electronic or other health record, Independently interpreting results (not separately reported) and communicating results to the patient/family/caregiver, and Care coordination (not separately reported)             Current Medications (including today's revisions)   albuterol (VENTOLIN HFA) 90 mcg/actuation inhaler Inhale two puffs by mouth into the lungs every 6 hours as needed for Wheezing or Shortness of Breath. Shake well before use.    albuterol 0.083% (PROVENTIL) 2.5 mg /3 mL (0.083 %) nebulizer solution Inhale 3 mL solution by nebulizer as directed twice daily as needed.    apixaban (ELIQUIS) 2.5 mg tablet Take one tablet by mouth twice daily.    atorvastatin (LIPITOR) 40 mg tablet Take one tablet by mouth daily.  budesonide (PULMICORT) 0.5 mg/2 mL nebulizer solution Inhale 2 mL solution by nebulizer as directed twice daily.    carvediloL (COREG) 25 mg tablet Take one tablet by mouth twice daily with meals. Take with food.    clopiDOGrel (PLAVIX) 75 mg tablet Take one tablet by mouth daily.    dutasteride (AVODART) 0.5 mg PO capsule Take one capsule by mouth daily.    food supplemt, lactose-reduced (ENSURE PLUS) 0.05 gram- 1.5 kcal/mL oral liquid Take 1 Can by mouth twice daily with meals.    furosemide (LASIX) 20 mg tablet Take one tablet by mouth daily as needed.    guaifenesin (MUCINEX) 1,200 mg ER tablet Take one tablet by mouth twice daily.    levothyroxine (SYNTHROID) 88 mcg tablet Take one tablet by mouth daily 30 minutes before breakfast.    montelukast (SINGULAIR) 10 mg tablet Take one tablet by mouth at bedtime daily. (Patient taking differently: Take one tablet by mouth daily.)    MYRBETRIQ 25 mg tablet Take one tablet by mouth daily.    pantoprazole DR (PROTONIX) 40 mg tablet Take one tablet by mouth daily.

## 2022-06-14 NOTE — Patient Instructions
Thank you for visiting our office today.    We would like to make the following medication adjustments:    Stop Coreg  Start ToprolXL 25mg  twice daily       Otherwise continue the same medications as you have been doing.          We will be pursuing the following tests after your appointment today:       Orders Placed This Encounter    ECG 12-LEAD    metoprolol succinate XL (TOPROL XL) 25 mg extended release tablet         We will plan to see you back in 3 months.  Please call us in the meantime with any questions or concerns.        Please allow 5-7 business days for our providers to review your results. All normal results will go to MyChart. If you do not have Mychart, it is strongly recommended to get this so you can easily view all your results. If you do not have mychart, we will attempt to call you once with normal lab and testing results. If we cannot reach you by phone with normal results, we will send you a letter.  If you have not heard the results of your testing after one week please give [place] a call.       Your Cardiovascular Medicine [place] Team Brett Canales, Pilar Jarvis, Shawna Orleans, and [place])  phone number is [phone].

## 2022-08-20 ENCOUNTER — Encounter: Admit: 2022-08-20 | Discharge: 2022-08-20 | Payer: MEDICARE

## 2022-08-23 ENCOUNTER — Encounter: Admit: 2022-08-23 | Discharge: 2022-08-23 | Payer: MEDICARE

## 2022-08-28 ENCOUNTER — Encounter: Admit: 2022-08-28 | Discharge: 2022-08-28 | Payer: MEDICARE

## 2022-08-28 DIAGNOSIS — R54 Age-related physical debility: Secondary | ICD-10-CM

## 2022-08-28 DIAGNOSIS — I429 Cardiomyopathy, unspecified: Secondary | ICD-10-CM

## 2022-08-28 DIAGNOSIS — R0600 Dyspnea, unspecified: Secondary | ICD-10-CM

## 2022-08-28 DIAGNOSIS — E859 Amyloidosis, unspecified: Secondary | ICD-10-CM

## 2022-08-28 DIAGNOSIS — Z8679 Personal history of other diseases of the circulatory system: Secondary | ICD-10-CM

## 2022-08-28 DIAGNOSIS — I951 Orthostatic hypotension: Secondary | ICD-10-CM

## 2022-08-28 DIAGNOSIS — I6529 Occlusion and stenosis of unspecified carotid artery: Secondary | ICD-10-CM

## 2022-08-28 DIAGNOSIS — K635 Polyp of colon: Secondary | ICD-10-CM

## 2022-08-28 DIAGNOSIS — J984 Other disorders of lung: Secondary | ICD-10-CM

## 2022-08-28 DIAGNOSIS — I4729 NSVT (nonsustained ventricular tachycardia) (HCC): Secondary | ICD-10-CM

## 2022-08-28 DIAGNOSIS — I499 Cardiac arrhythmia, unspecified: Secondary | ICD-10-CM

## 2022-08-28 DIAGNOSIS — I6501 Occlusion and stenosis of right vertebral artery: Secondary | ICD-10-CM

## 2022-08-28 DIAGNOSIS — F17200 Nicotine dependence, unspecified, uncomplicated: Secondary | ICD-10-CM

## 2022-08-28 DIAGNOSIS — I2510 Atherosclerotic heart disease of native coronary artery without angina pectoris: Secondary | ICD-10-CM

## 2022-08-28 DIAGNOSIS — I10 Essential (primary) hypertension: Secondary | ICD-10-CM

## 2022-08-28 DIAGNOSIS — I509 Heart failure, unspecified: Secondary | ICD-10-CM

## 2022-08-28 DIAGNOSIS — I6523 Occlusion and stenosis of bilateral carotid arteries: Secondary | ICD-10-CM

## 2022-08-28 DIAGNOSIS — J449 Chronic obstructive pulmonary disease, unspecified: Secondary | ICD-10-CM

## 2022-08-28 DIAGNOSIS — E7800 Pure hypercholesterolemia, unspecified: Secondary | ICD-10-CM

## 2022-08-28 DIAGNOSIS — I5032 Chronic diastolic (congestive) heart failure: Secondary | ICD-10-CM

## 2022-08-28 DIAGNOSIS — Z8701 Personal history of pneumonia (recurrent): Secondary | ICD-10-CM

## 2022-08-28 DIAGNOSIS — Z952 Presence of prosthetic heart valve: Secondary | ICD-10-CM

## 2022-08-28 DIAGNOSIS — I739 Peripheral vascular disease, unspecified: Secondary | ICD-10-CM

## 2022-08-28 DIAGNOSIS — I6312 Cerebral infarction due to embolism of basilar artery: Secondary | ICD-10-CM

## 2022-08-28 DIAGNOSIS — J45909 Unspecified asthma, uncomplicated: Secondary | ICD-10-CM

## 2022-08-28 DIAGNOSIS — Z720 Tobacco use: Secondary | ICD-10-CM

## 2022-08-28 DIAGNOSIS — I444 Left anterior fascicular block: Secondary | ICD-10-CM

## 2022-08-28 DIAGNOSIS — I7090 Unspecified atherosclerosis: Secondary | ICD-10-CM

## 2022-08-28 DIAGNOSIS — M1990 Unspecified osteoarthritis, unspecified site: Secondary | ICD-10-CM

## 2022-08-28 DIAGNOSIS — I5023 Acute on chronic systolic (congestive) heart failure: Secondary | ICD-10-CM

## 2022-08-28 DIAGNOSIS — N183 Stage 3 chronic kidney disease, unspecified whether stage 3a or 3b CKD (HCC): Secondary | ICD-10-CM

## 2022-08-28 DIAGNOSIS — I350 Nonrheumatic aortic (valve) stenosis: Secondary | ICD-10-CM

## 2022-08-28 DIAGNOSIS — Z9889 Other specified postprocedural states: Secondary | ICD-10-CM

## 2022-08-28 DIAGNOSIS — E039 Hypothyroidism, unspecified: Secondary | ICD-10-CM

## 2022-08-28 DIAGNOSIS — Z86711 Personal history of pulmonary embolism: Secondary | ICD-10-CM

## 2022-08-28 DIAGNOSIS — I2699 Other pulmonary embolism without acute cor pulmonale: Secondary | ICD-10-CM

## 2022-08-28 DIAGNOSIS — N289 Disorder of kidney and ureter, unspecified: Secondary | ICD-10-CM

## 2022-08-28 DIAGNOSIS — R42 Dizziness and giddiness: Secondary | ICD-10-CM

## 2022-08-28 DIAGNOSIS — E785 Hyperlipidemia, unspecified: Secondary | ICD-10-CM

## 2022-08-28 DIAGNOSIS — Z7409 Other reduced mobility: Secondary | ICD-10-CM

## 2022-08-28 DIAGNOSIS — I38 Endocarditis, valve unspecified: Secondary | ICD-10-CM

## 2022-08-28 NOTE — Progress Notes
Date of Service: 08/28/2022    Ron Haab is a 82 y.o. male.       HPI     Marque Guilmette is a 82 y.o.  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     In May 2024 patient was found to be in atrial fibrillation of unknown duration, he was initiated on apixaban, and he was scheduled to undergo a TEE guided cardioversion.      Following that on May 28, 2022 patient did develop acute shortness of breath, thought to be secondary to mucous plug in his respiratory tract, he was admitted at [facility], he was initiated on IV antibiotics, during that admission patient was noted to be in NSR and the initial TEE guided cardioversion is scheduled for 05/30/2022 was discontinued.      In July 2022 patient did sustain a stroke he was found to have 75% stenosis of the left ICA, he did undergo carotid endarterectomy, intraoperatively he was found to have persistent stenosis distal to the operated area and underwent stent placement, patient has been on clopidogrel ever since.     A carotid artery duplex dated 11/22/2021-no significant stenosis in bilateral common and internal carotid arteries, left internal carotid stent was visualized, it was patent, mild plaque in both carotid arteries.      A 2D echo Doppler study dated 05/23/2022 demonstrated LVEF = 45%, normal Taber MG = 4 mmHg, no other significant valvular abnormalities, inferior wall hypokinesis, no pericardial effusion.    At the last office visit on 06/11/2022 patient did report symptoms of fatigue, and relative hypotension, the medications were adjusted, he was taken off carvedilol and started on metoprolol XL.    At present time he appears to do very well from a cardiac standpoint.  His daughter is with him at every appointment.  They live together.  Patient's daughter states that he did physical therapy and that helped tremendously with physical strengthening and overall functioning ability.               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    Weight gain 07/02/2019    COVID-19 vaccine administered 07/02/2019    Orthostatic hypotension 07/02/2019    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    History of left-sided carotid endarterectomy 06/06/2018    Mild persistent asthma without complication 05/21/2018    Acute blood loss as cause of postoperative anemia 02/24/2018    Dysarthria 02/21/2018    Decreased functional mobility and endurance 02/21/2018    Impaired mobility and ADLs 02/21/2018    Facial droop 02/21/2018    Left sided lacunar stroke (HCC) 02/21/2018    History of pneumonia 01/24/2017    History of pulmonary embolus (PE) 01/24/2017    Impaired mobility 12/12/2016    Pulmonary embolism (HCC) 12/12/2016    Necrotizing pneumonia (HCC) 12/12/2016    Parapneumonic effusion 11/21/2016    Pneumonia 11/20/2016    Localized edema 12/13/2015    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    Chronic diastolic (congestive) heart failure (HCC) 08/02/2015    Stage 3 chronic kidney disease (HCC) 04/05/2015    Loss of appetite 04/05/2015    PAD (peripheral artery disease) (HCC) 04/05/2015    Aortic stenosis 03/11/2015    Weight loss 03/10/2015    Frailty     Tobacco abuse     Acute on chronic systolic heart failure, NYHA class 2 (HCC)     Palpable abdominal aorta 01/25/2015    Systolic murmur of aorta 04/29/2014    Weight loss, unintentional 05/01/2012    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      History of renal artery stenosis 11/29/2008     A history of renal artery stenosis, status post PTA to left renal artery on 05/2001.      Cerebral embolism with cerebral infarction (HCC) 11/29/2008     Left Lacunar Infarct  Left ICA stenosis 75%  Etiology of stroke: small vessel   - MRI with acute infract at junction of lentiform nucleus + post limb of internal capsule  - CTA h+n notable for L ICA stenosis (75%), R ICA stenosis (50%), right vert occlusion  - LDL 129   - A1c- 5.4  - ECHO- EF 55%, Grad 2 LV diastolic dysfunction. No shunt, no thrombus. LA 3.69cm.   - s/p right CEA and  Right ICA stent placement on 1/27.      Hypertension 11/29/2008    Hyperlipidemia 11/29/2008    CAD (coronary artery disease) 11/29/2008     03/11/15: Cardiac cath ( via Rt radial approach), 30%pLAD, 40%mLAD, 50-60%mLcx, 30-40%pRCA, 30%mRCA followed by 30% distal  - aggressive medial therapy for CAD.      H/O 11/29/2008         Review of Systems   Constitutional: Negative.   HENT: Negative.     Eyes: Negative.    Cardiovascular: Negative.    Respiratory: Negative.     Endocrine: Negative.    Hematologic/Lymphatic: Negative. Skin: Negative.    Musculoskeletal: Negative.    Gastrointestinal: Negative.    Genitourinary: Negative.    Neurological: Negative.    Psychiatric/Behavioral: Negative.     Allergic/Immunologic: Negative.        Physical Exam  General Appearance: normal in appearance  Skin: warm, moist, no ulcers or xanthomas  Eyes: conjunctivae and lids normal, pupils are equal and round  Lips & Oral Mucosa: no pallor or cyanosis  Neck Veins: neck veins are flat, neck veins are not distended  Chest Inspection: chest is  normal in appearance  Respiratory Effort: breathing comfortably, no respiratory distress  Auscultation/Percussion: lungs clear to auscultation, no rales or rhonchi, no wheezing  Cardiac Rhythm: regular rhythm and normal rate  Cardiac Auscultation: S1, S2 normal, no rub, no gallop  Murmurs:  II/VI systolic  murmur    Carotid Arteries: normal carotid upstroke bilaterally,  L CEA, bilateral bruits   Lower Extremity Edema: no lower extremity edema  Abdominal Exam: soft, non-tender, no masses, bowel sounds normal  Liver & Spleen: no organomegaly  Language and Memory: patient responsive and seems to comprehend information  Neurologic Exam: neurological assessment grossly intact    Cardiovascular Studies      Cardiovascular Health Factors  Vitals BP Readings from Last 3 Encounters:   08/28/22 132/69   06/14/22 105/71   05/30/22 136/72     Wt Readings from Last 3 Encounters:   08/28/22 74.3 kg (163 lb 12.8 oz)   06/14/22 76.2 kg (168 lb)   05/28/22 74.5 kg (164 lb 3.9 oz)     BMI Readings from Last 3 Encounters:   08/28/22 23.50 kg/m?   06/14/22 24.11 kg/m?   05/28/22 23.57 kg/m?      Smoking Social History     Tobacco Use   Smoking Status Former    Current packs/day: 0.00    Average packs/day: 1.5 packs/day for 60.0 years (90.0 ttl pk-yrs)    Types: Cigarettes    Start date: 02/13/1958    Quit date: 02/13/2018    Years since quitting: 4.5   Smokeless Tobacco Never   Tobacco Comments    0.5-2 ppd history      Lipid Profile Cholesterol   Date Value Ref Range Status   04/04/2022 120  Final     HDL   Date Value Ref Range Status   04/04/2022 29 (L) >=40 Final     LDL   Date Value Ref Range Status   04/04/2022 70  Final     Triglycerides   Date Value Ref Range Status   04/04/2022 107  Final      Blood Sugar Hemoglobin A1C   Date Value Ref Range Status   05/28/2022 5.5 4.0 - 5.7 % Final     Comment:     The ADA recommends that most patients with type 1 and type 2 diabetes maintain   an A1c level <7%.       Glucose   Date Value Ref Range Status   05/30/2022 85 70 - 100 MG/DL Final   28/41/3244 94 70 - 100 MG/DL Final   01/24/7251 90 70 - 100 MG/DL Final     Glucose, POC   Date Value Ref Range Status   05/28/2022 100 70 - 100 MG/DL Final   66/44/0347 87 70 - 100 MG/DL Final   42/59/5638 756 (H) 70 - 100 MG/DL Final          Problems Addressed Today  Encounter Diagnoses   Name Primary?    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  Hospital discharge follow-up at [facility] between 5/6 - 05/30/2022  Patient was admitted with symptoms of acute hypoxic respiratory failure, he was treated with IV antibiotics, given IV Lasix  During this admission patient was found to be in normal sinus rhythm and a TEE guided cardioversion initially scheduled for 05/30/2022 was canceled  2.  Episodes of paroxysmal tachycardia  Very likely due to paroxysmal atrial fibrillation  Patient is currently on carvedilol and anticoagulated with apixaban 2.5 mg p.o. twice daily  3. History of CVA-this occurred in July 2020  4.  History of severe left carotid artery stenosis  5.  Status post left CEA  Intraoperatively patient was found to have a stenosis distal to the operated area, he also underwent stent placement-he recovered well following the CVA  6.  History of severe aortic valve stenosis  7.  Status post TAVR, patient received #29 SAPIEN S3 valve in March 2021-normal function, last MG = 4 mmHg on an echocardiogram performed in 05/23/2022  8.  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  Nonobstructive coronary artery disease --no symptoms of angina  9.  Occasional orthostatic hypotension- patient was previously on carvedilol, at the last office visit on 06/11/2022 he was switched to metoprolol XL and he has been able to tolerate this very well  11.  Hyperlipidemia-on atorvastatin  12.  Abnormal EKG, bifascicular block-RBBB, LAD, LAFB  13.  Overall general weakness, debility due to multiple comorbidities and physical deconditioning --improved after undergoing physical therapy  14.  HFrEF-LVEF 45% by the last echocardiogram dated 05/23/2022        Plan:    1.  Continue current medications  2.  I recommend vitamin D3 level to be checked and if not within range patient should undergo daily vitamin D supplementation  3.  Follow-up office visit in 6 to 8 months, we will repeat an echocardiogram next year.    Total Time Today was 40 minutes in the following activities: Preparing to see the patient, Obtaining and/or reviewing separately obtained history, Performing a medically appropriate examination and/or evaluation, Counseling and educating the patient/family/caregiver, Ordering medications, tests, or procedures, Referring and communication with other health care professionals (when not separately reported), Documenting clinical information in the electronic or other health record, Independently interpreting results (not separately reported) and communicating results to the patient/family/caregiver, and Care coordination (not separately reported)          Current Medications (including today's revisions)   albuterol (VENTOLIN HFA) 90 mcg/actuation inhaler Inhale two puffs by mouth into the lungs every 6 hours as needed for Wheezing or Shortness of Breath. Shake well before use.  albuterol 0.083% (PROVENTIL) 2.5 mg /3 mL (0.083 %) nebulizer solution Inhale 3 mL solution by nebulizer as directed twice daily as needed.    apixaban (ELIQUIS) 2.5 mg tablet Take one tablet by mouth twice daily.    atorvastatin (LIPITOR) 20 mg tablet Take one tablet by mouth daily.    budesonide (PULMICORT) 0.5 mg/2 mL nebulizer solution Inhale 2 mL solution by nebulizer as directed twice daily.    clopiDOGrel (PLAVIX) 75 mg tablet Take one tablet by mouth daily.    dutasteride (AVODART) 0.5 mg PO capsule Take one capsule by mouth daily.    food supplemt, lactose-reduced (ENSURE PLUS) 0.05 gram- 1.5 kcal/mL oral liquid Take 1 Can by mouth twice daily with meals.    furosemide (LASIX) 20 mg tablet Take one tablet by mouth daily as needed.    levothyroxine (SYNTHROID) 88 mcg tablet Take one tablet by mouth daily 30 minutes before breakfast.    metoprolol succinate XL (TOPROL XL) 25 mg extended release tablet Take one tablet by mouth twice daily. Indications: paroxysmal supraventricular tachycardia    montelukast (SINGULAIR) 10 mg tablet Take one tablet by mouth at bedtime daily. (Patient taking differently: Take one tablet by mouth daily.)    MYRBETRIQ 25 mg tablet Take one tablet by mouth daily.    pantoprazole DR (PROTONIX) 40 mg tablet Take one tablet by mouth daily.

## 2022-10-11 ENCOUNTER — Encounter: Admit: 2022-10-11 | Discharge: 2022-10-11 | Payer: MEDICARE

## 2022-10-11 MED ORDER — METOPROLOL SUCCINATE 25 MG PO TB24
25 mg | ORAL_TABLET | Freq: Two times a day (BID) | ORAL | 3 refills
Start: 2022-10-11 — End: ?

## 2022-11-19 ENCOUNTER — Encounter: Admit: 2022-11-19 | Discharge: 2022-11-19 | Payer: MEDICARE

## 2022-11-19 MED ORDER — ELIQUIS 2.5 MG PO TAB
2.5 mg | ORAL_TABLET | Freq: Two times a day (BID) | ORAL | 3 refills | Status: AC
Start: 2022-11-19 — End: ?

## 2022-11-24 ENCOUNTER — Emergency Department: Admit: 2022-11-24 | Discharge: 2022-11-24 | Payer: MEDICARE

## 2022-11-24 ENCOUNTER — Encounter: Admit: 2022-11-24 | Discharge: 2022-11-24 | Payer: MEDICARE

## 2022-11-24 ENCOUNTER — Ambulatory Visit: Admit: 2022-11-24 | Discharge: 2022-11-24 | Payer: MEDICARE

## 2022-11-24 DIAGNOSIS — J9601 Acute respiratory failure with hypoxia: Secondary | ICD-10-CM

## 2022-11-24 DIAGNOSIS — U071 Pneumonia due to COVID-19 virus: Secondary | ICD-10-CM

## 2022-11-24 LAB — COMPREHENSIVE METABOLIC PANEL
ALBUMIN: 3.2 g/dL — ABNORMAL LOW (ref 3.5–5.0)
ALK PHOSPHATASE: 48 U/L — ABNORMAL LOW (ref 25–110)
ALT: 7 U/L (ref 7–56)
ANION GAP: 9 10*3/uL — ABNORMAL HIGH (ref 3–12)
AST: 11 U/L — ABNORMAL HIGH (ref 7–40)
CO2: 26 MMOL/L (ref 21–30)
EGFR: 51 mL/min — ABNORMAL LOW (ref >60)
TOTAL BILIRUBIN: 0.7 mg/dL (ref 0.2–1.3)
TOTAL PROTEIN: 6.1 g/dL (ref 6.0–8.0)

## 2022-11-24 LAB — BLOOD GASES, PERIPHERAL VENOUS
BICARB, VENOUS(CAL): 26 MMOL/L — ABNORMAL HIGH (ref 8.5–10.6)
PH-VENOUS: 7.4 MMOL/L — ABNORMAL LOW (ref 7.30–7.40)

## 2022-11-24 LAB — CBC AND DIFF
ABSOLUTE BASO COUNT: 0 10*3/uL (ref 0–0.20)
ABSOLUTE EOS COUNT: 0.1 10*3/uL (ref 0–0.45)
ABSOLUTE MONO COUNT: 1.8 10*3/uL — ABNORMAL HIGH (ref 0–0.80)
HEMATOCRIT: 38 % — ABNORMAL LOW (ref 40–50)
MCH: 25 pg — ABNORMAL LOW (ref 26–34)
MCHC: 32 g/dL — ABNORMAL HIGH (ref 32.0–36.0)
MCV: 79 FL — ABNORMAL LOW (ref 80–100)
MDW (MONOCYTE DISTRIBUTION WIDTH): 24 — ABNORMAL HIGH (ref <20.7)
RBC COUNT: 4.8 M/UL (ref 4.4–5.5)
WBC COUNT: 12 10*3/uL — ABNORMAL HIGH (ref 4.5–11.0)

## 2022-11-24 LAB — HIGH SENSITIVITY TROPONIN I 2 HOUR: HIGH SENSITIVITY TROPONIN I 2 HOUR: 39 ng/L — ABNORMAL HIGH (ref <20)

## 2022-11-24 LAB — HIGH SENSITIVITY TROPONIN I 4 HR
HI SEN TNI 4 HR: 40 ng/L — ABNORMAL HIGH (ref <20)
HI SEN TNI DELTA 4-2: 1

## 2022-11-24 LAB — PHOSPHORUS: PHOSPHORUS: 2.4 mg/dL (ref 2.0–4.5)

## 2022-11-24 LAB — MAGNESIUM: MAGNESIUM: 1.6 mg/dL (ref 1.6–2.6)

## 2022-11-24 MED ORDER — ONDANSETRON HCL (PF) 4 MG/2 ML IJ SOLN
4 mg | INTRAVENOUS | 0 refills | Status: DC | PRN
Start: 2022-11-24 — End: 2022-11-27

## 2022-11-24 MED ORDER — DOXYCYCLINE 100 MG/100 ML IVPB (MB+)
100 mg | Freq: Two times a day (BID) | INTRAVENOUS | 0 refills | Status: DC
Start: 2022-11-24 — End: 2022-11-24

## 2022-11-24 MED ORDER — MIRABEGRON 25 MG PO TB24
25 mg | Freq: Every day | ORAL | 0 refills | Status: DC
Start: 2022-11-24 — End: 2022-11-27
  Administered 2022-11-25 – 2022-11-27 (×3): 25 mg via ORAL

## 2022-11-24 MED ORDER — ALBUTEROL SULFATE 2.5 MG /3 ML (0.083 %) IN NEBU
2.5 mg | RESPIRATORY_TRACT | 0 refills | Status: DC | PRN
Start: 2022-11-24 — End: 2022-11-27

## 2022-11-24 MED ORDER — DOXYCYCLINE HYCLATE 100 MG PO TAB
100 mg | Freq: Two times a day (BID) | ORAL | 0 refills | Status: CP
Start: 2022-11-24 — End: ?
  Administered 2022-11-25 – 2022-11-27 (×5): 100 mg via ORAL

## 2022-11-24 MED ORDER — DOXYCYCLINE 100 MG/100 ML IVPB (MB+)
100 mg | Freq: Once | INTRAVENOUS | 0 refills | Status: CP
Start: 2022-11-24 — End: ?
  Administered 2022-11-24 (×2): 100 mg via INTRAVENOUS

## 2022-11-24 MED ORDER — ONDANSETRON 4 MG PO TBDI
4 mg | ORAL | 0 refills | Status: DC | PRN
Start: 2022-11-24 — End: 2022-11-27

## 2022-11-24 MED ORDER — MONTELUKAST 10 MG PO TAB
10 mg | Freq: Every day | ORAL | 0 refills | Status: DC
Start: 2022-11-24 — End: 2022-11-27
  Administered 2022-11-26 – 2022-11-27 (×2): 10 mg via ORAL

## 2022-11-24 MED ORDER — MELATONIN 5 MG PO TAB
5 mg | Freq: Every evening | ORAL | 0 refills | Status: DC | PRN
Start: 2022-11-24 — End: 2022-11-27

## 2022-11-24 MED ORDER — BUDESONIDE 0.5 MG/2 ML IN NBSP
.5 mg | Freq: Two times a day (BID) | RESPIRATORY_TRACT | 0 refills | Status: DC
Start: 2022-11-24 — End: 2022-11-27
  Administered 2022-11-25 – 2022-11-27 (×6): 0.5 mg via RESPIRATORY_TRACT

## 2022-11-24 MED ORDER — METOPROLOL SUCCINATE 25 MG PO TB24
25 mg | Freq: Two times a day (BID) | ORAL | 0 refills | Status: DC
Start: 2022-11-24 — End: 2022-11-27
  Administered 2022-11-25 – 2022-11-27 (×6): 25 mg via ORAL

## 2022-11-24 MED ORDER — APIXABAN 2.5 MG PO TAB
2.5 mg | Freq: Two times a day (BID) | ORAL | 0 refills | Status: DC
Start: 2022-11-24 — End: 2022-11-27
  Administered 2022-11-25 – 2022-11-27 (×6): 2.5 mg via ORAL

## 2022-11-24 MED ORDER — REMDESIVIR IVPB (LOADING DOSE)
200 mg | Freq: Once | INTRAVENOUS | 0 refills | Status: CP
Start: 2022-11-24 — End: ?
  Administered 2022-11-24 (×2): 200 mg via INTRAVENOUS

## 2022-11-24 MED ORDER — REMDESIVIR 100MG IVPB (MB+)
100 mg | INTRAVENOUS | 0 refills | Status: DC
Start: 2022-11-24 — End: 2022-11-27
  Administered 2022-11-26 (×4): 100 mg via INTRAVENOUS

## 2022-11-24 MED ORDER — SENNOSIDES-DOCUSATE SODIUM 8.6-50 MG PO TAB
1 | Freq: Every day | ORAL | 0 refills | Status: DC | PRN
Start: 2022-11-24 — End: 2022-11-27

## 2022-11-24 MED ORDER — LEVOTHYROXINE 88 MCG PO TAB
88 ug | Freq: Every day | ORAL | 0 refills | Status: DC
Start: 2022-11-24 — End: 2022-11-27
  Administered 2022-11-25 – 2022-11-27 (×3): 88 ug via ORAL

## 2022-11-24 MED ORDER — ATORVASTATIN 20 MG PO TAB
20 mg | Freq: Every day | ORAL | 0 refills | Status: DC
Start: 2022-11-24 — End: 2022-11-27
  Administered 2022-11-25 – 2022-11-27 (×3): 20 mg via ORAL

## 2022-11-24 MED ORDER — CLOPIDOGREL 75 MG PO TAB
75 mg | Freq: Every day | ORAL | 0 refills | Status: DC
Start: 2022-11-24 — End: 2022-11-27
  Administered 2022-11-25 – 2022-11-27 (×3): 75 mg via ORAL

## 2022-11-24 MED ORDER — HYDRALAZINE 50 MG PO TAB
25 mg | ORAL | 0 refills | Status: DC | PRN
Start: 2022-11-24 — End: 2022-11-27

## 2022-11-24 MED ORDER — PANTOPRAZOLE 40 MG PO TBEC
40 mg | Freq: Every day | ORAL | 0 refills | Status: DC
Start: 2022-11-24 — End: 2022-11-27
  Administered 2022-11-25 – 2022-11-27 (×3): 40 mg via ORAL

## 2022-11-24 MED ORDER — POLYETHYLENE GLYCOL 3350 17 GRAM PO PWPK
1 | Freq: Every day | ORAL | 0 refills | Status: DC | PRN
Start: 2022-11-24 — End: 2022-11-27

## 2022-11-24 MED ORDER — DUTASTERIDE 0.5 MG PO CAP
.5 mg | Freq: Every day | ORAL | 0 refills | Status: DC
Start: 2022-11-24 — End: 2022-11-27
  Administered 2022-11-25 – 2022-11-27 (×3): 0.5 mg via ORAL

## 2022-11-24 NOTE — Progress Notes
VUC Visit Note    Date of Service: 11/24/2022    Subjective:           Javier Gutierrez is a 82 y.o. male.    HPI    Patient and daughter present with concerns of ongoing cough, congestion and sneezing x 5 days. He tested positive for COVID yesterday 11/23/22. PRIMARY CARE PROVIDER has been monitoring him and started him on a medrol dose pack and doxycyline due to high risk status. Daughter concerned because oxygen saturation has decreased from 98% to 94%, denies fevers or shortness of breath but was inquiring about paxlovid. Due to limited capabilities through telehealth is it my recommendation that you go to the ER at this time for further evaluation and management.        Review of Systems   Constitutional:  Positive for chills and fatigue. Negative for activity change, appetite change and fever.   HENT:  Positive for congestion, rhinorrhea and sneezing.    Respiratory:  Positive for cough. Negative for chest tightness, shortness of breath and wheezing.    Cardiovascular:  Negative for chest pain and palpitations.   Gastrointestinal:  Negative for abdominal pain, diarrhea, nausea and vomiting.   Musculoskeletal:  Negative for myalgias.       .  Objective:          albuterol (VENTOLIN HFA) 90 mcg/actuation inhaler Inhale two puffs by mouth into the lungs every 6 hours as needed for Wheezing or Shortness of Breath. Shake well before use.    albuterol 0.083% (PROVENTIL) 2.5 mg /3 mL (0.083 %) nebulizer solution Inhale 3 mL solution by nebulizer as directed twice daily as needed.    apixaban (ELIQUIS) 2.5 mg tablet TAKE 1 TABLET BY MOUTH TWO TIMES A DAY    atorvastatin (LIPITOR) 20 mg tablet Take one tablet by mouth daily.    budesonide (PULMICORT) 0.5 mg/2 mL nebulizer solution Inhale 2 mL solution by nebulizer as directed twice daily.    clopiDOGrel (PLAVIX) 75 mg tablet Take one tablet by mouth daily.    dutasteride (AVODART) 0.5 mg PO capsule Take one capsule by mouth daily.    food supplemt, lactose-reduced (ENSURE PLUS) 0.05 gram- 1.5 kcal/mL oral liquid Take 1 Can by mouth twice daily with meals.    furosemide (LASIX) 20 mg tablet Take one tablet by mouth daily as needed.    levothyroxine (SYNTHROID) 88 mcg tablet Take one tablet by mouth daily 30 minutes before breakfast.    metoprolol succinate XL (TOPROL XL) 25 mg extended release tablet TAKE 1 TABLET BY MOUTH TWO TIMES A DAY    montelukast (SINGULAIR) 10 mg tablet Take one tablet by mouth at bedtime daily. (Patient taking differently: Take one tablet by mouth daily.)    MYRBETRIQ 25 mg tablet Take one tablet by mouth daily.    pantoprazole DR (PROTONIX) 40 mg tablet Take one tablet by mouth daily.         [] Patient unable to collect vitals      Computed Telehealth Body Mass Index unavailable. One or more values for this score either were not found within the given timeframe or did not fit some other criterion.    Physical Exam  Constitutional:       General: He is not in acute distress.     Appearance: He is normal weight. He is ill-appearing. He is not toxic-appearing.   HENT:      Head: Normocephalic.   Eyes:      Pupils:  Pupils are equal, round, and reactive to light.   Pulmonary:      Effort: Pulmonary effort is normal.   Neurological:      Mental Status: He is alert. Mental status is at baseline.   Psychiatric:         Mood and Affect: Mood normal.         Behavior: Behavior normal.              Assessment and Plan:    1. COVID-19    Due to limited capabilities through telehealth is it my recommendation that you go to the ER at this time for further evaluation and management.

## 2022-11-24 NOTE — Progress Notes
 RT Adult Assessment Note    NAME:Javier Gutierrez             MRN: [id number]             DOB:1940-10-21          AGE: 82 y.o.  ADMISSION DATE: 11/24/2022             DAYS ADMITTED: LOS: 0 days    Additional Comments:  Impressions of the patient: pt resting in bed on RA no  RAD noted  Intervention(s)/outcome(s): assessed pt  Patient education that was completed: opep device   Recommendations to the care team: pt medications ordered per home reg, opep BID and IS ordered to encourage airway clearance and expansion.    Vital Signs:  Pulse: 90  RR: 18 PER MINUTE  SpO2: 94 %  O2 Device: None (Room air)  Liter Flow:    O2%:      Breath Sounds:   Right Apex Breath Sounds: Clear (Implies normal)  Right Base Breath Sounds: Decreased  Left Apex Breath Sounds: Clear (Implies normal)  Left Base Breath Sounds: Decreased  All Breath Sounds: Clear (Implies normal);Decreased  Respiratory Effort:      Comments:

## 2022-11-24 NOTE — H&P (View-Only)
 Admission History and Physical Note      Name:  Javier Gutierrez                       MRN:  [id number]                Admission Date: 11/24/2022                     Primary Care Physician: Lona Kettle      Chief Complaint:  On Triage:    Chief Complaint   Patient presents with    Covid-19    Cough     + COVID test at home on Thurs. Recommended to come to ED d/t decreasing SPO2 (93%) at home. Hx COPD.       Assessment/Plan:      Trindon Stimac is an 82 yo M with COPD, atrial tachyarrhythmia, HLD admitted for Covid PNA.        # Acute hypoxic respiratory failure  # Covid PNA  -Patient was reportedly hypoxic to 89% in the ED which improved without any intervention and is now saturating well on room air  - Pro-BNP elevated but no signs of fluid overload  - Tested positive for Covid  - CXR 11/24/2022:  Similar left basal, retrocardiac opacities with elevation of the left hemidiaphragm which may represent atelectasis, scarring or pneumonia.   - recently had a echo - no need to repeat  - will start on remdesivir in the setting of multiple comorbids  - continue nebulizer treatments  - continuous pulse oximetry     #  Leukocytosis-infectious versus steroids  - WBC 12.6 on admission  - Recently been on steroids (medrol prescribed by PCP)  - procalcitonin --returned negative  -Patient took 4 days of doxycycline  -Will complete 7-day course of doxycycline     #  HFrEF- Chronic  -05/23/2022: Echo, mildly depressed LV systolic function, EF 45%, moderate concentric LV hypertrophy, inferior wall hypokinesis, no valvular abnormalities  - no physical or radiological signs of fluid overload  - takes prn lasix at home and has not taken this in a very long time  - continue PTA toprol     #  Atrial tachyarrthymia/Atrial fibrillation  #  HTN  - follows with [facility], last seen on 08/28/22  - continue PTA eliquis  - Continue PTA toprol 25 mg twice daily  -  Tele monitoring     #  History of COPD  - Recently finished medrol pack prescribed by PCP  - Continue PTA inhalers: Albuterol, Pulmicort, Flovent     #  Hyperlipidemia  - Continue PTA statin     #  CKD stage 3  - baseline Cr 1.5-1.7      Diet: Low cholesterol diet  DVT PPx: Eliquis  Code Status: Full Code      Lennox Laity MD  Internal Medicine  [facility] 5 [phone]    Note: To reach covering physician or NP 24/7, search Voalte for Med Private *team* First Call or utilize the service team pager.      Medical Decision Making  Risk  Drug therapy requiring intensive monitoring for toxicity.  Decision regarding hospitalization.     1 acute or chronic illness or injury that poses a threat to life or bodily function     History of Present Illness:     Javier Gutierrez is a 82 y.o. male with history of COPD,  atrial tachy arrhythmia, history of CVA, carotid stenosis s/p CEA, s/p TAVR, orthostatic hypotension, HLD presenting with hypoxia at home. History obtained from patient and daughter at bedside. Daughter tested positive for covid on 10/26. Patient started having symptoms of cough and congestion on 10/31. They reached out to pt's PCP who prescribed a Medrol dosepak which he completed. Has also been taking doxycycline. This morning he woke up and pulse oximeter reported low O2 readings in the low 90s- high 80s. Patient currently denies any other symptoms.    Review of Systems:    Review of Systems   Constitutional:  Negative for chills, fever and malaise/fatigue.   HENT:  Negative for congestion, ear pain and sore throat.    Eyes:  Negative for blurred vision and photophobia.   Respiratory:  Positive for cough. Negative for wheezing.    Cardiovascular:  Negative for chest pain, palpitations and orthopnea.   Gastrointestinal:  Negative for nausea and vomiting.   Genitourinary:  Negative for dysuria and urgency.   Musculoskeletal:  Negative for falls and myalgias.   Neurological:  Negative for dizziness and headaches.   Psychiatric/Behavioral:  Negative for substance abuse.        Past Medical History:   Diagnosis Date    Acute on chronic systolic heart failure, NYHA class 2 (HCC)     Amyloidosis (HCC)     Aortic valve stenosis, mild 11/29/2008    Arrhythmia 05/23/2022    new onset afib-sched for cardioversion 05/082024    Arterial occlusion     left kidney-two stents placed    Arthritis     lower back    Asthma     CAD (coronary artery disease) 11/29/2008    Cardiomyopathy (HCC) 11/29/2008    Carotid artery plaque     CHF (congestive heart failure) (HCC)     Colon polyps     COPD (chronic obstructive pulmonary disease) (HCC)     Coronary atherosclerosis     Dizziness     Dyspnea     Frailty     H/O: CVA (cardiovascular accident) 11/29/2008    History of renal artery stenosis 11/29/2008    HTN     Hyperlipidemia     Hyperlipidemia 11/29/2008    Hypertension 11/29/2008    Hypothyroidism     Kidney disease 2017    Lung disease     Peripheral vascular disease (HCC)     Tobacco abuse     Valvular heart disease        Surgical History:   Procedure Laterality Date    HX LUMBAR DISKECTOMY  01/22/1966    30 years ago    STENT INTRAVASCULAR  01/23/2004    kidney stent placed    Left Heart Catheterization With Ventriculogram Left 03/11/2015    Performed by Marcell Barlow, MD, [facility] at [facility] CATH LAB    Coronary Angiography N/A 03/11/2015    Performed by Marcell Barlow, MD, [facility] at [facility] CATH LAB    Possible Percutaneous Coronary Intervention N/A 03/11/2015    Performed by Marcell Barlow, MD, [facility] at [facility] CATH LAB    ESOPHAGOGASTRODUODENOSCOPY N/A 04/26/2015    Performed by Tempie Hoist, DO at [facility] ENDO    ESOPHAGOGASTRODUODENOSCOPY BIOPSY  04/26/2015    Performed by Tempie Hoist, DO at [facility] ENDO    REPLACEMENT TRANSCATHETER AORTIC VALVE (Sapien 26s3), right common femoral artery approach N/A 08/03/2015    Performed by Zella Richer, MD at [facility] CVOR  BRONCHOSCOPY Bilateral 11/30/2016    Performed by Audie Box, MD at [facility] OR    BRONCHOSCOPY WITH BRONCHIAL ALVEOLAR LAVAGE - FLEXIBLE Right 11/30/2016 Performed by Audie Box, MD at [facility] OR    ESOPHAGOGASTRODUODENOSCOPY for PEG placement N/A 12/06/2016    Performed by Buckles, Vinnie Level, MD at [facility] ENDO    ENDARTERECTOMY CAROTID ARTERY Left 02/17/2018    Performed by Buck Mam, MD at [facility] OR    ANOMALOUS VENOUS RETURN REPAIR  2017    ECHOCARDIOGRAM PROCEDURE  05/2022       Family History   Problem Relation Name Age of Onset    Stroke Mother [place]     Other Mother [place]         colon cancer    Other Father          valve disease/COPD       Social History     Tobacco Use    Smoking status: Former     Current packs/day: 0.00     Average packs/day: 1.5 packs/day for 60.0 years (90.0 ttl pk-yrs)     Types: Cigarettes     Start date: 02/13/1958     Quit date: 02/13/2018     Years since quitting: 4.7    Smokeless tobacco: Never    Tobacco comments:     0.5-2 ppd history   Vaping Use    Vaping status: Never Used   Substance and Sexual Activity    Alcohol use: No    Drug use: No    Sexual activity: Not Currently     Partners: Female        All the histories listed above; including Past Medical History, Past Surgical History, Family History and Social History have been reviewed.    Immunizations (includes history and patient reported):   Immunization History   Administered Date(s) Administered    COVID-19 (MODERNA), mRNA vacc, 100 mcg/0.5 mL (PF) 02/19/2019, 03/19/2019    COVID-19 Bivalent (49YR+)(PFIZER), mRNA vacc, 40mcg/0.3mL 10/31/2020    Covid-19 mRNA Vaccine >=12yo (Moderna)(Spikevax) 11/05/2021    Flu Vaccine =>3 YO (Historical) 10/21/2016    Flu Vaccine =>65 YO High-Dose (PF) 12/12/2016    Pneumococcal Vaccine (23-Val Adult) 12/12/2016    Tdap Vaccine 06/29/2018           Allergies:  Patient has no known allergies.    Medications:  (Not in a hospital admission)      Physical Exam:  Vital Signs: Last Filed In 24 Hours Vital Signs: 24 Hour Range   BP: 163/77 (11/02 1330)  Temp: 36.6 ?C (97.8 ?F) (11/02 1028)  Pulse: 87 (11/02 1330)  Respirations: 18 PER MINUTE (11/02 1028)  SpO2: 94 % (11/02 1330)  O2 Device: None (Room air) (11/02 1035) BP: (131-163)/(59-86)   Temp:  [36.6 ?C (97.8 ?F)]   Pulse:  [87-99]   Respirations:  [18 PER MINUTE]   SpO2:  [93 %-96 %]   O2 Device: None (Room air)            Physical Exam  Constitutional:       General: He is not in acute distress.     Appearance: Normal appearance.   HENT:      Head: Normocephalic and atraumatic.      Nose: Nose normal.   Eyes:      Extraocular Movements: Extraocular movements intact.      Pupils: Pupils are equal, round, and reactive to light.   Cardiovascular:  Rate and Rhythm: Normal rate and regular rhythm.   Pulmonary:      Effort: Pulmonary effort is normal.      Breath sounds: Normal breath sounds. No wheezing or rales.   Abdominal:      General: Abdomen is flat. There is no distension.      Palpations: Abdomen is soft.      Tenderness: There is no abdominal tenderness.   Musculoskeletal:         General: Normal range of motion.      Cervical back: Normal range of motion.   Neurological:      General: No focal deficit present.      Mental Status: He is alert and oriented to person, place, and time.           Lab/Radiology/Other Diagnostic Tests:  Results for orders placed or performed during the hospital encounter of 11/24/22 (from the past 24 hour(s))   CBC AND DIFF    Collection Time: 11/24/22 11:32 AM   Result Value Ref Range    White Blood Cells 12.6 (H) 4.5 - 11.0 K/UL    RBC 4.86 4.4 - 5.5 M/UL    Hemoglobin 12.6 (L) 13.5 - 16.5 GM/DL    Hematocrit 45.4 (L) 40 - 50 %    MCV 79.1 (L) 80 - 100 FL    MCH 25.8 (L) 26 - 34 PG    MCHC 32.7 32.0 - 36.0 G/DL    RDW 09.8 (H) 11 - 15 %    Platelet Count 217 150 - 400 K/UL    MPV 8.7 7 - 11 FL    Neutrophils 80 (H) 41 - 77 %    Lymphocytes 4 (L) 24 - 44 %    Monocytes 15 (H) 4 - 12 %    Eosinophils 1 0 - 5 %    Basophils 0 0 - 2 %    Absolute Neutrophil Count 10.07 (H) 1.8 - 7.0 K/UL    Absolute Lymph Count 0.53 (L) 1.0 - 4.8 K/UL    Absolute Monocyte Count 1.87 (H) 0 - 0.80 K/UL    Absolute Eosinophil Count 0.12 0 - 0.45 K/UL    Absolute Basophil Count 0.04 0 - 0.20 K/UL    MDW (Monocyte Distribution Width) 24.0 (H) <20.7   COMPREHENSIVE METABOLIC PANEL    Collection Time: 11/24/22 11:32 AM   Result Value Ref Range    Sodium 138 137 - 147 MMOL/L    Potassium 3.8 3.5 - 5.1 MMOL/L    Chloride 103 98 - 110 MMOL/L    Glucose 108 (H) 70 - 100 MG/DL    Blood Urea Nitrogen 35 (H) 7 - 25 MG/DL    Creatinine 1.19 (H) 0.4 - 1.24 MG/DL    Calcium 8.6 8.5 - 14.7 MG/DL    Total Protein 6.1 6.0 - 8.0 G/DL    Total Bilirubin 0.7 0.2 - 1.3 MG/DL    Albumin 3.2 (L) 3.5 - 5.0 G/DL    Alk Phosphatase 48 25 - 110 U/L    AST (SGOT) 11 7 - 40 U/L    CO2 26 21 - 30 MMOL/L    ALT (SGPT) 7 7 - 56 U/L    Anion Gap 9 3 - 12    eGFR 51 (L) >60 mL/min   BLOOD GASES, PERIPHERAL VENOUS    Collection Time: 11/24/22 11:32 AM   Result Value Ref Range    pH-Venous 7.40 7.30 - 7.40    PCO2-Venous 48 36 -  50 MMHG    PO2-Venous 33 33 - 48 MMHG    Base Excess-Venous 3.8 MMOL/L    O2 Sat-Venous 55.1 55 - 71 %    Bicarbonate-VEN-Cal 26.9 MMOL/L   COVID-19 (SARS-COV-2) PCR    Collection Time: 11/24/22 11:32 AM    Specimen: Nasopharyngeal; Flocked Swab   Result Value Ref Range    COVID-19 (SARS-CoV-2) PCR Source FLOCKED SWAB  NASOPHARYNGEAL       COVID-19 (SARS-CoV-2) PCR DETECTED (A) DN-NOT DETECTED   INFLUENZA A/B AND RSV PCR    Collection Time: 11/24/22 11:32 AM    Specimen: Nasopharyngeal; Flocked Swab   Result Value Ref Range    Influenza A Virus NEG NEG-NEG    Influenza B Virus NEG NEG-NEG    RSV NEG NEG-NEG   PROCALCITONIN    Collection Time: 11/24/22 11:32 AM   Result Value Ref Range    Procalcitonin 0.09 ng/mL   HIGH SENSITIVITY TROPONIN I 0 HOUR    Collection Time: 11/24/22 11:32 AM   Result Value Ref Range    hs Troponin I 0 Hour 39 (H) <20 ng/L   NT-PRO-BNP    Collection Time: 11/24/22 11:32 AM   Result Value Ref Range    NT-Pro-BNP 3,011.0 (H) <450 pg/mL         CHEST SINGLE VIEW Final Result      Similar left basal, retrocardiac opacities with elevation of the left hemidiaphragm which may represent atelectasis, scarring or pneumonia. Follow-up PA and lateral chest radiographs suggested in 6-8 weeks to ensure stability or improvement.          Finalized by Delmar Landau, M.D. on 11/24/2022 12:20 PM. Dictated by Delmar Landau, M.D. on 11/24/2022 12:17 PM.      2D + DOPPLER ECHO    (Results Pending)

## 2022-11-24 NOTE — ED Notes
 Pt to [facility] with c/o cough. Family reports that symptoms started on Wednesday or cough and SOB. Daughter reports positive home covid test. Daughter stating that oxygen was low at home. Pt is currently in NAD, A&OX4, VSS.    Past Medical History:   Diagnosis Date    Acute on chronic systolic heart failure, NYHA class 2 (HCC)     Amyloidosis (HCC)     Aortic valve stenosis, mild 11/29/2008    Arrhythmia 05/23/2022    new onset afib-sched for cardioversion 05/082024    Arterial occlusion     left kidney-two stents placed    Arthritis     lower back    Asthma     CAD (coronary artery disease) 11/29/2008    Cardiomyopathy (HCC) 11/29/2008    Carotid artery plaque     CHF (congestive heart failure) (HCC)     Colon polyps     COPD (chronic obstructive pulmonary disease) (HCC)     Coronary atherosclerosis     Dizziness     Dyspnea     Frailty     H/O: CVA (cardiovascular accident) 11/29/2008    History of renal artery stenosis 11/29/2008    HTN     Hyperlipidemia     Hyperlipidemia 11/29/2008    Hypertension 11/29/2008    Hypothyroidism     Kidney disease 2017    Lung disease     Peripheral vascular disease (HCC)     Tobacco abuse     Valvular heart disease

## 2022-11-25 LAB — COMPREHENSIVE METABOLIC PANEL
ALBUMIN: 3.2 g/dL — ABNORMAL LOW (ref 3.5–5.0)
ALK PHOSPHATASE: 52 U/L — ABNORMAL HIGH (ref 25–110)
ANION GAP: 8 10*3/uL — ABNORMAL HIGH (ref >60)
BLD UREA NITROGEN: 30 mg/dL — ABNORMAL HIGH (ref >40)
CALCIUM: 8.3 mg/dL — ABNORMAL LOW (ref 8.5–10.6)
CREATININE: 1.3 mg/dL — ABNORMAL HIGH (ref <100)
EGFR: 54 mL/min — ABNORMAL LOW (ref >60)
TOTAL BILIRUBIN: 0.6 mg/dL — ABNORMAL LOW (ref >60)
TOTAL PROTEIN: 5.8 g/dL — ABNORMAL LOW (ref 6.0–8.0)

## 2022-11-25 LAB — ECG 12-LEAD
P AXIS: 55 degrees
P-R INTERVAL: 166 ms
Q-T INTERVAL: 408 ms
QRS DURATION: 136 ms
QTC CALCULATION (BAZETT): 501 ms
R AXIS: -68 degrees
T AXIS: 49 degrees
VENTRICULAR RATE: 91 {beats}/min

## 2022-11-25 LAB — CBC AND DIFF
ABSOLUTE BASO COUNT: 0 10*3/uL — ABNORMAL HIGH (ref 0–0.20)
ABSOLUTE LYMPH COUNT: 0.9 10*3/uL — ABNORMAL LOW (ref 1.0–4.8)
ABSOLUTE NEUTROPHIL: 9.9 10*3/uL — ABNORMAL HIGH (ref 1.8–7.0)
BASOPHILS %: 0 % — ABNORMAL HIGH (ref >60)

## 2022-11-25 NOTE — Progress Notes
 General Progress Note    Name:  Javier Gutierrez   ZOXWR'U Date:  11/25/2022  Admission Date: 11/24/2022  LOS: 1 day                     Assessment/Plan:    Principal Problem:    Pneumonia due to COVID-19 virus    Javier Fry.  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, nonobstructive CAD, chronic systolic cardiomyopathy, paroxysmal atrial fibrillation, peripheral vascular disease, history of aspiration pneumonia, orthostatic hypotension and chronic generalized debility presented to the ED with productive cough and congestion, low oxygen needs at home    COVID-19 infection and left lower lobe pneumonia possible postviral  Patient reports positive sick contacts his son-in-law was diagnosed with COVID about a week ago followed by his daughter, patient lives with her daughter, because of upper respiratory symptoms he was prescribed prophylactic antibiotics and prednisone by his PCP last week on 10/28, due to worsening symptoms and increased sputum production, along with low oxygen he was brought to the emergency room  Chest x-ray showed questionable left retrocardiac opacities, currently weaned off of oxygen  Because of his comorbidities patient was started on intravenous remdesivir dexamethasone, will continue and monitor symptoms, patient still has bronchospasms with copious sputum production, continue Mucinex, Aerobika and albuterol to facilitate mucus drainage    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%  Nonobstructive CAD  Paroxysmal atrial fibrillation  Reviewed results of last echo 05/23/2022 with a moderately reduced LVEF 45%, no reported LV diastolic parameters,  Continue Eliquis 2.5 mg twice daily, atorvastatin 40 mg daily, metoprolol XL 25 mg daily    Paroxysmal atrial fibrillation history of CVA history of CVA and left ICA stenosis with stent, continue Plavix and statin    Hypothyroidism, continue levothyroxine 88 mcg daily    BPH, continue dutasteride    Physical debility, consult PT/OT for disposition and evaluation    Diet: Dysphagia diet   DVT prophylaxis: Eliquis  Code Status: Full code    Dispo: Due to problems mentioned in my assessment and plan, patient need additional hospitalization for the management of COVID-19 infection and hypoxia, Medical Decision Making complexity is high    Discussed care plan detail with patient and his daughter who identified as home Energy manager    ________________________________________________________________________    Subjective  Javier Gutierrez is a 82 y.o. male.  Patient is a seen and examined at bedside, awake and answering appropriately, reports no chest pain or shortness of breath, during my visit he is spitting up copious amounts of thick white sputum and cough    Medications  Scheduled Meds:apixaban (ELIQUIS) tablet 2.5 mg, 2.5 mg, Oral, BID  atorvastatin (LIPITOR) tablet 20 mg, 20 mg, Oral, QDAY  budesonide (PULMICORT) nebulizer solution 0.5 mg, 0.5 mg, Inhalation, BID  clopiDOGreL (PLAVIX) tablet 75 mg, 75 mg, Oral, QDAY  doxycycline hyclate (VIBRACIN) tablet 100 mg, 100 mg, Oral, BID  dutasteride (AVODART) capsule 0.5 mg, 0.5 mg, Oral, QDAY  levothyroxine (SYNTHROID) tablet 88 mcg, 88 mcg, Oral, QDAY 30 min before breakfast  metoprolol succinate XL (TOPROL XL) tablet 25 mg, 25 mg, Oral, BID  mirabegron (MYRBETRIQ) ER tablet 25 mg, 25 mg, Oral, QDAY  montelukast (SINGULAIR) tablet 10 mg, 10 mg, Oral, QDAY  pantoprazole DR (PROTONIX) tablet 40 mg, 40 mg, Oral, QDAY  remdesivir 100 mg  in sodium chloride 0.9% (NS) 100 mL IVPB (MB+), 100 mg, Intravenous, Q24H*    Continuous Infusions:  PRN and Respiratory Meds:albuterol 0.083% Q6H PRN, hydrALAZINE Q8H PRN, melatonin QHS PRN, ondansetron Q6H PRN **OR** ondansetron (ZOFRAN) IV Q6H PRN, polyethylene glycol 3350 QDAY PRN, sennosides-docusate sodium QDAY PRN        Objective: Vital Signs: Last Filed                 Vital Signs: 24 Hour Range   BP: 136/70 (11/03 0837)  Temp: 36.7 ?C (98.1 ?F) (11/03 1610)  Pulse: 78 (11/03 0837)  Respirations: 18 PER MINUTE (11/03 0837)  SpO2: 93 % (11/03 0837)  O2 Device: None (Room air) (11/03 0837) BP: (136-179)/(70-89)   Temp:  [36.5 ?C (97.7 ?F)-37.8 ?C (100.1 ?F)]   Pulse:  [78-116]   Respirations:  [18 PER MINUTE]   SpO2:  [92 %-98 %]   O2 Device: None (Room air)     Vitals:    11/24/22 1028 11/24/22 1435   Weight: 74.8 kg (165 lb) 74.7 kg (164 lb 9.6 oz)       Intake/Output Summary:  (Last 24 hours)    Intake/Output Summary (Last 24 hours) at 11/25/2022 1218  Last data filed at 11/25/2022 0900  Gross per 24 hour   Intake 835 ml   Output --   Net 835 ml           Physical Exam    General appearance: cooperative and no distress  Respiratory: Equal air entry in both lungs, left lower lobe coarse breath sounds, occasional end expiratory wheezing mostly posttussive  Cardiovascular: regular rate and rhythm, S1, S2 normal, no murmur or gallop  GI: Abdomen: soft, non distended, non-tender, bowel sounds are present  Extremities: moves all limbs, no pedal edema  Neurologic: Alert, awake and oriented X 4, normal speech, normal strength and tone  Psych: normal mood, affect and insight     Lab Review  24-hour labs:    Results for orders placed or performed during the hospital encounter of 11/24/22 (from the past 24 hour(s))   HIGH SENSITIVITY TROPONIN I 2 HOUR    Collection Time: 11/24/22  1:31 PM   Result Value Ref Range    hs Troponin I 2 Hour 39 (H) <20 ng/L   MAGNESIUM    Collection Time: 11/24/22  1:31 PM   Result Value Ref Range    Magnesium 1.6 1.6 - 2.6 mg/dL   PHOSPHORUS    Collection Time: 11/24/22  1:31 PM   Result Value Ref Range    Phosphorus 2.4 2.0 - 4.5 MG/DL   HIGH SENSITIVITY TROPONIN I 4 HR    Collection Time: 11/24/22  3:20 PM   Result Value Ref Range    hs Troponin I 4 Hour 40 (H) <20 ng/L    hs Troponin I 2-4hr Delta Value 1    CBC AND DIFF    Collection Time: 11/25/22  6:33 AM   Result Value Ref Range    White Blood Cells 13.8 (H) 4.5 - 11.0 K/UL    RBC 4.93 4.4 - 5.5 M/UL    Hemoglobin 12.8 (L) 13.5 - 16.5 GM/DL    Hematocrit 96.0 (L) 40 - 50 %    MCV 79.3 (L) 80 - 100 FL    MCH 25.9 (L) 26 - 34 PG    MCHC 32.7 32.0 - 36.0 G/DL    RDW 45.4 (H) 11 - 15 %    Platelet Count 202  150 - 400 K/UL    MPV 8.5 7 - 11 FL    Neutrophils 72 41 - 77 %    Lymphocytes 7 (L) 24 - 44 %    Monocytes 18 (H) 4 - 12 %    Eosinophils 3 0 - 5 %    Basophils 0 0 - 2 %    Absolute Neutrophil Count 9.93 (H) 1.8 - 7.0 K/UL    Absolute Lymph Count 0.93 (L) 1.0 - 4.8 K/UL    Absolute Monocyte Count 2.45 (H) 0 - 0.80 K/UL    Absolute Eosinophil Count 0.40 0 - 0.45 K/UL    Absolute Basophil Count 0.05 0 - 0.20 K/UL   COMPREHENSIVE METABOLIC PANEL    Collection Time: 11/25/22  6:33 AM   Result Value Ref Range    Sodium 137 137 - 147 MMOL/L    Potassium 4.0 3.5 - 5.1 MMOL/L    Chloride 103 98 - 110 MMOL/L    Glucose 98 70 - 100 MG/DL    Blood Urea Nitrogen 30 (H) 7 - 25 MG/DL    Creatinine 2.95 (H) 0.4 - 1.24 MG/DL    Calcium 8.3 (L) 8.5 - 10.6 MG/DL    Total Protein 5.8 (L) 6.0 - 8.0 G/DL    Total Bilirubin 0.6 0.2 - 1.3 MG/DL    Albumin 3.2 (L) 3.5 - 5.0 G/DL    Alk Phosphatase 52 25 - 110 U/L    AST (SGOT) 13 7 - 40 U/L    CO2 26 21 - 30 MMOL/L    ALT (SGPT) 10 7 - 56 U/L    Anion Gap 8 3 - 12    eGFR 54 (L) >60 mL/min       Point of Care Testing  (Last 24 hours)  Glucose: 98 (11/25/22 6213)    Radiology and other Diagnostics Review:    No pertinent radiology.    Ladean Raya, MD   Pager: # [phone]

## 2022-11-25 NOTE — Care Plan
 Problem: Infection, Risk of  Goal: Absence of infection  Flowsheets (Taken 11/25/2022 1624)  Absence of infection:   Assess for infection (Monitor SIRS Criteria)   Monitor for signs and symptoms of infection   Administer pharmacological therapies as ordered   Implement prevention measures as indicated     Problem: High Fall Risk  Goal: High Fall Risk  Outcome: Goal Ongoing  Flowsheets (Taken 11/25/2022 1624)  High Fall Risk:   All patients will receive: High fall risk sign, yellow wristband, yellow socks, gait belt, and shower shoes   Engage chair alarm   Stay with the patient while toileting/showering   Engage bed alarm - middle setting

## 2022-11-26 LAB — CBC AND DIFF
ABSOLUTE EOS COUNT: 0.5 10*3/uL — ABNORMAL HIGH (ref >60)
ABSOLUTE LYMPH COUNT: 1.1 K/UL — ABNORMAL HIGH (ref 1.0–4.8)
ABSOLUTE MONO COUNT: 1.8 10*3/uL — ABNORMAL HIGH (ref >60)
ABSOLUTE NEUTROPHIL: 7.7 10*3/uL — ABNORMAL HIGH (ref 1.8–7.0)
BASOPHILS %: 0 % — ABNORMAL LOW (ref 0–2)
EOSINOPHILS %: 5 % — ABNORMAL LOW (ref >60)
HEMATOCRIT: 37 % — ABNORMAL LOW (ref 40–50)
HEMOGLOBIN: 12 g/dL — ABNORMAL LOW (ref 13.5–16.5)
MCH: 25 pg — ABNORMAL LOW (ref >60)
MCHC: 32 g/dL (ref 32.0–36.0)
MCV: 78 FL — ABNORMAL LOW (ref 80–100)
RDW: 17 % — ABNORMAL HIGH (ref 11–15)

## 2022-11-26 LAB — COMPREHENSIVE METABOLIC PANEL
ALBUMIN: 2.9 g/dL — ABNORMAL LOW (ref 3.5–5.0)
CALCIUM: 8 mg/dL — ABNORMAL LOW (ref 8.5–10.6)
CREATININE: 1.2 mg/dL — ABNORMAL HIGH (ref 0.4–1.24)
TOTAL BILIRUBIN: 0.5 mg/dL — ABNORMAL LOW (ref 0.2–1.3)
TOTAL PROTEIN: 5.5 g/dL — ABNORMAL LOW (ref 6.0–8.0)

## 2022-11-26 MED ORDER — DEXAMETHASONE 4 MG PO TAB
6 mg | Freq: Every day | ORAL | 0 refills | Status: DC
Start: 2022-11-26 — End: 2022-11-27
  Administered 2022-11-26 – 2022-11-27 (×2): 6 mg via ORAL

## 2022-11-26 MED ORDER — GUAIFENESIN 600 MG PO TA12
600 mg | Freq: Two times a day (BID) | ORAL | 0 refills | Status: DC
Start: 2022-11-26 — End: 2022-11-26

## 2022-11-26 MED ORDER — GUAIFENESIN 600 MG PO TA12
600 mg | Freq: Two times a day (BID) | ORAL | 0 refills | Status: DC
Start: 2022-11-26 — End: 2022-11-27
  Administered 2022-11-26 – 2022-11-27 (×3): 600 mg via ORAL

## 2022-11-26 NOTE — Progress Notes
 General Progress Note    Name:  Javier Gutierrez   WRUEA'V Date:  11/26/2022  Admission Date: 11/24/2022  LOS: 2 days                     Assessment/Plan:    Principal Problem:    Pneumonia due to COVID-19 virus    Javier Gutierrez.  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, nonobstructive CAD, chronic systolic cardiomyopathy, paroxysmal atrial fibrillation, peripheral vascular disease, history of aspiration pneumonia, orthostatic hypotension and chronic generalized debility presented to the ED with productive cough and congestion, low oxygen needs at home     COVID-19 infection and left lower lobe pneumonia possible its postviral  Patient reports positive sick contacts his son-in-law was diagnosed with COVID about a week ago followed by his daughter, patient lives with her daughter, because of upper respiratory symptoms he was prescribed prophylactic antibiotics and prednisone by his PCP last week on 10/28, due to worsening symptoms and increased sputum production, along with low oxygen he was brought to the emergency room  Chest x-ray showed questionable left retrocardiac opacities, currently weaned off of oxygen  Continue IV remdesivir for the duration of hospital stay, added dexamethasone on 11/4 because of end expiratory wheezing and sputum production  Will continue and monitor symptoms, patient still has bronchospasms with copious sputum production, continue Mucinex, Aerobika and albuterol to facilitate mucus drainage     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%  Nonobstructive CAD  Paroxysmal atrial fibrillation  Reviewed results of last echo 05/23/2022 with a moderately reduced LVEF 45%, no reported LV diastolic parameters,  Continue Eliquis 2.5 mg twice daily, atorvastatin 40 mg daily, metoprolol XL 25 mg daily     Paroxysmal atrial fibrillation history of CVA history of CVA and left ICA stenosis with stent, continue Plavix and statin     Hypothyroidism, continue levothyroxine 88 mcg daily     BPH, continue dutasteride     Physical debility, consult PT/OT for disposition and evaluation     Diet: Dysphagia diet   DVT prophylaxis: Eliquis  Code Status: Full code     Dispo: Due to problems mentioned in my assessment and plan, patient need additional hospitalization for the management of COVID-19 infection and hypoxia, time spent 50 minutes    Care plan discussed with patient and his daughter on 11/4       ________________________________________________________________________    Subjective  Javier Gutierrez is a 82 y.o. male.  Patient is seen sitting on hospital chair, per daughter patient was able to walk within the room and able to brush at the sink, still has copious upper airway secretions with a cough, no fever, chills, shortness of breath at rest or with minimal activities in the room    Medications  Scheduled Meds:apixaban (ELIQUIS) tablet 2.5 mg, 2.5 mg, Oral, BID  atorvastatin (LIPITOR) tablet 20 mg, 20 mg, Oral, QDAY  budesonide (PULMICORT) nebulizer solution 0.5 mg, 0.5 mg, Inhalation, BID  clopiDOGreL (PLAVIX) tablet 75 mg, 75 mg, Oral, QDAY  dexAMETHasone (DECADRON) tablet 6 mg, 6 mg, Oral, QDAY  doxycycline hyclate (VIBRACIN) tablet 100 mg, 100 mg, Oral, BID  dutasteride (AVODART) capsule 0.5 mg, 0.5 mg, Oral, QDAY  guaiFENesin LA (MUCINEX) tablet 600 mg, 600 mg, Oral, BID  levothyroxine (SYNTHROID) tablet 88 mcg, 88 mcg, Oral, QDAY 30 min before  breakfast  metoprolol succinate XL (TOPROL XL) tablet 25 mg, 25 mg, Oral, BID  mirabegron (MYRBETRIQ) ER tablet 25 mg, 25 mg, Oral, QDAY  montelukast (SINGULAIR) tablet 10 mg, 10 mg, Oral, QDAY  pantoprazole DR (PROTONIX) tablet 40 mg, 40 mg, Oral, QDAY  remdesivir 100 mg in sodium chloride 0.9% (NS) 100 mL IVPB (MB+), 100 mg, Intravenous, Q24H*    Continuous Infusions:  PRN and Respiratory Meds:albuterol 0.083% Q6H PRN, hydrALAZINE Q8H PRN, melatonin QHS PRN, ondansetron Q6H PRN **OR** ondansetron (ZOFRAN) IV Q6H PRN, polyethylene glycol 3350 QDAY PRN, sennosides-docusate sodium QDAY PRN        Objective:                          Vital Signs: Last Filed                 Vital Signs: 24 Hour Range   BP: 150/60 (11/04 1231)  Temp: 37 ?C (98.6 ?F) (11/04 1231)  Pulse: 80 (11/04 1231)  Respirations: 18 PER MINUTE (11/04 1231)  SpO2: 95 % (11/04 1231)  O2 Device: None (Room air) (11/04 1231)  Height: 177.8 cm (5' 10) (11/03 2009) BP: (136-178)/(60-112)   Temp:  [36.4 ?C (97.5 ?F)-37.4 ?C (99.3 ?F)]   Pulse:  [75-84]   Respirations:  [16 PER MINUTE-18 PER MINUTE]   SpO2:  [93 %-96 %]   O2 Device: None (Room air)     Vitals:    11/24/22 1028 11/24/22 1435   Weight: 74.8 kg (165 lb) 74.7 kg (164 lb 9.6 oz)       Intake/Output Summary:  (Last 24 hours)    Intake/Output Summary (Last 24 hours) at 11/26/2022 1611  Last data filed at 11/26/2022 1000  Gross per 24 hour   Intake 300 ml   Output 750 ml   Net -450 ml           Physical Exam    General appearance: cooperative and no distress  Respiratory: Equal air entry in both lungs, coarse breath sounds in both lungs with occasional end expiratory wheezing, no accessory muscle use  Cardiovascular: regular rate and rhythm, S1, S2 normal, no murmur or gallop  GI: Abdomen: soft, non distended, non-tender, bowel sounds are present  Extremities: moves all limbs, no redness or tenderness in the calves or thighs, no edema  Genitourinary: no bladder distension  Skin: dry, no rashes  Neurologic: Awake oriented x 3, hard of hearing, answering appropriately, normal speech, normal motor and tone    Lab Review  24-hour labs:    Results for orders placed or performed during the hospital encounter of 11/24/22 (from the past 24 hour(s))   CBC AND DIFF    Collection Time: 11/26/22  6:36 AM   Result Value Ref Range    White Blood Cells 11.3 (H) 4.5 - 11.0 K/UL    RBC 4.81 4.4 - 5.5 M/UL    Hemoglobin 12.3 (L) 13.5 - 16.5 GM/DL    Hematocrit 45.4 (L) 40 - 50 %    MCV 78.2 (L) 80 - 100 FL    MCH 25.5 (L) 26 - 34 PG    MCHC 32.6 32.0 - 36.0 G/DL    RDW 09.8 (H) 11 - 15 %    Platelet Count 199 150 - 400 K/UL    MPV 8.6 7 - 11 FL    Neutrophils 68 41 - 77 %    Lymphocytes 10 (L) 24 - 44 %  Monocytes 17 (H) 4 - 12 %    Eosinophils 5 0 - 5 %    Basophils 0 0 - 2 %    Absolute Neutrophil Count 7.75 (H) 1.8 - 7.0 K/UL    Absolute Lymph Count 1.16 1.0 - 4.8 K/UL    Absolute Monocyte Count 1.87 (H) 0 - 0.80 K/UL    Absolute Eosinophil Count 0.51 (H) 0 - 0.45 K/UL    Absolute Basophil Count 0.04 0 - 0.20 K/UL   COMPREHENSIVE METABOLIC PANEL    Collection Time: 11/26/22  6:36 AM   Result Value Ref Range    Sodium 138 137 - 147 MMOL/L    Potassium 3.8 3.5 - 5.1 MMOL/L    Chloride 104 98 - 110 MMOL/L    Glucose 84 70 - 100 MG/DL    Blood Urea Nitrogen 33 (H) 7 - 25 MG/DL    Creatinine 9.81 (H) 0.4 - 1.24 MG/DL    Calcium 8.0 (L) 8.5 - 10.6 MG/DL    Total Protein 5.5 (L) 6.0 - 8.0 G/DL    Total Bilirubin 0.5 0.2 - 1.3 MG/DL    Albumin 2.9 (L) 3.5 - 5.0 G/DL    Alk Phosphatase 48 25 - 110 U/L    AST (SGOT) 14 7 - 40 U/L    CO2 25 21 - 30 MMOL/L    ALT (SGPT) 10 7 - 56 U/L    Anion Gap 9 3 - 12    eGFR 57 (L) >60 mL/min       Point of Care Testing  (Last 24 hours)  Glucose: 84 (11/26/22 0636)    Radiology and other Diagnostics Review:    No pertinent radiology.    Ladean Raya, MD   Pager: # [phone]

## 2022-11-26 NOTE — Progress Notes
Family at bedside to assist with updating patient profile.

## 2022-11-26 NOTE — Progress Notes
 Pt had 20 beats of vtach md is aware

## 2022-11-26 NOTE — Progress Notes
 PHYSICAL THERAPY  ASSESSMENT      Name: Taz Waggle   MRN: [id number]     DOB: [birth year]      Age: 82 y.o.  Admission Date: 11/24/2022     LOS: 2 days     Date of Service: 11/26/2022      Mobility  Patient Turn/Position: Supine  Progressive Mobility Level: Walk in hallway  Distance Walked (feet): 90 ft  Level of Assistance: Assist X1  Assistive Device: Walker  Activity Limited By: Weakness;Fatigue    Subjective  Reason for Admission and Past Medical Hx: Shakel Casias.  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, nonobstructive CAD, chronic systolic cardiomyopathy, paroxysmal atrial fibrillation, peripheral vascular disease, history of aspiration pneumonia, orthostatic hypotension and chronic generalized debility presented to the ED with productive cough and congestion, low oxygen needs at home  Mental / Cognitive: Alert;Oriented;Cooperative  Pain: No complaint of pain  Pain Interventions: Patient agrees to participate in therapy with current pain level  Persons Present: Daughter    Home Living Situation  Lives With: Family  Type of Home: House  Entry Stairs: No stairs  In-Home Stairs: Able to live on main level  Patient Owned Equipment: Walker with wheels    Prior Level of Function  Level Of Independence: Independent with ADL and household mobility without device;Independent with ADL and community mobility with device  History of Falls in Past 3 Months: No  Occupation/Education: Retired    ROM  ROM Position Assessed: Seated  R LE ROM: WFL  R LE ROM Method: Active  L LE ROM: WFL  L LE ROM Method: Active    Strength  Strength Position Assessed: Seated  Overall Strength: Able to move all joints independently through available ROM    Posture/Neurological  Head Control: Independent  Posture: Rounded shoulders  Overall Tone: Normal;RLE;LLE    Bed Mobility/Transfer  Bed Mobility: Supine to Sit: Minimal Assist;Head of Bed Elevated;Verbal Cues;Assist with Trunk  Bed Mobility: Sit to Supine: Minimal Assist;Bed Flat;Verbal Cues;Assist with B LE  Transfer Type: Sit to Stand  Transfer: Assistance Level: From;Bed;Minimal Assist  Transfer: Assistive Device: Nurse, adult  Transfers: Type Of Assistance: For Safety Considerations;For Balance;For Strength Deficit;Verbal Cues  Other Transfer Type: Sit to/from Stand  Other Transfer: Assistance Level: To/From;Bed;Minimal Assist  Other Transfer: Assistive Device: Nurse, adult  Other Transfer: Type Of Assistance: For Safety Considerations;For Strength Deficit;For Balance;Verbal Cues  End Of Activity Status: In Bed;Nursing Notified;Instructed Patient to Request Assist with Mobility;Instructed Patient to Use Call Light    Gait  Gait Distance: 90 feet  Gait: Assistance Level: Minimal Assist  Gait: Assistive Device: Roller Walker  Gait: Descriptors: Forward trunk flexion;Variable step length;Pace: Normal  Activity Limited By: Weakness;Complaint of Fatigue  Comments: Post-gait O2 saturation 95% on room air with heart rate of 88 beats/minute. Patient denies dizziness throughout visit.    Education  Persons Educated: Patient/Family  Patient Barriers To Learning: Confusion  Interventions: Repetition of Instructions;Family Education;Demonstration Provided  Teaching Methods: Verbal Instruction;Demonstration  Patient Response: Verbalized Understanding;Return Demonstration;More Instruction Required  Topics: Plan/Goals of PT Interventions;Use of Assistive Device/Orthosis;Mobility Progression;Safety Awareness;Up with Assist Only;Importance of Increasing Activity;Recommend Continued Therapy    Assessment/Progress  Impaired Mobility Due To: Decreased Strength;Impaired Balance;Decreased Activity Tolerance;Safety Concerns  Assessment/Progress: Should Improve w/ Continued PT    AM-PAC 6 Clicks Basic Mobility Inpatient  Turning from your  back to your side while in a flat bed without using bed rails: A Little  Moving from lying on your back to sitting on the side of a flat bed without using bedrails : A Little  Moving to and from a bed to a chair (including a wheelchair): A Little  Standing up from a chair using your arms (e.g. wheelchair, or bedside chair): A Little  To walk in hospital room: A Little  Climbing 3-5 steps with a railing: A Lot  Basic Mobility Inpatient Raw Score: 17  Standardized (T-scale) Score: 39.67    Goals  Goal Formulation: With Patient/Family  Time For Goal Achievement: 3 days, To, 7 days  Patient Will Go Supine To/From Sit: w/ Stand By Assist  Patient Will Logroll: w/ Stand By Assist  Patient Will Transfer Bed/Chair: w/ Stand By Assist  Patient Will Transfer Sit to Stand: w/ Stand By Assist  Patient Will Ambulate: 101-150 Feet, w/ Dan Humphreys, w/ Verbal Cues Required/Provided    Plan  Treatment Interventions: Mobility training;Strengthening  Plan Frequency: 3-5 Days per Week  PT Plan for Next Visit: Progress gait distance/quality with ongoing use of the roller walker. Review standing LE exercise program and advance as appropriate.      PT Discharge Recommendations  Recommendation: Home with intermittent supervision/assistance  Recommendation for Therapy Post Discharge: Home health  Patient Currently Requires Equipment: Owns what is needed    Therapist  Edward Jolly, PT, DPT [phone]  Date  11/26/2022

## 2022-11-27 ENCOUNTER — Inpatient Hospital Stay
Admit: 2022-11-24 | Discharge: 2022-11-27 | Disposition: A | Discharge status: Home / Self Care | Payer: MEDICARE | Admitting: Student in an Organized Health Care Education/Training Program

## 2022-11-27 ENCOUNTER — Encounter: Admit: 2022-11-27 | Discharge: 2022-11-27 | Payer: MEDICARE

## 2022-11-27 DIAGNOSIS — I452 Bifascicular block: Secondary | ICD-10-CM

## 2022-11-27 DIAGNOSIS — N183 Chronic kidney disease, stage 3 unspecified (CMS-HCC): Secondary | ICD-10-CM

## 2022-11-27 DIAGNOSIS — I350 Nonrheumatic aortic (valve) stenosis: Secondary | ICD-10-CM

## 2022-11-27 DIAGNOSIS — I2510 Atherosclerotic heart disease of native coronary artery without angina pectoris: Secondary | ICD-10-CM

## 2022-11-27 DIAGNOSIS — Z952 Presence of prosthetic heart valve: Secondary | ICD-10-CM

## 2022-11-27 DIAGNOSIS — Z7902 Long term (current) use of antithrombotics/antiplatelets: Secondary | ICD-10-CM

## 2022-11-27 DIAGNOSIS — I428 Other cardiomyopathies: Secondary | ICD-10-CM

## 2022-11-27 DIAGNOSIS — Z79899 Other long term (current) drug therapy: Secondary | ICD-10-CM

## 2022-11-27 DIAGNOSIS — Z800 Family history of malignant neoplasm of digestive organs: Secondary | ICD-10-CM

## 2022-11-27 DIAGNOSIS — I130 Hypertensive heart and chronic kidney disease with heart failure and stage 1 through stage 4 chronic kidney disease, or unspecified chronic kidney disease: Secondary | ICD-10-CM

## 2022-11-27 DIAGNOSIS — Z860100 Personal history of colon polyps, unspecified: Secondary | ICD-10-CM

## 2022-11-27 DIAGNOSIS — E785 Hyperlipidemia, unspecified: Secondary | ICD-10-CM

## 2022-11-27 DIAGNOSIS — J440 Chronic obstructive pulmonary disease with acute lower respiratory infection: Secondary | ICD-10-CM

## 2022-11-27 DIAGNOSIS — Z87891 Personal history of nicotine dependence: Secondary | ICD-10-CM

## 2022-11-27 DIAGNOSIS — Z7901 Long term (current) use of anticoagulants: Secondary | ICD-10-CM

## 2022-11-27 DIAGNOSIS — I739 Peripheral vascular disease, unspecified: Secondary | ICD-10-CM

## 2022-11-27 DIAGNOSIS — Z8701 Personal history of pneumonia (recurrent): Secondary | ICD-10-CM

## 2022-11-27 DIAGNOSIS — Z823 Family history of stroke: Secondary | ICD-10-CM

## 2022-11-27 DIAGNOSIS — Z79890 Hormone replacement therapy (postmenopausal): Secondary | ICD-10-CM

## 2022-11-27 DIAGNOSIS — J1282 Pneumonia due to coronavirus disease 2019: Secondary | ICD-10-CM

## 2022-11-27 DIAGNOSIS — N400 Enlarged prostate without lower urinary tract symptoms: Secondary | ICD-10-CM

## 2022-11-27 DIAGNOSIS — I5022 Chronic systolic (congestive) heart failure: Secondary | ICD-10-CM

## 2022-11-27 DIAGNOSIS — Z8673 Personal history of transient ischemic attack (TIA), and cerebral infarction without residual deficits: Secondary | ICD-10-CM

## 2022-11-27 DIAGNOSIS — Z825 Family history of asthma and other chronic lower respiratory diseases: Secondary | ICD-10-CM

## 2022-11-27 DIAGNOSIS — E039 Hypothyroidism, unspecified: Secondary | ICD-10-CM

## 2022-11-27 DIAGNOSIS — I480 Paroxysmal atrial fibrillation: Secondary | ICD-10-CM

## 2022-11-27 LAB — COMPREHENSIVE METABOLIC PANEL: POTASSIUM: 4.1 MMOL/L — ABNORMAL LOW (ref 3.5–5.1)

## 2022-11-27 LAB — CBC AND DIFF
LYMPHOCYTES %: 6 % — ABNORMAL LOW (ref >60)
MCH: 25 pg — ABNORMAL LOW (ref 26–34)
MCHC: 32 g/dL — ABNORMAL HIGH (ref 32.0–36.0)
MCV: 78 FL — ABNORMAL LOW (ref 80–100)
MPV: 9.1 FL — ABNORMAL LOW (ref >60)
NEUTROPHILS %: 90 % — ABNORMAL HIGH (ref >60)
PLATELET COUNT: 210 10*3/uL — ABNORMAL LOW (ref 150–400)
RDW: 16 % — ABNORMAL HIGH (ref >60)

## 2022-11-27 MED ORDER — BENZONATATE 200 MG PO CAP
200 mg | ORAL_CAPSULE | ORAL | 0 refills | 9.00000 days | Status: DC
Start: 2022-11-27 — End: 2023-05-12

## 2022-11-27 MED ORDER — FUROSEMIDE 20 MG PO TAB
20 mg | Freq: Every day | ORAL | 0 refills | Status: DC
Start: 2022-11-27 — End: 2022-11-27
  Administered 2022-11-27: 17:00:00 20 mg via ORAL

## 2022-11-27 MED ORDER — GUAIFENESIN 600 MG PO TA12
600 mg | ORAL_TABLET | Freq: Two times a day (BID) | ORAL | 0 refills | 14.00000 days | Status: DC
Start: 2022-11-27 — End: 2023-05-12

## 2022-11-27 NOTE — Case Management (ED)
 Case Management Progress Note    NAME:Javier Gutierrez                          MRN: [id number]              DOB:[birth year]          AGE: 82 y.o.  ADMISSION DATE: 11/24/2022             DAYS ADMITTED: LOS: 3 days      Today's Date: 11/27/2022    PLAN: Patient plans to discharge to pts dgtrs home when medically stable      Expected Discharge Date: 11/27/2022   Is Patient Medically Stable: Yes   Are there Barriers to Discharge? no    INTERVENTION/DISPOSITION:  Discharge Planning                   NCM will attended huddle.  NCM reviewed EMR, labs, MAR and Notes.  NCm spoke to pt/family about DCP  NCM spoke to pts dgtr Javier Gutierrez wanted [facility] referral sent to [facility] [phone]/ fax [phone]   Pts has been on service before  pt is stay with his daughterWynona Gutierrez at [address], [place] [address]   [facility] updated  Pt wants Geriatric PCP here at [place]  Ncm will notify attending  No ongoing therapy noted.  No DME requested.  No other CM needs identified at this time  NCM will continue to assess and monitor for changes    Transportation                 Support                 Info or Referral                 Positive SDOH Domains and Potential Barriers                   Medication Needs                                                                                                                                                         Financial                 Legal                 Other                 Discharge Disposition  Selected Continued Care - Admitted Since 11/24/2022    No services have been selected for the patient.           Zollie Beckers, RN

## 2022-11-27 NOTE — Discharge Instructions - Pharmacy
 Discharge Summary      Name: Javier Gutierrez  Medical Record Number: [id number]        Account Number:  [id number]  Date Of Birth:  September 17, 1940                         Age:  82 y.o.  Admit date:  11/24/2022                     Discharge date: 11/27/2022      Discharge Attending:  Ladean Raya, MD    Discharge Summary Completed By: Ladean Raya, MD    Service: Med Private [phone]    Reason for hospitalization:  Pneumonia due to COVID-19 virus [U07.1, J12.82]    Primary Discharge Diagnosis:   Same as Above    Hospital Diagnoses:  Hospital Problems        Active Problems    * (Principal) Pneumonia due to COVID-19 virus     Present on Admission:   Pneumonia due to COVID-19 virus        Significant Past Medical History        Acute on chronic systolic heart failure, NYHA class 2 (HCC)  Amyloidosis (HCC)  Aortic valve stenosis, mild  Arrhythmia      Comment:  new onset afib-sched for cardioversion 05/082024  Arterial occlusion      Comment:  left kidney-two stents placed  Arthritis      Comment:  lower back  Asthma  CAD (coronary artery disease)  Cardiomyopathy (HCC)  Carotid artery plaque  CHF (congestive heart failure) (HCC)  Colon polyps  COPD (chronic obstructive pulmonary disease) (HCC)  Coronary atherosclerosis  Dizziness  Dyspnea  Frailty  H/O: CVA (cardiovascular accident)  History of renal artery stenosis  HTN  Hyperlipidemia  Hyperlipidemia  Hypertension  Hypothyroidism  Kidney disease  Lung disease  Peripheral vascular disease (HCC)  Tobacco abuse  Valvular heart disease    Allergies   Patient has no known allergies.    Brief Hospital Course   The patient was admitted and the following issues were addressed during this hospitalization: (with pertinent details including admission exam/imaging/labs).      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  Patient is diagnosed with COVID-19 infection and possible left lower lobe pneumonia, considering his age and comorbidities he was started on intravenous remdesivir and received dexamethasone for wheezing, patient did not require supplemental oxygen after admission from emergency room.  Patient received supportive respiratory care including Mucinex, DuoNebs, flutter valve and incentive spirometry, patient reported significant improvement in symptoms and he was able to tolerate p.o. diet. Patient was recently started on prednisone and doxycycline, he completed total 7 days of treatment.  He had bedside oximetry by nursing and his saturations remained above 95%.  Patient is deemed stable for discharge and dismissed from the hospital with his daughter.    Items Needing Follow Up   Pending items or areas that need to be addressed at follow up: NA    Pending Labs and Follow Up Radiology    Pending labs  and/or radiology review at this time of discharge are listed below: Please note- any labs with collected status will not have a result; if this area is blank, there are no items for review.         Medications      Medication List      START taking these medications     benzonatate 200 mg capsule; Commonly known as: TESSALON; Dose: 200 mg;   Take one capsule by mouth every 8 hours. Indications: cough; For: cough;   Quantity: 20 capsule; Refills: 0   guaiFENesin LA 600 mg tablet; Commonly known as: MUCINEX; Dose: 600 mg;   Take one tablet by mouth twice daily. Indications: cough; For: cough;   Quantity: 30 tablet; Refills: 0     CHANGE how you take these medications     montelukast 10 mg tablet; Commonly known as: SINGULAIR; Dose: 10 mg;   Take one tablet by mouth at bedtime daily.; Quantity: 30 tablet; Refills:   1; What changed: when to take this     CONTINUE taking these medications     * albuterol 0.083% 2.5 mg /3 mL (0.083 %) nebulizer solution; Commonly   known as: PROVENTIL; Dose: 3 mL; Refills: 0   * albuterol sulfate 90 mcg/actuation HFA aerosol inhaler; Commonly known   as: VENTOLIN HFA; Dose: 2 puff; Inhale two puffs by mouth into the lungs   every 6 hours as needed for Wheezing or Shortness of Breath. Shake well   before use.; Quantity: 2 Inhaler; Refills: 1   ascorbic acid (vitamin C) 500 mg tablet; Dose: 500 mg; Refills: 0   atorvastatin 20 mg tablet; Commonly known as: LIPITOR; Dose: 20 mg; Take   one tablet by mouth daily.; Quantity: 90 tablet; Refills: 3   budesonide 0.5 mg/2 mL nebulizer solution; Commonly known as: PULMICORT;   Dose: 2 mL; Refills: 0   CHOLEcalciferoL (vitamin D3) 125 mcg (5,000 unit) capsule; Commonly   known as: DIALYVITE VITAMIN D; Dose: 5,000 Units; Refills: 0   clopiDOGreL 75 mg tablet; Commonly known as: PLAVIX; Dose: 75 mg; Take   one tablet by mouth daily.; Quantity: 30 tablet; Refills: 1   dutasteride 0.5 mg capsule; Commonly known as: AVODART; Dose: 0.5 mg;   Refills: 0   ELIQUIS 2.5 mg tablet; Generic drug: apixaban; Dose: 2.5 mg; TAKE 1   TABLET BY MOUTH TWO TIMES A DAY; Quantity: 180 tablet; Refills: 3   food supplemt, lactose-reduced 0.05 gram- 1.5 kcal/mL oral liquid;   Commonly known as: ENSURE PLUS; Dose: 1 Can; Refills: 0   furosemide 20 mg tablet; Commonly known as: LASIX; Dose: 20 mg; Take one   tablet by mouth daily as needed.; Quantity: 90 tablet; Refills: 0   levothyroxine 88 mcg tablet; Commonly known as: SYNTHROID; Dose: 88 mcg;   Refills: 0   metoprolol succinate XL 25 mg extended release tablet; Commonly known   as: TOPROL XL; Dose: 25 mg; TAKE 1 TABLET BY MOUTH TWO TIMES A DAY;   Quantity: 60 tablet; Refills: 3   MYRBETRIQ 25 mg ER tablet; Generic drug: mirabegron; Dose: 25 mg; Refills: 0   other medication; Refills: 0   pantoprazole DR 40 mg tablet; Commonly known as: PROTONIX; Dose: 40 mg;   Take one tablet by mouth daily.; Quantity: 30 tablet; Refills: 1   zinc sulfate 220 mg (50 mg elemental zinc) capsule; Dose: 220 mg;   Refills: 0  * This list has 2 medication(s) that are the same as other medications  prescribed for you. Read the directions carefully, and ask your doctor or   other care provider to review them with you.     STOP taking these medications     doxycycline monohydrate 100 mg capsule; Commonly known as: MONODOX       Return Appointments and Scheduled Appointments     Scheduled appointments:      Mar 29, 2023 9:00 AM  Echo Doppler with [facility] ECHO  Cardiovascular Medicine: [facility] (CVM Procedural) [phone] Kathie Rhodes [address]  [address]  [place] [address]  [phone]     Apr 11, 2023 9:30 AM  Office visit with Dorris Fetch, MD  Cardiovascular Medicine: [facility] (CVM Exam) [address]  [address]  [place] [address]  [phone]          Things you need to do       Follow up with Lona Kettle, MD    Phone: [phone]    Where: [address], [place] [address]          Consults, Procedures, Diagnostics, Micro, Pathology   Consults: None  Surgical Procedures & Dates: None  Significant Diagnostic Studies, Micro and Procedures: noted in brief hospital course  Significant Pathology: noted in brief hospital course                                   Discharge Disposition, Condition   Patient Disposition: Home or Self Care [01]  Condition at Discharge: Stable    Code Status   Prior    Patient Instructions     Activity       Activity as Tolerated   As directed      It is important to keep increasing your activity level after you leave the hospital.  Moving around can help prevent blood clots, lung infection (pneumonia) and other problems.  Gradually increasing the number of times you are up moving around will help you return to your normal activity level more quickly.  Continue to increase the number of times you are up to the chair and walking daily to return to your normal activity level. Begin to work toward your normal activity level at discharge          Diet       Regular Diet   As directed      You have no dietary restriction. Please continue with a healthy balanced diet.             Discharge education provided to patient., Signs and Symptoms:   Report these signs and symptoms       Report These Signs and Symptoms   As directed      Please contact your doctor if you have any of the following symptoms: temperature higher than 100.4 degrees F, persistent nausea and/or vomiting, difficulty breathing, chest pain, severe abdominal pain, headache, or unable to urinate        , and Education:     Additional Orders: Case Management, Supplies, Home Health     Home Health/DME                HOME HEALTH/DME  ONCE        Comments: Home Health/Durable Medical Equipment Order Details    Patient Name:  Javier Gutierrez  Medical Record Number:   [id number]      Patient Height: 177.8 cm (5' 10)   Patient Weight: 74.7 kg (164 lb 9.6 oz)    Provider Information:    MD NPI: [id number]        Agency Instructions:     Start of care within 24-72 hours of hospital discharge, anticipated discharge 11-27-22. RN to complete general assessment, including vitals with temperature, monitor and teach patient and/or caregiver medication management and compliance, monitor and teach pain management and pain medication compliance when necessary. Monitor and teach signs and symptoms of infection, monitor and teach disease management, including signs and symptoms, diet, who and when to call, hospital readmission avoidance. Please see attached AVS for additional discharge instruction. and Physical Therapy  to evaluate and treat along with home safety evaluation.  Teach strategies for energy conservation, mobility training, strengthening, balance activities and address deficits, maximize function and improve safety.   and Occupational Therapy   to evaluate and treat along with home safety evaluation.  Teach strategies for energy conservation, mobility training, strengthening, balance activities and address deficits, maximize function and improve safety.    Clinical findings to support homebound status: Unsteady gait  Clinical findings to support home care services: Muscle weakness affecting functional activities    I certify that this patient is under my care and that I, or a nurse practitioner or physician's assistant working with me, had a face-to-face encounter that meets the physician's face-to-face encounter requirements with this patient on 11-27-22  .    This patient is under my care, and I have initiated the establishment of the plan of care.  This patient will be followed by a physician after discharge, who will periodically review the plan of care.   Question Answer Comment   Attending Name/Contact Dr Autumn Patty    PCP Name/Contact Lona Kettle   [phone]   [phone]    Home Health to Follow PCP                             Signed:  Ladean Raya, MD  11/27/2022      cc:  Primary Care Physician:  Lona Kettle       Referring physicians:  No ref. provider found   Additional provider(s):        Did we miss something? If additional records are needed, please fax a request on office letterhead to [phone]. Please include the patient's name, date of birth, fax number and type of information needed. Additional request can be made by email at [email or web address]. For general questions of information about electronic records sharing, call [phone].

## 2022-11-27 NOTE — Progress Notes
 Day of Discharge Note    Day of discharge progress note for Javier Gutierrez    Chart data reviewed including medications, consultation notes, lab, vitals, imaging.  Patient was seen and examined with pertinent information listed below.    Subjective: Patient is seen and examined bedside, he is sitting on the bed and eating breakfast with his daughter in the room, patient reports his cough had improved now clear from whitish sputum, no chest tightness or wheezing, tolerating p.o. diet able to ambulate comfortably in the room    Exam:     BP: 149/74 (11/05 0843)  Temp: 36.3 ?C (97.4 ?F) (11/05 8657)  Pulse: 97 (11/05 0843)  Respirations: 18 PER MINUTE (11/05 0843)  SpO2: 97 % (11/05 1043)  O2 Device: None (Room air) (11/05 1043)    General appearance: cooperative and no distress   Neurologic: Alert and oriented X 3, normal strength and tone.  Lungs: Bilateral diffuse coarse breath sounds, no wheezing  Heart: regular rate and rhythm, S1, S2 normal, no murmur or gallop  Abdomen: soft, non-tender. Bowel sounds present, no organomegaly  Extremities: no edema, redness or tenderness in the calves or thighs  Skin: warm, no rashes  Psych: normal affect and insight       Discharge plans and pertinent follow up items after discharge:       [place].  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  Patient is diagnosed with COVID-19 infection and possible left lower lobe pneumonia, considering his age and comorbidities he was started on intravenous remdesivir and received dexamethasone for wheezing, patient did not require supplemental oxygen after admission from emergency room.  Patient received supportive respiratory care including Mucinex, DuoNebs, flutter valve and incentive spirometry, patient reported significant improvement in symptoms and he was able to tolerate p.o. diet. Patient was recently started on prednisone and doxycycline, he completed total 7 days of treatment.  He had bedside oximetry by nursing and his saturations remained above 95%.  Patient is deemed stable for discharge and dismissed from the hospital with his daughter.      Patient feels comfortable with plans for discharge.  All questions were answered.  Discharge discussion, including follow up/discharge instructions, occurred with patient face-to-face.      Ladean Raya, MD  11/27/2022    Discharge Planning: greater than 30 minutes spent in pt dc care today spent counseling pt, coordinating dc care, placing dc orders and helping complete dc summary.

## 2023-01-12 ENCOUNTER — Emergency Department
Admit: 2023-01-12 | Discharge: 2023-01-12 | Payer: MEDICARE | Attending: Student in an Organized Health Care Education/Training Program

## 2023-01-12 ENCOUNTER — Encounter: Admit: 2023-01-12 | Discharge: 2023-01-12 | Payer: MEDICARE

## 2023-01-12 ENCOUNTER — Emergency Department
Admit: 2023-01-12 | Discharge: 2023-01-13 | Disposition: A | Discharge status: Home / Self Care | Payer: MEDICARE | Attending: Student in an Organized Health Care Education/Training Program

## 2023-02-13 ENCOUNTER — Encounter: Admit: 2023-02-13 | Discharge: 2023-02-13 | Payer: MEDICARE

## 2023-02-13 MED ORDER — METOPROLOL SUCCINATE 25 MG PO TB24
25 mg | ORAL_TABLET | Freq: Two times a day (BID) | ORAL | 3 refills | 90.00000 days | Status: AC
Start: 2023-02-13 — End: ?

## 2023-02-28 ENCOUNTER — Encounter: Admit: 2023-02-28 | Discharge: 2023-02-28 | Payer: MEDICARE

## 2023-03-10 ENCOUNTER — Encounter: Admit: 2023-03-10 | Discharge: 2023-03-10 | Payer: MEDICARE

## 2023-03-10 ENCOUNTER — Inpatient Hospital Stay: Admit: 2023-03-10 | Discharge: 2023-03-12 | Disposition: A | Discharge status: Home Health | Payer: MEDICARE

## 2023-03-10 ENCOUNTER — Ambulatory Visit: Admit: 2023-03-10 | Discharge: 2023-03-10 | Payer: MEDICARE

## 2023-03-10 ENCOUNTER — Emergency Department: Admit: 2023-03-10 | Discharge: 2023-03-10 | Payer: MEDICARE

## 2023-03-10 DIAGNOSIS — N200 Calculus of kidney: Secondary | ICD-10-CM

## 2023-03-10 DIAGNOSIS — R310 Gross hematuria: Secondary | ICD-10-CM

## 2023-03-10 DIAGNOSIS — N179 Acute kidney failure, unspecified: Secondary | ICD-10-CM

## 2023-03-10 DIAGNOSIS — I161 Hypertensive emergency: Secondary | ICD-10-CM

## 2023-03-10 LAB — CBC AND DIFF
~~LOC~~ BKR ABSOLUTE BASO COUNT: 0.1 10*3/uL (ref 0.00–0.20)
~~LOC~~ BKR ABSOLUTE EOS COUNT: 0.2 10*3/uL (ref 0.00–0.45)
~~LOC~~ BKR ABSOLUTE MONO COUNT: 1.6 10*3/uL — ABNORMAL HIGH (ref 0.00–0.80)
~~LOC~~ BKR BASOPHILS %: 1 % (ref 0–2)
~~LOC~~ BKR EOSINOPHILS %: 2 % (ref 0–5)
~~LOC~~ BKR LYMPHOCYTES %: 19 % — ABNORMAL LOW (ref 24–44)
~~LOC~~ BKR MCV: 80 fL (ref 80.0–100.0)
~~LOC~~ BKR MDW (MONOCYTE DISTRIBUTION WIDTH): 19 (ref <=20.6)
~~LOC~~ BKR MONOCYTES %: 15 % — ABNORMAL HIGH (ref 4–12)
~~LOC~~ BKR RBC COUNT: 5.1 10*6/uL (ref 4.40–5.50)
~~LOC~~ BKR WBC COUNT: 10 10*3/uL (ref 4.50–11.00)

## 2023-03-10 LAB — URINALYSIS DIPSTICK REFLEX TO CULTURE
~~LOC~~ BKR GLUCOSE,UA: NEGATIVE
~~LOC~~ BKR LEUKOCYTES: NEGATIVE
~~LOC~~ BKR NITRITE: NEGATIVE
~~LOC~~ BKR URINE BILE: NEGATIVE
~~LOC~~ BKR URINE KETONE: NEGATIVE
~~LOC~~ BKR URINE PH: 6 /HPF — AB (ref 5.0–8.0)
~~LOC~~ BKR URINE SPEC GRAVITY: 1 /HPF (ref 1.005–1.030)

## 2023-03-10 LAB — COMPREHENSIVE METABOLIC PANEL
~~LOC~~ BKR ALBUMIN: 3.7 g/dL (ref 3.5–5.0)
~~LOC~~ BKR ANION GAP: 8 10*3/uL (ref 3–12)
~~LOC~~ BKR BLD UREA NITROGEN: 29 mg/dL — ABNORMAL HIGH (ref 7–25)
~~LOC~~ BKR CALCIUM: 9.1 mg/dL — ABNORMAL HIGH (ref 8.5–10.6)
~~LOC~~ BKR CHLORIDE: 101 mmol/L (ref 98–110)
~~LOC~~ BKR CREATININE: 1.5 mg/dL — ABNORMAL HIGH (ref 0.40–1.24)
~~LOC~~ BKR GLOMERULAR FILTRATION RATE (GFR): 45 mL/min — ABNORMAL LOW (ref >60–4.80)
~~LOC~~ BKR POTASSIUM: 4.4 mmol/L (ref 3.5–5.1)
~~LOC~~ BKR TOTAL BILIRUBIN: 0.6 mg/dL (ref 0.2–1.3)
~~LOC~~ BKR TOTAL PROTEIN: 6.7 g/dL (ref 6.0–8.0)

## 2023-03-10 LAB — POC CREATININE: ~~LOC~~ BKR POC CREATININE: 1.5 mg/dL — ABNORMAL HIGH (ref 0.4–1.24)

## 2023-03-10 MED ORDER — OXYCODONE 5 MG PO TAB
5 mg | ORAL | 0 refills | Status: DC | PRN
Start: 2023-03-10 — End: 2023-03-11

## 2023-03-10 MED ORDER — MELATONIN 5 MG PO TAB
5 mg | Freq: Every evening | ORAL | 0 refills | Status: DC | PRN
Start: 2023-03-10 — End: 2023-03-13

## 2023-03-10 MED ORDER — NICARDIPINE IN NACL (ISO-OS) 20 MG/200 ML (0.1 MG/ML) IV PGBK
5-15 mg/h | INTRAVENOUS | 0 refills | Status: DC
Start: 2023-03-10 — End: 2023-03-11
  Administered 2023-03-10: 2.5 mg/h via INTRAVENOUS

## 2023-03-10 MED ORDER — ONDANSETRON 4 MG PO TBDI
4 mg | ORAL | 0 refills | Status: DC | PRN
Start: 2023-03-10 — End: 2023-03-13

## 2023-03-10 MED ORDER — CLOPIDOGREL 75 MG PO TAB
75 mg | Freq: Every day | ORAL | 0 refills | Status: DC
Start: 2023-03-10 — End: 2023-03-13
  Administered 2023-03-11 – 2023-03-12 (×2): 75 mg via ORAL

## 2023-03-10 MED ORDER — IOHEXOL 350 MG IODINE/ML IV SOLN
80 mL | Freq: Once | INTRAVENOUS | 0 refills | Status: CP
Start: 2023-03-10 — End: ?
  Administered 2023-03-10: 23:00:00 80 mL via INTRAVENOUS

## 2023-03-10 MED ORDER — METOPROLOL SUCCINATE 50 MG PO TB24
25 mg | Freq: Once | ORAL | 0 refills | Status: CP
Start: 2023-03-10 — End: ?
  Administered 2023-03-10: 23:00:00 25 mg via ORAL

## 2023-03-10 MED ORDER — PANTOPRAZOLE 40 MG PO TBEC
40 mg | Freq: Every day | ORAL | 0 refills | Status: DC
Start: 2023-03-10 — End: 2023-03-13
  Administered 2023-03-11 – 2023-03-12 (×2): 40 mg via ORAL

## 2023-03-10 MED ORDER — ONDANSETRON HCL (PF) 4 MG/2 ML IJ SOLN
4 mg | INTRAVENOUS | 0 refills | Status: DC | PRN
Start: 2023-03-10 — End: 2023-03-13

## 2023-03-10 MED ORDER — DUTASTERIDE 0.5 MG PO CAP
.5 mg | Freq: Every day | ORAL | 0 refills | Status: DC
Start: 2023-03-10 — End: 2023-03-13
  Administered 2023-03-11 – 2023-03-12 (×2): 0.5 mg via ORAL

## 2023-03-10 MED ORDER — BUDESONIDE 0.5 MG/2 ML IN NBSP
.5 mg | Freq: Two times a day (BID) | RESPIRATORY_TRACT | 0 refills | Status: DC
Start: 2023-03-10 — End: 2023-03-13
  Administered 2023-03-11 – 2023-03-12 (×3): 0.5 mg via RESPIRATORY_TRACT

## 2023-03-10 MED ORDER — MONTELUKAST 10 MG PO TAB
10 mg | Freq: Every day | ORAL | 0 refills | Status: DC
Start: 2023-03-10 — End: 2023-03-13
  Administered 2023-03-11 – 2023-03-12 (×2): 10 mg via ORAL

## 2023-03-10 MED ORDER — ATORVASTATIN 10 MG PO TAB
20 mg | Freq: Every day | ORAL | 0 refills | Status: DC
Start: 2023-03-10 — End: 2023-03-13
  Administered 2023-03-11 – 2023-03-12 (×2): 20 mg via ORAL

## 2023-03-10 MED ORDER — SODIUM CHLORIDE 0.9 % IJ SOLN
50 mL | Freq: Once | INTRAVENOUS | 0 refills | Status: CP
Start: 2023-03-10 — End: ?
  Administered 2023-03-10: 23:00:00 50 mL via INTRAVENOUS

## 2023-03-10 MED ORDER — APIXABAN 5 MG PO TAB
2.5 mg | Freq: Two times a day (BID) | ORAL | 0 refills | Status: DC
Start: 2023-03-10 — End: 2023-03-13
  Administered 2023-03-11 – 2023-03-12 (×4): 2.5 mg via ORAL

## 2023-03-10 MED ORDER — MIRABEGRON 25 MG PO TB24
25 mg | Freq: Every day | ORAL | 0 refills | Status: DC
Start: 2023-03-10 — End: 2023-03-13
  Administered 2023-03-11 – 2023-03-12 (×2): 25 mg via ORAL

## 2023-03-10 MED ORDER — ALBUTEROL SULFATE 90 MCG/ACTUATION IN HFAA
2 | RESPIRATORY_TRACT | 0 refills | Status: DC | PRN
Start: 2023-03-10 — End: 2023-03-13

## 2023-03-10 MED ORDER — LEVOTHYROXINE 88 MCG PO TAB
88 ug | Freq: Every day | ORAL | 0 refills | Status: DC
Start: 2023-03-10 — End: 2023-03-13
  Administered 2023-03-11 – 2023-03-12 (×2): 88 ug via ORAL

## 2023-03-10 MED ORDER — ACETAMINOPHEN 500 MG PO TAB
1000 mg | ORAL | 0 refills | Status: DC | PRN
Start: 2023-03-10 — End: 2023-03-13

## 2023-03-10 MED ORDER — MAGNESIUM HYDROXIDE 400 MG/5 ML PO SUSP
30 mL | ORAL | 0 refills | Status: DC | PRN
Start: 2023-03-10 — End: 2023-03-13

## 2023-03-10 MED ORDER — METOPROLOL SUCCINATE 50 MG PO TB24
25 mg | Freq: Two times a day (BID) | ORAL | 0 refills | Status: DC
Start: 2023-03-10 — End: 2023-03-11
  Administered 2023-03-11: 14:00:00 25 mg via ORAL

## 2023-03-10 NOTE — Patient Instructions
 Great to meet you today, due to limited capabilities through telehealth is it my recommendation that you go to the ER at this time for further evaluation and management.  Take care.

## 2023-03-10 NOTE — H&P (View-Only)
 Admission History and Physical Examination    Name: Javier Gutierrez   MRN: [id number]     DOB: [birth year]      Age: 83 y.o.  Admission Date: 03/10/2023     LOS: 0 days     Date of Service: 03/10/2023                       Assessment/Plan:    Principal Problem:    Hypertensive emergency  Active Problems:    Cardiomyopathy [facility])    History of renal artery stenosis    Cerebral embolism with cerebral infarction [facility])    Hypertension    Hyperlipidemia    Bilateral carotid artery disease (HCC)    Left anterior fascicular block    CAD (coronary artery disease)    Stage 3 chronic kidney disease (HCC)    S/P TAVR (transcatheter aortic valve replacement)    Hematuria    Nephrolith    Hydronephrosis of left kidney    #Left nephrolithiasis  -CT abdomen pelvis shows a 4 mm obstructing stone in the left atrophic kidney  -Patient has not been passing clots, reports no symptoms related to the stone, has not developed anemia due to hematuria  -Appreciate urology input, no plans for surgical intervention    #Hypertensive urgency  -Developed while patient was in the ER.  Patient reports compliance with all of his medications  -Started on Cardene drip in the ER, will continue and try to wean off.  Target MAP initially 100-105  -Continue PTA metoprolol    #Paroxysmal atrial fibrillation  -Continue PTA apixaban and Toprol for rate control    #PAD s/p left carotid stent  -Continue PTA Plavix and atorvastatin    #Hypothyroidism  -Continue PTA levothyroxine    Diet: Regular  DVT prophylaxis: On apixaban  Code status: Full Code     I have personally provided 45 minutes of critical care time exclusive of time spent on separately billable procedures. Time includes review of laboratory data, radiology results, discussion with ER team and monitoring for potential decompensation. Interventions were performed as documented.    Upon my evaluation, this patient had a high probability of imminent or life-threatening deterioration due to hypertensive urgency requiring nicardipine infusion and extensive blood pressure monitoring, which required my direct attention, intervention, and personal management.    __________________________________________________________________________________  Primary Care Physician: Lona Kettle      Chief Complaint: Hematuria  History of Present Illness: Javier Gutierrez is a 83 y.o. male with past medical history of severe aortic stenosis s/p TAVR, history of CVA s/p left ICA stent in 2022, nonobstructive CAD, paroxysmal A-fib, CKD, PAD, and DLP presents to the ER due to dark urine.  Patient's daughter is at bedside and aids with history.  He for started noticing dark urine yesterday, daughter says it appeared as if it was concentrated urine yesterday but progressed to frank blood today.  Denies passing clots.  Patient denies any flank pain, suprapubic pain, abdominal pain, pain with urination.  No clots were seen past the urine.  He denies urinary urgency, hesitancy, dysuria, fever, chills.  He reports compliance with all of his medications including Eliquis, home health nurse instructed patient to hold morning dose of Eliquis today due to hematuria.    Past Medical History:    Acute on chronic systolic heart failure, NYHA class 2 (HCC)    Amyloidosis (HCC)    Aortic valve stenosis, mild    Arrhythmia  Arterial occlusion    Arthritis    Asthma    CAD (coronary artery disease)    Cardiomyopathy (HCC)    Carotid artery plaque    CHF (congestive heart failure) (HCC)    Colon polyps    COPD (chronic obstructive pulmonary disease) (HCC)    Coronary atherosclerosis    Dizziness    Dyspnea    Frailty    H/O: CVA (cardiovascular accident)    History of renal artery stenosis    HTN    Hyperlipidemia    Hyperlipidemia    Hypertension    Hypothyroidism    Kidney disease    Lung disease    Peripheral vascular disease (HCC)    Tobacco abuse    Valvular heart disease     Surgical History:   Procedure Laterality Date    HX LUMBAR DISKECTOMY  01/22/1966    30 years ago    STENT INTRAVASCULAR  01/23/2004    kidney stent placed    Left Heart Catheterization With Ventriculogram Left 03/11/2015    Performed by Marcell Barlow, MD, [facility] at [facility] CATH LAB    Coronary Angiography N/A 03/11/2015    Performed by Marcell Barlow, MD, [facility] at [facility] CATH LAB    Possible Percutaneous Coronary Intervention N/A 03/11/2015    Performed by Marcell Barlow, MD, [facility] at [facility] CATH LAB    ESOPHAGOGASTRODUODENOSCOPY N/A 04/26/2015    Performed by Tempie Hoist, DO at [facility] ENDO    ESOPHAGOGASTRODUODENOSCOPY BIOPSY  04/26/2015    Performed by Tempie Hoist, DO at [facility] ENDO    REPLACEMENT TRANSCATHETER AORTIC VALVE (Sapien 26s3), right common femoral artery approach N/A 08/03/2015    Performed by Zella Richer, MD at [facility] CVOR    BRONCHOSCOPY Bilateral 11/30/2016    Performed by Audie Box, MD at [facility] OR    BRONCHOSCOPY WITH BRONCHIAL ALVEOLAR LAVAGE - FLEXIBLE Right 11/30/2016    Performed by Audie Box, MD at [facility] OR    ESOPHAGOGASTRODUODENOSCOPY for PEG placement N/A 12/06/2016    Performed by Buckles, Vinnie Level, MD at [facility] ENDO    ENDARTERECTOMY CAROTID ARTERY Left 02/17/2018    Performed by Buck Mam, MD at [facility] OR    ANOMALOUS VENOUS RETURN REPAIR  2017    ECHOCARDIOGRAM PROCEDURE  05/2022     Family History   Problem Relation Name Age of Onset    Stroke Mother [place]     Other Mother [place] [facility]         colon cancer    Other Father          valve disease/COPD     Social History     Tobacco Use    Smoking status: Former     Current packs/day: 0.00     Average packs/day: 1.5 packs/day for 60.0 years (90.0 ttl pk-yrs)     Types: Cigarettes     Start date: 02/13/1958     Quit date: 02/13/2018     Years since quitting: 5.0    Smokeless tobacco: Never    Tobacco comments:     0.5-2 ppd history   Vaping Use    Vaping status: Never Used   Substance and Sexual Activity    Alcohol use: No    Drug use: No    Sexual activity: Not Currently Partners: Female           Immunizations (includes history and patient reported):   Immunization History   Administered Date(s)  Administered    COVID-19 (MODERNA), mRNA vacc, 100 mcg/0.5 mL (PF) 02/19/2019, 03/19/2019    COVID-19 Bivalent (9YR+)(PFIZER), mRNA vacc, 66mcg/0.3mL 10/31/2020    Covid-19 mRNA Vaccine >=12yo (Moderna)(Spikevax) 11/05/2021    Flu Vaccine =>3 YO (Historical) 10/21/2016    Flu Vaccine =>65 YO High-Dose (PF) 12/12/2016    Pneumococcal Vaccine (23-Val Adult) 12/12/2016    Tdap Vaccine 06/29/2018           Allergies:  Patient has no known allergies.    Medications:  (Not in a hospital admission)   Current Facility-Administered Medications   Medication    apixaban (ELIQUIS) tablet 2.5 mg    [START ON 03/11/2023] atorvastatin (LIPITOR) tablet 20 mg    [START ON 03/11/2023] metoprolol succinate XL (TOPROL XL) tablet 25 mg    niCARdipine (cardENE) 20 mg/200 mL NS IV drip (std conc)(premade)     Current Outpatient Medications   Medication Sig    albuterol (VENTOLIN HFA) 90 mcg/actuation inhaler Inhale two puffs by mouth into the lungs every 6 hours as needed for Wheezing or Shortness of Breath. Shake well before use.    albuterol 0.083% (PROVENTIL) 2.5 mg /3 mL (0.083 %) nebulizer solution Inhale 3 mL solution by nebulizer as directed twice daily as needed.    apixaban (ELIQUIS) 2.5 mg tablet TAKE 1 TABLET BY MOUTH TWO TIMES A DAY    ascorbic acid (vitamin C) 500 mg tablet Take one tablet by mouth daily.    atorvastatin (LIPITOR) 20 mg tablet Take one tablet by mouth daily.    benzonatate (TESSALON) 200 mg capsule Take one capsule by mouth every 8 hours. Indications: cough    budesonide (PULMICORT) 0.5 mg/2 mL nebulizer solution Inhale 2 mL solution by nebulizer as directed twice daily.    CHOLEcalciferoL (vitamin D3) (DIALYVITE VITAMIN D) 125 mcg (5,000 unit) capsule Take one capsule by mouth daily.    clopiDOGrel (PLAVIX) 75 mg tablet Take one tablet by mouth daily.    dutasteride (AVODART) 0.5 mg PO capsule Take one capsule by mouth daily.    food supplemt, lactose-reduced (ENSURE PLUS) 0.05 gram- 1.5 kcal/mL oral liquid Take 1 Can by mouth twice daily with meals.    furosemide (LASIX) 20 mg tablet Take one tablet by mouth daily as needed.    guaiFENesin LA (MUCINEX) 600 mg tablet Take one tablet by mouth twice daily. Indications: cough    levothyroxine (SYNTHROID) 88 mcg tablet Take one tablet by mouth daily 30 minutes before breakfast.    metoprolol succinate XL (TOPROL XL) 25 mg extended release tablet TAKE 1 TABLET BY MOUTH TWO TIMES A DAY    montelukast (SINGULAIR) 10 mg tablet Take one tablet by mouth at bedtime daily. (Patient taking differently: Take one tablet by mouth daily.)    MYRBETRIQ 25 mg tablet Take one tablet by mouth daily.    other medication Prevagen Regular Strength - Take 1 capsule by mouth daily    pantoprazole DR (PROTONIX) 40 mg tablet Take one tablet by mouth daily.    zinc sulfate 220 mg (50 mg elemental zinc) capsule Take one capsule by mouth daily.       Review of Systems   All other systems reviewed and are negative.      Physical Exam  Constitutional:       Appearance: Normal appearance.   HENT:      Head: Normocephalic.      Mouth/Throat:      Mouth: Mucous membranes are moist.   Eyes:  Extraocular Movements: Extraocular movements intact.      Pupils: Pupils are equal, round, and reactive to light.   Cardiovascular:      Rate and Rhythm: Normal rate and regular rhythm.      Heart sounds: Normal heart sounds.   Pulmonary:      Effort: Pulmonary effort is normal.      Breath sounds: Normal breath sounds.   Abdominal:      General: Abdomen is flat.      Palpations: Abdomen is soft.      Tenderness: There is no abdominal tenderness.   Musculoskeletal:         General: Normal range of motion.   Skin:     General: Skin is warm.      Capillary Refill: Capillary refill takes less than 2 seconds.   Neurological:      General: No focal deficit present.      Mental Status: He is alert and oriented to person, place, and time.       Vital Signs: Last Filed In 24 Hours Vital Signs: 24 Hour Range   BP: 163/72 (02/16 1830)  Temp: 36.8 ?C (98.3 ?F) (02/16 1336)  Pulse: 105 (02/16 1830)  Respirations: 16 PER MINUTE (02/16 1336)  SpO2: 97 % (02/16 1830)  O2 Device: None (Room air) (02/16 1336) BP: (160-237)/(71-116)   Temp:  [36.8 ?C (98.3 ?F)]   Pulse:  [76-106]   Respirations:  [16 PER MINUTE]   SpO2:  [95 %-98 %]   O2 Device: None (Room air)          Lab/Radiology/Other Diagnostic Tests:  24-hour labs:    Results for orders placed or performed during the hospital encounter of 03/10/23 (from the past 24 hours)   URINALYSIS DIPSTICK REFLEX TO CULTURE    Collection Time: 03/10/23  2:01 PM    Specimen: Midstream; Urine   Result Value Ref Range    Color,UA Red     Turbidity,UA 2+ (A) Clear    Specific Gravity-Urine 1.021 1.005 - 1.030    pH,UA 6.0 5.0 - 8.0    Protein,UA 2+ (A) Negative    Glucose,UA Negative Negative    Ketones,UA Negative Negative    Bilirubin,UA Negative Negative    Blood,UA 3+ (A) Negative    Urobilinogen,UA Normal Normal    Nitrite,UA Negative Negative    Leukocytes,UA Negative Negative   URINALYSIS MICROSCOPIC REFLEX TO CULTURE    Collection Time: 03/10/23  2:01 PM    Specimen: Midstream; Urine   Result Value Ref Range    WBCs,UA 0 - 2 None, 0 - 2  /HPF    RBCs,UA Packed (A) None, 0 - 2  /HPF   CBC AND DIFF    Collection Time: 03/10/23  2:59 PM   Result Value Ref Range    White Blood Cells 10.50 4.50 - 11.00 10*3/uL    Red Blood Cells 5.17 4.40 - 5.50 10*6/uL    Hemoglobin 13.5 13.5 - 16.5 g/dL    Hematocrit 56.2 13.0 - 50.0 %    MCV 80.9 80.0 - 100.0 fL    MCH 26.2 26.0 - 34.0 pg    MCHC 32.3 32.0 - 36.0 g/dL    RDW 86.5 (H) 78.4 - 15.0 %    Platelet Count 229 150 - 400 10*3/uL    MPV 8.9 7.0 - 11.0 fL    Neutrophils 63 41 - 77 %    Lymphocytes 19 (L) 24 - 44 %    Monocytes  15 (H) 4 - 12 %    Eosinophils 2 0 - 5 %    Basophils 1 0 - 2 %    Absolute Neutrophil Count 6.60 1.80 - 7.00 10*3/uL    Absolute Lymph Count 2.00 1.00 - 4.80 10*3/uL    Absolute Monocyte Count 1.60 (H) 0.00 - 0.80 10*3/uL    Absolute Eosinophil Count 0.20 0.00 - 0.45 10*3/uL    Absolute Basophil Count 0.10 0.00 - 0.20 10*3/uL    MDW (Monocyte Distribution Width) 19.4 <=20.6   COMPREHENSIVE METABOLIC PANEL    Collection Time: 03/10/23  2:59 PM   Result Value Ref Range    Sodium 138 137 - 147 mmol/L    Potassium 4.4 3.5 - 5.1 mmol/L    Chloride 101 98 - 110 mmol/L    Glucose 89 70 - 100 mg/dL    Blood Urea Nitrogen 29 (H) 7 - 25 mg/dL    Creatinine 1.61 (H) 0.40 - 1.24 mg/dL    Calcium 9.1 8.5 - 09.6 mg/dL    Total Protein 6.7 6.0 - 8.0 g/dL    Total Bilirubin 0.6 0.2 - 1.3 mg/dL    Albumin 3.7 3.5 - 5.0 g/dL    Alk Phosphatase 66 25 - 110 U/L    AST 12 7 - 40 U/L    ALT 10 7 - 56 U/L    CO2 29 21 - 30 mmol/L    Anion Gap 8 3 - 12    Glomerular Filtration Rate (GFR) 45 (L) >60 mL/min   POC CREATININE    Collection Time: 03/10/23  3:01 PM   Result Value Ref Range    Creatinine, POC 1.5 (H) 0.4 - 1.24 mg/dL     Glucose: 89 (04/54/09 1459)  Pertinent radiology reviewed.    Pia Mau, MD  Please contact assigned team through [facility]

## 2023-03-10 NOTE — Telephone Encounter
 Spoke with [name], at [facility] [place] now.

## 2023-03-10 NOTE — Telephone Encounter
 Daughter Lorene Dy calle d- Elijah Birk having blood in urine and was see in [facility] this am and told to go to ED but daughter is askin gfor a call back - she has questions

## 2023-03-10 NOTE — Progress Notes
 [facility]UC Visit Note    Date of Service: 03/10/2023    Subjective:           Javier Gutierrez is a 83 y.o. male.    HPI   Presents today alongside daughter with concerns or blood in his urine. Endorses it is quite a bit of blood. Patient is currently on eliquis. Denies fevers or chills or dysuria. Due to limited capabilities through telehealth is it my recommendation that you go to the ER at this time for further evaluation and management.        Review of Systems   Respiratory: Negative.     Cardiovascular: Negative.    Genitourinary:  Positive for hematuria. Negative for decreased urine volume, dysuria, flank pain, penile discharge, penile pain, penile swelling, scrotal swelling, testicular pain and urgency.       .  Objective:          albuterol (VENTOLIN HFA) 90 mcg/actuation inhaler Inhale two puffs by mouth into the lungs every 6 hours as needed for Wheezing or Shortness of Breath. Shake well before use.    albuterol 0.083% (PROVENTIL) 2.5 mg /3 mL (0.083 %) nebulizer solution Inhale 3 mL solution by nebulizer as directed twice daily as needed.    apixaban (ELIQUIS) 2.5 mg tablet TAKE 1 TABLET BY MOUTH TWO TIMES A DAY    ascorbic acid (vitamin C) 500 mg tablet Take one tablet by mouth daily.    atorvastatin (LIPITOR) 20 mg tablet Take one tablet by mouth daily.    benzonatate (TESSALON) 200 mg capsule Take one capsule by mouth every 8 hours. Indications: cough    budesonide (PULMICORT) 0.5 mg/2 mL nebulizer solution Inhale 2 mL solution by nebulizer as directed twice daily.    CHOLEcalciferoL (vitamin D3) (DIALYVITE VITAMIN D) 125 mcg (5,000 unit) capsule Take one capsule by mouth daily.    clopiDOGrel (PLAVIX) 75 mg tablet Take one tablet by mouth daily.    dutasteride (AVODART) 0.5 mg PO capsule Take one capsule by mouth daily.    food supplemt, lactose-reduced (ENSURE PLUS) 0.05 gram- 1.5 kcal/mL oral liquid Take 1 Can by mouth twice daily with meals.    furosemide (LASIX) 20 mg tablet Take one tablet by mouth daily as needed.    guaiFENesin LA (MUCINEX) 600 mg tablet Take one tablet by mouth twice daily. Indications: cough    levothyroxine (SYNTHROID) 88 mcg tablet Take one tablet by mouth daily 30 minutes before breakfast.    metoprolol succinate XL (TOPROL XL) 25 mg extended release tablet TAKE 1 TABLET BY MOUTH TWO TIMES A DAY    montelukast (SINGULAIR) 10 mg tablet Take one tablet by mouth at bedtime daily. (Patient taking differently: Take one tablet by mouth daily.)    MYRBETRIQ 25 mg tablet Take one tablet by mouth daily.    other medication Prevagen Regular Strength - Take 1 capsule by mouth daily    pantoprazole DR (PROTONIX) 40 mg tablet Take one tablet by mouth daily.    zinc sulfate 220 mg (50 mg elemental zinc) capsule Take one capsule by mouth daily.         [] Patient unable to collect vitals      Computed Telehealth Body Mass Index unavailable. One or more values for this score either were not found within the given timeframe or did not fit some other criterion.    Physical Exam  Constitutional:       General: He is not in acute distress.  Appearance: He is normal weight. He is not ill-appearing, toxic-appearing or diaphoretic.   HENT:      Head: Normocephalic.      Mouth/Throat:      Mouth: Mucous membranes are moist.   Eyes:      Extraocular Movements: Extraocular movements intact.      Conjunctiva/sclera: Conjunctivae normal.      Pupils: Pupils are equal, round, and reactive to light.   Pulmonary:      Effort: Pulmonary effort is normal.   Neurological:      General: No focal deficit present.      Mental Status: He is alert. Mental status is at baseline.   Psychiatric:         Mood and Affect: Mood normal.         Behavior: Behavior normal.              Assessment and Plan:    1. Gross hematuria (Primary)    Due to limited capabilities through telehealth is it my recommendation that you go to the ER at this time for further evaluation and management.

## 2023-03-10 NOTE — ED Notes
 Pt arrives to ED today with his daughter who states he started having blood in his urine yesterday. He takes eliquis and Plavix for a stent and for a fib and she is concerned about his hemoglobin. He has never had this problem before. He denies any difficulty urinating or pain with urination. She has not seen any clots in his urine thus far. She called his doctor who asked that she hold his blood thinners this morning which she did. She states he has never had an enlarged prostate before. He is awake and alert, answers questions appropriately. Breathing is even and unlabored, skin PWD. Pt resting in cart.

## 2023-03-11 ENCOUNTER — Encounter: Admit: 2023-03-11 | Discharge: 2023-03-11 | Payer: MEDICARE

## 2023-03-11 MED ORDER — LACTATED RINGERS IV SOLP
1000 mL | INTRAVENOUS | 0 refills | Status: CP
Start: 2023-03-11 — End: ?
  Administered 2023-03-11: 18:00:00 1000 mL via INTRAVENOUS

## 2023-03-11 MED ORDER — METOPROLOL SUCCINATE 25 MG PO TB24
50 mg | Freq: Two times a day (BID) | ORAL | 0 refills | Status: DC
Start: 2023-03-11 — End: 2023-03-11

## 2023-03-11 MED ORDER — HYDRALAZINE 20 MG/ML IJ SOLN
10 mg | INTRAVENOUS | 0 refills | Status: DC | PRN
Start: 2023-03-11 — End: 2023-03-13
  Administered 2023-03-11: 12:00:00 10 mg via INTRAVENOUS

## 2023-03-11 MED ORDER — METOPROLOL SUCCINATE 25 MG PO TB24
25 mg | Freq: Two times a day (BID) | ORAL | 0 refills | Status: DC
Start: 2023-03-11 — End: 2023-03-13
  Administered 2023-03-12 (×2): 25 mg via ORAL

## 2023-03-11 MED ORDER — OXYCODONE 5 MG PO TAB
2.5 mg | ORAL | 0 refills | Status: DC | PRN
Start: 2023-03-11 — End: 2023-03-13

## 2023-03-11 NOTE — Progress Notes
 RT Adult Assessment Note    NAME:Javier Gutierrez             MRN: [id number]             DOB:02-07-1940          AGE: 83 y.o.  ADMISSION DATE: 03/10/2023             DAYS ADMITTED: LOS: 1 day    Additional Comments:  Impressions of the patient: The patient is resting in bed on RA, NAD at this time. He states he takes albuterol and pulmicort at home.   Intervention(s)/outcome(s): PAP BID, Albuterol PRN, Pulmicort BID    Patient education that was completed: NA  Recommendations to the care team: Follow RT protocol     Vital Signs:  Pulse: 103  RR: 16 PER MINUTE  SpO2: 92 %  O2 Device: None (Room air)  Liter Flow:    O2%:      Breath Sounds:   Right Apex Breath Sounds: Clear (Implies normal)  Right Base Breath Sounds: Decreased  Left Apex Breath Sounds: Clear (Implies normal)  Left Base Breath Sounds: Decreased  All Breath Sounds: Clear (Implies normal);Decreased  Respiratory Effort:   Respiratory WDL: Within Defined Limits  Respiratory Effort/Pattern: Unlabored  Comments:

## 2023-03-11 NOTE — Progress Notes
 I have reviewed the notes, assessment, and/or procedures performed by Manfred Shirts, RN and concur with her documentation unless otherwise noted.

## 2023-03-11 NOTE — Progress Notes
 Inpatient Progress Note    Name:  Javier Gutierrez   WGNFA'O Date:  03/11/2023  Admission Date: 03/10/2023  LOS: 1 day      Assessment/Plan:    DAMEON SOLTIS is a 83 y.o. male with past medical history of severe aortic stenosis s/p TAVR, history of CVA s/p left ICA stent in 2022, nonobstructive CAD, paroxysmal A-fib, CKD, PAD, and DLP presents to the ER due to dark urine. He for started noticing dark urine yesterday, daughter says it appeared as if it was concentrated urine yesterday but progressed to frank blood today.  Denies passing clots.  Patient denies any flank pain, suprapubic pain, abdominal pain, pain with urination.  No clots were seen past the urine.  He denies urinary urgency, hesitancy, dysuria, fever, chills.  He reports compliance with all of his medications including Eliquis, home health nurse instructed patient to hold morning dose of Eliquis today due to hematuria.     Left nephrolithiasis  -CT abdomen pelvis shows a 4 mm obstructing stone in the left atrophic kidney  -Patient has not been passing clots, reports no symptoms related to the stone, has not developed anemia due to hematuria  -Appreciate urology input, no plans for surgical intervention.  -Will continue IV fluids to aid in stone passage.  -Urology will arrange follow-up for him in 6 weeks to discuss further management on follow-up.     #Hypertensive urgency  -Developed while patient was in the ER.  Patient reports compliance with all of his medications  -Started on Cardene drip in the ER, will continue and try to wean off.  Target MAP initially 100-105  -Continue PTA metoprolol.  Will increase his metoprolol for better blood pressure control.     #Paroxysmal atrial fibrillation  -Continue PTA apixaban and Toprol for rate control     #PAD s/p left carotid stent  -Continue PTA Plavix and atorvastatin     #Hypothyroidism  -Continue PTA levothyroxine     Diet: Regular  DVT prophylaxis: On apixaban  Code status: Full Code Jolee Ewing MD  Internal Medicine  Med Private  Pager: [phone]      High medical decision making due to the following:  1 or more chronic illness with severe exacerbation and 1 acute or chronic illness that poses a threat to life or bodily function  decision regarding hospitalization                       Subjective    Patient is accompanied by his daughter.  She is concerned that patient has not slept well, and has been very tired.  Otherwise, he is hemodynamically stable, afebrile.  Will continue to monitor him closely.    Review of Systems:    Review of Systems   Constitutional: Negative for chills, fever, malaise, fatigue and weight loss.   Respiratory: Negative for Shortness of breath, cough, hemoptysis and sputum production.    Cardiovascular: Negative for chest pain, palpitations, orthopnea, leg swelling and claudication.   Gastrointestinal: Negative for blood in stool, constipation, heartburn, nausea and vomiting.   Genitourinary: Negative for dysuria, frequency and urgency.   Musculoskeletal: Negative for myalgias and neck pain.   Neurological: Negative for tremors, sensory change, speech change, focal weakness, seizures, loss of consciousness and headaches.   Psychiatric/Behavioral: Negative for hallucinations, substance abuse and suicidal ideas. The patient is not nervous/anxious.    All 12 systems otherwise negative.    Medications  Scheduled Meds:apixaban (ELIQUIS) tablet 2.5 mg,  2.5 mg, Oral, BID  atorvastatin (LIPITOR) tablet 20 mg, 20 mg, Oral, QDAY  budesonide (PULMICORT) nebulizer solution 0.5 mg, 0.5 mg, Inhalation, BID  clopiDOGreL (PLAVIX) tablet 75 mg, 75 mg, Oral, QDAY  dutasteride (AVODART) capsule 0.5 mg, 0.5 mg, Oral, QDAY  levothyroxine (SYNTHROID) tablet 88 mcg, 88 mcg, Oral, QDAY 30 min before breakfast  metoprolol succinate XL (TOPROL XL) tablet 25 mg, 25 mg, Oral, BID  mirabegron (MYRBETRIQ) ER tablet 25 mg, 25 mg, Oral, QDAY  montelukast (SINGULAIR) tablet 10 mg, 10 mg, Oral, QDAY  pantoprazole DR (PROTONIX) tablet 40 mg, 40 mg, Oral, QDAY    Continuous Infusions:   niCARdipine (cardENE) 20 mg/200 mL NS IV drip (std conc)(premade) Stopped (03/11/23 0402)     PRN and Respiratory Meds:acetaminophen Q6H PRN, albuterol sulfate Q6H PRN, hydrALAZINE Q2H PRN, melatonin QHS PRN, milk of magnesium Q6H PRN, ondansetron Q6H PRN **OR** ondansetron (ZOFRAN) IV Q6H PRN, oxyCODONE Q6H PRN      Objective:                          Vital Signs: Last Filed                 Vital Signs: 24 Hour Range   BP: 159/87 (02/17 0819)  Temp: 36.6 ?C (97.8 ?F) (02/17 1610)  Pulse: 97 (02/17 0819)  Respirations: 16 PER MINUTE (02/17 0819)  SpO2: 94 % (02/17 0819)  O2 Device: None (Room air) (02/17 0819) BP: (132-237)/(69-144)   Temp:  [36.3 ?C (97.3 ?F)-36.8 ?C (98.3 ?F)]   Pulse:  [76-111]   Respirations:  [16 PER MINUTE-18 PER MINUTE]   SpO2:  [92 %-100 %]   O2 Device: None (Room air)     Vitals:    03/10/23 1330 03/11/23 0500   Weight: (P) 74.8 kg (164 lb 12.8 oz) 72.6 kg (160 lb 0.9 oz)         Intake/Output Summary:  (Last 24 hours)    Intake/Output Summary (Last 24 hours) at 03/11/2023 0919  Last data filed at 03/11/2023 0900  Gross per 24 hour   Intake 200 ml   Output --   Net 200 ml             Vitals and nursing note reviewed.   Constitutional:       General: He is not in acute distress.  HENT:      Mouth/Throat:      Mouth: Mucous membranes are moist.   Cardiovascular:      Rate and Rhythm: Normal rate and regular rhythm.      Heart sounds: No murmur heard.  Pulmonary:      Effort: Pulmonary effort is normal.      Breath sounds: No wheezing.   Abdominal:      General: Abdomen is flat. Bowel sounds are normal.       Palpations: There is no mass.      Tenderness: There is no abdominal tenderness. There is no guarding.      Hernia: No hernia is present.   Musculoskeletal:      Right lower leg: Edema absent     Left lower leg: Edema absent  Skin:     General: Skin is warm.      Findings: No lesion or rash. Neurological:      General: No focal deficit present.      Mental Status: He is alert and oriented to person, place, and time. Mental status is  at baseline.      Cranial Nerves: No cranial nerve deficit.      Sensory: No sensory deficit.      Motor: No weakness.      Coordination: Coordination normal.     Lab Review  24-hour labs:    Results for orders placed or performed during the hospital encounter of 03/10/23 (from the past 24 hours)   URINALYSIS DIPSTICK REFLEX TO CULTURE    Collection Time: 03/10/23  2:01 PM    Specimen: Midstream; Urine   Result Value Ref Range    Color,UA Red     Turbidity,UA 2+ (A) Clear    Specific Gravity-Urine 1.021 1.005 - 1.030    pH,UA 6.0 5.0 - 8.0    Protein,UA 2+ (A) Negative    Glucose,UA Negative Negative    Ketones,UA Negative Negative    Bilirubin,UA Negative Negative    Blood,UA 3+ (A) Negative    Urobilinogen,UA Normal Normal    Nitrite,UA Negative Negative    Leukocytes,UA Negative Negative   URINALYSIS MICROSCOPIC REFLEX TO CULTURE    Collection Time: 03/10/23  2:01 PM    Specimen: Midstream; Urine   Result Value Ref Range    WBCs,UA 0 - 2 None, 0 - 2  /HPF    RBCs,UA Packed (A) None, 0 - 2  /HPF   CBC AND DIFF    Collection Time: 03/10/23  2:59 PM   Result Value Ref Range    White Blood Cells 10.50 4.50 - 11.00 10*3/uL    Red Blood Cells 5.17 4.40 - 5.50 10*6/uL    Hemoglobin 13.5 13.5 - 16.5 g/dL    Hematocrit 16.1 09.6 - 50.0 %    MCV 80.9 80.0 - 100.0 fL    MCH 26.2 26.0 - 34.0 pg    MCHC 32.3 32.0 - 36.0 g/dL    RDW 04.5 (H) 40.9 - 15.0 %    Platelet Count 229 150 - 400 10*3/uL    MPV 8.9 7.0 - 11.0 fL    Neutrophils 63 41 - 77 %    Lymphocytes 19 (L) 24 - 44 %    Monocytes 15 (H) 4 - 12 %    Eosinophils 2 0 - 5 %    Basophils 1 0 - 2 %    Absolute Neutrophil Count 6.60 1.80 - 7.00 10*3/uL    Absolute Lymph Count 2.00 1.00 - 4.80 10*3/uL    Absolute Monocyte Count 1.60 (H) 0.00 - 0.80 10*3/uL    Absolute Eosinophil Count 0.20 0.00 - 0.45 10*3/uL    Absolute Basophil Count 0.10 0.00 - 0.20 10*3/uL    MDW (Monocyte Distribution Width) 19.4 <=20.6   COMPREHENSIVE METABOLIC PANEL    Collection Time: 03/10/23  2:59 PM   Result Value Ref Range    Sodium 138 137 - 147 mmol/L    Potassium 4.4 3.5 - 5.1 mmol/L    Chloride 101 98 - 110 mmol/L    Glucose 89 70 - 100 mg/dL    Blood Urea Nitrogen 29 (H) 7 - 25 mg/dL    Creatinine 8.11 (H) 0.40 - 1.24 mg/dL    Calcium 9.1 8.5 - 91.4 mg/dL    Total Protein 6.7 6.0 - 8.0 g/dL    Total Bilirubin 0.6 0.2 - 1.3 mg/dL    Albumin 3.7 3.5 - 5.0 g/dL    Alk Phosphatase 66 25 - 110 U/L    AST 12 7 - 40 U/L    ALT 10 7 - 56 U/L  CO2 29 21 - 30 mmol/L    Anion Gap 8 3 - 12    Glomerular Filtration Rate (GFR) 45 (L) >60 mL/min   POC CREATININE    Collection Time: 03/10/23  3:01 PM   Result Value Ref Range    Creatinine, POC 1.5 (H) 0.4 - 1.24 mg/dL   CBC AND DIFF    Collection Time: 03/11/23  5:57 AM   Result Value Ref Range    White Blood Cells 16.60 (H) 4.50 - 11.00 10*3/uL    Red Blood Cells 5.32 4.40 - 5.50 10*6/uL    Hemoglobin 13.8 13.5 - 16.5 g/dL    Hematocrit 16.1 09.6 - 50.0 %    MCV 80.7 80.0 - 100.0 fL    MCH 26.0 26.0 - 34.0 pg    MCHC 32.2 32.0 - 36.0 g/dL    RDW 04.5 (H) 40.9 - 15.0 %    Platelet Count 223 150 - 400 10*3/uL    MPV 8.9 7.0 - 11.0 fL    Neutrophils 85 (H) 41 - 77 %    Lymphocytes 5 (L) 24 - 44 %    Monocytes 10 4 - 12 %    Eosinophils 0 0 - 5 %    Basophils 0 0 - 2 %    Absolute Neutrophil Count 14.10 (H) 1.80 - 7.00 10*3/uL    Absolute Lymph Count 0.80 (L) 1.00 - 4.80 10*3/uL    Absolute Monocyte Count 1.60 (H) 0.00 - 0.80 10*3/uL    Absolute Eosinophil Count 0.00 0.00 - 0.45 10*3/uL    Absolute Basophil Count 0.00 0.00 - 0.20 10*3/uL   COMPREHENSIVE METABOLIC PANEL    Collection Time: 03/11/23  5:57 AM   Result Value Ref Range    Sodium 139 137 - 147 mmol/L    Potassium 5.0 3.5 - 5.1 mmol/L    Chloride 101 98 - 110 mmol/L    Glucose 124 (H) 70 - 100 mg/dL    Blood Urea Nitrogen 27 (H) 7 - 25 mg/dL    Creatinine 8.11 (H) 0.40 - 1.24 mg/dL    Calcium 9.3 8.5 - 91.4 mg/dL    Total Protein 7.0 6.0 - 8.0 g/dL    Total Bilirubin 1.0 0.2 - 1.3 mg/dL    Albumin 3.7 3.5 - 5.0 g/dL    Alk Phosphatase 74 25 - 110 U/L    AST 12 7 - 40 U/L    ALT 7 7 - 56 U/L    CO2 26 21 - 30 mmol/L    Anion Gap 12 3 - 12    Glomerular Filtration Rate (GFR) 47 (L) >60 mL/min   MAGNESIUM    Collection Time: 03/11/23  5:57 AM   Result Value Ref Range    Magnesium 1.9 1.6 - 2.6 mg/dL   PHOSPHORUS    Collection Time: 03/11/23  5:57 AM   Result Value Ref Range    Phosphorus 2.9 2.0 - 4.5 mg/dL       Radiology and other Diagnostics Review:  Pertinent radiology reviewed.

## 2023-03-11 NOTE — Progress Notes
 BH51 JHFRAT NOTE    Admission Date: 03/10/2023  Length of Stay: LOS: 1 day      Fall Risk/JHFRAT Interventions and Education (charting when applicable)   a. Elimination Interventions: Commode at bedside and Place male/male urinal within reach    b. Medications: Bedside commode (i.e., urgency, frequency, dizziness) , Stay within arm's reach during toileting/showering (i.e., dizziness, orthostasis) , Educate patient on medication side effects, and Bed/chair alarm (i.e., change in mental status)    c. Patient Care Equipment: Needs assistance with patient care equipment when ambulating, Ensure environment is free of clutter and walkways are clear from tripping hazards, and Assess need for patient equipment and remove if not in use   d. Mobility: Assist x2, Gait belt in use when ambulating, Use of additional staff for handling patient equipment, Elimination equipment at bedside (urinal or commode) , and Ensure the use of corrective lens and/or hearing aides are in place prior to ambulation    e. Cognition: Bed/Chair Alarm , Stay within arm's reach while patient ambulating/toileting/showering, and Increase frequency of purposeful rounding   f. Risk for Moderate/Major Injury: Age: >65 yrs and Active Anticoagulation

## 2023-03-11 NOTE — Progress Notes
 [facility] Brief Note  03/11/2023     Patient: Javier Gutierrez  MRN: [id number]    Admission Date:  03/10/2023, LOS: 1 day  Admission Diagnosis: Hypertensive emergency [I16.1]  Date of Service: March 11, 2023    ASSESSMENT: 83 y.o. male with a 4mm left proximal ureteral calculi with adequate symptom control and without indication of concomitant urinary tract infection. Of note his LEFT kidney appears atrophic and was previously stented many years ago by urologists and his right (good kidney) is unobstructed but has a non-obstructive stone in pelvis. No concern for hematuria, hgb at appropriate level and no clots on CT.     PLAN:  - No acute urologic surgical intervention  - Ok for diet and to continue blood thinners  - Patient has elected for medical expulsive therapy after counseling regarding risks/benefits, will allow for 6 weeks of trial of passage.  - Stones may take several weeks to pass, recommend adequate prescription duration per below  - Recommend discharge with the following prescriptions while on medical expulsive therapy   - PO pain control (Norco or Percocet) 20 tabs as directed on label               - Increased oral hydration to aid in stone passage   - Senokot-S BID PRN while taking opioid pain medications to decrease constipation   - Zofran ODT Q6 hours PRN for nausea   - Patient unable to tolerate Flomax due to orthostatics and NSAIDs due to CKD.  - Please provide patient with urine strainer, patient to strain all urine  - If stone passage detected, please collect stone sone that it may be sent for stone analysis  - Urology will arrange follow up appointment in 6 weeks with patient to assess passage of stone, discuss stone prevention, metabolic workup and repeat CT A/P stone protocol with CMP, patient also may benefit from RIGHT vs BILATERAL URS/LL pending the result of this current stone episode  - Urology will sign off    Lucie Leather, MD  Urology Resident  Please page Urology on call with any questions

## 2023-03-11 NOTE — Case Management (ED)
 Case Management Admission Assessment    NAME:Javier Gutierrez                          MRN: [id number]             DOB:[birth year]          AGE: 83 y.o.  ADMISSION DATE: 03/10/2023             DAYS ADMITTED: LOS: 1 day      Today?s Date: 03/11/2023    Source of Information: patient's daughter [name] at bedside and EMR       Plan  Plan: Case Management Assessment  This CM met with pt for assessment on this date.  Provided contact information and explanation of SW/NCM roles.  Reviewed [facility], Preparing for Discharge, and [facility] hand-outs.  Provided opportunity for questions and discussion. Pt/family encouraged to contact Case Management team with questions and concerns during hospitalization and until patient is able to transition back to the patient's primary care physician.  Patient was asleep so his daughter [name] answered admission questions.   Address listed is patient's home but patient has been living with [facility] for several years now. [name] lives at [phone] [address] [place]. Information below is based on [name]'s address.   Pt uses RW and shower chair at home. Pt also has home oxygen but [name] stated patient has not needed it.   Pt is mostly independent with ADLs. [name] assists with transportation, medications, and meal prep.   Pt has gone to [facility] and [facility] in the past. [name] stated they would be open to rehab if recommended by PT/OT.   Pt is current with [facility] [phone]/ fax [phone] and [name] would like this resumed at time of dc if patient goes home.   NCM faxed H&P and pended HH orders.   [name] denied concerns with taking patient home once medically stable.   At time of discharge, [name] can provide transportation.   NCM to continue to follow for discharge planning.     Patient Address/Phone  [address]  [place] [address]  [phone] (home)     Emergency Contact  Extended Emergency Contact Information  Primary Emergency Contact: Devers,[name]  Address: [phone] [address], [place] [address] [place]  Home Phone: [phone]  Mobile Phone: [phone]  Relation: Daughter  Preferred language: ENGLISH  Interpreter needed? No  Secondary Emergency Contact: Ketan, [place]  Home Phone: [phone]  Mobile Phone: [phone]  Relation: Son  Preferred language: ENGLISH  Interpreter needed? No    Forensic scientist: Yes, patient has a healthcare directive  Type of Healthcare Directive: Durable power of attorney for healthcare  Location of Healthcare Directive: Current and verified in document scanning system  Would patient like to fill out a (a new) Healthcare Directive?: N/A      Transportation  Does the Patient Need Case Management to Arrange Discharge Transport? (ex: facility, ambulance, wheelchair/stretcher, Medicaid, cab, other): No  Will the Patient Use Family Transport?: Yes  Transportation Name, Phone and Availability #1: Daughter [name] [phone]    Expected Discharge Date  03/12/2023     Living Situation Prior to Admission  Living Arrangements  Type of Residence: Home, with Home Health or other assistance  Living Arrangements: Children (Daughter)  Financial risk analyst / Tub: Psychologist, counselling  How many levels in the residence?: 1  Can patient live on one level if needed?: Yes  Does residence have entry and/or inside stairs?: No  Assistance needed prior to admit or anticipated on discharge: Yes  Who provides assistance or could if needed?: Daughter  Are they in good health?: Yes  Level of Function   Prior level of function: Independent  Cognitive Abilities   Cognitive Abilities: Alert and Oriented, Participates in decision making    Financial Resources  Coverage  Primary Insurance: Medicare  Secondary Insurance: No insurance  Additional Coverage: None  Medication Coverage    Medication Coverage: Medicare Part D  Have you experienced a noticeable increase in your copay costs recently?: No  Are current medications affordable?: Yes  Do You Use a Co-Pay Card or a Medication Assistance Program to Help Manage Medication Costs?: No  Do You Manage Your Own Medications?: No  Who is Responsible for Ordering and Setting up Medications?: Daughter  Source of Income   Source Of Income: SSI  Financial Assistance Needed?  Medications are affordable at this time.     Psychosocial Needs  Mental Health  Mental Health History: No  Substance Use History  Substance Use History Screen: No  Other  N/a    Current/Previous Services  PCP  Lona Kettle, [phone], [phone]  Patient confirmed they are current with the above PCP.   [name] requested patient have referral sent to [facility] gerontology. NCM informed MPY team.   Pharmacy    [facility] [place], [place] - [address]  [phone]  [place] [address]  Phone: [phone] Fax: [phone]    Durable Medical Equipment   Durable Medical Equipment at home: Shower Chair, Bernardo Heater, Oxygen  Home Health  Receiving home health: Yes  Agency name: [facility]  Would patient use this agency again?: Yes  Hemodialysis or Peritoneal Dialysis  Undergoing hemodialysis or peritoneal dialysis: No  Tube/Enteral Feeds  Receive tube/enteral feeds: In the past  Infusion  Receive infusions: No  Private Duty  Private duty help used: No  Home and Community Based Services  Home and community based services: No  [name]  [name]: N/A  Hospice  Hospice: No  Outpatient Therapy  PT: No  OT: No  SLP: No  Skilled Nursing Facility/Nursing Home  SNF: In the past  Name of Facility: [facility]  Would patient return for future services?: Yes  NH: No  Inpatient Rehab  IPR: In the past  Name of Facility: [facility]  Would patient return for future services?: Yes  Long-Term Acute Care Hospital  LTACH: No  Acute Hospital Stay  Acute Hospital Stay: In the past  Was patient's stay within the last 30 days?: No      Riley Lam, BSN, RN   Nurse Case Manager [phone]  Available on [facility]

## 2023-03-11 NOTE — Progress Notes
 BH51 JHFRAT NOTE    Admission Date: 03/10/2023  Length of Stay: LOS: 1 day      Fall Risk/JHFRAT Interventions and Education (charting when applicable)   a. Elimination Interventions: Commode at bedside and Place male/male urinal within reach    b. Medications: Bedside commode (i.e., urgency, frequency, dizziness) , Use of gait belt , Stay within arm's reach during toileting/showering (i.e., dizziness, orthostasis) , Educate patient on medication side effects, Bed/chair alarm (i.e., change in mental status) , Consult the provider to decrease polypharmacy and unused medications , and Consult pharmacy for medication changes to decrease polypharmacy or high-risk medication use    c. Patient Care Equipment: Needs assistance with patient care equipment when ambulating, Ensure environment is free of clutter and walkways are clear from tripping hazards, and Assess need for patient equipment and remove if not in use   d. Mobility: Assist x2, Gait belt in use when ambulating, Use of additional staff for handling patient equipment, and Elimination equipment at bedside (urinal or commode)    e. Cognition: Bed/Chair Alarm , Stay within arm's reach while patient ambulating/toileting/showering, and Increase frequency of purposeful rounding   f. Risk for Moderate/Major Injury: Age: >65 yrs and Active Anticoagulation

## 2023-03-12 ENCOUNTER — Encounter: Admit: 2023-03-12 | Discharge: 2023-03-12 | Payer: MEDICARE

## 2023-03-12 DIAGNOSIS — N132 Hydronephrosis with renal and ureteral calculous obstruction: Secondary | ICD-10-CM

## 2023-03-12 DIAGNOSIS — E039 Hypothyroidism, unspecified: Secondary | ICD-10-CM

## 2023-03-12 DIAGNOSIS — Z8673 Personal history of transient ischemic attack (TIA), and cerebral infarction without residual deficits: Secondary | ICD-10-CM

## 2023-03-12 DIAGNOSIS — I480 Paroxysmal atrial fibrillation: Secondary | ICD-10-CM

## 2023-03-12 DIAGNOSIS — N400 Enlarged prostate without lower urinary tract symptoms: Secondary | ICD-10-CM

## 2023-03-12 DIAGNOSIS — Z87891 Personal history of nicotine dependence: Secondary | ICD-10-CM

## 2023-03-12 DIAGNOSIS — I130 Hypertensive heart and chronic kidney disease with heart failure and stage 1 through stage 4 chronic kidney disease, or unspecified chronic kidney disease: Secondary | ICD-10-CM

## 2023-03-12 DIAGNOSIS — F0390 Unspecified dementia without behavioral disturbance: Secondary | ICD-10-CM

## 2023-03-12 DIAGNOSIS — Z7901 Long term (current) use of anticoagulants: Secondary | ICD-10-CM

## 2023-03-12 DIAGNOSIS — I701 Atherosclerosis of renal artery: Secondary | ICD-10-CM

## 2023-03-12 DIAGNOSIS — N183 Chronic kidney disease, stage 3 unspecified (HCC): Secondary | ICD-10-CM

## 2023-03-12 DIAGNOSIS — D72829 Elevated white blood cell count, unspecified: Secondary | ICD-10-CM

## 2023-03-12 DIAGNOSIS — Z860100 Personal history of colon polyps, unspecified: Secondary | ICD-10-CM

## 2023-03-12 DIAGNOSIS — I739 Peripheral vascular disease, unspecified: Secondary | ICD-10-CM

## 2023-03-12 DIAGNOSIS — Z952 Presence of prosthetic heart valve: Secondary | ICD-10-CM

## 2023-03-12 DIAGNOSIS — J4489 Other specified chronic obstructive pulmonary disease (HCC): Secondary | ICD-10-CM

## 2023-03-12 DIAGNOSIS — Z7951 Long term (current) use of inhaled steroids: Secondary | ICD-10-CM

## 2023-03-12 DIAGNOSIS — R310 Gross hematuria: Secondary | ICD-10-CM

## 2023-03-12 DIAGNOSIS — Z800 Family history of malignant neoplasm of digestive organs: Secondary | ICD-10-CM

## 2023-03-12 DIAGNOSIS — Z7902 Long term (current) use of antithrombotics/antiplatelets: Secondary | ICD-10-CM

## 2023-03-12 DIAGNOSIS — I444 Left anterior fascicular block: Secondary | ICD-10-CM

## 2023-03-12 DIAGNOSIS — Z823 Family history of stroke: Secondary | ICD-10-CM

## 2023-03-12 DIAGNOSIS — Z825 Family history of asthma and other chronic lower respiratory diseases: Secondary | ICD-10-CM

## 2023-03-12 DIAGNOSIS — Z79890 Hormone replacement therapy (postmenopausal): Secondary | ICD-10-CM

## 2023-03-12 DIAGNOSIS — Z79899 Other long term (current) drug therapy: Secondary | ICD-10-CM

## 2023-03-12 DIAGNOSIS — I5022 Chronic systolic (congestive) heart failure: Secondary | ICD-10-CM

## 2023-03-12 DIAGNOSIS — I2510 Atherosclerotic heart disease of native coronary artery without angina pectoris: Secondary | ICD-10-CM

## 2023-03-12 DIAGNOSIS — N261 Atrophy of kidney (terminal): Secondary | ICD-10-CM

## 2023-03-12 DIAGNOSIS — I7143 Infrarenal abdominal aortic aneurysm, without rupture (HCC): Secondary | ICD-10-CM

## 2023-03-12 DIAGNOSIS — I350 Nonrheumatic aortic (valve) stenosis: Secondary | ICD-10-CM

## 2023-03-12 DIAGNOSIS — I429 Cardiomyopathy, unspecified: Secondary | ICD-10-CM

## 2023-03-12 DIAGNOSIS — E785 Hyperlipidemia, unspecified: Secondary | ICD-10-CM

## 2023-03-12 MED ORDER — AMOXICILLIN-POT CLAVULANATE 875-125 MG PO TAB
1 | ORAL_TABLET | Freq: Two times a day (BID) | ORAL | 0 refills | 7.00000 days | Status: AC
Start: 2023-03-12 — End: ?
  Filled 2023-03-12: qty 10, 5d supply, fill #1

## 2023-03-12 NOTE — Progress Notes
 BH51 JHFRAT NOTE    Admission Date: 03/10/2023  Length of Stay: LOS: 2 days      Fall Risk/JHFRAT Interventions and Education (charting when applicable)   a. Elimination Interventions: Commode at bedside   b. Medications: Use of gait belt , Stay within arm's reach during toileting/showering (i.e., dizziness, orthostasis) , and Bed/chair alarm (i.e., change in mental status)    c. Patient Care Equipment: Needs assistance with patient care equipment when ambulating and Ensure environment is free of clutter and walkways are clear from tripping hazards   d. Mobility: Assist x2, Gait belt in use when ambulating, Use of additional staff for handling patient equipment, and Utilize walker, cane, or additional walking aid for ambulation   e. Cognition: Bed/Chair Alarm  and Stay within arm's reach while patient ambulating/toileting/showering   f. Risk for Moderate/Major Injury: Age: >65 yrs and Risk for fracture

## 2023-03-12 NOTE — Progress Notes
 I have reviewed the notes, assessment, and/or procedures performed by Manfred Shirts, RN and concur with her documentation unless otherwise noted.

## 2023-03-12 NOTE — Progress Notes
 JOCSAN MCGINLEY Piascik discharged on 03/12/2023.   [name] Kitchen  Discharge instructions reviewed with patient and family.  Valuables returned: All items returned to pt  ADL Belongings at Bedside: Denture(s)  Denture(s): Full upper, Full lower.  Home medications:    .  Functional assessment at discharge complete: Yes .

## 2023-03-12 NOTE — Care Plan
 Problem: Discharge Planning  Goal: Participation in plan of care  Outcome: Goal Achieved  Goal: Knowledge regarding plan of care  Outcome: Goal Achieved  Goal: Prepared for discharge  Outcome: Goal Achieved     Problem: Skin Integrity  Goal: Skin integrity intact  Outcome: Goal Achieved  Goal: Healing of skin (Wound & Incision)  Outcome: Goal Achieved  Goal: Healing of skin (Pressure Injury)  Outcome: Goal Achieved

## 2023-03-12 NOTE — Progress Notes
 PHYSICAL THERAPY  ASSESSMENT      Name: Javier Gutierrez   MRN: [id number]     DOB: October 19, 1940      Age: 83 y.o.  Admission Date: 03/10/2023     LOS: 2 days     Date of Service: 03/12/2023      Mobility  Patient Turn/Position: Chair  Mobility Level [facility] Highest Level of Mobility (JH-HLM): Walk 25 feet or more (to doorway/hallway)  Distance Walked (feet): 200 ft  Level of Assistance: Stand by assistance  Assistive Device: Walker  Activity Limited By: Fatigue    Subjective  Reason for Admission and Past Medical Hx: 83 y.o. male with past medical history of severe aortic stenosis s/p TAVR, history of CVA s/p left ICA stent in 2022, nonobstructive CAD, paroxysmal A-fib, CKD, PAD, and DLP presents to the ER due to dark urine.  Special Considerations: Hearing impaired  Mental / Cognitive: Alert;Oriented;Cooperative;Follows commands  Pain: No complaint of pain  Persons Present: Occupational Therapist;Daughter    Home Living Situation  Lives With: Family;Has 24/7 assistance available;Receives assistance from family  Type of Home: House  Entry Stairs: No stairs  In-Home Stairs: No stairs  Bathroom Setup: Walk in shower  Patient Owned Equipment: Bathing: Paediatric nurse;Toilet: Grab bars;Walker with wheels;Wheelchair    Prior Level of Function  Level Of Independence: Independent with ADL and household mobility with device  Required Assist For: Bathing  History of Falls in Past 3 Months: No    ROM  ROM Position Assessed: Seated  R UE ROM: WFL  L UE ROM: WFL  R LE ROM: WFL  L LE ROM: WFL    Strength  Overall Strength: Generalized weakness;WFL;No focal deficits noted    Bed Mobility/Transfer  Bed Mobility: Supine to Sit: Standby Assist;Head of Bed Elevated;Use of Rail  Transfer Type: Sit to/from Stand  Transfer: Assistance Level: From;Bed;To;Bed Side Chair;Standby Assist  Transfer: Assistive Device: Nurse, adult  Transfers: Type Of Assistance: Materials engineer;For Safety Considerations  End Of Activity Status: Up in Chair;Nursing Notified;Instructed Patient to Request Assist with Mobility;Instructed Patient to Use Call Light  Comments: Patient able to don/doff socks with SBA for safety. Cues for hand placment with transfers for safety    Balance  Sitting Balance: Static Sitting Balance;2 UE Support;Standby Assist  Standing Balance: Static Standing Balance;2 UE support;Standby Assist;Dynamic Standing Balance;Minimal Assist    Gait  Gait Distance: 200 feet  Gait: Assistance Level: Standby Assist;Safety Considerations (CGA occasionally)  Gait: Assistive Device: Nurse, adult  Gait: Descriptors: Forward trunk flexion;Pace: Slow;Swing-Through Gait;No balance loss;Variable step length  Comments: Patient requires cues for upright posture and to keep walker nearer to body with gait. Daughter reports patient is near baseline  Activity Limited By: Complaint of Fatigue    Education  Persons Educated: Patient/Family  Interventions: Repetition of Instructions  Teaching Methods: Verbal Instruction  Patient Response: Verbalized Understanding  Topics: Plan/Goals of PT Interventions;Use of Assistive Device/Orthosis;Mobility Progression;Up with Assist Only;Safety Awareness;Importance of Increasing Activity;Ambulate With Nursing;Recommend Continued Therapy    Assessment/Progress  Impaired Mobility Due To: Deconditioning;Impaired Balance  Assessment/Progress: Should Improve w/ Continued PT  Comments: Patient tolerates session well. Patient is limited with mobility secondary to deconditioning and balance deficits. Recommend continued skilled physical therapy to maximize independence and safety with functional mobility    AM-PAC 6 Clicks Basic Mobility Inpatient  Turning from your back to your side while in a flat bed without using bed rails: None  Moving from lying on your back to sitting on  the side of a flat bed without using bedrails : A Little  Moving to and from a bed to a chair (including a wheelchair): A Little  Standing up from a chair using your arms (e.g. wheelchair, or bedside chair): A Little  To walk in hospital room: A Little  Climbing 3-5 steps with a railing: A Lot  Basic Mobility Inpatient Raw Score: 18  Standardized (T-scale) Score: 41.05  AM-PAC Basic Mobility Functional Stage: 34-51 Limited Mobility Indoors  Mobility Goal [facility]: JH-HLM 6 Walk >= 10 steps    Goals  Goal Formulation: With Patient/Family  Time For Goal Achievement: 1 day, To, 2 days  Patient Will Go Supine To/From Sit: Independently  Patient Will Transfer Sit to Stand: Independently  Patient Will Ambulate: Greater than 200 Feet, w/ Walker, w/ Stand By Assist    Plan  Treatment Interventions: Strengthening;Mobility training;Balance activities  Plan Frequency: 5 Days per Week  PT Plan for Next Visit: progress ambulation, balance activities, bed mobility with bed flat    PT Discharge Recommendations  Recommendation: Home with consistent supervision/assistance  Recommendation for Therapy Post Discharge: Home health  Patient Currently Requires Equipment: Owns what is needed      Therapist  Leavy Cella, PT, DPT  Date  03/12/2023

## 2023-03-12 NOTE — Progress Notes
 Pharmacy High Risk Medication Review    A high risk medication review has been performed for Justice Rocher by a pharmacy team member.      Medication class(es) to review on inpatient med list:  Opioids      The medications class(es) are noted to be high risk to mobility or mentation in patients >84 years of age. If your patient has been CAM positive (diagnosed with delirium) at any point during this hospitalization then it would be particularly helpful to review these medications. Each medication has its own unique risks and benefits to the patient, we ask that these medications are reviewed and considered for de-prescribing where appropriate.     Consider reviewing [email or web address] for anticholinergic burden calculation.  If you need further assistance identifying or de-prescribing, please contact pharmacy.      Current Inpatient Medications:  Scheduled Meds:apixaban (ELIQUIS) tablet 2.5 mg, 2.5 mg, Oral, BID  atorvastatin (LIPITOR) tablet 20 mg, 20 mg, Oral, QDAY  budesonide (PULMICORT) nebulizer solution 0.5 mg, 0.5 mg, Inhalation, BID  clopiDOGreL (PLAVIX) tablet 75 mg, 75 mg, Oral, QDAY  dutasteride (AVODART) capsule 0.5 mg, 0.5 mg, Oral, QDAY  levothyroxine (SYNTHROID) tablet 88 mcg, 88 mcg, Oral, QDAY 30 min before breakfast  metoprolol succinate XL (TOPROL XL) tablet 25 mg, 25 mg, Oral, BID  mirabegron (MYRBETRIQ) ER tablet 25 mg, 25 mg, Oral, QDAY  montelukast (SINGULAIR) tablet 10 mg, 10 mg, Oral, QDAY  pantoprazole DR (PROTONIX) tablet 40 mg, 40 mg, Oral, QDAY    Continuous Infusions:  PRN and Respiratory Meds:acetaminophen Q6H PRN, albuterol sulfate Q6H PRN, hydrALAZINE Q2H PRN, melatonin QHS PRN, milk of magnesium Q6H PRN, ondansetron Q6H PRN **OR** ondansetron (ZOFRAN) IV Q6H PRN, oxyCODONE Q6H PRN        Thank you,  Aline August, [facility]  03/12/2023

## 2023-03-12 NOTE — Progress Notes
 OCCUPATIONAL THERAPY  ASSESSMENT/DISCHARGE NOTE      Name: Javier Gutierrez   MRN: [id number]     DOB: [birth year]      Age: 83 y.o.  Admission Date: 03/10/2023     LOS: 2 days     Date of Service: 03/12/2023      Mobility  Patient Turn/Position: Chair  Mobility Level [facility] Highest Level of Mobility (JH-HLM): Walk 25 feet or more (to doorway/hallway)  Distance Walked (feet): 200 ft  Level of Assistance: Stand by assistance  Assistive Device: Walker  Activity Limited By: Fatigue    Subjective  Reason for Admission and Past Medical Hx: 83 y.o. male with past medical history of severe aortic stenosis s/p TAVR, history of CVA s/p left ICA stent in 2022, nonobstructive CAD, paroxysmal A-fib, CKD, PAD, and DLP presents to the ER due to dark urine.  Special Considerations: Hearing impaired  Mental / Cognitive: Alert;Oriented;Cooperative;Follows commands  Pain: No complaint of pain  Persons Present: Physical Therapist;Daughter    Home Living Situation  Lives With: Family;Has 24/7 assistance available;Receives assistance from family  Type of Home: House  Entry Stairs: No stairs  In-Home Stairs: No stairs  Bathroom Setup: Walk in shower  Patient Owned Equipment: Bathing: Paediatric nurse;Toilet: Grab bars;Walker with wheels;Wheelchair    Prior Level of Function  Level Of Independence: Independent with ADL and household mobility with device  Required Assist For: Bathing;Transfers;All home functioning ADL  History of Falls in Past 3 Months: No  Comments: family provides supervision/assist with shower transfers.    ADL's  Where Assessed: Chair  LE Dressing Assist: Stand By Assist  LE Dressing Deficits: Don/Doff R Sock;Don/Doff L Sock  Comment: patient declines all other ADLs this date.    ADL Mobility  Bed Mobility: Supine to Sit: Standby assist  Transfer Type: Sit to/from stand  Transfer: Assistance Level: To/from;Bed;Standby assist;Verbal cues  Transfer: Assistive Device: Agricultural consultant: Type of Assistance: For safety considerations  End of Activity Status: Up in chair;Instructed patient to request assist with mobility;Instructed patient to use call light;Nursing notified (chair alarm on, all needs met)  Transfer Comments: verbal cueing for hand placement during transfers  Sitting Balance: No UE support;Standby assist  Standing Balance: 2 UE support;Standby assist  Gait Distance: 200 feet  Gait: Assistance Level: Standby assist  Gait: Assistive Device: Roller walker    Activity Tolerance  Endurance: 3/5 Tolerates 25-30 Minutes Exercise w/Multiple Rests    Cognition  Social Interaction: Interacts in a Network engineer  Attention: Awake/Alert    ROM  R UE ROM: WFL  L UE ROM: WFL  R LE ROM: WFL  L LE ROM: WFL  Coordination: Adequate to Complete ADLs  Grasp: Bilateral Grasp Functional for Activity    UE Strength / Tone  R UE Strength: WFL  L UE Strength: WFL    Education  Persons Educated: Patient/Family  Interventions: Repetition of Instructions  Teaching Methods: Verbal Instruction  Patient Response: Verbalized Understanding  Topics: Role of OT, Goals for Therapy;Home safety  Goal Formulation: With Patient/Family    Assessment  Assessment: Decreased High-Level ADLs;Decreased Self-Care Trans;Decreased ADL Status;Decreased Endurance  Prognosis: Good;w/ Family  Goal Formulation: Patient  Comment: patient's current level of function suggests that patient will progress with one discipline. OT will sign off at this time. Please re-consult if a change in functional status occurs.    AM-PAC 6 Clicks Daily Activity Inpatient  Putting on and taking off regular lower body clothes: A Little  Bathing (Including washing, rinsing, drying): A Little  Toileting, which includes using toilet, bedpan, or urinal: A Little  Putting on and taking off regular upper body clothing: None  Taking care of personal grooming such as brushing teeth: None  Eating meals: None  Daily Activity Raw Score: 21  Standardized (T-scale) Score: 44.27    Plan  Progress: Discontinue OT  OT Frequency: No Further Treatment    OT Discharge Recommendations  Recommendation: Home with consistent supervision/assistance  Recommendation for Therapy Post Discharge: Home health    Therapist: Chari Manning, OTD, OTR/L  Date: 03/12/2023

## 2023-03-12 NOTE — Discharge Instructions - Pharmacy
 Discharge Summary      Name: Javier Gutierrez  Medical Record Number: [id number]        Account Number:  [id number]  Date Of Birth:  01-05-1941                         Age:  83 y.o.  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[facility])    Hypertension    Hyperlipidemia    Bilateral carotid artery disease (HCC)    Left anterior fascicular block    CAD (coronary artery disease)    Stage 3 chronic kidney disease (HCC)    S/P TAVR (transcatheter aortic valve replacement)    Hematuria    Nephrolith    Hydronephrosis of left kidney     Present on Admission:   Hypertensive emergency   Cardiomyopathy [facility])   History of renal artery stenosis   Cerebral embolism with cerebral infarction (HCC)   Hypertension   Hyperlipidemia   Bilateral carotid artery disease (HCC)   Left anterior fascicular block   CAD (coronary artery disease)   Stage 3 chronic kidney disease (HCC)   S/P TAVR (transcatheter aortic valve replacement)        Significant Past Medical History        Acute on chronic systolic heart failure, NYHA class 2 (HCC)  Amyloidosis (HCC)  Aortic valve stenosis, mild  Arrhythmia      Comment:  new onset afib-sched for cardioversion 05/082024  Arterial occlusion      Comment:  left kidney-two stents placed  Arthritis      Comment:  lower back  Asthma  CAD (coronary artery disease)  Cardiomyopathy (HCC)  Carotid artery plaque  CHF (congestive heart failure) (HCC)  Colon polyps  COPD (chronic obstructive pulmonary disease) (HCC)  Coronary atherosclerosis  Dizziness  Dyspnea  Frailty  H/O: CVA (cardiovascular accident)  History of renal artery stenosis  HTN  Hyperlipidemia  Hyperlipidemia  Hypertension  Hypothyroidism  Kidney disease  Lung disease  Peripheral vascular disease (HCC)  Tobacco abuse  Valvular heart disease    Allergies   Patient has no known allergies.    Brief Hospital Course     Mr., Lefkowitz is a 83 y.o. male with past medical history of severe aortic stenosis s/p TAVR, history of CVA s/p left ICA stent in 2022, nonobstructive CAD, paroxysmal A-fib, CKD, PAD, and DLP presents to the ER due to dark urine. He for started noticing dark urine, daughter says it appeared as if it was concentrated urine but progressed to frank blood today.  Denies passing clots.  Patient denies any flank pain, suprapubic pain, abdominal pain, pain with urination.  No clots were seen past the urine.  He denies urinary urgency, hesitancy, dysuria, fever, chills.  He reports compliance with all of his medications including  Eliquis, home health nurse instructed patient to hold morning dose of Eliquis due to hematuria.   On admission, he was afebrile, and hypertensive.  CT abdomen pelvis was done which showed 4 mm obstructing stone in the left atrophic kidney.  Urology was consulted, and patient decided for medical expulsive therapy after counseling risk/benefit.  Urinalysis was not concerning for UTI.  Patient was admitted for observation.  His symptoms improved.  Urology will arrange follow-up in 6 weeks to discuss further management to assess passage of stone, discuss stone prevention, metabolic workup and any repeat imaging if indicated, patient also may benefit from RIGHT vs BILATERAL URS/LL pending the result of this current stone episode.    During his hospital stay, his blood pressure improved.  He was continued on his PTA medication.  It was likely that pain was contributing to his uncontrolled blood pressure.  Patient lives with his family including his daughter.  They are very involved in his care.  They will continue to check his blood pressure at home, and by home health.    Patient had chest x-ray done which showed findings supportive of chronic aspiration.  He has small amount of cough, given his leukocytosis, it was decided to discharge him for short course of Augmentin.  Strict return precautions were discussed with the patient and family.    Patient was evaluated by PT, and OT.  He will discharge home with home health.       Day of discharge exam notable for:  Constitutional:       General: He is not in acute distress.  HENT:      Mouth/Throat:      Mouth: Mucous membranes are moist.   Cardiovascular:      Rate and Rhythm: Normal rate and regular rhythm.      Heart sounds: No murmur heard.  Pulmonary:      Effort: Pulmonary effort is normal.      Breath sounds: No wheezing.   Abdominal:      General: Abdomen is flat. Bowel sounds are normal.       Palpations: There is no mass.      Tenderness: There is no abdominal tenderness. There is no guarding.      Hernia: No hernia is present.   Musculoskeletal:      Right lower leg: Edema absent     Left lower leg: Edema absent  Skin:     General: Skin is warm.      Findings: No lesion or rash.   Neurological:      General: No focal deficit present.      Mental Status: He is alert and oriented to person, place, and time. Mental status is at baseline.      Cranial Nerves: No cranial nerve deficit.      Sensory: No sensory deficit.      Motor: No weakness.      Coordination: Coordination normal.     Items Needing Follow Up   Pending items or areas that need to be addressed at follow up:   Follow-up CBC, and CMP on Friday.  Follow-up with primary care physician.  Referral to geriatric physician.    Pending Labs and Follow Up Radiology    Pending labs and/or radiology review at this time of discharge are listed below: Please note- any labs with collected status will not have a result; if this area is blank, there are no items for review.  Medications      Medication List      START taking these medications     amoxicillin-potassium clavulanate 875/125 mg tablet; Commonly known as:   AUGMENTIN; Dose: 1 tablet; Take one tablet by mouth twice daily with meals   for 5 days. Indications: a lower respiratory infection; For: a lower   respiratory infection; Quantity: 10 tablet; Refills: 0     CHANGE how you take these medications     montelukast 10 mg tablet; Commonly known as: SINGULAIR; Dose: 10 mg;   Take one tablet by mouth at bedtime daily.; Quantity: 30 tablet; Refills:   1; What changed: when to take this     CONTINUE taking these medications     * albuterol 0.083% 2.5 mg /3 mL (0.083 %) nebulizer solution; Commonly   known as: PROVENTIL; Dose: 3 mL; Refills: 0   * albuterol sulfate 90 mcg/actuation HFA aerosol inhaler; Commonly known   as: VENTOLIN HFA; Dose: 2 puff; Inhale two puffs by mouth into the lungs   every 6 hours as needed for Wheezing or Shortness of Breath. Shake well   before use.; Quantity: 2 Inhaler; Refills: 1   ascorbic acid (vitamin C) 500 mg tablet; Dose: 500 mg; Refills: 0   atorvastatin 20 mg tablet; Commonly known as: LIPITOR; Dose: 20 mg; Take   one tablet by mouth daily.; Quantity: 90 tablet; Refills: 3   benzonatate 200 mg capsule; Commonly known as: TESSALON; Dose: 200 mg;   Take one capsule by mouth every 8 hours. Indications: cough; For: cough;   Quantity: 20 capsule; Refills: 0   budesonide 0.5 mg/2 mL nebulizer solution; Commonly known as: PULMICORT;   Dose: 2 mL; Refills: 0   CHOLEcalciferoL (vitamin D3) 125 mcg (5,000 unit) capsule; Commonly   known as: DIALYVITE VITAMIN D; Dose: 5,000 Units; Refills: 0   clopiDOGreL 75 mg tablet; Commonly known as: PLAVIX; Dose: 75 mg; Take   one tablet by mouth daily.; Quantity: 30 tablet; Refills: 1   dutasteride 0.5 mg capsule; Commonly known as: AVODART; Dose: 0.5 mg;   Refills: 0   ELIQUIS 2.5 mg tablet; Generic drug: apixaban; Dose: 2.5 mg; TAKE 1 TABLET BY MOUTH TWO TIMES A DAY; Quantity: 180 tablet; Refills: 3   food supplemt, lactose-reduced 0.05 gram- 1.5 kcal/mL oral liquid;   Commonly known as: ENSURE PLUS; Dose: 1 Can; Refills: 0   furosemide 20 mg tablet; Commonly known as: LASIX; Dose: 20 mg; Take one   tablet by mouth daily as needed.; Quantity: 90 tablet; Refills: 0   guaiFENesin LA 600 mg tablet; Commonly known as: MUCINEX; Dose: 600 mg;   Take one tablet by mouth twice daily. Indications: cough; For: cough;   Quantity: 30 tablet; Refills: 0   levothyroxine 88 mcg tablet; Commonly known as: SYNTHROID; Dose: 88 mcg;   Refills: 0   metoprolol succinate XL 25 mg extended release tablet; Commonly known   as: TOPROL XL; Dose: 25 mg; TAKE 1 TABLET BY MOUTH TWO TIMES A DAY;   Quantity: 180 tablet; Refills: 3   MYRBETRIQ 25 mg ER tablet; Generic drug: mirabegron; Dose: 25 mg;   Refills: 0   other medication; Refills: 0   pantoprazole DR 40 mg tablet; Commonly known as: PROTONIX; Dose: 40 mg;   Take one tablet by mouth daily.; Quantity: 30 tablet; Refills: 1   zinc sulfate 220 mg (50 mg elemental zinc) capsule; Dose: 220 mg;   Refills: 0  * This list has 2 medication(s) that are  the same as other medications   prescribed for you. Read the directions carefully, and ask your doctor or   other care provider to review them with you.       Return Appointments and Scheduled Appointments     Scheduled appointments:      Mar 29, 2023 9:00 AM  Echo Doppler with [facility] ECHO  Cardiovascular Medicine: [facility] (CVM Procedural) [phone] Kathie Rhodes [address]  [address]  [place] [address]  [phone]     Apr 11, 2023 9:30 AM  Office visit with Dorris Fetch, MD  Cardiovascular Medicine: [facility] (CVM Exam) [address]  [address]  [place] [address]  [phone]          Contact information for after-discharge care                [facility]       [facility] [place]    Phone: [phone]    Fax: [phone]    Where: [address], [place] [address]    Service: Home Health Services                  Consults, Procedures, Diagnostics, Micro, Pathology   Consults: Urology  Surgical Procedures & Dates: None  Significant Diagnostic Studies, Micro and Procedures: none  Significant Pathology: none                       Discharge Disposition, Condition   Patient Disposition: Home or Self Care [01]  Condition at Discharge: Stable    Code Status   Full Code    Patient Instructions     Activity       Activity as Tolerated   As directed      It is important to keep increasing your activity level after you leave the hospital.  Moving around can help prevent blood clots, lung infection (pneumonia) and other problems.  Gradually increasing the number of times you are up moving around will help you return to your normal activity level more quickly.  Continue to increase the number of times you are up to the chair and walking daily to return to your normal activity level. Begin to work toward your normal activity level at discharge          Diet       Cardiac Diet   As directed      Limiting unhealthy fats and cholesterol is the most important step you can take in reducing your risk for cardiovascular disease.  Unhealthy fats include saturated and trans fats.  Monitor your sodium and cholesterol intake.  Restrict your sodium to 2g (grams) or 2000mg  (milligrams) daily, and your cholesterol to 200mg  daily.    If you have questions regarding your diet at home, you may contact a dietitian at [phone].             Discharge education provided to patient., Signs and Symptoms:   Report these signs and symptoms       Report These Signs and Symptoms   As directed      Please contact your doctor if you have any of the following symptoms: temperature higher than 100.4 degrees F, uncontrolled pain, persistent nausea and/or vomiting, difficulty breathing, chest pain, severe abdominal pain, headache, unable to urinate, unable to have bowel movement, or drainage with a foul odor        ,  and Education:     Additional Orders: Case Management, Supplies, Home Health     Home Health/DME                HOME HEALTH/DME  ONCE        Comments: Home Health/Durable Medical Equipment Order Details    Patient Name:  WANDA CELLUCCI                   Medical Record Number:   [id number]    Patient Diagnosis & ICD 10 Codes: Hypertensive emergency ICD-10-CM: I16.1, H/O: CVA (cardiovascular accident), Hypertension [I10], Hyperlipidemia [E78.5], CHF (congestive heart failure) (HCC) [I50.9],  COPD (chronic obstructive pulmonary disease) (HCC) [J44.9]     Patient Height: 177.8 cm (5' 10)   Patient Weight: 72.6 kg (160 lb 0.9 oz)    Provider Information:  MD Name: Jolee Ewing  MD Phone Number: [phone]  MD Fax Number: [phone]  MD NPI: [id number]      Agency Instructions:     Patient now stable for discharge. Please resume the following disciplines: SN. Please also add on PT, OT, and speech therapy.   RN to complete general assessment, including vitals with temperature, monitor and teach patient and/or caregiver medication management and compliance, monitor and teach pain management and pain medication compliance when necessary. Monitor and teach signs and symptoms of infection, monitor and teach disease management, including signs and symptoms of diet, who and when to call, hospital readmission avoidance. Please see attached AVS for additional discharge instruction.    Nursing to draw CBC and CMP on Friday 03/15/2023. Results can be faxed to PCP [facility] [phone].     Physical Therapy  to evaluate and treat along with home safety evaluation.  Teach strategies for energy conservation, mobility training, strengthening, balance activities and address deficits, maximize function and improve safety.    Occupational Therapy   to evaluate and treat along with home safety evaluation.  Teach strategies for energy conservation, mobility training, strengthening, balance activities and address deficits, maximize function and improve safety.    Speech therapy to evaluate and treat patient.    Clinical findings to support homebound status: Poor tolerance for activity  Clinical findings to support home care services: Deficit in ADL's    I certify that this patient is under my care and that I, or a nurse practitioner or physician's assistant working with me, had a face-to-face encounter that meets the physician's face-to-face encounter requirements with this patient on 03/12/2023.    This patient is under my care, and I have initiated the establishment of the plan of care.  This patient will be followed by a physician after discharge, who will periodically review the plan of care.   Question Answer Comment   Attending Name/Contact Jolee Ewing    PCP Name/Contact Lona Kettle Phone: [phone] Fax: [phone]    Home Health to Follow PCP                             Signed:  Jolee Ewing, MD  03/12/2023      cc:  Primary Care Physician:  Lona Kettle   Verified    Referring physicians:  No ref. provider found   Additional provider(s):        Did we miss something? If additional records are needed, please fax a request on office letterhead to [phone]. Please include the patient's name, date of birth, fax number and type of  information needed. Additional request can be made by email at [email or web address]. For general questions of information about electronic records sharing, call [phone].

## 2023-03-29 ENCOUNTER — Encounter: Admit: 2023-03-29 | Discharge: 2023-03-29 | Payer: MEDICARE

## 2023-03-29 ENCOUNTER — Ambulatory Visit: Admit: 2023-03-29 | Discharge: 2023-03-29 | Payer: MEDICARE

## 2023-04-01 ENCOUNTER — Encounter: Admit: 2023-04-01 | Discharge: 2023-04-01 | Payer: MEDICARE

## 2023-04-01 NOTE — Telephone Encounter
 Sent mychart message with results and recommendations.

## 2023-04-01 NOTE — Telephone Encounter
-----   Message from Selinda Flavin, MD sent at 03/30/2023  8:15 PM CST -----  Please call this patient and let him know that the heart function is mildly decreased, 45% just like before, in addition the previous bioprosthetic aortic valve functions normally, also let him know that the valve on the left side is just a little bit sclerotic antibiotic there is nothing significant that would require another heart procedure.    Thank you  ----- Message -----  From: Dorris Fetch, MD  Sent: 03/29/2023   3:18 PM CST  To: Dorris Fetch, MD

## 2023-04-10 ENCOUNTER — Encounter: Admit: 2023-04-10 | Discharge: 2023-04-10 | Payer: MEDICARE

## 2023-04-11 ENCOUNTER — Encounter: Admit: 2023-04-11 | Discharge: 2023-04-11 | Payer: MEDICARE

## 2023-04-11 ENCOUNTER — Ambulatory Visit: Admit: 2023-04-11 | Discharge: 2023-04-12 | Payer: MEDICARE

## 2023-04-11 DIAGNOSIS — I6312 Cerebral infarction due to embolism of basilar artery: Secondary | ICD-10-CM

## 2023-04-11 DIAGNOSIS — F17200 Nicotine dependence, unspecified, uncomplicated: Secondary | ICD-10-CM

## 2023-04-11 DIAGNOSIS — I444 Left anterior fascicular block: Secondary | ICD-10-CM

## 2023-04-11 DIAGNOSIS — I6501 Occlusion and stenosis of right vertebral artery: Secondary | ICD-10-CM

## 2023-04-11 DIAGNOSIS — I350 Nonrheumatic aortic (valve) stenosis: Secondary | ICD-10-CM

## 2023-04-11 DIAGNOSIS — I739 Peripheral vascular disease, unspecified: Secondary | ICD-10-CM

## 2023-04-11 DIAGNOSIS — I2510 Atherosclerotic heart disease of native coronary artery without angina pectoris: Secondary | ICD-10-CM

## 2023-04-11 DIAGNOSIS — Z8679 Personal history of other diseases of the circulatory system: Secondary | ICD-10-CM

## 2023-04-11 DIAGNOSIS — I10 Essential (primary) hypertension: Secondary | ICD-10-CM

## 2023-04-11 DIAGNOSIS — I6523 Occlusion and stenosis of bilateral carotid arteries: Secondary | ICD-10-CM

## 2023-04-11 DIAGNOSIS — I951 Orthostatic hypotension: Secondary | ICD-10-CM

## 2023-04-11 DIAGNOSIS — Z8701 Personal history of pneumonia (recurrent): Secondary | ICD-10-CM

## 2023-04-11 DIAGNOSIS — I5032 Chronic diastolic (congestive) heart failure: Secondary | ICD-10-CM

## 2023-04-11 DIAGNOSIS — I358 Other nonrheumatic aortic valve disorders: Secondary | ICD-10-CM

## 2023-04-11 DIAGNOSIS — Z7409 Other reduced mobility: Secondary | ICD-10-CM

## 2023-04-11 DIAGNOSIS — Z86711 Personal history of pulmonary embolism: Secondary | ICD-10-CM

## 2023-04-11 DIAGNOSIS — N183 Stage 3 chronic kidney disease, unspecified whether stage 3a or 3b CKD (CMS-HCC): Secondary | ICD-10-CM

## 2023-04-11 DIAGNOSIS — I4729 NSVT (nonsustained ventricular tachycardia) (CMS-HCC): Secondary | ICD-10-CM

## 2023-04-11 DIAGNOSIS — Z952 Presence of prosthetic heart valve: Secondary | ICD-10-CM

## 2023-04-11 DIAGNOSIS — R54 Age-related physical debility: Secondary | ICD-10-CM

## 2023-04-11 DIAGNOSIS — I161 Hypertensive emergency: Secondary | ICD-10-CM

## 2023-04-11 DIAGNOSIS — I429 Cardiomyopathy, unspecified: Secondary | ICD-10-CM

## 2023-04-11 DIAGNOSIS — I2699 Other pulmonary embolism without acute cor pulmonale: Secondary | ICD-10-CM

## 2023-04-11 DIAGNOSIS — E7800 Pure hypercholesterolemia, unspecified: Secondary | ICD-10-CM

## 2023-04-11 DIAGNOSIS — Z9889 Other specified postprocedural states: Secondary | ICD-10-CM

## 2023-04-11 DIAGNOSIS — J9601 Acute respiratory failure with hypoxia: Secondary | ICD-10-CM

## 2023-04-11 DIAGNOSIS — U071 Pneumonia due to COVID-19 virus: Secondary | ICD-10-CM

## 2023-04-11 DIAGNOSIS — I5023 Acute on chronic systolic (congestive) heart failure: Secondary | ICD-10-CM

## 2023-04-11 MED ORDER — LISINOPRIL 2.5 MG PO TAB
2.5 mg | ORAL_TABLET | Freq: Every day | ORAL | 3 refills | Status: AC
Start: 2023-04-11 — End: ?

## 2023-04-11 NOTE — Patient Instructions
 Thank you for visiting our office today.    We would like to make the following medication adjustments:      Lisinopril 2.5mg  daily       Otherwise continue the same medications as you have been doing.          We will be pursuing the following tests after your appointment today:       Orders Placed This Encounter    25-OH VITAMIN D (D2 + D3)    lisinopriL (ZESTRIL) 2.5 mg tablet     Scheduling Number [phone]    We will plan to see you back in 6 months.  Please call us in the meantime with any questions or concerns.        Please allow 5-7 business days for our providers to review your results. All normal results will go to MyChart. If you do not have Mychart, it is strongly recommended to get this so you can easily view all your results. If you do not have mychart, we will attempt to call you once with normal lab and testing results. If we cannot reach you by phone with normal results, we will send you a letter.  If you have not heard the results of your testing after one week please give [place] a call.       Your Cardiovascular Medicine [place] Team Brett Canales, Pilar Jarvis, Shawna Orleans, and [place])  phone number is [phone].

## 2023-04-11 NOTE — Progress Notes
 Date of Service: 04/11/2023    Javier Gutierrez is a 83 y.o. male.       HPI      Javier Gutierrez is a 83 y.o. 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,, status post stent placement in July 2022, nonobstructive coronary artery disease, PAD (history of left renal artery stenosis and PTA in May 2003), history of aspiration pneumonia and hospitalization at [facility] in April 2023, orthostatic hypotension, overall generalized weakness and poor functional status due to physical deconditioning, advanced age and multiple comorbidities status post admission to [facility] between 05/28/2022 - 05/30/2022 with symptoms of acute hypoxic respiratory failure, s/p COVID- 19 viral infection in Nov 2024, renal stones, status post hospitalization at [facility] between 2/16 - 03/12/2023 for a hypertensive emergency and an obstructive renal stone in the left atrophic kidney.    Patient was valuated with a 2D echo Doppler study on 03/29/2023, it demonstrated LVEF = 45%, severe mitral annular calcification with mild stenosis, MG = 5 mmHg, normal TAVR, MG = 5 mmHg, it was no significant change compared to a previous study dated 05/23/2022.    It was also reported an increased blood pressure in the range of 190/90 mmHg the other day 1 physical therapy was supposed to work with him.  At present time patient is on metoprolol XL 25 mg p.o. twice daily.  He does have a history of hypotension and he is not on any other antihypertensive/heart failure medication.      Patient is in a wheelchair today.  He is accompanied at his office visit by his daughter.  He has some decreased appetite and lost approximately 5 pounds from a previous recorded weight on 03/10/2023.         Vitals:    04/11/23 0928   BP: (!) 144/76   BP Source: Arm, Left Upper   Pulse: 78   SpO2: 95%   O2 Device: None (Room air)   PainSc: Zero   Weight: 72.9 kg (160 lb 12.8 oz)  Comment: pts daughter stated weight   Height: 177.8 cm (5' 10)     Body mass index is 23.07 kg/m?[name] Kitchen     Past Medical History  Patient Active Problem List    Diagnosis Date Noted    Hypertensive emergency 03/10/2023    Hematuria 03/10/2023    Nephrolith 03/10/2023    Hydronephrosis of left kidney 03/10/2023    Pneumonia due to COVID-19 virus 11/24/2022    Acute hypoxemic respiratory failure (CMS-HCC) 05/28/2022    Hospital discharge follow-up 05/30/2021    Aspiration pneumonia (CMS-HCC) 05/20/2021    Malaise and fatigue 05/18/2021    NSVT (nonsustained ventricular tachycardia) (HCC) 04/21/2020    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    Weight gain 07/02/2019    COVID-19 vaccine administered 07/02/2019    Orthostatic hypotension 07/02/2019    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    History of left-sided carotid endarterectomy 06/06/2018    Mild persistent asthma without complication 05/21/2018    Acute blood loss as cause of postoperative anemia 02/24/2018    Dysarthria 02/21/2018    Decreased functional mobility and endurance 02/21/2018    Impaired mobility and ADLs 02/21/2018    Facial droop 02/21/2018    Left sided lacunar stroke (CMS-HCC) 02/21/2018    History of pneumonia 01/24/2017  History of pulmonary embolus (PE) 01/24/2017    Impaired mobility 12/12/2016    Pulmonary embolism (CMS-HCC) 12/12/2016    Necrotizing pneumonia (CMS-HCC) 12/12/2016    Parapneumonic effusion 11/21/2016    Pneumonia 11/20/2016    Localized edema 12/13/2015    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    Chronic diastolic (congestive) heart failure (CMS-HCC) 08/02/2015    Stage 3 chronic kidney disease (CMS-HCC) 04/05/2015    Loss of appetite 04/05/2015    PAD (peripheral artery disease) 04/05/2015    Aortic stenosis 03/11/2015    Weight loss 03/10/2015    Frailty     Tobacco abuse     Acute on chronic systolic heart failure, NYHA class 2 (CMS-HCC)     Palpable abdominal aorta 01/25/2015    Systolic murmur of aorta 04/29/2014    Weight loss, unintentional 05/01/2012    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      History of renal artery stenosis 11/29/2008     A history of renal artery stenosis, status post PTA to left renal artery on 05/2001.      Cerebral embolism with cerebral infarction (CMS-HCC) 11/29/2008     Left Lacunar Infarct  Left ICA stenosis 75%  Etiology of stroke: small vessel   - MRI with acute infract at junction of lentiform nucleus + post limb of internal capsule  - CTA h+n notable for L ICA stenosis (75%), R ICA stenosis (50%), right vert occlusion  - LDL 129   - A1c- 5.4  - ECHO- EF 55%, Grad 2 LV diastolic dysfunction. No shunt, no thrombus. LA 3.69cm.   - s/p right CEA and  Right ICA stent placement on 1/27.      Hypertension 11/29/2008    Hyperlipidemia 11/29/2008    CAD (coronary artery disease) 11/29/2008     03/11/15: Cardiac cath ( via Rt radial approach), 30%pLAD, 40%mLAD, 50-60%mLcx, 30-40%pRCA, 30%mRCA followed by 30% distal  - aggressive medial therapy for CAD.      H/O 11/29/2008         Review of Systems   Constitutional: Negative.   HENT: Negative.     Eyes: Negative.    Cardiovascular: Negative.    Respiratory: Negative.     Endocrine: Negative.    Hematologic/Lymphatic: Negative.    Skin: Negative.    Musculoskeletal: Negative.    Gastrointestinal: Negative.    Genitourinary: Negative.    Neurological: Negative.    Psychiatric/Behavioral: Negative.     Allergic/Immunologic: Negative.        Physical Exam    General Appearance: normal in appearance  Skin: warm, moist, no ulcers or xanthomas  Eyes: conjunctivae and lids normal, pupils are equal and round  Lips & Oral Mucosa: no pallor or cyanosis  Neck Veins: neck veins are flat, neck veins are not distended  Chest Inspection: chest is normal in appearance  Respiratory Effort: breathing comfortably, no respiratory distress  Auscultation/Percussion: lungs clear to auscultation, no rales or rhonchi, no wheezing  Cardiac Rhythm: regular rhythm and normal rate  Cardiac Auscultation: S1, S2 normal, no rub, no gallop  Murmurs:  II/VI systolic  murmur    Carotid Arteries: normal carotid upstroke bilaterally,  L CEA, bilateral bruits   Lower Extremity Edema: no lower extremity edema  Abdominal Exam: soft, non-tender, no  masses, bowel sounds normal  Liver & Spleen: no organomegaly  Language and Memory: patient responsive and seems to comprehend information  Neurologic Exam: neurological assessment grossly intact     Cardiovascular Studies      Cardiovascular Health Factors  Vitals BP Readings from Last 3 Encounters:   04/11/23 (!) 144/76   03/29/23 (!) 142/78   03/12/23 (!) 142/78     Wt Readings from Last 3 Encounters:   04/11/23 72.9 kg (160 lb 12.8 oz)   03/29/23 72.6 kg (160 lb)   03/12/23 75.1 kg (165 lb 9.1 oz)     BMI Readings from Last 3 Encounters:   04/11/23 23.07 kg/m?   03/29/23 22.96 kg/m?   03/12/23 23.76 kg/m?      Smoking Social History     Tobacco Use   Smoking Status Former    Current packs/day: 0.00    Average packs/day: 1.5 packs/day for 60.0 years (90.0 ttl pk-yrs)    Types: Cigarettes    Start date: 02/13/1958    Quit date: 02/13/2018    Years since quitting: 5.1   Smokeless Tobacco Never   Tobacco Comments    0.5-2 ppd history      Lipid Profile Cholesterol   Date Value Ref Range Status   04/04/2022 120  Final     HDL   Date Value Ref Range Status   04/04/2022 29 (L) >=40 Final     LDL   Date Value Ref Range Status   04/04/2022 70  Final     Triglycerides   Date Value Ref Range Status   04/04/2022 107  Final      Blood Sugar Hemoglobin A1C   Date Value Ref Range Status   05/28/2022 5.5 4.0 - 5.7 % Final     Comment:     The ADA recommends that most patients with type 1 and type 2 diabetes maintain   an A1c level <7%.       Glucose   Date Value Ref Range Status   03/28/2023 104  Final   03/12/2023 98 70 - 100 mg/dL Final   98/11/9145 829 (H) 70 - 100 mg/dL Final     Glucose, POC   Date Value Ref Range Status   01/12/2023 115 (H) 70 - 100 mg/dL Final   56/21/3086 578 70 - 100 MG/DL Final   46/96/2952 87 70 - 100 MG/DL Final          Problems Addressed Today  Encounter Diagnoses   Name Primary?    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 (CMS-HCC)     Cardiomyopathy, unspecified type (CMS-HCC)     Coronary artery disease involving native coronary artery of native heart without angina pectoris     Bilateral carotid artery stenosis     Nonrheumatic aortic valve stenosis     Systolic murmur of aorta     Stage 3 chronic kidney  disease, unspecified whether stage 3a or 3b CKD (CMS-HCC)     Pneumonia due to COVID-19 virus     History of renal artery stenosis     History of pulmonary embolus (PE)     NSVT (nonsustained ventricular tachycardia) (CMS-HCC)     Acute on chronic systolic heart failure, NYHA class 2 (CMS-HCC)     Tobacco use disorder     Impaired mobility     History of pneumonia     Frailty     Decreased functional mobility and endurance        Assessment and Plan      Assessment:     1.  Hospital discharge follow-up at [facility] between 03/10/2023 -- 03/12/2023  Patient was admitted with a hypertensive emergency and left kidney stone  He will follow-up with urology as an outpatient  2.  Episodes of paroxysmal tachycardia  Very likely due to paroxysmal atrial fibrillation  Patient is currently on metoprolol and anticoagulated with apixaban 2.5 mg p.o. twice daily  3. History of CVA-this occurred in July 2020  4.  History of severe left carotid artery stenosis  5.  Status post left CEA  Intraoperatively patient was found to have a stenosis distal to the operated area, he also underwent stent placement-he recovered well following the CVA  6.  History of severe aortic valve stenosis  7.  Status post TAVR, patient received #29 SAPIEN S3 valve in March 2021-normal function, last MG = 4 mmHg on an echocardiogram performed in 05/23/2022  8.  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  Nonobstructive coronary artery disease --no symptoms of angina  9.  Hypertension and recent episode of hypertensive urgency  In the past patient did have hypotension, he was switched from carvedilol to metoprolol XL  11.  Hyperlipidemia-on atorvastatin  12.  Abnormal EKG, bifascicular block-RBBB, LAD, LAFB  13.  Overall general weakness, debility due to multiple comorbidities and physical deconditioning --improved after undergoing physical therapy  14.  HFrEF-LVEF 45% by the last echocardiogram dated 03/29/2023      Plan:    1.  Add lisinopril 2.5 mg p.o. twice daily  2.  Continue all other current cardiac medications  3.  Improve nutritional status  4.  Check a vitamin D3 level  5.  Continue physical therapy  6.  Follow-up office visit in 6 months      Total Time Today was 40 minutes in the following activities: Preparing to see the patient, Obtaining and/or reviewing separately obtained history, Performing a medically appropriate examination and/or evaluation, Counseling and educating the patient/family/caregiver, Ordering medications, tests, or procedures, Referring and communication with other health care professionals (when not separately reported), Documenting clinical information in the electronic or other health record, Independently interpreting results (not separately reported) and communicating results to the patient/family/caregiver, and Care coordination (not separately reported)          Current Medications (including today's revisions)   albuterol (VENTOLIN HFA) 90 mcg/actuation inhaler Inhale two puffs by mouth into the lungs every 6 hours as needed for Wheezing or Shortness of Breath. Shake well before use.    albuterol 0.083% (PROVENTIL) 2.5 mg /3 mL (0.083 %) nebulizer solution Inhale 3 mL solution by nebulizer as directed twice daily as needed.    apixaban (ELIQUIS) 2.5 mg tablet TAKE 1 TABLET BY MOUTH TWO TIMES A DAY    ascorbic acid (vitamin C) 500 mg tablet Take one tablet by mouth  daily.    atorvastatin (LIPITOR) 20 mg tablet Take one tablet by mouth daily.    benzonatate (TESSALON) 200 mg capsule Take one capsule by mouth every 8 hours. Indications: cough    budesonide (PULMICORT) 0.5 mg/2 mL nebulizer solution Inhale 2 mL solution by nebulizer as directed twice daily.    CHOLEcalciferoL (vitamin D3) (DIALYVITE VITAMIN D) 125 mcg (5,000 unit) capsule Take one capsule by mouth daily.    clopiDOGrel (PLAVIX) 75 mg tablet Take one tablet by mouth daily.    dutasteride (AVODART) 0.5 mg PO capsule Take one capsule by mouth daily.    food supplemt, lactose-reduced (ENSURE PLUS) 0.05 gram- 1.5 kcal/mL oral liquid Take 1 Can by mouth twice daily with meals.    furosemide (LASIX) 20 mg tablet Take one tablet by mouth daily as needed.    guaiFENesin LA (MUCINEX) 600 mg tablet Take one tablet by mouth twice daily. Indications: cough    levothyroxine (SYNTHROID) 88 mcg tablet Take one tablet by mouth daily 30 minutes before breakfast.    metoprolol succinate XL (TOPROL XL) 25 mg extended release tablet TAKE 1 TABLET BY MOUTH TWO TIMES A DAY    montelukast (SINGULAIR) 10 mg tablet Take one tablet by mouth at bedtime daily.    MYRBETRIQ 25 mg tablet Take one tablet by mouth daily.    other medication Prevagen Regular Strength - Take 1 capsule by mouth daily    pantoprazole DR (PROTONIX) 40 mg tablet Take one tablet by mouth daily.    zinc sulfate 220 mg (50 mg elemental zinc) capsule Take one capsule by mouth daily.

## 2023-04-17 ENCOUNTER — Encounter: Admit: 2023-04-17 | Discharge: 2023-04-17 | Payer: MEDICARE

## 2023-04-17 MED ORDER — LISINOPRIL 2.5 MG PO TAB
2.5 mg | ORAL_TABLET | Freq: Two times a day (BID) | ORAL | 3 refills | Status: AC
Start: 2023-04-17 — End: ?

## 2023-04-17 NOTE — Telephone Encounter
 Received a call from Amber at [facility] home health to verify dosage of Javier Gutierrez's lisinopril. In the OV note Dr. Letitia Neri plan is for him to increase to 2.5 twice daily but when she sent in prescription it was directed for once daily with a 30 day supply of 60 tablets. Prescription corrected and sent to pharmacy. Notified Amber of correct dosage.

## 2023-04-23 ENCOUNTER — Encounter: Admit: 2023-04-23 | Discharge: 2023-04-23

## 2023-04-23 DIAGNOSIS — J9601 Acute respiratory failure with hypoxia: Secondary | ICD-10-CM

## 2023-04-23 DIAGNOSIS — I161 Hypertensive emergency: Secondary | ICD-10-CM

## 2023-04-23 DIAGNOSIS — I10 Essential (primary) hypertension: Secondary | ICD-10-CM

## 2023-04-23 DIAGNOSIS — N183 Stage 3 chronic kidney disease, unspecified whether stage 3a or 3b CKD (CMS-HCC): Secondary | ICD-10-CM

## 2023-04-23 DIAGNOSIS — I4729 NSVT (nonsustained ventricular tachycardia) (CMS-HCC): Secondary | ICD-10-CM

## 2023-04-23 LAB — 25-OH VITAMIN D (D2 + D3): VITAMIN D (25-OH) TOTAL: 62

## 2023-04-25 ENCOUNTER — Encounter: Admit: 2023-04-25 | Discharge: 2023-04-25

## 2023-04-25 ENCOUNTER — Ambulatory Visit: Admit: 2023-04-25 | Discharge: 2023-04-25

## 2023-04-29 ENCOUNTER — Encounter: Admit: 2023-04-29 | Discharge: 2023-04-29

## 2023-04-29 MED ORDER — METOPROLOL SUCCINATE 25 MG PO TB24
12.5 mg | ORAL_TABLET | Freq: Every day | ORAL | 3 refills | 90.00000 days | Status: AC
Start: 2023-04-29 — End: ?

## 2023-04-29 NOTE — Telephone Encounter
 Received a call from Amber with [facility] stating that patient was started on Flomax on Thursday night. She states that patient became hypotensive on Friday morning and went to the ER. Testing was unremarkable and patient was sent home. Amber states that over the weekend patient's blood pressures were up and down. She states that they called the on call doctor over the weekend and was instructed to hold Metoprolol and lisinopril. Amber took blood pressures today. Sitting 142/68, standing 68/40, sitting 150/66 with HR 90s. Amber states that patient was placed on the Flomax for a kidney stone but she thinks that this medicine was discontinued as patient's kidney stone issue has resolved.

## 2023-05-11 ENCOUNTER — Encounter: Admit: 2023-05-11 | Discharge: 2023-05-11 | Payer: MEDICARE

## 2023-05-11 ENCOUNTER — Ambulatory Visit: Admit: 2023-05-11 | Discharge: 2023-05-11 | Payer: MEDICARE

## 2023-05-11 ENCOUNTER — Observation Stay: Admit: 2023-05-11 | Discharge: 2023-05-12 | Payer: MEDICARE

## 2023-05-11 ENCOUNTER — Emergency Department: Admit: 2023-05-11 | Discharge: 2023-05-11 | Payer: MEDICARE

## 2023-05-11 DIAGNOSIS — R051 Acute cough: Secondary | ICD-10-CM

## 2023-05-11 NOTE — Progress Notes
 Telehealth Visit Note    Date of Service: 05/11/2023    Subjective:           Javier Gutierrez is a 83 y.o. male.    History of Present Illness  He presents to the virtual urgent care today with his daughter.  He has been having runny nose since Wednesday.  Tried allergy medicine which seemed to help his itchy eyes and scratchy throat.  He was evaluated by home health yesterday and RN stated that his lungs were clear.  Tested on a home test for COVID and flu which was negative.  Daughter reports that he has been coughing throughout the night which is making it difficult for him to eat or drink.  She notes that he does have history of aspiration.  She has tried putting him in a hot shower to help decrease the mucus which is not currently helping.               Objective:          albuterol (VENTOLIN HFA) 90 mcg/actuation inhaler Inhale two puffs by mouth into the lungs every 6 hours as needed for Wheezing or Shortness of Breath. Shake well before use.    albuterol 0.083% (PROVENTIL) 2.5 mg /3 mL (0.083 %) nebulizer solution Inhale 3 mL solution by nebulizer as directed twice daily as needed.    apixaban (ELIQUIS) 2.5 mg tablet TAKE 1 TABLET BY MOUTH TWO TIMES A DAY    ascorbic acid (vitamin C) 500 mg tablet Take one tablet by mouth daily.    atorvastatin (LIPITOR) 20 mg tablet Take one tablet by mouth daily.    benzonatate (TESSALON) 200 mg capsule Take one capsule by mouth every 8 hours. Indications: cough (Patient taking differently: Take one capsule by mouth as Needed. Indications: cough)    budesonide (PULMICORT) 0.5 mg/2 mL nebulizer solution Inhale 2 mL solution by nebulizer as directed twice daily.    CHOLEcalciferoL (vitamin D3) (DIALYVITE VITAMIN D) 125 mcg (5,000 unit) capsule Take one capsule by mouth daily.    clopiDOGrel (PLAVIX) 75 mg tablet Take one tablet by mouth daily.    dutasteride (AVODART) 0.5 mg PO capsule Take one capsule by mouth daily.    food supplemt, lactose-reduced (ENSURE PLUS) 0.05 gram- 1.5 kcal/mL oral liquid Take 1 Can by mouth twice daily with meals.    furosemide (LASIX) 20 mg tablet Take one tablet by mouth daily as needed.    guaiFENesin LA (MUCINEX) 600 mg tablet Take one tablet by mouth twice daily. Indications: cough (Patient taking differently: Take one tablet by mouth as Needed. Indications: cough)    levothyroxine (SYNTHROID) 88 mcg tablet Take one tablet by mouth daily 30 minutes before breakfast.    metoprolol succinate XL (TOPROL XL) 25 mg extended release tablet Take one-half tablet by mouth daily.    montelukast (SINGULAIR) 10 mg tablet Take one tablet by mouth at bedtime daily.    MYRBETRIQ 25 mg tablet Take one tablet by mouth daily.    other medication Prevagen Regular Strength - Take 1 capsule by mouth daily    pantoprazole DR (PROTONIX) 40 mg tablet Take one tablet by mouth daily.    tamsulosin (FLOMAX) 0.4 mg capsule Take one capsule by mouth daily. Take 30 min after same meal.  Do not cut/ crush/ chew.    zinc sulfate 220 mg (50 mg elemental zinc) capsule Take one capsule by mouth daily.  Computed Telehealth Body Mass Index unavailable. One or more values for this score either were not found within the given timeframe or did not fit some other criterion.    Physical Exam  Constitutional:       General: He is not in acute distress.     Appearance: Normal appearance. He is ill-appearing.   HENT:      Head: Normocephalic.      Nose: Congestion and rhinorrhea present.   Eyes:      Extraocular Movements: Extraocular movements intact.      Conjunctiva/sclera: Conjunctivae normal.   Pulmonary:      Effort: Pulmonary effort is normal.      Comments: Persistent cough, wet junky  Neurological:      General: No focal deficit present.      Mental Status: He is alert and oriented to person, place, and time.   Psychiatric:         Mood and Affect: Mood normal.              Assessment and Plan:  Referred to the emergency room for assessment and treatment.  Daughter is agreeable to this plan and will take him now.  He does not appear to be in any respiratory distress at this time.  She feels confident taking him by private vehicle.

## 2023-05-12 ENCOUNTER — Encounter: Admit: 2023-05-12 | Discharge: 2023-05-12 | Payer: MEDICARE

## 2023-05-12 MED FILL — DOXYCYCLINE HYCLATE 100 MG PO TAB: 100 mg | ORAL | 4 days supply | Qty: 8 | Fill #1 | Status: CP

## 2023-05-12 MED FILL — BENZONATATE 200 MG PO CAP: 200 mg | ORAL | 7 days supply | Qty: 20 | Fill #1 | Status: CP

## 2023-05-12 MED FILL — PREDNISONE 20 MG PO TAB: 20 mg | ORAL | 4 days supply | Qty: 8 | Fill #1 | Status: CP

## 2023-05-17 ENCOUNTER — Encounter: Admit: 2023-05-17 | Discharge: 2023-05-17 | Payer: MEDICARE

## 2023-05-17 ENCOUNTER — Emergency Department
Admit: 2023-05-17 | Discharge: 2023-05-17 | Disposition: A | Discharge status: Home / Self Care | Payer: MEDICARE | Attending: Family

## 2023-05-17 ENCOUNTER — Emergency Department: Admit: 2023-05-17 | Discharge: 2023-05-17 | Payer: MEDICARE | Attending: Family

## 2023-05-18 ENCOUNTER — Encounter: Admit: 2023-05-18 | Discharge: 2023-05-18 | Payer: MEDICARE

## 2023-05-31 ENCOUNTER — Encounter: Admit: 2023-05-31 | Discharge: 2023-05-31 | Payer: MEDICARE

## 2023-05-31 DIAGNOSIS — R0602 Shortness of breath: Secondary | ICD-10-CM

## 2023-06-02 ENCOUNTER — Encounter: Admit: 2023-06-02 | Discharge: 2023-06-02 | Payer: MEDICARE

## 2023-06-06 ENCOUNTER — Encounter: Admit: 2023-06-06 | Discharge: 2023-06-06 | Payer: MEDICARE

## 2023-06-06 ENCOUNTER — Emergency Department: Admit: 2023-06-06 | Discharge: 2023-06-06 | Payer: MEDICARE

## 2023-06-06 VITALS — HR 123

## 2023-06-06 VITALS — BP 140/101 | HR 125

## 2023-06-06 VITALS — BP 184/97 | HR 92

## 2023-06-06 VITALS — BP 176/94 | HR 90

## 2023-06-06 VITALS — BP 171/94 | HR 106

## 2023-06-06 VITALS — BP 151/100 | HR 108 | Temp 98.80000°F | Wt 160.0 lb

## 2023-06-06 VITALS — BP 172/98 | HR 102

## 2023-06-06 DIAGNOSIS — J9601 Acute respiratory failure with hypoxia: Secondary | ICD-10-CM

## 2023-06-06 DIAGNOSIS — I5042 Chronic combined systolic (congestive) and diastolic (congestive) heart failure: Secondary | ICD-10-CM

## 2023-06-06 LAB — COMPREHENSIVE METABOLIC PANEL
~~LOC~~ BKR ALBUMIN: 3.3 g/dL — ABNORMAL LOW (ref 3.5–5.0)
~~LOC~~ BKR ALK PHOSPHATASE: 45 U/L — ABNORMAL LOW (ref 25–110)
~~LOC~~ BKR ALT: 20 U/L (ref 7–56)
~~LOC~~ BKR ANION GAP: 12 10*3/uL — ABNORMAL HIGH (ref 3–12)
~~LOC~~ BKR AST: 24 U/L (ref 7–40)
~~LOC~~ BKR BLD UREA NITROGEN: 36 mg/dL — ABNORMAL HIGH (ref 7–25)
~~LOC~~ BKR CALCIUM: 8.8 mg/dL — ABNORMAL HIGH (ref 8.5–10.6)
~~LOC~~ BKR CHLORIDE: 98 mmol/L (ref 98–110)
~~LOC~~ BKR CO2: 25 mmol/L (ref 21–30)
~~LOC~~ BKR CREATININE: 1.2 mg/dL — ABNORMAL HIGH (ref 0.40–1.24)
~~LOC~~ BKR GLOMERULAR FILTRATION RATE (GFR): 56 mL/min — ABNORMAL LOW (ref >60–4.80)
~~LOC~~ BKR POTASSIUM: 4.9 mmol/L — ABNORMAL HIGH (ref 3.5–5.1)
~~LOC~~ BKR TOTAL BILIRUBIN: 0.6 mg/dL (ref 0.2–1.3)
~~LOC~~ BKR TOTAL PROTEIN: 6.3 g/dL — ABNORMAL LOW (ref 6.0–8.0)

## 2023-06-06 LAB — ECG 12-LEAD
P AXIS: 64 degrees
P-R INTERVAL: 158 ms
Q-T INTERVAL: 374 ms
QRS DURATION: 122 ms
QTC CALCULATION (BAZETT): 506 ms
R AXIS: -65 degrees
T AXIS: 64 degrees
VENTRICULAR RATE: 110 {beats}/min

## 2023-06-06 LAB — CBC AND DIFF
~~LOC~~ BKR ABSOLUTE BASO COUNT: 0.1 10*3/uL (ref 0.00–0.20)
~~LOC~~ BKR ABSOLUTE EOS COUNT: 0 10*3/uL (ref 0.00–0.45)
~~LOC~~ BKR ABSOLUTE MONO COUNT: 0.4 10*3/uL (ref 0.00–0.80)
~~LOC~~ BKR MCV: 81 fL — ABNORMAL HIGH (ref 80.0–100.0)
~~LOC~~ BKR MDW (MONOCYTE DISTRIBUTION WIDTH): 22 — ABNORMAL HIGH (ref <=20.6)
~~LOC~~ BKR RBC COUNT: 5.1 10*6/uL — ABNORMAL LOW (ref 4.40–5.50)
~~LOC~~ BKR WBC COUNT: 10 10*3/uL (ref 4.50–11.00)

## 2023-06-06 LAB — PROCALCITONIN: ~~LOC~~ BKR PROCALCITONIN: 0 ng/mL

## 2023-06-06 MED ORDER — IOHEXOL 350 MG IODINE/ML IV SOLN
60 mL | Freq: Once | INTRAVENOUS | 0 refills | Status: CP
Start: 2023-06-06 — End: ?
  Administered 2023-06-07: 01:00:00 60 mL via INTRAVENOUS

## 2023-06-06 MED ORDER — SODIUM CHLORIDE 0.9 % IJ SOLN
50 mL | Freq: Once | INTRAVENOUS | 0 refills | Status: CP
Start: 2023-06-06 — End: ?
  Administered 2023-06-07: 01:00:00 50 mL via INTRAVENOUS

## 2023-06-06 MED ORDER — PIPERACILLIN/TAZOBACTAM 4.5 G/100ML NS IVPB (MB+)
4.5 g | Freq: Once | INTRAVENOUS | 0 refills | Status: CP
Start: 2023-06-06 — End: ?
  Administered 2023-06-07 (×2): 4.5 g via INTRAVENOUS

## 2023-06-06 NOTE — ED Notes
 83 yo male presenting to ED with CC of shortness of breath. Per pt's daughter bedside, recent hospitalization for IV antibiotics for pneumonia. Pt seems to to be more SOA at home. Pt denies CP, fevers, chills. Pt is AO x 4, breathing is even, unlabored, skin is warm, dry/intact. Pt attached to monitor, given call light with instructions and warm blanket. Pt also given ausculting  PEP and sputum cup to encourage pt to productive cough.     Past Medical History:    Acute on chronic systolic heart failure, NYHA class 2 (CMS-HCC)    Amyloidosis (CMS-HCC)    Aortic valve stenosis, mild    Arrhythmia    Arterial occlusion    Arthritis    Asthma    CAD (coronary artery disease)    Cardiomyopathy (CMS-HCC)    Carotid artery plaque    Cataract    CHF (congestive heart failure) (CMS-HCC)    Chronic kidney disease    Colon polyps    COPD (chronic obstructive pulmonary disease) (CMS-HCC)    Coronary atherosclerosis    Dizziness    Dyspnea    Frailty    H/O: CVA (cardiovascular accident)    History of renal artery stenosis    HTN    Hyperlipidemia    Hyperlipidemia    Hypertension    Hypothyroidism    Kidney disease    Kidney stones    Lung disease    Peripheral vascular disease    Tobacco abuse    Valvular heart disease

## 2023-06-06 NOTE — H&P (View-Only)
 Internal Medicine History and Physical      Patient's Name:  Javier Gutierrez MRN: [id number]   Today's Date:  06/07/2023  Admission Date: 06/06/2023  LOS: 1 day    Problem list  Principal Problem:    Hypoxia      History of Present Illness:     Chief Complaint:  shortness of breath    Javier Gutierrez is a 83 y.o. man with a pertinent PMH of COPD, HFrEF, afib, hypothyroidism who was admitted on 06/06/2023 for shortness of breath with multifocal pneumonia. Patient reports he has had worsening shortness of breath over the past few days with a productive cough with yellow sputum. Daughter notes his oxygen sats were in the mid 80s at home prompting them to come in. He had recently finished treatment for pseudomonal pneumonia on 5/12 with levofloxacin [facility]. Daughter notes he had been improving but then over the past two days had worsening shortness of breath. Denies any fevers/chills at home. Prior to the admission at [facility] patient had rhinovirus and pneumonia April 2025. He also received a course of prednisone at [facility] for COPD exacerbation and is finishing a steroid taper. CTA chest completed in the ED showed bibasilar clustered nodular opacities consistent with multifocal pneumonia. Daughter does note history of aspiration events and concern for further aspiration. Patient was started on zosyn and admitted for IV antibiotics.       Assessment and Plan:     #Multifocal Pneumonia w/ concern for aspiration  #Shortness of Breath  - CTA chest on admission with LLL consolidation and development of bibasilar nodular opacities consistent with multifocal pneumonia and cental and peripheral bronchiectasis  - patient recently admitted at [facility] for [facility] and treated with zosyn followed by a week long course of Levaquin through 5/12, daughter reports breathing again worsened over the past few days  - reports history of aspiration  PLAN:  > continue zosyn for pneumonia  > MRSA nares ordered   > follow blood cx  > SLP consulted for concern for aspiration    #COPD  - patient recently treated for COPD exacerbation and reports he is finishing a steroid taper at home  PLAN:  > continue PTA budesonide and albuterol PRN  > continue PTA montelukast  > continue PTA steroid taper with prednisone 5mg  qday    #HFrEF  #PAD s/p stent  #Atrial fibrillation  - TTE 3/7: LVEF 45%. Segmental WMA. Mild LV diastolic dysfunction. Trace MR.  - BNP elevated on admission to 2297, patient appears euvolemic on exam and is at his dry weight of 164lbs  - hs troponin mildly elevated in ED 36->40->43, patient denies any chest pain  - EKG in ED with sinus tach and bifascicular block, noted on prior EKGs  PLAN:  > continue PTA statin and plavix  > continue PTA apixaban  > continue PTA metoprolol  > will hold off on diuretics at this time as patient appears euvolemic  > trend trops to peak    #Hypothyroidism  > continue PTA levothyroxine    Diet: Diet Cardiac (Low Fat/Low Sodium)  VTE ppx: Continue PTA apixiban  CODE: Full Code    DISPO: Admit to medicine.    Patient was discussed with Dr. Lenny Rack.    Lizette Righter, MD  Internal Medicine, PGY-1    Subjective:     Review of Systems:  Review of Systems   Constitutional:  Positive for malaise/fatigue. Negative for chills and fever.   HENT:  Negative  for congestion.    Respiratory:  Positive for cough, sputum production and shortness of breath. Negative for wheezing.    Cardiovascular:  Negative for chest pain and leg swelling.   Gastrointestinal:  Negative for abdominal pain, constipation, diarrhea, nausea and vomiting.   Genitourinary:  Negative for dysuria and frequency.   Musculoskeletal:  Negative for myalgias.   Neurological:  Negative for dizziness and headaches.       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    Social history:  - Tobacco: denies  - Alcohol: denies  - Substances: denies    Social History     Tobacco Use    Smoking status: Former     Current packs/day: 0.00     Average packs/day: 1.5 packs/day for 60.0 years (90.0 ttl pk-yrs)     Types: Cigarettes     Start date: 02/13/1958     Quit date: 02/13/2018     Years since quitting: 5.3    Smokeless tobacco: Never    Tobacco comments:     0.5-2 ppd history   Vaping Use    Vaping status: Never Used   Substance Use Topics    Alcohol use: Not Currently    Drug use: Never       Past surgical history:  The patient  has a past surgical history that includes lumbar diskectomy (01/22/1966); stent intravascular (01/23/2004); Upper gastrointestinal endoscopy (N/A, 12/06/2016); bronchoscopy (Bilateral, 11/30/2016); bronchoscopy (Right, 11/30/2016); aortic valve replacement (N/A, 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    Family History:  family history includes Aortic Disease/Dissection in his father; Cancer in his mother; Heart Disease in his father; Other in his father and mother; Stroke in his mother.    Objective:   Vital Signs:  BP (!) 176/94  - Pulse 90  - Temp 37.1 ?C (98.8 ?F)  - Wt 72.6 kg (160 lb)  - SpO2 96%  - BMI 22.96 kg/m?     Physical Exam  Constitutional:       Appearance: Normal appearance.   HENT:      Mouth/Throat:      Mouth: Mucous membranes are moist.      Pharynx: Oropharynx is clear.   Eyes:      Extraocular Movements: Extraocular movements intact.      Conjunctiva/sclera: Conjunctivae normal.   Neck:      Vascular: No JVD.   Cardiovascular:      Rate and Rhythm: Normal rate and regular rhythm.   Pulmonary:      Effort: Pulmonary effort is normal.      Breath sounds: Rales (diffuse) present.   Abdominal:      General: Abdomen is flat. Bowel sounds are normal.      Palpations: Abdomen is  soft.      Tenderness: There is no abdominal tenderness.   Musculoskeletal:      Right lower leg: No edema.      Left lower leg: No edema.   Skin:     General: Skin is warm and dry.   Neurological:      General: No focal deficit present.      Mental Status: He is alert.          Labs:  Recent Labs     06/06/23  1646   HGB 13.2*   HCT 41.5   WBC 10.80   PLTCT 139*   NA 135*   K 4.9   CL 98   CO2 25   BUN 36*   CR 1.27*   GLU 114*   CA 8.8   ALBUMIN 3.3*   TOTPROT 6.3   TOTBILI 0.6   AST 24   ALT 20   ALKPHOS 45   Glucose: (!) 114 (06/06/23 1646)    Pertinent Radiology Reviewed.     Meds:  Scheduled Meds:apixaban (ELIQUIS) tablet 2.5 mg, 2.5 mg, Oral, BID  atorvastatin (LIPITOR) tablet 20 mg, 20 mg, Oral, QDAY  budesonide (PULMICORT) nebulizer solution 0.5 mg, 0.5 mg, Inhalation, BID  clopiDOGreL (PLAVIX) tablet 75 mg, 75 mg, Oral, QDAY  levothyroxine (SYNTHROID) tablet 88 mcg, 88 mcg, Oral, QDAY 30 min before breakfast  metoprolol succinate XL (TOPROL XL) tablet 12.5 mg, 12.5 mg, Oral, QDAY  montelukast (SINGULAIR) tablet 10 mg, 10 mg, Oral, QHS  pantoprazole DR (PROTONIX) tablet 40 mg, 40 mg, Oral, QDAY  piperacillin/tazobactam (ZOSYN) 4.5 g in sodium chloride 0.9% (NS) 100 mL IVPB (MB+), 4.5 g, Intravenous, Q6H*  predniSONE (ORASONE) tablet 5 mg, 5 mg, Oral, QDAY    Continuous Infusions:  PRN and Respiratory Meds:albuterol 0.083% QID PRN, melatonin QHS PRN, ondansetron Q6H PRN **OR** ondansetron (ZOFRAN) IV Q6H PRN, polyethylene glycol 3350 QDAY PRN, sennosides-docusate sodium QDAY PRN

## 2023-06-07 ENCOUNTER — Inpatient Hospital Stay: Admit: 2023-06-07 | Discharge: 2023-06-07 | Payer: MEDICARE

## 2023-06-07 VITALS — HR 108

## 2023-06-07 VITALS — BP 150/83 | HR 88

## 2023-06-07 VITALS — BP 158/87 | HR 80

## 2023-06-07 VITALS — BP 159/82 | HR 102 | Temp 98.00000°F

## 2023-06-07 VITALS — HR 84

## 2023-06-07 VITALS — HR 83

## 2023-06-07 VITALS — BP 158/80 | HR 99

## 2023-06-07 VITALS — BP 177/84 | HR 83

## 2023-06-07 VITALS — BP 176/91 | HR 92 | Temp 97.70000°F | Ht 70.0 in | Wt 154.3 lb

## 2023-06-07 LAB — COMPREHENSIVE METABOLIC PANEL
~~LOC~~ BKR ALT: 13 U/L (ref 7–56)
~~LOC~~ BKR ANION GAP: 9 (ref 3–12)
~~LOC~~ BKR AST: 11 U/L (ref 7–40)
~~LOC~~ BKR BLD UREA NITROGEN: 30 mg/dL — ABNORMAL HIGH (ref 7–25)
~~LOC~~ BKR CALCIUM: 7.8 mg/dL — ABNORMAL LOW (ref 8.5–10.6)
~~LOC~~ BKR CHLORIDE: 102 mmol/L — ABNORMAL LOW (ref 98–110)
~~LOC~~ BKR CO2: 26 mmol/L (ref 21–30)
~~LOC~~ BKR CREATININE: 1 mg/dL (ref 0.40–1.24)
~~LOC~~ BKR GLOMERULAR FILTRATION RATE (GFR): >60 mL/min (ref >60)
~~LOC~~ BKR GLUCOSE, RANDOM: 87 mg/dL — ABNORMAL LOW (ref 70–100)
~~LOC~~ BKR POTASSIUM: 3.3 mmol/L — ABNORMAL LOW (ref 3.5–5.1)
~~LOC~~ BKR SODIUM, SERUM: 137 mmol/L (ref 137–147)

## 2023-06-07 LAB — RVP VIRAL PANEL PCR: ~~LOC~~ BKR HUMAN RHINOVIRUS/ENTEROVIRUS: DETECTED — AB

## 2023-06-07 LAB — HIGH SENSITIVITY TROPONIN I 2 HOUR
~~LOC~~ BKR HIGH SENSITIVITY TROPONIN I 2 HOUR: 40 ng/L — ABNORMAL HIGH (ref <20)
~~LOC~~ BKR HIGH SENSITIVITY TROPONIN I 2 HOUR: 50 ng/L — ABNORMAL HIGH (ref <20)
~~LOC~~ BKR HIGH SENSITIVITY TROPONIN I DELTA VALUE: 4
~~LOC~~ BKR HIGH SENSITIVITY TROPONIN I DELTA VALUE: 3

## 2023-06-07 LAB — HIGH SENSITIVITY TROPONIN I 0 HOUR: ~~LOC~~ BKR HIGH SENSITIVITY TROPONIN I 0 HOUR: 47 ng/L — ABNORMAL HIGH (ref <20)

## 2023-06-07 LAB — MRSA BY PCR (NASAL)

## 2023-06-07 LAB — HIGH SENSITIVITY TROPONIN I 4 HR
~~LOC~~ BKR HI SEN TNI DELTA 4-2: 3
~~LOC~~ BKR HI SEN TNI DELTA 4-2: 2
~~LOC~~ BKR HIGH SENSITIVITY TROPONIN I 4 HOUR: 43 ng/L — ABNORMAL HIGH (ref <20)
~~LOC~~ BKR HIGH SENSITIVITY TROPONIN I 4 HOUR: 52 ng/L — ABNORMAL HIGH (ref <20)

## 2023-06-07 LAB — CBC
~~LOC~~ BKR MCHC: 31 g/dL — ABNORMAL LOW (ref 32.0–36.0)
~~LOC~~ BKR MPV: 8.6 fL (ref 7.0–11.0)
~~LOC~~ BKR PLATELET COUNT: 113 10*3/uL — ABNORMAL LOW (ref 150–400)
~~LOC~~ BKR RDW: 18 % — ABNORMAL HIGH (ref 11.0–15.0)

## 2023-06-07 MED ORDER — BARIUM SULFATE 40 %(W/V), 29% (W/W)(1500 CPS) PO SUSP
20 mL | Freq: Once | ORAL | 0 refills | Status: CP
Start: 2023-06-07 — End: ?
  Administered 2023-06-07: 20:00:00 20 mL via ORAL

## 2023-06-07 MED ORDER — MELATONIN 5 MG PO TAB
5 mg | Freq: Every evening | ORAL | 0 refills | Status: DC | PRN
Start: 2023-06-07 — End: 2023-06-17

## 2023-06-07 MED ORDER — APIXABAN 5 MG PO TAB
2.5 mg | Freq: Two times a day (BID) | ORAL | 0 refills | Status: DC
Start: 2023-06-07 — End: 2023-06-17
  Administered 2023-06-08 – 2023-06-17 (×19): 2.5 mg via ORAL

## 2023-06-07 MED ORDER — MONTELUKAST 10 MG PO TAB
10 mg | Freq: Every evening | ORAL | 0 refills | Status: DC
Start: 2023-06-07 — End: 2023-06-17
  Administered 2023-06-07 – 2023-06-17 (×10): 10 mg via ORAL

## 2023-06-07 MED ORDER — ONDANSETRON 4 MG PO TBDI
4 mg | ORAL | 0 refills | Status: DC | PRN
Start: 2023-06-07 — End: 2023-06-17

## 2023-06-07 MED ORDER — ATORVASTATIN 10 MG PO TAB
20 mg | Freq: Every day | ORAL | 0 refills | Status: DC
Start: 2023-06-07 — End: 2023-06-17
  Administered 2023-06-07 – 2023-06-17 (×11): 20 mg via ORAL

## 2023-06-07 MED ORDER — CLOPIDOGREL 75 MG PO TAB
75 mg | Freq: Every day | ORAL | 0 refills | Status: DC
Start: 2023-06-07 — End: 2023-06-17
  Administered 2023-06-07 – 2023-06-17 (×11): 75 mg via ORAL

## 2023-06-07 MED ORDER — PANTOPRAZOLE 40 MG PO TBEC
40 mg | Freq: Every day | ORAL | 0 refills | Status: DC
Start: 2023-06-07 — End: 2023-06-17
  Administered 2023-06-07 – 2023-06-17 (×10): 40 mg via ORAL

## 2023-06-07 MED ORDER — IMS MIXTURE TEMPLATE
15 mg | Freq: Once | ORAL | 0 refills | Status: CP
Start: 2023-06-07 — End: ?
  Administered 2023-06-07 (×2): 15 mg via ORAL

## 2023-06-07 MED ORDER — IPRATROPIUM-ALBUTEROL 0.5 MG-3 MG(2.5 MG BASE)/3 ML IN NEBU
3 mL | Freq: Four times a day (QID) | RESPIRATORY_TRACT | 0 refills | Status: DC | PRN
Start: 2023-06-07 — End: 2023-06-16
  Administered 2023-06-07 – 2023-06-16 (×34): 3 mL via RESPIRATORY_TRACT

## 2023-06-07 MED ORDER — ALBUTEROL SULFATE 2.5 MG /3 ML (0.083 %) IN NEBU
2.5 mg | Freq: Four times a day (QID) | RESPIRATORY_TRACT | 0 refills | Status: DC | PRN
Start: 2023-06-07 — End: 2023-06-17

## 2023-06-07 MED ORDER — BARIUM SULFATE 81 % (W/W) PO POWD
90 mL | Freq: Once | ORAL | 0 refills | Status: CP
Start: 2023-06-07 — End: ?
  Administered 2023-06-07: 20:00:00 90 mL via ORAL

## 2023-06-07 MED ORDER — PREDNISONE 10 MG PO TAB
10 mg | Freq: Every day | ORAL | 0 refills | Status: CP
Start: 2023-06-07 — End: ?
  Administered 2023-06-08 – 2023-06-10 (×3): 10 mg via ORAL

## 2023-06-07 MED ORDER — POTASSIUM CHLORIDE 20 MEQ PO TBTQ
20 meq | Freq: Once | ORAL | 0 refills | Status: CP
Start: 2023-06-07 — End: ?
  Administered 2023-06-07: 11:00:00 20 meq via ORAL

## 2023-06-07 MED ORDER — ONDANSETRON HCL (PF) 4 MG/2 ML IJ SOLN
4 mg | INTRAVENOUS | 0 refills | Status: DC | PRN
Start: 2023-06-07 — End: 2023-06-17

## 2023-06-07 MED ORDER — LEVOTHYROXINE 88 MCG PO TAB
88 ug | Freq: Every day | ORAL | 0 refills | Status: DC
Start: 2023-06-07 — End: 2023-06-17
  Administered 2023-06-07 – 2023-06-17 (×10): 88 ug via ORAL

## 2023-06-07 MED ORDER — BARIUM SULFATE 40 % (W/V), 30% (W/W) PO PSTE
10 mL | Freq: Once | ORAL | 0 refills | Status: CP
Start: 2023-06-07 — End: ?
  Administered 2023-06-07: 20:00:00 10 mL via ORAL

## 2023-06-07 MED ORDER — BARIUM SULFATE 40 % (W/V) PO SUSP
60 mL | Freq: Once | ORAL | 0 refills | Status: CP
Start: 2023-06-07 — End: ?
  Administered 2023-06-07: 20:00:00 60 mL via ORAL

## 2023-06-07 MED ORDER — PREDNISONE 1 MG PO TAB
5 mg | Freq: Every day | ORAL | 0 refills | Status: DC
Start: 2023-06-07 — End: 2023-06-07
  Administered 2023-06-07: 14:00:00 5 mg via ORAL

## 2023-06-07 MED ORDER — NYSTATIN 100,000 UNIT/GRAM TP POWD
Freq: Two times a day (BID) | TOPICAL | 0 refills | Status: DC | PRN
Start: 2023-06-07 — End: 2023-06-17
  Administered 2023-06-09: 03:00:00 via TOPICAL

## 2023-06-07 MED ORDER — BUDESONIDE 0.5 MG/2 ML IN NBSP
.5 mg | Freq: Two times a day (BID) | RESPIRATORY_TRACT | 0 refills | Status: DC
Start: 2023-06-07 — End: 2023-06-17
  Administered 2023-06-07 – 2023-06-17 (×21): 0.5 mg via RESPIRATORY_TRACT

## 2023-06-07 MED ORDER — METOPROLOL SUCCINATE 25 MG PO TB24
12.5 mg | Freq: Every day | ORAL | 0 refills | Status: DC
Start: 2023-06-07 — End: 2023-06-08
  Administered 2023-06-07 – 2023-06-08 (×2): 12.5 mg via ORAL

## 2023-06-07 MED ORDER — APIXABAN 5 MG PO TAB
2.5 mg | Freq: Two times a day (BID) | ORAL | 0 refills | Status: DC
Start: 2023-06-07 — End: 2023-06-07
  Administered 2023-06-07: 09:00:00 2.5 mg via ORAL

## 2023-06-07 MED ORDER — SENNOSIDES-DOCUSATE SODIUM 8.6-50 MG PO TAB
1 | Freq: Every day | ORAL | 0 refills | Status: DC | PRN
Start: 2023-06-07 — End: 2023-06-17

## 2023-06-07 MED ORDER — PIPERACILLIN/TAZOBACTAM 4.5 G/100ML NS IVPB (MB+)
4.5 g | INTRAVENOUS | 0 refills | Status: DC
Start: 2023-06-07 — End: 2023-06-10
  Administered 2023-06-07 – 2023-06-10 (×26): 4.5 g via INTRAVENOUS

## 2023-06-07 MED ORDER — POLYETHYLENE GLYCOL 3350 17 GRAM PO PWPK
1 | Freq: Every day | ORAL | 0 refills | Status: DC | PRN
Start: 2023-06-07 — End: 2023-06-17

## 2023-06-07 NOTE — Case Management (ED)
 Case Management Admission Assessment    NAME:Javier Gutierrez                          MRN: [id number]             DOB:03-Dec-1940          AGE: 83 y.o.  ADMISSION DATE: 06/06/2023             DAYS ADMITTED: LOS: 1 day      Today?s Date: 06/07/2023    Per emr, Javier Gutierrez is a 84 y.o. man with a pertinent PMH of COPD, HFrEF, afib, hypothyroidism who was admitted on 06/06/2023 for shortness of breath with multifocal pneumonia.     Source of Information: emr and pt dtr Javier Gutierrez at the bedside       Plan  Plan: Case Management Assessment, Assist PRN with SW/NCM Services, Discharge Planning for Home with Post-Acute Care Needs    Spoke to patient and dtr and explained role in r/t d/c planning and provided contact information.  Reviewed [facility], Preparing for Discharge, and Preferred Provider Network hand-outs. Provided opportunity for questions and discussion. Encouraged to contact Case Management team with questions and concerns during hospitalization if any assist is needed.    Pt lives with his dtr for the last 5 yrs at [phone] [address] in [place]. Pt still has his apt in [place] and this is the address on file. Pt requires SBA for all cares. Pt was able to tell ncm the year was 2025. Pt has someone with him 24/7 at home. His dtr works from home for [employer].   Pt is current with [facility] for Rn and PT. They will need Roc orders at time of d/c. Sent clinical update today.   Pt has been to Inpt rehab and SNF in the past. Pt and is hoping to return to his dtrs home at time of d/c.   Dtr asking for gerontology consult. Passed information to attending  Plan:pneumonia f/u and pt on IVabx zosyn currently. SLP, PT/OT  Case management to continue to follow to assist with d/c needs. Pt is anticipated to remain inpt over the w/e.   Patient Address/Phone  [address]  [place] [address]  [phone] (home)     Emergency Contact  Extended Emergency Contact Information  Primary Emergency Contact: Javier Gutierrez  Address: [phone] [address]           [place], [place] [address] [place]   Home Phone: [phone]  Mobile Phone: [phone]  Relation: Daughter  Preferred language: ENGLISH  Interpreter needed? No  Secondary Emergency Contact: Javier Gutierrez   [place]   Home Phone: [phone]  Mobile Phone: [phone]  Relation: Son  Preferred language: ENGLISH  Interpreter needed? No    Forensic scientist: Yes, patient has a healthcare directive  Type of Healthcare Directive: Durable power of attorney for healthcare, Healthcare directive  Location of Healthcare Directive: Current and verified in document scanning system (pt dtr Javier Gutierrez is DPOA and her brother is secondary)  Would patient like to fill out a (a new) Editor, commissioning?: N/A  Psych Advance Directive (Psych unit only): No, patient does not have a Social research officer, government  Does the Patient Need Case Management to Arrange Discharge Transport? (ex: facility, ambulance, wheelchair/stretcher, Medicaid, cab, other): No  Will the Patient Use Family Transport?: Yes  Transportation Name, Phone and Availability #  1: dtr Javier Gutierrez [phone]    Expected Discharge Date  06/10/2023     Living Situation Prior to Admission  Living Arrangements  Type of Residence: Home, dependent on others  Living Arrangements: Children  Financial risk analyst / Tub: Tub/Shower Unit  How many levels in the residence?: 1  Can patient live on one level if needed?: Yes  Does residence have entry and/or inside stairs?: No  Assistance needed prior to admit or anticipated on discharge: Yes  Who provides assistance or could if needed?: dtr and son in law  Are they in good health?: Yes  Can support system provide 24/7 care if needed?: Yes  Level of Function   Prior level of function: Needs assist with ADLs  Which ADLs require assistance?: homemaking, SBA for all needs  Who assists with ADLs?: dtr primarily  Cognitive Abilities   Cognitive Abilities: Continue to Assess    Financial Resources  Coverage  Primary Insurance: Medicare  Secondary Insurance: No insurance  Medication Coverage    Medication Coverage: Medicare Part D  Medicare Part D Plan: humana  Have you experienced a noticeable increase in your copay costs recently?: No  Are current medications affordable?: Yes  Do You Use a Co-Pay Card or a Medication Assistance Program to Help Manage Medication Costs?: No  Do You Manage Your Own Medications?: No  Who is Responsible for Ordering and Setting up Medications?: dtr Javier Gutierrez  Source of Income   Source Of Income: Other retirement income, SSI  Financial Assistance Needed?  No reported concerns    Psychosocial Needs  Mental Health  Mental Health History: No  Substance Use History   Former smoker, quit 2020.   Other  na    Current/Previous Services  PCP  Javier Gutierrez, [phone], [phone]  Pharmacy    [facility] - [place], [place] - [address]  [phone]  Javier Gutierrez  [id number]  Phone: [phone] Fax: [phone]    Durable Medical Equipment   Durable Medical Equipment at home: Oxygen, Single [address], Grab bars, Rollator, Wheelchair (manual) (unknown home 02 provider. dtr said she can get this information if needed)  Home Health  Receiving home health: Yes  Agency name: [name]  Would patient use this agency again?: Yes  Hemodialysis or Peritoneal Dialysis  Undergoing hemodialysis or peritoneal dialysis: No  Tube/Enteral Feeds  Receive tube/enteral feeds: No  Infusion  Receive infusions: No  Private Duty  Private duty help used: No  Home and Community Based Services  Home and community based services: No  [name]  [name]: N/A  Hospice  Hospice: No  Outpatient Therapy  PT: No  OT: No  SLP: No  Skilled Nursing Facility/Nursing Home  SNF: In the past  Name of Facility: [name]  Would patient return for future services?: Yes  NH: No  Inpatient Rehab  IPR: In the past  Name of Facility: [name] twice following stroke and another time following recovery from a serious infection  Would patient return for future services?: Yes  Long-Term Acute Care Hospital  LTACH: No  Acute Hospital Stay  Acute Hospital Stay: In the past  Was patient's stay within the last 30 days?: No      Ceclia Cohens Integrated Nurse Case Manager [facility]  Ph [phone]  Available on Voalte

## 2023-06-07 NOTE — ED Notes
 Attempted report x1.

## 2023-06-07 NOTE — Progress Notes
 SPEECH-LANGUAGE PATHOLOGY  CLINICAL SWALLOW ASSESSMENT     Name: Javier Gutierrez   MRN: [id number]     DOB: 1940/12/29      Age: 83 y.o.  Admission Date: 06/06/2023     LOS: 1 day     Date of Service: 06/07/2023    Evaluation Summary   Clinical swallow evaluation completed.   Pt known to this department. Pt's daughter present for evaluation and providing history. Pt with baseline dysphagia 2/2 CVA in 202 and d/c with regular solids and thin liquid diet. Per pt's daughter, pt mainly eats pureed/minced and moist solids at home. Pt known to be at risk for SILENT aspiration with mildly thick liquids, therefore thin liquids have been recommended in the past as pt is able to clear aspirate more frequently.   Pt works with a HH SLP. No history of pneumonia following most recent instrumental evaluation until this past month. Pt's daughter reported completing thorough oral care with pt both before and after meals. Pt observed to cough x1 with thin liquids during evaluation this date. Discussed completing an instrumental evaluation of swallowing during pt's stay 2/2 recent pneumonia; pt and pt's daughter agreeable. Pt continues to report not wishing for a feeding tube. Discussed recommendations below with RN and provider.   Clinical Impression: Mild to moderate dysphagia  Sources of Dysphagia: Baseline dysphagia (hx of CVA), COPD    Swallow Recommendations  PO: Minced and moist, Thin liquids (w/potential risk of aspiration)  Medications: 1 at a time, As tolerated  Supervision: 1:1  Positioning: Upright 90 degrees or chair mode  Swallow Strategies: 100% supervision to provide cues  for swallow strategies during meals, Small bites/sips, Slow rate of intake, Limit distractions during PO  Oral Hygiene:  Complete oral care BID  Plan to complete video swallow study 5/16 as schedule allows    PO Presentation  Presentations: Patient Fed Self  Thin Liquid: 1 Tsp, Cup, Straw, Consecutive Swallows  Other Consistencies: Puree, Minced and moist    Clinical Interpretation of Oral Stage  Withdraw Bolus: WFL  Form Bolus: WFL  Masticate Bolus: WFL  Transfer Bolus: Suspect early spillover  Anterior Bolus Spillage: None  Oral Residue: None    Clinical Interpretation of Pharyngeal Stage  Laryngeal Elevation:  (present)  Signs / Symptoms Of Aspiration: Thin liquid  Thin Liquid: Cough  Suspected Pharyngeal Stage Impairment: Mistimed airway protection, Incomplete pharyngeal clearance  Pharyngeal Stage Summary: x1 cough following x1 trial of thin liquids this date    Oral Mech Exam  Oral Mech WFL: Yes (Of note, pt wears top and bottom dentures.)    Attempted Swallow Strategies  100% Supervision to Provide Cues for Swallow Strategies During Meals: Effective  Limit Distractions During PO: Effective  Small Bites/Sips:  (mostly effective)  Slow Rate of Intake:  (mostly effective)    Subjective  Reason for Admission and Past Medical Hx: Javier Gutierrez is a 83 y.o. man with a pertinent PMH of COPD, HFrEF, afib, hypothyroidism who was admitted on 06/06/2023 for shortness of breath with multifocal pneumonia. Patient reports he has had worsening shortness of breath over the past few days with a productive cough with yellow sputum. Daughter notes his oxygen sats were in the mid 80s at home prompting them to come in. He had recently finished treatment for pseudomonal pneumonia on 5/12 with levofloxacin [facility]. Daughter notes he had been improving but then over the past two days had worsening shortness of breath. Denies any fevers/chills  at home. Prior to the admission at [facility] patient had rhinovirus and pneumonia April 2025. He also received a course of prednisone at [facility] for COPD exacerbation and is finishing a steroid taper. CTA chest completed in the ED showed bibasilar clustered nodular opacities consistent with multifocal pneumonia. Daughter does note history of aspiration events and concern for further aspiration. Patient was started on zosyn and admitted for IV antibiotics.  Mental / Cognitive: Alert, Cooperative, Follows commands  Persons Present: Daughter    Imaging  CTA CHEST PULM EMBOLSIM W/CONT IMPRESSION 06/06/23:  1.  No evidence of pulmonary embolism.   2.  Increase in posterior left lower lobe consolidation and development of bibasilar fine clustered nodular opacities, most with multifocal pneumonia, likely on a basis of aspiration.   3.  Unchanged multifocal central and peripheral bronchiectasis, likely sequela of chronic infection or aspiration.     Nutrition  Nutrition Prior To Hospitalization: Oral, Minced and moist, Thin Liquids  Current Form Of Nutrition: Oral, Regular, Thin Liquids    Education  Persons Educated: Pt/Family  Barriers To Learning: Cognitive Deficits  Interventions: Family Educated, Haematologist Educated  Teaching Methods: Industrial/product designer, Demonstration  Topics: Dysphagia  Patient Response: Verbalized Understanding  Goal Formulation: With Pt/Family    Assessment/Prognosis  Plan: 2-3 x/week     NOMS Dysphagia Rating: 4-Mild-Moderate Dysphagia -Swallow safe but usually requires mod cues to use compensatory strategies &/or has mod diet restrictions &/or still requires tube feeding &/or oral supplements.    Clinical Swallow Goals  Goal : Pt will participate in an instrumental evaluation of swallowing, as indicated, given min cues for participation.    Therapist: Jamion Carter, MS, CCC-SLP   Date:06/07/2023

## 2023-06-07 NOTE — Progress Notes
 RT Adult Assessment Note    NAME:Javier Gutierrez             MRN: [id number]             DOB:07-10-1940          AGE: 83 y.o.  ADMISSION DATE: 06/06/2023             DAYS ADMITTED: LOS: 1 day    Additional Comments:  Impressions of the patient: Pt respiratory status apears stable at this time  Intervention(s)/outcome(s): RT eval  Patient education that was completed: none  Recommendations to the care team: none    Vital Signs:  Pulse: 83  RR: 15 PER MINUTE  SpO2: 93 %  O2 Device: None (Room air)  Liter Flow:    O2%:      Breath Sounds:   All Breath Sounds: Coarse crackles  Respiratory Effort:   Respiratory Effort/Pattern: Unlabored;Shallow  Comments:

## 2023-06-07 NOTE — Progress Notes
 General Progress Note      Admission Date: 06/06/2023  LOS: 1 day                     Assessment/Plan:    Principal Problem:    Hypoxia    83 y/o M known COPD, HFrEF and A-fib who presents with progressive shortness of breath and hypoxia    #Multifocal Pneumonia w/ concern for aspiration  #Shortness of Breath  - CTA chest on admission with LLL consolidation and development of bibasilar nodular opacities consistent with multifocal pneumonia and cental and peripheral bronchiectasis  - patient recently admitted at [facility] for pneumonia and treated with zosyn followed by a week long course of Levaquin through 5/12, daughter reports breathing again worsened over the past few days  - reports history of aspiration  - MRSA nares negative   PLAN:  > continue zosyn for pneumonia  > follow blood cx  > RVP     #COPD  - patient recently treated for COPD exacerbation and reports he is finishing a steroid taper at home  PLAN:  > continue PTA budesonide and albuterol PRN  > continue PTA montelukast  > continue PTA steroid taper with prednisone from outpt     #HFrEF  #PAD s/p stent  #Atrial fibrillation  - TTE 3/7: LVEF 45%. Segmental WMA. Mild LV diastolic dysfunction. Trace MR.  - BNP elevated on admission to 2297, patient appears euvolemic on exam and is at his dry weight of 164lbs  - hs troponin mildly elevated in ED 36->40->43, patient denies any chest pain  - EKG in ED with sinus tach and bifascicular block, noted on prior EKGs  PLAN:  > continue PTA statin and plavix  > continue PTA apixaban  > continue PTA metoprolol  > will hold off on diuretics at this time as patient appears euvolemic  > trend trops to peak     #Hypothyroidism  > continue PTA levothyroxine     Diet: Diet Cardiac (Low Fat/Low Sodium)  VTE ppx: Continue PTA apixiban  CODE: Full Code     DISPO: Maintain inpt   High medical decision making due to the following:  1 acute or chronic illness that poses a threat to life or bodily function  Review of notes outside of my specialty, Review of each unique test, and Ordering of each unique test  ____________________________________________________________________________________________________________________________________    Subjective  Patient still experiencing intermittent shortness of breath and cough no overnight fevers documented since admission    10 point review of systems not obtainable with patient significant expressive aphasia    Medications  Scheduled Meds:albuterol-ipratropium (DUONEB) nebulizer solution 3 mL, 3 mL, Inhalation, QID & PRN  apixaban (ELIQUIS) tablet 2.5 mg, 2.5 mg, Oral, BID  atorvastatin (LIPITOR) tablet 20 mg, 20 mg, Oral, QDAY  budesonide (PULMICORT) nebulizer solution 0.5 mg, 0.5 mg, Inhalation, BID  clopiDOGreL (PLAVIX) tablet 75 mg, 75 mg, Oral, QDAY  levothyroxine (SYNTHROID) tablet 88 mcg, 88 mcg, Oral, QDAY 30 min before breakfast  metoprolol succinate XL (TOPROL XL) tablet 12.5 mg, 12.5 mg, Oral, QDAY  montelukast (SINGULAIR) tablet 10 mg, 10 mg, Oral, QHS  pantoprazole DR (PROTONIX) tablet 40 mg, 40 mg, Oral, QDAY  piperacillin/tazobactam (ZOSYN) 4.5 g in sodium chloride 0.9% (NS) 100 mL IVPB (MB+), 4.5 g, Intravenous, Q6H*  [START ON 06/08/2023] predniSONE (DELTASONE) tablet 10 mg, 10 mg, Oral, QDAY  predniSONE (ORASONE) tablet 15 mg, 15 mg, Oral, ONCE    Continuous Infusions:  PRN and  Respiratory Meds:albuterol 0.083% QID PRN, melatonin QHS PRN, ondansetron Q6H PRN **OR** ondansetron (ZOFRAN) IV Q6H PRN, polyethylene glycol 3350 QDAY PRN, sennosides-docusate sodium QDAY PRN      Objective                       Vital Signs: Last Filed                 Vital Signs: 24 Hour Range   BP: 177/84 (05/16 1030)  Temp: 37.1 ?C (98.8 ?F) (05/15 1442)  Pulse: 83 (05/16 1030)  Respirations: 16 PER MINUTE (05/16 1030)  SpO2: 94 % (05/16 1030)  O2 Device: None (Room air) (05/16 0909)  O2 Liter Flow: 2 Lpm (05/15 1638) BP: (140-184)/(84-101)   Temp:  [37.1 ?C (98.8 ?F)]   Pulse:  [80-125] Respirations:  [12 PER MINUTE-24 PER MINUTE]   SpO2:  [91 %-97 %]   O2 Device: None (Room air)  O2 Liter Flow: 2 Lpm   Intensity Pain Scale (Self Report): 0 (06/06/23 1438) Vitals:    06/06/23 1442   Weight: 72.6 kg (160 lb)       Intake/Output Summary:  (Last 24 hours)    Intake/Output Summary (Last 24 hours) at 06/07/2023 1259  Last data filed at 06/07/2023 0500  Gross per 24 hour   Intake 100 ml   Output --   Net 100 ml           Physical Exam  General appearance: fatigued, poor nutritional state, and no distress  Neurologic:  Conscious significant expressive aphasia and right-sided weakness  Lungs: clear to auscultation bilaterally  Heart: regular rate and rhythm, S1, S2 normal, no murmur, click, rub or gallop  Abdomen: soft, non-tender. Bowel sounds normal. No masses,  no organomegaly  Extremities: extremities normal, atraumatic, no cyanosis or edema     Lab Review  CBC w/Diff    Lab Results   Component Value Date/Time    WBC 9.60 06/07/2023 03:54 AM    RBC 4.20 (L) 06/07/2023 03:54 AM    HGB 10.7 (L) 06/07/2023 03:54 AM    HCT 34.0 (L) 06/07/2023 03:54 AM    MCV 80.8 06/07/2023 03:54 AM    MCH 25.4 (L) 06/07/2023 03:54 AM    MCHC 31.4 (L) 06/07/2023 03:54 AM    RDW 18.2 (H) 06/07/2023 03:54 AM    PLTCT 113 (L) 06/07/2023 03:54 AM    MPV 8.6 06/07/2023 03:54 AM    Lab Results   Component Value Date/Time    NEUT 92.0 (H) 06/06/2023 04:46 PM    ANC 9.90 (H) 06/06/2023 04:46 PM    LYMA 3.2 (L) 06/06/2023 04:46 PM    ALC 0.30 (L) 06/06/2023 04:46 PM    MONA 4.2 06/06/2023 04:46 PM    AMC 0.40 06/06/2023 04:46 PM    EOSA 0.0 06/06/2023 04:46 PM    AEC 0.00 06/06/2023 04:46 PM    BASA 0.6 06/06/2023 04:46 PM    ABC 0.10 06/06/2023 04:46 PM        Comprehensive Metabolic Profile    Lab Results   Component Value Date/Time    NA 137 06/07/2023 03:54 AM    K 3.3 (L) 06/07/2023 03:54 AM    CL 102 06/07/2023 03:54 AM    CO2 26 06/07/2023 03:54 AM    GAP 9 06/07/2023 03:54 AM    BUN 30 (H) 06/07/2023 03:54 AM    CR 1.07 06/07/2023 03:54 AM    GLU 87 06/07/2023 03:54  AM    Lab Results   Component Value Date/Time    CA 7.8 (L) 06/07/2023 03:54 AM    PO4 2.1 03/12/2023 05:49 AM    ALBUMIN 2.8 (L) 06/07/2023 03:54 AM    TOTPROT 5.1 (L) 06/07/2023 03:54 AM    ALKPHOS 42 06/07/2023 03:54 AM    AST 11 06/07/2023 03:54 AM    ALT 13 06/07/2023 03:54 AM    TOTBILI 0.9 06/07/2023 03:54 AM    GFR >60 06/07/2023 03:54 AM    GFRAA 48 (L) 06/29/2018 06:53 AM          Point of Care Testing  (Last 24 hours)  Glucose: 87    Radiology and other Diagnostics Review:  Pertinent radiology reviewed     Charlena Conner, MD  FACP  Med Private L  Pg [phone]

## 2023-06-07 NOTE — Care Coordination-Inpatient
Med Teaching TBD to continue to take calls on this patient until 8:00 am. After 8:00 am please page Med L

## 2023-06-08 VITALS — BP 147/62 | HR 85 | Temp 97.60000°F

## 2023-06-08 VITALS — BP 181/87

## 2023-06-08 VITALS — BP 190/91

## 2023-06-08 VITALS — BP 161/69 | HR 86 | Temp 97.50000°F

## 2023-06-08 VITALS — HR 89

## 2023-06-08 VITALS — BP 190/80 | HR 83 | Temp 97.60000°F

## 2023-06-08 VITALS — HR 82

## 2023-06-08 VITALS — BP 195/92 | HR 88 | Temp 97.40000°F

## 2023-06-08 VITALS — HR 79

## 2023-06-08 VITALS — BP 150/77 | HR 91 | Temp 98.10000°F

## 2023-06-08 VITALS — BP 160/65 | HR 82 | Temp 97.60000°F

## 2023-06-08 VITALS — HR 78

## 2023-06-08 LAB — COMPREHENSIVE METABOLIC PANEL
~~LOC~~ BKR BLD UREA NITROGEN: 24 mg/dL — ABNORMAL LOW (ref 7–25)
~~LOC~~ BKR CHLORIDE: 102 mmol/L (ref 98–110)
~~LOC~~ BKR CREATININE: 1.3 mg/dL — ABNORMAL HIGH (ref 0.40–1.24)
~~LOC~~ BKR POTASSIUM: 4.2 mmol/L (ref 3.5–5.1)
~~LOC~~ BKR SODIUM, SERUM: 140 mmol/L (ref 137–147)

## 2023-06-08 LAB — BASIC METABOLIC PANEL
~~LOC~~ BKR ANION GAP: 12 (ref 3–12)
~~LOC~~ BKR BLD UREA NITROGEN: 27 mg/dL — ABNORMAL HIGH (ref 7–25)
~~LOC~~ BKR CALCIUM: 8 mg/dL — ABNORMAL LOW (ref 8.5–10.6)
~~LOC~~ BKR CHLORIDE: 102 mmol/L (ref 98–110)
~~LOC~~ BKR CO2: 24 mmol/L (ref 21–30)
~~LOC~~ BKR CREATININE: 1.3 mg/dL — ABNORMAL HIGH (ref 0.40–1.24)
~~LOC~~ BKR GLOMERULAR FILTRATION RATE (GFR): 54 mL/min — ABNORMAL LOW (ref >60)
~~LOC~~ BKR GLUCOSE, RANDOM: 144 mg/dL — ABNORMAL HIGH (ref 70–100)
~~LOC~~ BKR POTASSIUM: 3.7 mmol/L (ref 3.5–5.1)
~~LOC~~ BKR SODIUM, SERUM: 138 mmol/L (ref 137–147)

## 2023-06-08 LAB — CBC: ~~LOC~~ BKR RBC COUNT: 4.8 10*6/uL — ABNORMAL HIGH (ref 4.40–5.50)

## 2023-06-08 LAB — LEGIONELLA ANTIGEN URINE,RAN: ~~LOC~~ BKR LEGIONELLA URINE AG: NEGATIVE

## 2023-06-08 MED ORDER — METOPROLOL SUCCINATE 25 MG PO TB24
12.5 mg | Freq: Once | ORAL | 0 refills | Status: CP
Start: 2023-06-08 — End: ?
  Administered 2023-06-08: 23:00:00 12.5 mg via ORAL

## 2023-06-08 MED ORDER — GUAIFENESIN 600 MG PO TA12
600 mg | Freq: Two times a day (BID) | ORAL | 0 refills | Status: DC
Start: 2023-06-08 — End: 2023-06-17
  Administered 2023-06-08 – 2023-06-17 (×17): 600 mg via ORAL

## 2023-06-08 MED ORDER — METOPROLOL SUCCINATE 25 MG PO TB24
25 mg | Freq: Every day | ORAL | 0 refills | Status: DC
Start: 2023-06-08 — End: 2023-06-17
  Administered 2023-06-09 – 2023-06-17 (×7): 25 mg via ORAL

## 2023-06-08 MED ORDER — HYDRALAZINE 25 MG PO TAB
25 mg | Freq: Once | ORAL | 0 refills | Status: CP
Start: 2023-06-08 — End: ?
  Administered 2023-06-08: 13:00:00 25 mg via ORAL

## 2023-06-08 MED ORDER — LABETALOL 5 MG/ML IV SOLN
5 mg | INTRAVENOUS | 0 refills | Status: DC | PRN
Start: 2023-06-08 — End: 2023-06-11
  Administered 2023-06-08 – 2023-06-10 (×4): 5 mg via INTRAVENOUS

## 2023-06-08 MED ORDER — SODIUM CHLORIDE 3 % IN NEBU
4 mL | Freq: Two times a day (BID) | RESPIRATORY_TRACT | 0 refills | Status: DC
Start: 2023-06-08 — End: 2023-06-17
  Administered 2023-06-08 – 2023-06-17 (×17): 4 mL via RESPIRATORY_TRACT

## 2023-06-08 MED ORDER — MAGNESIUM SULFATE IN D5W 1 GRAM/100 ML IV PGBK
1 g | INTRAVENOUS | 0 refills | Status: CP
Start: 2023-06-08 — End: ?
  Administered 2023-06-08: 07:00:00 1 g via INTRAVENOUS

## 2023-06-08 MED ORDER — POTASSIUM CHLORIDE 20 MEQ PO TBTQ
40 meq | Freq: Once | ORAL | 0 refills | Status: CP
Start: 2023-06-08 — End: ?
  Administered 2023-06-08: 07:00:00 40 meq via ORAL

## 2023-06-08 NOTE — Progress Notes
 06/08/23 0627   Vitals   BP (!) 190/80  (  Notfifed Rn )   Mean NBP (Calculated) (!) 117 MM HG     Provider [facility] notified. New orders for PRN IV labetalol (see mar).

## 2023-06-08 NOTE — Progress Notes
 General Progress Note      Admission Date: 06/06/2023  LOS: 2 days                     Assessment/Plan:    Principal Problem:    Hypoxia    Javier Gutierrez is an 83 y/o M with PMH of known asthma/COPD, HFrEF, A-fib (on Eliquis), HTN, PAD, CVA with expressive aphasia, who presents with progressive shortness of breath and hypoxia    Left lower lobe pneumonia  RLL mucus plugging 2/2 aspiration  + Rhinovirus/enterovirus  Chronic bronchiectasis  Moderate dysphagia  - recent diagnosis of pseudomonal pneumonia in April 2025  - patient recently admitted at [facility] for pneumonia and treated with zosyn followed by a week long course of Levaquin through 5/12, daughter reports breathing again worsened a few days prior to arrival   - CTA chest on admission with LLL consolidation and development of bibasilar nodular opacities consistent with multifocal pneumonia and cental and peripheral bronchiectasis  - reports history of aspiration  - MRSA nares negative   - RVP + rhinovirus/enterovirus  - evaluated by SLP on 5/16 with concerns for moderate dysphagia  Plan:   - consult to pulmonary, appreciate recs  - continue IV Zosyn  - sputum culture ordered  - urine legionella and histoplasma Ag ordered  - Mucinex BID, DuoNebs Q4H and PRN, sodium chloride nebulizer BID  - follow blood cultures (5/15)     History of asthma vs COPD with recent exacerbation  - patient recently treated for COPD exacerbation and has been on steroid taper  - continue PTA budesonide and albuterol PRN  - continue PTA montelukast  - continue PTA steroid taper with prednisone  - has outpatient pulmonary establish care appt arranged with plan for outpatient PFTs in August 2025    Hypertension, uncontrolled  Chronic systolic heart failure  PAD s/p stent  CVA with expressive aphasia  Atrial fibrillation  - TTE 3/7: LVEF 45%. Segmental WMA. Mild LV diastolic dysfunction. Trace MR.  - BNP elevated on admission to 2297, patient appears euvolemic on exam and is at his dry weight of 164lbs  - hs troponin mildly elevated in ED 36->40->43, patient denies any chest pain  - EKG in ED with sinus tach and bifascicular block, noted on prior EKGs  Plan:   - as outpatient can discuss with cardiologist regarding increase in dose of apixaban (to 5mg  BID rather than 2.5mg  BID) as currently no clear indication for reduced dose  - cont PTA statin, Plavix  - cont PTA metoprolol succinate 12.5mg  daily > will up-titrate to 25mg  beginning 5/18  - takes furosemide 20mg  daily PRN    CKD3  - Cr at baseline  - avoid nephrotoxic agents     Hypothyroidism: PTA levothyroxine     Diet: Diet Cardiac (Low Fat/Low Sodium)  VTE ppx: Eliquis  CODE: Full Code  DISPO: continue inpatient admission for pneumonia.    High medical decision making due to the following:  1 acute or chronic illness that poses a threat to life or bodily function  Review of notes outside of my specialty, Review of each unique test, and Ordering of each unique test      Javier Keeler, MD  Internal Medicine Hospitalist  [facility]  __________________________________________________________________________________________________________________________________    Subjective    Continues to have SOA with congestion, but is not coughing as much. Spoke in length to patient's daughter Javier Gutierrez who was at bedside. She reports that pt  overall has been dealing with respiratory symptoms for almost 1 month now. She is worried that the pseudomonas he had initially was never completely treated. He otherwise has been alert and oriented - she reports he has expressive aphasia from prior CVA    Medications  Scheduled Meds:albuterol-ipratropium (DUONEB) nebulizer solution 3 mL, 3 mL, Inhalation, QID & PRN  apixaban (ELIQUIS) tablet 2.5 mg, 2.5 mg, Oral, BID  atorvastatin (LIPITOR) tablet 20 mg, 20 mg, Oral, QDAY  budesonide (PULMICORT) nebulizer solution 0.5 mg, 0.5 mg, Inhalation, BID  clopiDOGreL (PLAVIX) tablet 75 mg, 75 mg, Oral, QDAY  levothyroxine (SYNTHROID) tablet 88 mcg, 88 mcg, Oral, QDAY 30 min before breakfast  metoprolol succinate XL (TOPROL XL) tablet 12.5 mg, 12.5 mg, Oral, QDAY  montelukast (SINGULAIR) tablet 10 mg, 10 mg, Oral, QHS  pantoprazole DR (PROTONIX) tablet 40 mg, 40 mg, Oral, QDAY  piperacillin/tazobactam (ZOSYN) 4.5 g in sodium chloride 0.9% (NS) 100 mL IVPB (MB+), 4.5 g, Intravenous, Q6H*  predniSONE (DELTASONE) tablet 10 mg, 10 mg, Oral, QDAY    Continuous Infusions:  PRN and Respiratory Meds:albuterol 0.083% QID PRN, labetalol (NORMODYNE; TRANDATE) injection Q6H PRN, melatonin QHS PRN, nystatin BID PRN, ondansetron Q6H PRN **OR** ondansetron (ZOFRAN) IV Q6H PRN, polyethylene glycol 3350 QDAY PRN, sennosides-docusate sodium QDAY PRN      Objective                       Vital Signs: Last Filed                 Vital Signs: 24 Hour Range   BP: 190/91 (05/17 0735)  Temp: 36.4 ?C (97.6 ?F) (05/17 1610)  Pulse: 78 (05/17 0902)  Respirations: 18 PER MINUTE (05/17 0627)  SpO2: 92 % (05/17 0851)  O2 Device: None (Room air) (05/17 0851)  Height: 177.8 cm (5' 10) (05/16 1445) BP: (150-190)/(77-91)   Temp:  [36.4 ?C (97.6 ?F)-36.7 ?C (98.1 ?F)]   Pulse:  [78-108]   Respirations:  [16 PER MINUTE-21 PER MINUTE]   SpO2:  [92 %-99 %]   O2 Device: None (Room air)     Vitals:    06/06/23 1442 06/07/23 1445   Weight: 72.6 kg (160 lb) 70 kg (154 lb 5.2 oz)       Intake/Output Summary:  (Last 24 hours)    Intake/Output Summary (Last 24 hours) at 06/08/2023 1004  Last data filed at 06/08/2023 0445  Gross per 24 hour   Intake 20 ml   Output 0 ml   Net 20 ml           Physical Exam   General: no acute distress, old, frail.   HEENT: mucus membranes appear moist.   Cardiovascular: regular rate, regular rhythm.   Lungs: clear to auscultation bilaterally, on room air.   Abdomen: soft, non-tender.   Extremities: no deformity, no edema.   Neuro: alert and oriented but sometimes slow to respond. no focal neurologic deficit.   Skin: no rash or lesion on exposed areas.   Psychiatry: calm, cooperative.     Lab Review  CBC w/Diff    Lab Results   Component Value Date/Time    WBC 9.60 06/07/2023 03:54 AM    RBC 4.20 (L) 06/07/2023 03:54 AM    HGB 10.7 (L) 06/07/2023 03:54 AM    HCT 34.0 (L) 06/07/2023 03:54 AM    MCV 80.8 06/07/2023 03:54 AM    MCH 25.4 (L) 06/07/2023 03:54 AM    MCHC 31.4 (L)  06/07/2023 03:54 AM    RDW 18.2 (H) 06/07/2023 03:54 AM    PLTCT 113 (L) 06/07/2023 03:54 AM    MPV 8.6 06/07/2023 03:54 AM    Lab Results   Component Value Date/Time    NEUT 92.0 (H) 06/06/2023 04:46 PM    ANC 9.90 (H) 06/06/2023 04:46 PM    LYMA 3.2 (L) 06/06/2023 04:46 PM    ALC 0.30 (L) 06/06/2023 04:46 PM    MONA 4.2 06/06/2023 04:46 PM    AMC 0.40 06/06/2023 04:46 PM    EOSA 0.0 06/06/2023 04:46 PM    AEC 0.00 06/06/2023 04:46 PM    BASA 0.6 06/06/2023 04:46 PM    ABC 0.10 06/06/2023 04:46 PM        Comprehensive Metabolic Profile    Lab Results   Component Value Date/Time    NA 138 06/08/2023 12:20 AM    K 3.7 06/08/2023 12:20 AM    CL 102 06/08/2023 12:20 AM    CO2 24 06/08/2023 12:20 AM    GAP 12 06/08/2023 12:20 AM    BUN 27 (H) 06/08/2023 12:20 AM    CR 1.31 (H) 06/08/2023 12:20 AM    GLU 144 (H) 06/08/2023 12:20 AM    Lab Results   Component Value Date/Time    CA 8.0 (L) 06/08/2023 12:20 AM    PO4 2.1 03/12/2023 05:49 AM    ALBUMIN 2.8 (L) 06/07/2023 03:54 AM    TOTPROT 5.1 (L) 06/07/2023 03:54 AM    ALKPHOS 42 06/07/2023 03:54 AM    AST 11 06/07/2023 03:54 AM    ALT 13 06/07/2023 03:54 AM    TOTBILI 0.9 06/07/2023 03:54 AM    GFR 54 (L) 06/08/2023 12:20 AM    GFRAA 48 (L) 06/29/2018 06:53 AM          Point of Care Testing  (Last 24 hours)  Glucose: (!) 144    Radiology and other Diagnostics Review:  Pertinent radiology reviewed     Ines Mane, MD

## 2023-06-08 NOTE — Progress Notes
 END OF SHIFT/PLAN OF CARE NURSING NOTE    Admission Date: 06/06/2023  Length of Stay: LOS: 2 days    Acute events, pain management, and nursing interventions: no pain reported overnight. Notified [facility] of pt's changes on tele, received orders to collect labs and replace Magnesium and Potassium.    Communication with providers: Carley Cheese (see above)    Intake and Output:        Intake/Output Summary (Last 24 hours) at 06/08/2023 0519  Last data filed at 06/08/2023 0020  Gross per 24 hour   Intake 20 ml   Output 0 ml   Net 20 ml        Last Bowel Movement Date:  (PTA)    Fall Risk/JHFRAT Interventions and Education:   Elimination: toilet  Medications: 2 or more  Bed/chair alarm: on  Patient Care Equipment: gait belt, walker  Mobility: Uw2  Cognition: Aox3-4    Patient Education  Patient-specific education provided related to quality/safety:  Fall risk and Pressure injury care/prevention  Patient-specific education provided (other): medications  Learners: Patient  Method/materials used: Therapist, art  Response to learning: [employer] Understanding  Needs reinforcement on: n/a    Patient Goal(s):  Patient will Maintain optimal gas exchange  by discharge date         Patient will  Verbalize readiness for discharge by discharge date   Other:    Restraints:  No  Restraint Goal: Patient will be free from injury while physically restrained.  See Docflowsheet for restraint documentation, interventions, education, etc.

## 2023-06-09 ENCOUNTER — Inpatient Hospital Stay: Admit: 2023-06-09 | Discharge: 2023-06-09 | Payer: MEDICARE

## 2023-06-09 VITALS — BP 157/73 | HR 99

## 2023-06-09 VITALS — BP 165/82 | HR 77 | Temp 97.70000°F

## 2023-06-09 VITALS — BP 137/71 | HR 105 | Temp 97.60000°F

## 2023-06-09 VITALS — BP 151/65 | HR 86 | Temp 98.20000°F

## 2023-06-09 VITALS — HR 103

## 2023-06-09 VITALS — BP 162/82 | HR 89 | Temp 98.00000°F

## 2023-06-09 VITALS — BP 152/101

## 2023-06-09 VITALS — BP 205/97 | HR 81 | Temp 97.80000°F

## 2023-06-09 VITALS — HR 99

## 2023-06-09 VITALS — HR 84

## 2023-06-09 VITALS — BP 201/85 | HR 88 | Temp 98.00000°F

## 2023-06-09 VITALS — HR 102

## 2023-06-09 MED ORDER — LISINOPRIL 2.5 MG PO TAB
2.5 mg | Freq: Two times a day (BID) | ORAL | 0 refills | Status: DC
Start: 2023-06-09 — End: 2023-06-17
  Administered 2023-06-10 – 2023-06-16 (×11): 2.5 mg via ORAL

## 2023-06-09 NOTE — Progress Notes
 06/09/23 1744   Vitals   Pulse 99   Respirations 18 PER MINUTE   SpO2 99 %   O2 Device None (Room air)   BP (!) 157/73   Mean NBP (Calculated) 101 MM HG   NBP MAP (Manual Entry) 89 mm Hg   BP Source Arm, Right Upper   BP Method Automatic   BP Patient Position Chair     Pt had 16 seconds of ventricular bigeminy before returning back into NSR. Upon arrival to room RT was in room. Pt in chair resting comfortably and asymptomatic. VSS. MD, Rawan Rajab notified. No new orders. RN informed to reach out to new on call doctor if any new concerns arise.     Pt continued to go into ventricular bigeminy around every 5-10 minutes. Remains asymptomatic. Covering provider, Sammy Tayiem notified. No new orders at this time.

## 2023-06-09 NOTE — Progress Notes
 END OF SHIFT/PLAN OF CARE NURSING NOTE    Admission Date: 06/06/2023  Length of Stay: LOS: 3 days    Acute events, pain management, and nursing interventions: no pain reported overnight. MD Ammon Kanaris notified of elevated BP and improved by IV labetalol.     Communication with providers: MD Ammon Kanaris     Intake and Output:        Intake/Output Summary (Last 24 hours) at 06/09/2023 0651  Last data filed at 06/09/2023 0501  Gross per 24 hour   Intake 850 ml   Output 150 ml   Net 700 ml        Last Bowel Movement Date:  (PTA)    Fall Risk/JHFRAT Interventions and Education:   Elimination: Urinal/incontinent/toilet  Medications: 2 or more  Bed/chair alarm: on  Patient Care Equipment: gait belt, walker  Mobility: Uw1  Cognition: Aox3-4    Patient Education  Patient-specific education provided related to quality/safety:  Fall risk and Pressure injury care/prevention  Patient-specific education provided (other): medications  Learners: Patient  Method/materials used: Therapist, art  Response to learning: TEFL teacher Understanding  Needs reinforcement on: n/a    Patient Goal(s):  Patient will Verbalize readiness for discharge by discharge date         Patient will  Maintain optimal gas exchange  by discharge date   Other:    Restraints:  No  Restraint Goal: Patient will be free from injury while physically restrained.  See Docflowsheet for restraint documentation, interventions, education, etc. s

## 2023-06-09 NOTE — Progress Notes
 General Progress Note      Admission Date: 06/06/2023  LOS: 3 days                     Assessment/Plan:    Principal Problem:    Hypoxia    Javier Gutierrez is an 83 y/o M with PMH of known asthma/COPD, HFrEF, A-fib (on Eliquis), HTN, PAD, CVA with expressive aphasia, CKD3, who presents with progressive shortness of breath and hypoxia    Left lower lobe pneumonia  RLL mucus plugging 2/2 aspiration  + Rhinovirus/enterovirus  Chronic bronchiectasis  Moderate dysphagia  - recent diagnosis of pseudomonal pneumonia in April 2025  - patient recently admitted at [facility] for pneumonia and treated with zosyn followed by a week long course of Levaquin through 5/12, daughter reports breathing again worsened a few days prior to arrival   - CTA chest on admission with LLL consolidation and development of bibasilar nodular opacities consistent with multifocal pneumonia and cental and peripheral bronchiectasis  - reports history of aspiration  - MRSA nares negative   - RVP + rhinovirus/enterovirus  - evaluated by SLP on 5/16 with concerns for moderate dysphagia  Plan:   - pulmonary following, appreciate recs  - continue IV Zosyn (5/15 - present)  - follow sputum culture (5/17): few gram positive cocci in singles and/or pairs, rare budding yeast  - urine legionella and histoplasma Ag ordered  - Mucinex BID, DuoNebs Q4H and PRN, sodium chloride nebulizer BID  - follow blood cultures (5/15)     History of asthma vs COPD with recent exacerbation  - patient recently treated for COPD exacerbation and has been on steroid taper  - continue PTA budesonide and albuterol PRN  - continue PTA montelukast  - continue PTA steroid taper with prednisone  - has outpatient pulmonary establish care appt arranged with plan for outpatient PFTs in August 2025    Hypertension, uncontrolled  Chronic systolic heart failure  PAD s/p stent  CVA with expressive aphasia  Atrial fibrillation  - TTE 3/7: LVEF 45%. Segmental WMA. Mild LV diastolic dysfunction. Trace MR.  - BNP elevated on admission to 2297, patient appears euvolemic on exam and is at his dry weight of 164lbs  - hs troponin mildly elevated in ED 36->40->43, patient denies any chest pain  - EKG in ED with sinus tach and bifascicular block, noted on prior EKGs  Plan:   - as outpatient can discuss with cardiologist regarding increase in dose of apixaban (to 5mg  BID rather than 2.5mg  BID) as currently no clear indication for reduced dose  - cont PTA statin, Plavix  - cont PTA metoprolol succinate 12.5mg  daily > up-titrated to 25mg  daily beginning 5/18  - per pt's daughter pt has had labile blood pressures, especially one time after taking Flomax he had to be taken off of all anti-hypertensive meds and is now just beginning to titrate up  - will add lisinopril 2.5mg  BID based off of outpatient cardiology notes  - takes furosemide 20mg  daily PRN    CKD3  - Cr at baseline  - avoid nephrotoxic agents     Hypothyroidism: PTA levothyroxine     Diet: Diet Cardiac (Low Fat/Low Sodium)  VTE ppx: Eliquis  CODE: Full Code  DISPO: continue inpatient admission for pneumonia.    High medical decision making due to the following:  1 acute or chronic illness that poses a threat to life or bodily function  Review of notes outside of my specialty, Review of  each unique test, and Ordering of each unique test      Gaynell Keeler, MD  Internal Medicine Hospitalist  [facility]  __________________________________________________________________________________________________________________________________    Subjective    No acute events overnight. Pt continues to have congestion and is not able to cough up all secretions. He otherwise feels well.   We discussed his blood pressure. Per daughter, pt has long standing hypertension. Recently had a kidney stone and was put on Flomax but this resulted in significant and undetectable blood pressure requiring hospitalization. He was then taken off of all anti hypertensives. Just recently resumed metoprolol and has been following with cardiology outpatient with plans to start back lisinopril soon.     Medications  Scheduled Meds:albuterol-ipratropium (DUONEB) nebulizer solution 3 mL, 3 mL, Inhalation, QID & PRN  apixaban (ELIQUIS) tablet 2.5 mg, 2.5 mg, Oral, BID  atorvastatin (LIPITOR) tablet 20 mg, 20 mg, Oral, QDAY  budesonide (PULMICORT) nebulizer solution 0.5 mg, 0.5 mg, Inhalation, BID  clopiDOGreL (PLAVIX) tablet 75 mg, 75 mg, Oral, QDAY  guaiFENesin LA (MUCINEX) tablet 600 mg, 600 mg, Oral, BID  levothyroxine (SYNTHROID) tablet 88 mcg, 88 mcg, Oral, QDAY 30 min before breakfast  metoprolol succinate XL (TOPROL XL) tablet 25 mg, 25 mg, Oral, QDAY  montelukast (SINGULAIR) tablet 10 mg, 10 mg, Oral, QHS  pantoprazole DR (PROTONIX) tablet 40 mg, 40 mg, Oral, QDAY  piperacillin/tazobactam (ZOSYN) 4.5 g in sodium chloride 0.9% (NS) 100 mL IVPB (MB+), 4.5 g, Intravenous, Q6H*  predniSONE (DELTASONE) tablet 10 mg, 10 mg, Oral, QDAY  sodium chloride (NEBUSAL) 3 % nebulizer solution 4 mL, 4 mL, Inhalation, BID    Continuous Infusions:  PRN and Respiratory Meds:albuterol 0.083% QID PRN, labetalol (NORMODYNE; TRANDATE) injection Q6H PRN, melatonin QHS PRN, nystatin BID PRN, ondansetron Q6H PRN **OR** ondansetron (ZOFRAN) IV Q6H PRN, polyethylene glycol 3350 QDAY PRN, sennosides-docusate sodium QDAY PRN      Objective                       Vital Signs: Last Filed                 Vital Signs: 24 Hour Range   BP: 165/82 (05/18 0738)  Temp: 36.5 ?C (97.7 ?F) (05/18 1610)  Pulse: 77 (05/18 0738)  Respirations: 18 PER MINUTE (05/18 0920)  SpO2: 95 % (05/18 0920)  O2 Device: None (Room air) (05/18 0920) BP: (147-205)/(62-101)   Temp:  [36.3 ?C (97.4 ?F)-36.7 ?C (98 ?F)]   Pulse:  [77-89]   Respirations:  [18 PER MINUTE]   SpO2:  [94 %-100 %]   O2 Device: None (Room air)     Vitals:    06/06/23 1442 06/07/23 1445   Weight: 72.6 kg (160 lb) 70 kg (154 lb 5.2 oz) Intake/Output Summary:  (Last 24 hours)    Intake/Output Summary (Last 24 hours) at 06/09/2023 0954  Last data filed at 06/09/2023 0501  Gross per 24 hour   Intake 500 ml   Output 150 ml   Net 350 ml           Physical Exam   General: no acute distress, old, frail.   HEENT: mucus membranes appear moist.   Cardiovascular: regular rate, regular rhythm.   Lungs: clear to auscultation bilaterally, on room air.   Abdomen: soft, non-tender.   Extremities: no deformity, no edema.   Neuro: alert and oriented but sometimes slow to respond. no focal neurologic deficit.   Skin: scattered  bruising and senile purpura on arms.    Psychiatry: calm, cooperative.     Lab Review  CBC w/Diff    Lab Results   Component Value Date/Time    WBC 7.00 06/09/2023 07:59 AM    RBC 4.79 06/09/2023 07:59 AM    HGB 12.1 (L) 06/09/2023 07:59 AM    HCT 38.8 (L) 06/09/2023 07:59 AM    MCV 80.9 06/09/2023 07:59 AM    MCH 25.2 (L) 06/09/2023 07:59 AM    MCHC 31.2 (L) 06/09/2023 07:59 AM    RDW 18.8 (H) 06/09/2023 07:59 AM    PLTCT 98 (L) 06/09/2023 07:59 AM    MPV 8.6 06/09/2023 07:59 AM    Lab Results   Component Value Date/Time    NEUT 92.0 (H) 06/06/2023 04:46 PM    ANC 9.90 (H) 06/06/2023 04:46 PM    LYMA 3.2 (L) 06/06/2023 04:46 PM    ALC 0.30 (L) 06/06/2023 04:46 PM    MONA 4.2 06/06/2023 04:46 PM    AMC 0.40 06/06/2023 04:46 PM    EOSA 0.0 06/06/2023 04:46 PM    AEC 0.00 06/06/2023 04:46 PM    BASA 0.6 06/06/2023 04:46 PM    ABC 0.10 06/06/2023 04:46 PM        Comprehensive Metabolic Profile    Lab Results   Component Value Date/Time    NA 137 06/09/2023 07:59 AM    K 3.8 06/09/2023 07:59 AM    CL 102 06/09/2023 07:59 AM    CO2 26 06/09/2023 07:59 AM    GAP 9 06/09/2023 07:59 AM    BUN 20 06/09/2023 07:59 AM    CR 1.35 (H) 06/09/2023 07:59 AM    GLU 67 (L) 06/09/2023 07:59 AM    Lab Results   Component Value Date/Time    CA 7.8 (L) 06/09/2023 07:59 AM    PO4 2.1 03/12/2023 05:49 AM    ALBUMIN 2.9 (L) 06/09/2023 07:59 AM    TOTPROT 5.5 (L) 06/09/2023 07:59 AM    ALKPHOS 40 06/09/2023 07:59 AM    AST 12 06/09/2023 07:59 AM    ALT 13 06/09/2023 07:59 AM    TOTBILI 0.6 06/09/2023 07:59 AM    GFR 52 (L) 06/09/2023 07:59 AM    GFRAA 48 (L) 06/29/2018 06:53 AM          Point of Care Testing  (Last 24 hours)  Glucose: (!) 67    Radiology and other Diagnostics Review:  Pertinent radiology reviewed     Ines Mane, MD

## 2023-06-10 VITALS — BP 158/71 | HR 92 | Temp 97.70000°F

## 2023-06-10 VITALS — HR 90

## 2023-06-10 VITALS — HR 85

## 2023-06-10 VITALS — BP 178/90 | HR 64 | Temp 97.50000°F

## 2023-06-10 VITALS — BP 159/70

## 2023-06-10 VITALS — BP 130/55 | HR 91 | Temp 97.90000°F

## 2023-06-10 VITALS — HR 91

## 2023-06-10 VITALS — BP 180/72 | HR 84 | Temp 97.90000°F

## 2023-06-10 VITALS — BP 153/79

## 2023-06-10 LAB — CULTURE - RESPIRATORY

## 2023-06-10 MED ORDER — CEFEPIME 2G/100ML NS IVPB (MB+)
2 g | Freq: Two times a day (BID) | INTRAVENOUS | 0 refills | Status: DC
Start: 2023-06-10 — End: 2023-06-13
  Administered 2023-06-10 – 2023-06-12 (×8): 2 g via INTRAVENOUS

## 2023-06-10 MED ORDER — METRONIDAZOLE 500 MG PO TAB
500 mg | Freq: Two times a day (BID) | ORAL | 0 refills | Status: DC
Start: 2023-06-10 — End: 2023-06-13
  Administered 2023-06-11 – 2023-06-13 (×6): 500 mg via ORAL

## 2023-06-10 MED ORDER — POTASSIUM CHLORIDE 20 MEQ/15 ML PO LIQD
40 meq | Freq: Once | ORAL | 0 refills | Status: DC
Start: 2023-06-10 — End: 2023-06-10

## 2023-06-10 MED ORDER — POTASSIUM CHLORIDE 20 MEQ PO TBTQ
40 meq | Freq: Once | ORAL | 0 refills | Status: CP
Start: 2023-06-10 — End: ?
  Administered 2023-06-10: 19:00:00 40 meq via ORAL

## 2023-06-10 NOTE — Progress Notes
 Pulmonary and Critical Care Medicine Consult      Admission Date: 06/06/2023                                                LOS: 4 days    Reason for Pulmonary Consult:  recent admission with PNA, just completed course of Zosyn followed by levofloxacin, no improvement, also COPD on steroid taper - appreciate recs regarding management - longer steroid taper? double pseudomonal coverage if considered HAP?     Principal Problem:    Hypoxia      Impression:  Javier Gutierrez is a 83 y.o. male with HFrEF, A fib (on Eliquis),  hypothyroidism, history of PE, necrotizing hospital acquired pneumonia with empyema 2020, heavy tobacco use (started age 70, quit 2020. Smoked 1 ppd) who was admitted on 06/06/2023 for shortness of breath.     CT chest with increasing size of LLL consolidation, and right lower lobe endobronchial mucus plugging which is new from April. He also has chronic bronchiectasis which is focal likely from past pneumonias. RVP with rhino/enterovirus which he also had on recent RVP 4/19 so likely not truly infected with rhino/entero at this time. Procal 0.06. No sputum culture yet but he did produce a sample during our visit.     He had a recent PSAE pneumonia treated with 5 days of levofloxacin 500 mg. He was likely just not adequately treated with this and needs a longer IV antibiotic course to clear the pseudomonas. Agree with repeat sputum culture to ensure we are adequately covering any additional bugs and new resistance patterns with IV Zosyn. No wheezing on exam to necessitate more steroids. He likely also has ongoing silent aspiration which would explain the changes in his RLL on the CT.     He has a diagnosis of moderate persistent asthma according to a pulm note from 2020 and was on Flovent at the time. No pulm follow-up since then. COPD is listed on his problem list however last PFT in 2017 did not show obstructive defect, and was actually normal. He was previously on Flovent but was switched to nebulized budesonide and formoterol fumarate, this change was made at an OSH for better airway clearance. He is compliant.    #LLL pneumonia  #RLL nodular opacities, mucus plugging likely 2/t aspiration   #Recent pseudomonal pneumonia, likely inadequately treated outpatient   #Former heavy tobacco use  #Chronic bronchiectasis   #Hx of PE   #Hx of necrotizing PNA and empyema 2020  #Worsening thrombocytopenia    -Urine legionella AG neg  -Recent sputum cx 05/11/23: pseudomonas (pan-susceptible)  -5/15 Bcx2: NGx3d  -5/17 sputum cx: few gram positive cocci in singles and/or pairs, rare budding yeast; cx in process  -Afebrile, HR 80-100's, 94-100% on RA, RR 18, BP hypertensive 159/70 this AM  -Net +1.0L last 24hr, net +1.8L since admit  -Other pertinent inpt meds: eliquis 2.5mg  BID, plavix 75mg  qday  -Hgb 12.5, WBC 5.6, plt 87 (139 on admit, prev 200-250's last month), Na 135, bicarb 23, cr 1.45 (1.35), alb 2.7      Recommendation:  -With worsening thrombocytopenia and sputum gram stain findings, would recommend adjusting abx from IV zosyn (started 5/15) to cefepime and flagyl due to concern for zosyn induced thrombocytopenia, while still maintaining adequate pseudomonal coverage and anaerobic coverage  -Will f/u sputum cx, histo Ag  -  No indication for prolonged steroid taper; currently on pred 10mg  through 5/20  -Continue home nebulized budesonide and formoterol fumarate; duonebs q4h and PRN, mucinex PRN, montelukast 10mg  qhs  -He is already scheduled for an appointment with Dr Rowland Copas in August 2025 with PFTs to establish care  -Workup of worsening thrombocytopenia per primary team; could consider periph smear +/- additional infx w/u based on index of suspicion         Patient seen and discussed with Dr. Annabella Barr, MD  Internal Medicine, PGY-3              Available on Voalte or by Pager [phone]      ______________________________________________________________________    Subjective:   Pt sitting in bedside chair with daughter present at bedside who aided in history taking. Pt with NAOE, in NAD. Denies any new symptoms or concerns at this time, HDS, on RA satting appropriately. Discussed plans to recommend abx adjustment 2/2 thrombocytopenia. Otherwise no other acute concerns at this time.         Review of Systems: neg unless otherwise mentioned above in subjective hx      Past Medical History:  Past Medical History:    Acute on chronic systolic heart failure, NYHA class 2 (CMS-HCC)    Amyloidosis (CMS-HCC)    Aortic valve stenosis, mild    Arrhythmia    Arterial occlusion    Arthritis    Asthma    CAD (coronary artery disease)    Cardiomyopathy (CMS-HCC)    Carotid artery plaque    Cataract    CHF (congestive heart failure) (CMS-HCC)    Chronic kidney disease    Colon polyps    COPD (chronic obstructive pulmonary disease) (CMS-HCC)    Coronary atherosclerosis    Dizziness    Dyspnea    Frailty    H/O: CVA (cardiovascular accident)    History of renal artery stenosis    HTN    Hyperlipidemia    Hyperlipidemia    Hypertension    Hypothyroidism    Kidney disease    Kidney stones    Lung disease    Peripheral vascular disease    Tobacco abuse    Valvular heart disease       Past Surgical History:  Surgical History:   Procedure Laterality Date    HX LUMBAR DISKECTOMY  01/22/1966    30 years ago    STENT INTRAVASCULAR  01/23/2004    kidney stent placed    Left Heart Catheterization With Ventriculogram Left 03/11/2015    Performed by Adrien Alberta, MD, [facility] at [facility] CATH LAB    Coronary Angiography N/A 03/11/2015    Performed by Adrien Alberta, MD, [facility] at [facility] CATH LAB    Possible Percutaneous Coronary Intervention N/A 03/11/2015    Performed by Adrien Alberta, MD, [facility] at [facility] CATH LAB    ESOPHAGOGASTRODUODENOSCOPY N/A 04/26/2015    Performed by Leonia Raman, DO at [facility] ENDO    ESOPHAGOGASTRODUODENOSCOPY BIOPSY  04/26/2015    Performed by Leonia Raman, DO at [facility] ENDO    REPLACEMENT TRANSCATHETER AORTIC VALVE (Sapien 26s3), right common femoral artery approach N/A 08/03/2015    Performed by Jerrald Moose, MD at [facility] CVOR    BRONCHOSCOPY Bilateral 11/30/2016    Performed by Daisy Du, MD at [facility] OR    BRONCHOSCOPY WITH BRONCHIAL ALVEOLAR LAVAGE - FLEXIBLE Right 11/30/2016    Performed by Daisy Du, MD at [facility] OR  ESOPHAGOGASTRODUODENOSCOPY for PEG placement N/A 12/06/2016    Performed by Buckles, Tino Foreman, MD at [facility] ENDO    ENDARTERECTOMY CAROTID ARTERY Left 02/17/2018    Performed by Montgomery Apgar, MD at [facility] OR    ANOMALOUS VENOUS RETURN REPAIR  2017    ECHOCARDIOGRAM PROCEDURE  05/2022    HX CATARACT REMOVAL  2018    HX HEART CATHETERIZATION  2015    KIDNEY STONE SURGERY  2025       Social History:  Social History     Socioeconomic History    Marital status: Widowed   Tobacco Use    Smoking status: Former     Current packs/day: 0.00     Average packs/day: 1.5 packs/day for 60.0 years (90.0 ttl pk-yrs)     Types: Cigarettes     Start date: 02/13/1958     Quit date: 02/13/2018     Years since quitting: 5.3    Smokeless tobacco: Never    Tobacco comments:     0.5-2 ppd history   Vaping Use    Vaping status: Never Used   Substance and Sexual Activity    Alcohol use: Not Currently    Drug use: Never    Sexual activity: Not Currently     Partners: Female       Family History:  Family History   Problem Relation Name Age of Onset    Stroke Mother Virginia  Skipper     Other Mother Virginia  Renbarger         colon cancer    Other Father          valve disease/COPD    Aortic Disease/Dissection Father      Cancer Mother Virginia  Febo         Colon    Heart Disease Father Garfield Coiner        Allergies:  Tamsulosin    Medications Prior to Admission   Medication Sig Dispense Refill Last Dose/Taking    albuterol 0.083% (PROVENTIL) 2.5 mg /3 mL (0.083 %) nebulizer solution Inhale 3 mL solution by nebulizer as directed twice daily as needed.   Past Month    apixaban (ELIQUIS) 2.5 mg tablet TAKE 1 TABLET BY MOUTH TWO TIMES A DAY 180 tablet 3 06/06/2023 Morning    atorvastatin (LIPITOR) 20 mg tablet Take one tablet by mouth daily. 90 tablet 3 06/06/2023 Morning    budesonide (PULMICORT) 0.5 mg/2 mL nebulizer solution Inhale 2 mL solution by nebulizer as directed twice daily.   06/06/2023 Morning    clopiDOGrel (PLAVIX) 75 mg tablet Take one tablet by mouth daily. 30 tablet 1 06/06/2023 Morning    dutasteride (AVODART) 0.5 mg PO capsule Take one capsule by mouth daily.   06/06/2023 Morning    food supplemt, lactose-reduced (ENSURE PLUS) 0.05 gram- 1.5 kcal/mL oral liquid Take 1 Can by mouth twice daily with meals.   06/06/2023 Morning    formoterol fumarate (PERFOROMIST) 20 mcg/2 mL nebulizer vial Inhale 2 mL solution by nebulizer as directed twice daily.   06/06/2023 Morning    furosemide (LASIX) 20 mg tablet Take one tablet by mouth daily as needed. 90 tablet 0     levothyroxine (SYNTHROID) 88 mcg tablet Take one tablet by mouth daily 30 minutes before breakfast.   06/06/2023 Morning    metoprolol succinate XL (TOPROL XL) 25 mg extended release tablet Take one-half tablet by mouth daily. 45 tablet 3 06/06/2023 Morning    montelukast (SINGULAIR) 10 mg tablet Take one tablet  by mouth at bedtime daily. 30 tablet 1 06/05/2023    MYRBETRIQ 25 mg tablet Take one tablet by mouth daily.   06/06/2023 Morning    other medication Prevagen Regular Strength - Take 1 capsule by mouth daily   06/06/2023 Morning    pantoprazole DR (PROTONIX) 40 mg tablet Take one tablet by mouth daily. 30 tablet 1 06/06/2023 Morning    predniSONE (DELTASONE) 10 mg tablet 20 mg today, 10 mg for 3 days, then stop.   06/06/2023 Morning       Medications:  Scheduled Meds:albuterol-ipratropium (DUONEB) nebulizer solution 3 mL, 3 mL, Inhalation, QID & PRN  apixaban (ELIQUIS) tablet 2.5 mg, 2.5 mg, Oral, BID  atorvastatin (LIPITOR) tablet 20 mg, 20 mg, Oral, QDAY  budesonide (PULMICORT) nebulizer solution 0.5 mg, 0.5 mg, Inhalation, BID  clopiDOGreL (PLAVIX) tablet 75 mg, 75 mg, Oral, QDAY  guaiFENesin LA (MUCINEX) tablet 600 mg, 600 mg, Oral, BID  levothyroxine (SYNTHROID) tablet 88 mcg, 88 mcg, Oral, QDAY 30 min before breakfast  lisinopriL (ZESTRIL) tablet 2.5 mg, 2.5 mg, Oral, BID  metoprolol succinate XL (TOPROL XL) tablet 25 mg, 25 mg, Oral, QDAY  montelukast (SINGULAIR) tablet 10 mg, 10 mg, Oral, QHS  pantoprazole DR (PROTONIX) tablet 40 mg, 40 mg, Oral, QDAY  piperacillin/tazobactam (ZOSYN) 4.5 g in sodium chloride 0.9% (NS) 100 mL IVPB (MB+), 4.5 g, Intravenous, Q6H*  predniSONE (DELTASONE) tablet 10 mg, 10 mg, Oral, QDAY  sodium chloride (NEBUSAL) 3 % nebulizer solution 4 mL, 4 mL, Inhalation, BID    Continuous Infusions:  PRN and Respiratory Meds:albuterol 0.083% QID PRN, labetalol (NORMODYNE; TRANDATE) injection Q6H PRN, melatonin QHS PRN, nystatin BID PRN, ondansetron Q6H PRN **OR** ondansetron (ZOFRAN) IV Q6H PRN, polyethylene glycol 3350 QDAY PRN, sennosides-docusate sodium QDAY PRN      Vital Signs:  Last Filed in 24 hours Vital Signs:  24 hour Range    BP: 159/70 (05/19 0615)  Temp: 36.6 ?C (97.9 ?F) (05/19 1610)  Pulse: 84 (05/19 0525)  Respirations: 18 PER MINUTE (05/19 0525)  SpO2: 94 % (05/19 0525)  O2 Device: None (Room air) (05/19 0525) BP: (137-180)/(65-82)   Temp:  [36.4 ?C (97.6 ?F)-36.8 ?C (98.2 ?F)]   Pulse:  [84-105]   Respirations:  [18 PER MINUTE]   SpO2:  [94 %-100 %]   O2 Device: None (Room air)     Physical Exam:  Gen: AOx3, NAD.  HEENT: Normocephalic, atraumatic. EOMI, PERRL, no oral lesions, MMM.  RESP: CTAB, equal expansion BL, no accessory muscle use, normal WOB.  CVS: NRRR, no murmurs, rubs or gallops. No JVD.  ABD: Soft, non-tender, non-distended, BS+, no hepatosplenomegaly.   Ext: No rashes, no edema, no joint swelling.  Neuro: No gross deficits, strength 5/5, non-focal.  Psych: Mood stable.  Skin: No rashes on exposed skin. Warm and dry.    Lab:  Recent Labs     06/08/23  0020 06/08/23  1113 06/09/23  0759 06/10/23  0648   NA 138 140 137 135*   K 3.7 4.2 3.8 3.4*   CL 102 102 102 102   CO2 24 29 26 23    BUN 27* 24 20 17    CR 1.31* 1.33* 1.35* 1.45*   GLU 144* 111* 67* 76   GAP 12 9 9 10    GFR 54* 53* 52* 48*   MG 1.8  --   --   --    CA 8.0* 8.4* 7.8* 7.9*     Recent Labs  06/08/23  1113 06/09/23  0759 06/10/23  0648   ALKPHOS 43 40 37   AST 12 12 10    ALT 15 13 12    TOTPROT 5.7* 5.5* 5.2*   TOTBILI 0.7 0.6 0.6   ALBUMIN 3.1* 2.9* 2.7*     Recent Labs     06/08/23  1113 06/09/23  0759 06/10/23  0648   HGB 12.9* 12.1* 12.5*   HCT 38.6* 38.8* 37.4*   WBC 8.60 7.00 5.60   PLTCT 108* 98* 87*     No results for input(s): PHART, PCO2A, PO2ART, HCO3A, O2SATACAL in the last 72 hours.    Radiology and other diagnostic tests:  I have personally reviewed all imaging and diagnostic tests.    Crislyn Willbanks, MD  Pager

## 2023-06-10 NOTE — Progress Notes
 RT Adult Assessment Note    NAME:Javier Gutierrez             MRN: [id number]             DOB:01-18-1941          AGE: 83 y.o.  ADMISSION DATE: 06/06/2023             DAYS ADMITTED: LOS: 4 days    Additional Comments:  Impressions of the patient: Patient is alert and cooperative. No visible signs of respiratory distress noted at this time.   Intervention(s)/outcome(s): RT Eval  Patient education that was completed: N/A  Recommendations to the care team: None at this time.     Vital Signs:  Pulse:    RR: 18 PER MINUTE  SpO2: 96 %  O2 Device: None (Room air)  Respiratory WDL: Within Defined Limits  Respiratory Effort/Pattern: Unlabored  Comments:

## 2023-06-10 NOTE — Progress Notes
 END OF SHIFT/PLAN OF CARE NURSING NOTE    Admission Date: 06/06/2023  Length of Stay: LOS: 4 days    Acute events, pain management, and nursing interventions:    Denied pain, nausea throughout shift   Tele- SR with multiple PVCs and SA with multiple PACs and PVCs   D/C- Lives in [place], [place] with daughter/caretaker [name]     Communication with providers:   Notified Dr. Jolane Nations about patient's 6-beat run of v-tach at 0000, patient sleeping at the time     Intake and Output:        Intake/Output Summary (Last 24 hours) at 06/10/2023 0646  Last data filed at 06/09/2023 2100  Gross per 24 hour   Intake 940 ml   Output --   Net 940 ml        Last Bowel Movement Date: 06/09/23    Fall Risk/JHFRAT Interventions and Education:   Elimination: Urinal  Medications: BP meds, BM meds  Bed/chair alarm: On  Patient Care Equipment: IV pole  Mobility: x1, walker  Cognition: Alert, x2-3 orientation    Patient Education  Patient-specific education provided related to quality/safety:  Fall risk  Patient-specific education provided (other): POC  Learners: Patient and Family  Method/materials used: Verbal teaching  Response to learning: [employer] Understanding    Patient Goal(s):  Patient will Report breathing has improved by discharge date         Patient will  Verbalize readiness for discharge by discharge date    Restraints:  No

## 2023-06-10 NOTE — Progress Notes
 SPEECH-LANGUAGE PATHOLOGY  DAILY TREATMENT NOTE      Patient seen 1x this date.  Documentation reflects all daily treatment sessions.    SUMMARY OF THERAPY SESSION:   Treatment Summary:   Dysphagia therapy completed.     Moderate oropharyngeal dysphagia  Sources of Dysphagia: Baseline dysphagia, COPD  Persons Educated: Patient/Family  Prognosis: Fair    Swallow Recommendations  Option 1:   PO: Minced and moist, Thin liquids (with known risk of aspiration)  Medications: 1 at a time, As tolerated  Supervision: 1:1  Positioning: Upright 90 degrees or chair mode  Swallow Strategies: 100% supervision to provide cues  for swallow strategies during meals, Small bites/sips, Slow rate of intake, Limit distractions during PO  Oral Hygiene:  Complete oral care BID     Option 2:  PO: Minced and moist, Moderately Thick Liquids (with potential risk of aspiration over course of meal)  Medications: 1 at a time, As tolerated  Supervision: 1:1  Positioning: Upright 90 degrees or chair mode  Swallow Strategies: 100% supervision to provide cues  for swallow strategies during meals, Small bites/sips, Slow rate of intake, Limit distractions during PO  Oral Hygiene:  Complete oral care BID  Recommend ongoing SLP at time of d/c     Goal : Pt/pt's family with participate in ongoing education re: dysphagia and dysphagia management.  Met  Comment: Per pt's daughter pt tolerated the minced and moist solids and moderately thick liquids well over the weekend. Discussed the [employer] Protocol more with pt's family. Provided more education re: minced and moist consistencies at home. Would recommend pt continue following up with [facility] SLP at time of d/c. Pt's family with no further questions at this time.   Continue to address this goal    Plan: See if questions arise   Frequency: 0-1x/week    Therapist: Darcus Eastern, MS, CCC-SLP   Date: 06/10/2023

## 2023-06-11 VITALS — BP 141/64 | HR 74 | Temp 98.10000°F

## 2023-06-11 VITALS — BP 160/77 | HR 85 | Temp 97.50000°F

## 2023-06-11 VITALS — HR 83

## 2023-06-11 VITALS — BP 111/62 | HR 97 | Temp 97.90000°F

## 2023-06-11 VITALS — HR 85

## 2023-06-11 VITALS — BP 153/63 | HR 83 | Temp 97.90000°F

## 2023-06-11 VITALS — HR 82

## 2023-06-11 VITALS — HR 88

## 2023-06-11 VITALS — BP 148/89 | Temp 97.70000°F

## 2023-06-11 VITALS — HR 87

## 2023-06-11 VITALS — BP 134/74 | HR 81 | Temp 97.90000°F

## 2023-06-11 VITALS — BP 138/70 | HR 96 | Temp 97.90000°F

## 2023-06-11 LAB — CBC AND DIFF
~~LOC~~ BKR MCH: 26 pg — ABNORMAL HIGH (ref 26.0–34.0)
~~LOC~~ BKR MCHC: 33 g/dL — ABNORMAL LOW (ref 32.0–36.0)
~~LOC~~ BKR MCV: 80 fL — ABNORMAL HIGH (ref 80.0–100.0)

## 2023-06-11 NOTE — Progress Notes
 PHYSICAL THERAPY  NOTE      Name: Javier Gutierrez   MRN: [id number]     DOB: 27-Dec-1940      Age: 83 y.o.  Admission Date: 06/06/2023     LOS: 5 days     Date of Service: 06/11/2023      Based on discussion with OT, patient's current level of function suggests that patient will progress with one discipline. PT will sign off at this time. Please re-consult if a change in functional status occurs.      Therapist: Shireen Dory, PT, DPT  Date: 06/11/2023

## 2023-06-11 NOTE — Progress Notes
 Pulmonary and Critical Care Medicine Consult      Admission Date: 06/06/2023                                                LOS: 5 days    Reason for Pulmonary Consult:  recent admission with PNA, just completed course of Zosyn followed by levofloxacin, no improvement, also COPD on steroid taper - appreciate recs regarding management - longer steroid taper? double pseudomonal coverage if considered HAP?     Principal Problem:    Hypoxia      Impression:  Javier Gutierrez is a 83 y.o. male with HFrEF, A fib (on Eliquis),  hypothyroidism, history of PE, necrotizing hospital acquired pneumonia with empyema 2020, heavy tobacco use (started age 89, quit 2020. Smoked 1 ppd) who was admitted on 06/06/2023 for shortness of breath.     CT chest with increasing size of LLL consolidation, and right lower lobe endobronchial mucus plugging which is new from April. He also has chronic bronchiectasis which is focal likely from past pneumonias. RVP with rhino/enterovirus which he also had on recent RVP 4/19 so likely not truly infected with rhino/entero at this time. Procal 0.06. No sputum culture yet but he did produce a sample during our visit.     He had a recent PSAE pneumonia treated with 5 days of levofloxacin 500 mg. He was likely just not adequately treated with this and needs a longer IV antibiotic course to clear the pseudomonas. Agree with repeat sputum culture to ensure we are adequately covering any additional bugs and new resistance patterns with IV Zosyn. No wheezing on exam to necessitate more steroids. He likely also has ongoing silent aspiration which would explain the changes in his RLL on the CT.     He has a diagnosis of moderate persistent asthma according to a pulm note from 2020 and was on Flovent at the time. No pulm follow-up since then. COPD is listed on his problem list however last PFT in 2017 did not show obstructive defect, and was actually normal. He was previously on Flovent but was switched to nebulized budesonide and formoterol fumarate, this change was made at an OSH for better airway clearance. He is compliant.    #LLL pneumonia  #RLL nodular opacities, mucus plugging likely 2/t aspiration   #Recent pseudomonal pneumonia, likely inadequately treated outpatient   #Former heavy tobacco use  #Chronic bronchiectasis   #Hx of PE   #Hx of necrotizing PNA and empyema 2020  #Worsening thrombocytopenia    -Urine legionella AG neg  -Recent sputum cx 05/11/23: pseudomonas (pan-susceptible)  -5/15 Bcx2: NGx4d  -5/17 sputum cx: moderate normal oropharyngeal flora, few GPCs, rare budding yeast  -Zosyn (5/15-5/19)  -Completed steroid taper yest 5/19  -SLP following, plan for minced and moist consistencies at time of discharge  -Other pertinent inpt meds: eliquis 2.5mg  BID, plavix 75mg  qday  -Cefepime (5/19 - present)  -Flagyl (5/19 - present)      -AM vitals: Afebrile, HR 80-90's, 95-100% on RA, RR 18, BP 134/74  -Net +982mL last 24hr, net +2.8L since admit (inaccurate I/O's)  -AM labs: Hgb 12.1, WBC 5.2, plt 84 (86 yest, 139 on admit, prev 200-250's last month), INR 1.4, cr 1.29 (1.45), albumin 2.8      Recommendation:  -Cont cefepime/flagyl for now, hopefully plt count nadiring and will improve  w/ time out from last zosyn dose (had gotten zosyn prior to admit at OSH so timeline may fit); hopefully can transition to PO abx in coming days prior to time of discharge (sputum cx w/ normal flora)  -Will f/u histo Ag  -Continue home nebulized budesonide and formoterol fumarate; duonebs q4h and PRN, mucinex PRN, montelukast 10mg  qhs  -He is already scheduled for an appointment with Dr Rowland Copas 8/725 with PFTs to establish care   -We will request sooner outpt f/u with APRN prior to 08/29/23 appointment w/ Dr. Deeann Fare        Patient seen and discussed with Dr. Annabella Barr, MD  Internal Medicine, PGY-3              Available on Voalte or by Pager [phone]      ______________________________________________________________________    Subjective:   Pt sitting in bedside chair with daughter present at bedside. She reports that she feels today she's noticed some gradual improvement in overall clinical state which is good news. Pt with NAOE, in NAD. Denies any new symptoms or concerns at this time, HDS, on RA. Thrombocytopenia hopefully flattening out and will begin to improve soon. Otherwise no other acute concerns at this time.         Review of Systems: neg unless otherwise mentioned above in subjective hx      Past Medical History:  Past Medical History:    Acute on chronic systolic heart failure, NYHA class 2 (CMS-HCC)    Amyloidosis (CMS-HCC)    Aortic valve stenosis, mild    Arrhythmia    Arterial occlusion    Arthritis    Asthma    CAD (coronary artery disease)    Cardiomyopathy (CMS-HCC)    Carotid artery plaque    Cataract    CHF (congestive heart failure) (CMS-HCC)    Chronic kidney disease    Colon polyps    COPD (chronic obstructive pulmonary disease) (CMS-HCC)    Coronary atherosclerosis    Dizziness    Dyspnea    Frailty    H/O: CVA (cardiovascular accident)    History of renal artery stenosis    HTN    Hyperlipidemia    Hyperlipidemia    Hypertension    Hypothyroidism    Kidney disease    Kidney stones    Lung disease    Peripheral vascular disease    Tobacco abuse    Valvular heart disease       Past Surgical History:  Surgical History:   Procedure Laterality Date    HX LUMBAR DISKECTOMY  01/22/1966    30 years ago    STENT INTRAVASCULAR  01/23/2004    kidney stent placed    Left Heart Catheterization With Ventriculogram Left 03/11/2015    Performed by Adrien Alberta, MD, [facility] at [facility] CATH LAB    Coronary Angiography N/A 03/11/2015    Performed by Adrien Alberta, MD, [facility] at [facility] CATH LAB    Possible Percutaneous Coronary Intervention N/A 03/11/2015    Performed by Adrien Alberta, MD, [facility] at [facility] CATH LAB    ESOPHAGOGASTRODUODENOSCOPY N/A 04/26/2015 Performed by Leonia Raman, DO at [facility] ENDO    ESOPHAGOGASTRODUODENOSCOPY BIOPSY  04/26/2015    Performed by Leonia Raman, DO at [facility] ENDO    REPLACEMENT TRANSCATHETER AORTIC VALVE (Sapien 26s3), right common femoral artery approach N/A 08/03/2015    Performed by Jerrald Moose, MD at [facility] CVOR    BRONCHOSCOPY Bilateral 11/30/2016  Performed by Daisy Du, MD at [facility] OR    BRONCHOSCOPY WITH BRONCHIAL ALVEOLAR LAVAGE - FLEXIBLE Right 11/30/2016    Performed by Daisy Du, MD at [facility] OR    ESOPHAGOGASTRODUODENOSCOPY for PEG placement N/A 12/06/2016    Performed by Buckles, Tino Foreman, MD at [facility] ENDO    ENDARTERECTOMY CAROTID ARTERY Left 02/17/2018    Performed by Montgomery Apgar, MD at [facility] OR    ANOMALOUS VENOUS RETURN REPAIR  2017    ECHOCARDIOGRAM PROCEDURE  05/2022    HX CATARACT REMOVAL  2018    HX HEART CATHETERIZATION  2015    KIDNEY STONE SURGERY  2025       Social History:  Social History     Socioeconomic History    Marital status: Widowed   Tobacco Use    Smoking status: Former     Current packs/day: 0.00     Average packs/day: 1.5 packs/day for 60.0 years (90.0 ttl pk-yrs)     Types: Cigarettes     Start date: 02/13/1958     Quit date: 02/13/2018     Years since quitting: 5.3    Smokeless tobacco: Never    Tobacco comments:     0.5-2 ppd history   Vaping Use    Vaping status: Never Used   Substance and Sexual Activity    Alcohol use: Not Currently    Drug use: Never    Sexual activity: Not Currently     Partners: Female       Family History:  Family History   Problem Relation Name Age of Onset    Stroke Mother Virginia  Shieh     Other Mother Virginia  Mcgranahan         colon cancer    Other Father          valve disease/COPD    Aortic Disease/Dissection Father      Cancer Mother Virginia  Slifer         Colon    Heart Disease Father Andrell Bergeson        Allergies:  Tamsulosin    Medications Prior to Admission   Medication Sig Dispense Refill Last Dose/Taking    albuterol 0.083% (PROVENTIL) 2.5 mg /3 mL (0.083 %) nebulizer solution Inhale 3 mL solution by nebulizer as directed twice daily as needed.   Past Month    apixaban (ELIQUIS) 2.5 mg tablet TAKE 1 TABLET BY MOUTH TWO TIMES A DAY 180 tablet 3 06/06/2023 Morning    atorvastatin (LIPITOR) 20 mg tablet Take one tablet by mouth daily. 90 tablet 3 06/06/2023 Morning    budesonide (PULMICORT) 0.5 mg/2 mL nebulizer solution Inhale 2 mL solution by nebulizer as directed twice daily.   06/06/2023 Morning    clopiDOGrel (PLAVIX) 75 mg tablet Take one tablet by mouth daily. 30 tablet 1 06/06/2023 Morning    dutasteride (AVODART) 0.5 mg PO capsule Take one capsule by mouth daily.   06/06/2023 Morning    food supplemt, lactose-reduced (ENSURE PLUS) 0.05 gram- 1.5 kcal/mL oral liquid Take 1 Can by mouth twice daily with meals.   06/06/2023 Morning    formoterol fumarate (PERFOROMIST) 20 mcg/2 mL nebulizer vial Inhale 2 mL solution by nebulizer as directed twice daily.   06/06/2023 Morning    furosemide (LASIX) 20 mg tablet Take one tablet by mouth daily as needed. 90 tablet 0     levothyroxine (SYNTHROID) 88 mcg tablet Take one tablet by mouth daily 30 minutes before breakfast.   06/06/2023 Morning  metoprolol succinate XL (TOPROL XL) 25 mg extended release tablet Take one-half tablet by mouth daily. 45 tablet 3 06/06/2023 Morning    montelukast (SINGULAIR) 10 mg tablet Take one tablet by mouth at bedtime daily. 30 tablet 1 06/05/2023    MYRBETRIQ 25 mg tablet Take one tablet by mouth daily.   06/06/2023 Morning    other medication Prevagen Regular Strength - Take 1 capsule by mouth daily   06/06/2023 Morning    pantoprazole DR (PROTONIX) 40 mg tablet Take one tablet by mouth daily. 30 tablet 1 06/06/2023 Morning    predniSONE (DELTASONE) 10 mg tablet 20 mg today, 10 mg for 3 days, then stop.   06/06/2023 Morning       Medications:  Scheduled Meds:albuterol-ipratropium (DUONEB) nebulizer solution 3 mL, 3 mL, Inhalation, QID & PRN  apixaban (ELIQUIS) tablet 2.5 mg, 2.5 mg, Oral, BID  atorvastatin (LIPITOR) tablet 20 mg, 20 mg, Oral, QDAY  budesonide (PULMICORT) nebulizer solution 0.5 mg, 0.5 mg, Inhalation, BID  cefepime (MAXIPIME) 2 g in sodium chloride 0.9% (NS) 100 mL IVPB (MB+), 2 g, Intravenous, Q12H*  clopiDOGreL (PLAVIX) tablet 75 mg, 75 mg, Oral, QDAY  guaiFENesin LA (MUCINEX) tablet 600 mg, 600 mg, Oral, BID  levothyroxine (SYNTHROID) tablet 88 mcg, 88 mcg, Oral, QDAY 30 min before breakfast  lisinopriL (ZESTRIL) tablet 2.5 mg, 2.5 mg, Oral, BID  metoprolol succinate XL (TOPROL XL) tablet 25 mg, 25 mg, Oral, QDAY  metroNIDAZOLE (FLAGYL) tablet 500 mg, 500 mg, Oral, BID  montelukast (SINGULAIR) tablet 10 mg, 10 mg, Oral, QHS  pantoprazole DR (PROTONIX) tablet 40 mg, 40 mg, Oral, QDAY  sodium chloride (NEBUSAL) 3 % nebulizer solution 4 mL, 4 mL, Inhalation, BID    Continuous Infusions:  PRN and Respiratory Meds:albuterol 0.083% QID PRN, labetalol (NORMODYNE; TRANDATE) injection Q6H PRN, melatonin QHS PRN, nystatin BID PRN, ondansetron Q6H PRN **OR** ondansetron (ZOFRAN) IV Q6H PRN, polyethylene glycol 3350 QDAY PRN, sennosides-docusate sodium QDAY PRN      Vital Signs:  Last Filed in 24 hours Vital Signs:  24 hour Range    BP: 134/74 (05/20 0421)  Temp: 36.6 ?C (97.9 ?F) (05/20 0421)  Pulse: 81 (05/20 0421)  Respirations: 18 PER MINUTE (05/20 0421)  SpO2: 96 % (05/20 0421)  O2 Device: None (Room air) (05/20 0421) BP: (130-178)/(55-90)   Temp:  [36.4 ?C (97.5 ?F)-36.6 ?C (97.9 ?F)]   Pulse:  [64-96]   Respirations:  [17 PER MINUTE-18 PER MINUTE]   SpO2:  [95 %-100 %]   O2 Device: None (Room air)     Physical Exam:  Gen: AOx3, NAD.  HEENT: Normocephalic, atraumatic. EOMI, PERRL, no oral lesions, MMM.  RESP: CTAB, equal expansion BL, no accessory muscle use, normal WOB.  CVS: NRRR, no murmurs, rubs or gallops. No JVD.  ABD: Soft, non-tender, non-distended, BS+, no hepatosplenomegaly.   Ext: No rashes, no edema, no joint swelling.  Neuro: No gross deficits, strength 5/5, non-focal.  Psych: Mood stable.  Skin: No rashes on exposed skin. Warm and dry.    Lab:  Recent Labs     06/08/23  1113 06/09/23  0759 06/10/23  0648   NA 140 137 135*   K 4.2 3.8 3.4*   CL 102 102 102   CO2 29 26 23    BUN 24 20 17    CR 1.33* 1.35* 1.45*   GLU 111* 67* 76   GAP 9 9 10    GFR 53* 52* 48*   CA 8.4* 7.8* 7.9*  Recent Labs     06/08/23  1113 06/09/23  0759 06/10/23  0648   ALKPHOS 43 40 37   AST 12 12 10    ALT 15 13 12    TOTPROT 5.7* 5.5* 5.2*   TOTBILI 0.7 0.6 0.6   ALBUMIN 3.1* 2.9* 2.7*     Recent Labs     06/08/23  1113 06/09/23  0759 06/10/23  0648 06/11/23  0631   HGB 12.9* 12.1* 12.5* 12.1*   HCT 38.6* 38.8* 37.4* 36.4*   WBC 8.60 7.00 5.60 5.20   PLTCT 108* 98* 87* 84*   INR  --   --   --  1.4*     No results for input(s): PHART, PCO2A, PO2ART, HCO3A, O2SATACAL in the last 72 hours.    Radiology and other diagnostic tests:  I have personally reviewed all imaging and diagnostic tests.    Adeeb Konecny, MD

## 2023-06-11 NOTE — Unmapped
 Patient/family declined:  Bed/chair alarm and W/in arms reach during toileting/showering.    Patient/family educated on importance of intervention to their safety/quality of care. Patient/family continues to decline care.    Reason Why Patient/Family declined:  Daughter takes care of pt at home and feels comfortable assisting him with mobility and toileting. OT assessed pt and is okay with this.     Individualized safety and/or care plan implemented. If additional safety measures implemented, please list them.     Escalated to:  Unit coordinator/charge nurse.

## 2023-06-11 NOTE — Progress Notes
 OCCUPATIONAL THERAPY  ASSESSMENT NOTE      Name: Javier Gutierrez   MRN: [id number]     DOB: 05/15/1940      Age: 83 y.o.  Admission Date: 06/06/2023     LOS: 5 days     Date of Service: 06/11/2023      Mobility  Patient Turn/Position: Chair  Mobility Level [facility] Highest Level of Mobility (JH-HLM): Walk 10 steps or more (to the bathroom)  Distance Walked (feet): 45 ft  Level of Assistance: Assist X1  Assistive Device: Walker  Activity Limited By: Weakness    Subjective  Reason for Admission and Past Medical Hx: Javier Gutierrez is an 83 y/o M with PMH of known asthma/COPD, HFrEF, A-fib (on Eliquis), HTN, PAD, CVA with expressive aphasia, CKD3, who presents with progressive shortness of breath and hypoxia. Had pna in April of 2025  Comments: Contact: Rhinovirus  Mental / Cognitive: Alert;Cooperative;Follows commands  Pain: No complaint of pain;Demonstrates no signs of pain  Persons Present: Daughter  Comments: Ok to see per Charity fundraiser. Patient in bed and agreeable to therapy. Session ended with patient in the chair with alarm on and all needs in reach    Home Living Situation  Lives With: Family;Has 24/7 assistance available;Receives assistance from family  Type of Home: House  Entry Stairs: No stairs  In-Home Stairs: No stairs  Bathroom Setup: Walk in shower  Patient Owned Equipment: Bathing: Paediatric nurse;Toilet: Grab bars;Walker with wheels;Wheelchair    Prior Level of Function  Level Of Independence: Assist needed for IADL;Assist needed for mobility related ADL  History of Falls in Past 3 Months: No  Comments: Patient lives with his daughter and son in law. Daughter works from home and reports that somebody is always with him when he's up and walking. 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    ADL's  Where Assessed: Chair  Grooming Assist: Supervision Only For Lines  Grooming Deficits: Setup;Wash/Dry Face  LE Dressing Assist: Minimal Assist  LE Dressing Deficits: Don/Doff R Sock;Don/Doff L Sock    ADL Mobility  Bed Mobility: Supine to Sit: Standby assist  Bed Mobility Comments: increased time, HOB elevated  Transfer Type: Sit to stand  Transfer: Assistance Level: From;Bed;Bedside chair;Minimal assist (CGA)  Transfer: Assistive Device: Roller walker  Transfer: Type of Assistance: For balance;Requires extra time  End of Activity Status: Instructed patient to request assist with mobility;Instructed patient to use call light;Nursing notified;Up in chair  Transfer Comments: Educated patient's daughter on how to don the gait belt and daughter demonstrates awareness of safety and ok to mobilize with patient with RW. RN also notified.  Sitting Balance: Static sitting balance;Dynamic sitting balance;Standby assist  Standing Balance: Static standing balance;2 UE support;Standby assist  Gait Distance: 45 feet  Gait: Assistance Level: Minimal assist  Gait: Assistive Device: Roller walker    Cognition  Comprehension: Hard of Hearing  Expression: Increased Time for Expression  Social Interaction: Interacts in a Spontaneous,Cooperative Manner  Problem Solving: Decreased Judgment/Safety  Attention: Awake/Alert  Cognition Comment: followed commands, intermittently would close his eyes. Per daughter has a goofy personality    ROM  ROM Comments: ROM WFL for age    Strength / Tone  Strength Comments: generalized weakness from hospital stays    Education  Persons Educated: Patient/Family  Topics: Role of OT, Goals for Therapy;Home safety;Energy Conservation    Assessment  Assessment: Decreased ADL Status;Decreased UE Strength;Decreased Endurance    AM-PAC 6 Clicks  Daily Activity Inpatient  Putting on and taking off regular lower body clothes: A Little  Bathing (Including washing, rinsing, drying): A Little  Toileting, which includes using toilet, bedpan, or urinal: A Little  Putting on and taking off regular upper body clothing: A Little  Taking care of personal grooming such as brushing teeth: A Little  Eating meals: None  Daily Activity Raw Score: 19  Standardized (T-scale) Score: 40.22    Plan  OT Frequency: 5x/week  OT Plan for Next Visit: functional mobility, strength, endurance, standing ADLs    ADL Goals  Patient Will Perform All ADL's: w/ Stand By Assist    Functional Transfer Goals  Pt Will Perform All Functional Transfers: w/ Stand By Assist    OT Discharge Recommendations  Recommendation: Home with consistent supervision/assistance  Recommendation for Therapy Post Discharge: Home health  Patient Currently Requires Equipment: Owns what is needed  Comments: Patient has great support and spoke with daughter and she feels comfortable with patient coming home and continuting their routine with home health.    Therapist: Eugune Hews, OTR/L [id number]  Date: 06/11/2023

## 2023-06-11 NOTE — Progress Notes
 General Progress Note      Admission Date: 06/06/2023  LOS: 5 days                     Assessment/Plan:    Principal Problem:    Hypoxia    Javier Gutierrez is an 83 y/o M with PMH of known asthma/COPD, HFrEF, A-fib (on Eliquis), HTN, PAD, CVA with expressive aphasia, CKD3, who presents with progressive shortness of breath and hypoxia    Left lower lobe pneumonia  RLL mucus plugging 2/2 aspiration  + Rhinovirus/enterovirus  Chronic bronchiectasis  Moderate dysphagia  - recent diagnosis of pseudomonal pneumonia in April 2025  - patient recently admitted at [facility] for pneumonia and treated with zosyn followed by a week long course of Levaquin through 5/12, daughter reports breathing again worsened a few days prior to arrival   - CTA chest on admission with LLL consolidation and development of bibasilar nodular opacities consistent with multifocal pneumonia and cental and peripheral bronchiectasis  - reports history of aspiration  - MRSA nares negative, legionella Ag negative  - RVP + rhinovirus/enterovirus  - evaluated by SLP on 5/16 with concerns for moderate dysphagia  - sputum culture (5/17): GS with few gram positive cocci in singles and/or pairs, rare budding yeast  - Zosyn (5/15 - 5/19)  Plan:   - pulmonary following, appreciate recs  - continue cefepime + metronidazole (5/19 - present)  - follow histoplasma Ag  - Mucinex BID, DuoNebs Q4H and PRN, sodium chloride nebulizer BID  - follow blood cultures (5/15)    Acute moderate thrombocytopenia  - plt dropping this admission, down to 84 (5/20)  - felt to be due to Zosyn, discontinued as above, however has been low since day of admission even prior to initiation of Zosyn (5/15)  - discussed with daughter who reported pt did receive about 4 days of Zosyn at outside hospital  - pt on Eliquis and has not been using heparin products  - INR just slightly elevated at 1.6  Plan:   - peripheral smear pending  - Zosyn discontinued  - work up of infection as above  - monitor CBC     History of asthma vs COPD with recent exacerbation  - patient recently treated for COPD exacerbation and had been on steroid taper  - continue PTA budesonide and albuterol PRN  - continue PTA montelukast  - completed steroid taper on 5/19  - has outpatient pulmonary establish care appt arranged with plan for outpatient PFTs in August 2025    Hypertension, uncontrolled  Chronic systolic heart failure  PAD s/p stent  CVA with expressive aphasia  Atrial fibrillation  - TTE 3/7: LVEF 45%. Segmental WMA. Mild LV diastolic dysfunction. Trace MR.  - BNP elevated on admission to 2297, patient appears euvolemic on exam and is at his dry weight of 164lbs  - hs troponin mildly elevated in ED 36->40->43, patient denies any chest pain  - EKG in ED with sinus tach and bifascicular block, noted on prior EKGs  Plan:   - as outpatient can discuss with cardiologist regarding increase in dose of apixaban (to 5mg  BID rather than 2.5mg  BID) as currently no clear indication for reduced dose  - cont PTA statin, Plavix  - cont PTA metoprolol succinate 12.5mg  daily > up-titrated to 25mg  daily beginning 5/18  - per pt's daughter pt has had labile blood pressures, especially one time after taking Flomax he had to be taken off of all anti-hypertensive meds  and is now just beginning to titrate up  - continue lisinopril 2.5mg  BID based off of outpatient cardiology notes  - takes furosemide 20mg  daily PRN    CKD3  - Cr at baseline  - avoid nephrotoxic agents    Hypokalemia  - monitor lab and replace     Hypothyroidism: PTA levothyroxine     Diet: Diet Cardiac (Low Fat/Low Sodium)  VTE ppx: Eliquis  CODE: Full Code  DISPO: continue inpatient admission for pneumonia, thrombocytopenia. Anticipate discharge on PO antibiotics in the next 1-2 days.     High medical decision making due to the following:  1 acute or chronic illness that poses a threat to life or bodily function  Review of notes outside of my specialty, Review of each unique test, and Ordering of each unique test    Gaynell Keeler, MD  Internal Medicine Hospitalist  [facility]  __________________________________________________________________________________________________________________________________    Subjective    Platelets down to 84 today. Per daughter pt was on Zosyn for about 4 days at outside hospital.   Pt overall feeling better. Per daughter he has been more active with more energy.   He plans to take a shower and shave today.   He worked well with PT/OT.     Medications  Scheduled Meds:albuterol-ipratropium (DUONEB) nebulizer solution 3 mL, 3 mL, Inhalation, QID & PRN  apixaban (ELIQUIS) tablet 2.5 mg, 2.5 mg, Oral, BID  atorvastatin (LIPITOR) tablet 20 mg, 20 mg, Oral, QDAY  budesonide (PULMICORT) nebulizer solution 0.5 mg, 0.5 mg, Inhalation, BID  cefepime (MAXIPIME) 2 g in sodium chloride 0.9% (NS) 100 mL IVPB (MB+), 2 g, Intravenous, Q12H*  clopiDOGreL (PLAVIX) tablet 75 mg, 75 mg, Oral, QDAY  guaiFENesin LA (MUCINEX) tablet 600 mg, 600 mg, Oral, BID  levothyroxine (SYNTHROID) tablet 88 mcg, 88 mcg, Oral, QDAY 30 min before breakfast  lisinopriL (ZESTRIL) tablet 2.5 mg, 2.5 mg, Oral, BID  metoprolol succinate XL (TOPROL XL) tablet 25 mg, 25 mg, Oral, QDAY  metroNIDAZOLE (FLAGYL) tablet 500 mg, 500 mg, Oral, BID  montelukast (SINGULAIR) tablet 10 mg, 10 mg, Oral, QHS  pantoprazole DR (PROTONIX) tablet 40 mg, 40 mg, Oral, QDAY  sodium chloride (NEBUSAL) 3 % nebulizer solution 4 mL, 4 mL, Inhalation, BID    Continuous Infusions:  PRN and Respiratory Meds:albuterol 0.083% QID PRN, labetalol (NORMODYNE; TRANDATE) injection Q6H PRN, melatonin QHS PRN, nystatin BID PRN, ondansetron Q6H PRN **OR** ondansetron (ZOFRAN) IV Q6H PRN, polyethylene glycol 3350 QDAY PRN, sennosides-docusate sodium QDAY PRN      Objective                       Vital Signs: Last Filed                 Vital Signs: 24 Hour Range   BP: 141/64 (05/20 0810)  Temp: 36.7 ?C (98.1 ?F) (05/20 4540)  Pulse: 82 (05/20 0931)  Respirations: 18 PER MINUTE (05/20 0810)  SpO2: 97 % (05/20 0924)  O2 Device: None (Room air) (05/20 0924) BP: (130-178)/(55-90)   Temp:  [36.4 ?C (97.5 ?F)-36.7 ?C (98.1 ?F)]   Pulse:  [64-96]   Respirations:  [17 PER MINUTE-18 PER MINUTE]   SpO2:  [95 %-100 %]   O2 Device: None (Room air)     Vitals:    06/06/23 1442 06/07/23 1445   Weight: 72.6 kg (160 lb) 70 kg (154 lb 5.2 oz)       Intake/Output Summary:  (Last  24 hours)    Intake/Output Summary (Last 24 hours) at 06/11/2023 1610  Last data filed at 06/11/2023 0421  Gross per 24 hour   Intake 870 ml   Output --   Net 870 ml           Physical Exam   General: no acute distress, old, frail.  HEENT: normocephalic.   Cardiovascular: equal chest rise.   Lungs: respirations non-labored.   Abdomen: non-distended.   Extremities: no deformity.   Neuro: no focal neurologic deficit.   Skin: scattered bruising and senile purpura on arms.  Psychiatry: calm, cooperative.     Lab Review  CBC w/Diff    Lab Results   Component Value Date/Time    WBC 5.20 06/11/2023 06:31 AM    RBC 4.49 06/11/2023 06:31 AM    HGB 12.1 (L) 06/11/2023 06:31 AM    HCT 36.4 (L) 06/11/2023 06:31 AM    MCV 80.9 06/11/2023 06:31 AM    MCH 26.9 06/11/2023 06:31 AM    MCHC 33.3 06/11/2023 06:31 AM    RDW 18.6 (H) 06/11/2023 06:31 AM    PLTCT 84 (L) 06/11/2023 06:31 AM    MPV 8.7 06/11/2023 06:31 AM    Lab Results   Component Value Date/Time    NEUT 66.1 06/11/2023 06:31 AM    ANC 3.50 06/11/2023 06:31 AM    LYMA 19.1 (L) 06/11/2023 06:31 AM    ALC 1.00 06/11/2023 06:31 AM    MONA 10.6 06/11/2023 06:31 AM    AMC 0.60 06/11/2023 06:31 AM    EOSA 3.6 06/11/2023 06:31 AM    AEC 0.20 06/11/2023 06:31 AM    BASA 0.6 06/11/2023 06:31 AM    ABC 0.00 06/11/2023 06:31 AM        Comprehensive Metabolic Profile    Lab Results   Component Value Date/Time    NA 137 06/11/2023 06:31 AM    K 3.9 06/11/2023 06:31 AM    CL 103 06/11/2023 06:31 AM    CO2 26 06/11/2023 06:31 AM GAP 8 06/11/2023 06:31 AM    BUN 17 06/11/2023 06:31 AM    CR 1.29 (H) 06/11/2023 06:31 AM    GLU 81 06/11/2023 06:31 AM    Lab Results   Component Value Date/Time    CA 8.1 (L) 06/11/2023 06:31 AM    PO4 2.1 03/12/2023 05:49 AM    ALBUMIN 2.8 (L) 06/11/2023 06:31 AM    TOTPROT 5.5 (L) 06/11/2023 06:31 AM    ALKPHOS 36 06/11/2023 06:31 AM    AST 9 06/11/2023 06:31 AM    ALT 12 06/11/2023 06:31 AM    TOTBILI 0.5 06/11/2023 06:31 AM    GFR 55 (L) 06/11/2023 06:31 AM    GFRAA 48 (L) 06/29/2018 06:53 AM          Point of Care Testing  (Last 24 hours)  Glucose: 81    Radiology and other Diagnostics Review:  Pertinent radiology reviewed     Ines Mane, MD

## 2023-06-12 ENCOUNTER — Encounter: Admit: 2023-06-12 | Discharge: 2023-06-12 | Payer: MEDICARE

## 2023-06-12 VITALS — BP 138/64 | HR 81 | Temp 98.20000°F

## 2023-06-12 VITALS — BP 132/72 | HR 86 | Temp 98.50000°F

## 2023-06-12 VITALS — BP 130/63 | HR 79 | Temp 97.90000°F

## 2023-06-12 VITALS — BP 143/69 | HR 81 | Temp 97.90000°F

## 2023-06-12 VITALS — BP 148/96 | HR 87 | Temp 97.50000°F

## 2023-06-12 VITALS — HR 86

## 2023-06-12 VITALS — HR 87

## 2023-06-12 VITALS — BP 122/78 | HR 87 | Temp 98.20000°F

## 2023-06-12 VITALS — HR 91

## 2023-06-12 LAB — ECG 12-LEAD
P AXIS: 52 degrees
P-R INTERVAL: 150 ms
Q-T INTERVAL: 408 ms
QRS DURATION: 116 ms
QTC CALCULATION (BAZETT): 482 ms
R AXIS: -67 degrees
T AXIS: 58 degrees
VENTRICULAR RATE: 84 {beats}/min

## 2023-06-12 LAB — CULTURE-BLOOD W/SENSITIVITY

## 2023-06-12 NOTE — Progress Notes
 General Progress Note      Admission Date: 06/06/2023  LOS: 6 days                     Assessment/Plan:    Principal Problem:    Hypoxia    Javier Gutierrez is an 83 y/o M with PMH of known asthma/COPD, HFrEF, A-fib (on Eliquis), HTN, PAD, CVA with expressive aphasia, CKD3, who presents with progressive shortness of breath and hypoxia    Left lower lobe pneumonia  RLL mucus plugging 2/2 aspiration  + Rhinovirus/enterovirus  Chronic bronchiectasis  Moderate dysphagia  - recent diagnosis of pseudomonal pneumonia in April 2025  - patient recently admitted at [facility] for pneumonia and treated with zosyn followed by a week long course of Levaquin through 5/12, daughter reports breathing again worsened a few days prior to arrival   - CTA chest on admission with LLL consolidation and development of bibasilar nodular opacities consistent with multifocal pneumonia and cental and peripheral bronchiectasis  - reports history of aspiration  - MRSA nares negative, legionella Ag negative  - RVP + rhinovirus/enterovirus  - evaluated by SLP on 5/16 with concerns for moderate dysphagia  - sputum culture (5/17): GS with few gram positive cocci in singles and/or pairs, rare budding yeast  - Zosyn (5/15 - 5/19)  Plan:   - pulmonary has signed off, plan for outpatient TeleHealth f/u with pulm on 5/29  - continue cefepime + metronidazole (5/19 - present) with plans to switch to PO levofloxacin at discharge for total 10-14 day course  > today is day 6/10 of antibiotics  > EKG checked 5/21 QTC  - follow histoplasma Ag  - Mucinex BID, DuoNebs Q4H and PRN, sodium chloride nebulizer BID  - follow blood cultures (5/15)    Acute moderate thrombocytopenia  - plt dropping this admission, down to 79 (5/21)  - felt to be due to Zosyn, discontinued as above, however has been low since day of admission even prior to initiation of Zosyn (5/15)  - discussed with daughter who reported pt did receive about 4 days of Zosyn at outside hospital  - pt on Eliquis and has not been using heparin products  - INR just slightly elevated at 1.6  - peripheral smear (5/20): no qualitative morphologic abnormalities of diagnostic significant   Plan:   - Zosyn discontinued  - monitor CBC     History of asthma vs COPD with recent exacerbation  - patient recently treated for COPD exacerbation and had been on steroid taper  - continue PTA budesonide and albuterol PRN  - continue PTA montelukast  - completed steroid taper on 5/19  - has outpatient pulmonary establish care appt arranged with plan for outpatient PFTs in August 2025    Hypertension, uncontrolled  Chronic systolic heart failure  PAD s/p stent  CVA with expressive aphasia  Atrial fibrillation  - TTE 3/7: LVEF 45%. Segmental WMA. Mild LV diastolic dysfunction. Trace MR.  - BNP elevated on admission to 2297, patient appears euvolemic on exam and is at his dry weight of 164lbs  - hs troponin mildly elevated in ED 36->40->43, patient denies any chest pain  - EKG in ED with sinus tach and bifascicular block, noted on prior EKGs  Plan:   - as outpatient can discuss with cardiologist regarding increase in dose of apixaban (to 5mg  BID rather than 2.5mg  BID) as currently no clear indication for reduced dose  - cont PTA statin, Plavix  - cont PTA  metoprolol succinate 12.5mg  daily > up-titrated to 25mg  daily beginning 5/18  - per pt's daughter pt has had labile blood pressures, especially one time after taking Flomax he had to be taken off of all anti-hypertensive meds and is now just beginning to titrate up  - continue lisinopril 2.5mg  BID based off of outpatient cardiology notes  - takes furosemide 20mg  daily PRN    CKD3  - Cr at baseline  - avoid nephrotoxic agents    Hypokalemia  - monitor lab and replace     Hypothyroidism: PTA levothyroxine     Diet: Diet Cardiac (Low Fat/Low Sodium)  VTE ppx: Eliquis  CODE: Full Code  DISPO: continue inpatient admission for pneumonia, thrombocytopenia. Anticipate discharge on PO antibiotics in the next 1-2 days.     High medical decision making due to the following:  1 acute or chronic illness that poses a threat to life or bodily function  Review of notes outside of my specialty, Review of each unique test, and Ordering of each unique test    Gaynell Keeler, MD  Internal Medicine Hospitalist  [facility]  __________________________________________________________________________________________________________________________________    Subjective    No acute events overnight. Daughter at bedside says patient is more fatigued today but it has been a busy day with many visitors and therefore that is likely contributing to this.  He has a slight tremor but daughter says that it is always there and does not think this is new. Pt has developed a mild rash on his back overnight that is non-itchy and looks better this morning per daughter.     Medications  Scheduled Meds:albuterol-ipratropium (DUONEB) nebulizer solution 3 mL, 3 mL, Inhalation, QID & PRN  apixaban (ELIQUIS) tablet 2.5 mg, 2.5 mg, Oral, BID  atorvastatin (LIPITOR) tablet 20 mg, 20 mg, Oral, QDAY  budesonide (PULMICORT) nebulizer solution 0.5 mg, 0.5 mg, Inhalation, BID  cefepime (MAXIPIME) 2 g in sodium chloride 0.9% (NS) 100 mL IVPB (MB+), 2 g, Intravenous, Q12H*  clopiDOGreL (PLAVIX) tablet 75 mg, 75 mg, Oral, QDAY  guaiFENesin LA (MUCINEX) tablet 600 mg, 600 mg, Oral, BID  levothyroxine (SYNTHROID) tablet 88 mcg, 88 mcg, Oral, QDAY 30 min before breakfast  lisinopriL (ZESTRIL) tablet 2.5 mg, 2.5 mg, Oral, BID  metoprolol succinate XL (TOPROL XL) tablet 25 mg, 25 mg, Oral, QDAY  metroNIDAZOLE (FLAGYL) tablet 500 mg, 500 mg, Oral, BID  montelukast (SINGULAIR) tablet 10 mg, 10 mg, Oral, QHS  pantoprazole DR (PROTONIX) tablet 40 mg, 40 mg, Oral, QDAY  sodium chloride (NEBUSAL) 3 % nebulizer solution 4 mL, 4 mL, Inhalation, BID    Continuous Infusions:  PRN and Respiratory Meds:albuterol 0.083% QID PRN, melatonin QHS PRN, nystatin BID PRN, ondansetron Q6H PRN **OR** ondansetron (ZOFRAN) IV Q6H PRN, polyethylene glycol 3350 QDAY PRN, sennosides-docusate sodium QDAY PRN      Objective                       Vital Signs: Last Filed                 Vital Signs: 24 Hour Range   BP: 130/63 (05/21 0834)  Temp: 36.6 ?C (97.9 ?F) (05/21 1610)  Pulse: 79 (05/21 0834)  Respirations: 18 PER MINUTE (05/21 0834)  SpO2: 95 % (05/21 0834)  O2 Device: None (Room air) (05/21 0834) BP: (111-160)/(62-89)   Temp:  [36.4 ?C (97.5 ?F)-36.6 ?C (97.9 ?F)]   Pulse:  [79-97]   Respirations:  [15 PER  MINUTE-19 PER MINUTE]   SpO2:  [93 %-99 %]   O2 Device: None (Room air)     Vitals:    06/06/23 1442 06/07/23 1445   Weight: 72.6 kg (160 lb) 70 kg (154 lb 5.2 oz)       Intake/Output Summary:  (Last 24 hours)    Intake/Output Summary (Last 24 hours) at 06/12/2023 1610  Last data filed at 06/12/2023 0530  Gross per 24 hour   Intake 287 ml   Output --   Net 287 ml           Physical Exam   General: no acute distress, old, frail.  HEENT: normocephalic.   Cardiovascular: equal chest rise.   Lungs: respirations non-labored. CTA bilaterally.   Abdomen: non-distended.   Extremities: no deformity.   Neuro: alert, no focal neurologic deficit.   Skin: scattered bruising and senile purpura on arms. Mild erythema of upper back.   Psychiatry: calm, cooperative.     Lab Review  CBC w/Diff    Lab Results   Component Value Date/Time    WBC 4.70 06/12/2023 07:04 AM    RBC 4.57 06/12/2023 07:04 AM    HGB 11.9 (L) 06/12/2023 07:04 AM    HCT 36.4 (L) 06/12/2023 07:04 AM    MCV 79.6 (L) 06/12/2023 07:04 AM    MCH 26.0 06/12/2023 07:04 AM    MCHC 32.7 06/12/2023 07:04 AM    RDW 18.5 (H) 06/12/2023 07:04 AM    PLTCT 79 (L) 06/12/2023 07:04 AM    MPV 8.5 06/12/2023 07:04 AM    Lab Results   Component Value Date/Time    NEUT 58.6 06/12/2023 07:04 AM    ANC 2.80 06/12/2023 07:04 AM    LYMA 24.7 06/12/2023 07:04 AM    ALC 1.20 06/12/2023 07:04 AM    MONA 11.5 06/12/2023 07:04 AM    AMC 0.50 06/12/2023 07:04 AM    EOSA 4.2 06/12/2023 07:04 AM    AEC 0.20 06/12/2023 07:04 AM    BASA 1.0 06/12/2023 07:04 AM    ABC 0.00 06/12/2023 07:04 AM        Comprehensive Metabolic Profile    Lab Results   Component Value Date/Time    NA 133 (L) 06/12/2023 07:04 AM    K 3.8 06/12/2023 07:04 AM    CL 101 06/12/2023 07:04 AM    CO2 25 06/12/2023 07:04 AM    GAP 7 06/12/2023 07:04 AM    BUN 17 06/12/2023 07:04 AM    CR 1.14 06/12/2023 07:04 AM    GLU 80 06/12/2023 07:04 AM    Lab Results   Component Value Date/Time    CA 8.2 (L) 06/12/2023 07:04 AM    PO4 2.1 03/12/2023 05:49 AM    ALBUMIN 2.7 (L) 06/12/2023 07:04 AM    TOTPROT 5.3 (L) 06/12/2023 07:04 AM    ALKPHOS 44 06/12/2023 07:04 AM    AST 10 06/12/2023 07:04 AM    ALT 13 06/12/2023 07:04 AM    TOTBILI 0.5 06/12/2023 07:04 AM    GFR >60 06/12/2023 07:04 AM    GFRAA 48 (L) 06/29/2018 06:53 AM          Point of Care Testing  (Last 24 hours)  Glucose: 80    Radiology and other Diagnostics Review:  Pertinent radiology reviewed     Ines Mane, MD

## 2023-06-12 NOTE — Progress Notes
 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.  Chaplain visited to offer prayer, support and sacramental care.   Patient's daughter was present at bedside.  Patient appeared calm and hopeful but concerned about his health and asked for prayer and sacramental care for healing.  Chaplain offered active listening, support, prayer and provided the Sacrament of the Sick / Last Rites for peace, hope and healing.  Patient appreciated for visit.  Continue available for support and prayer.     The spiritual care team is available as needed, 24/7, through the campus switchboard [phone]). For a response within 24 hours, please submit an order in [facility] for a chaplain consult.

## 2023-06-12 NOTE — Progress Notes
 END OF SHIFT/PLAN OF CARE NURSING NOTE    Admission Date: 06/06/2023  Length of Stay: LOS: 6 days    Acute events, pain management, and nursing interventions: no pain reported overnight. No acute events overnight.     Communication with providers: none     Intake and Output:        Intake/Output Summary (Last 24 hours) at 06/12/2023 1610  Last data filed at 06/12/2023 0530  Gross per 24 hour   Intake 287 ml   Output --   Net 287 ml        Last Bowel Movement Date: 06/11/23    Fall Risk/JHFRAT Interventions and Education:   Elimination: toilet/incontinent  Medications: 2 or more  Bed/chair alarm: on  Patient Care Equipment: walker  Mobility: Uw1  Cognition: AOx3    Patient Education  Patient-specific education provided related to quality/safety:  Fall risk, Incontinence management, and Pressure injury care/prevention  Patient-specific education provided (other): medications  Learners: Patient  Method/materials used: Therapist, art  Response to learning: [employer] Understanding  Needs reinforcement on: n/a    Patient Goal(s):  Patient will Verbalize readiness for discharge by discharge date         Patient will  Demonstrate improved self care by discharge date   Other:    Restraints:  No  Restraint Goal: Patient will be free from injury while physically restrained.  See Docflowsheet for restraint documentation, interventions, education, etc.

## 2023-06-12 NOTE — Progress Notes
 OCCUPATIONAL THERAPY  NOTE     Name: Javier Gutierrez   MRN: [id number]     DOB: [birth year]      Age: 83 y.o.  Admission Date: 06/06/2023     LOS: 6 days     Date of Service: 06/12/2023    Patient up in chair sleeping upon arrival. Daughter reports patient performed self care this morning and fatigued from additional activities with visitors. Daughter denies concerns with return home with how patient is mobilzing and participating in ADL tasks. Discussed how to progress patient activity endurance as he continues to recover. Daughter verbalized understanding. Occupational therapy will continue to follow and provide intervention as indicated.    Therapist: Norine Beau, OTR/L  Date: 06/12/2023

## 2023-06-13 ENCOUNTER — Inpatient Hospital Stay: Admit: 2023-06-13 | Discharge: 2023-06-13 | Payer: MEDICARE

## 2023-06-13 ENCOUNTER — Encounter: Admit: 2023-06-13 | Discharge: 2023-06-13 | Payer: MEDICARE

## 2023-06-13 VITALS — BP 156/78 | HR 85 | Temp 97.50000°F

## 2023-06-13 VITALS — BP 155/85 | HR 87 | Temp 97.30000°F

## 2023-06-13 VITALS — BP 156/80 | HR 85 | Temp 97.10000°F

## 2023-06-13 VITALS — BP 112/90 | HR 84 | Temp 97.50000°F

## 2023-06-13 VITALS — HR 81

## 2023-06-13 VITALS — HR 87

## 2023-06-13 VITALS — BP 137/79 | HR 96 | Temp 97.60000°F

## 2023-06-13 VITALS — HR 84

## 2023-06-13 LAB — HISTOPLASMA AG-URINE RANDOM

## 2023-06-13 MED ORDER — LEVETIRACETAM 500 MG/5 ML IV SOLN
2000 mg | Freq: Once | INTRAVENOUS | 0 refills | Status: CP
Start: 2023-06-13 — End: ?
  Administered 2023-06-13: 21:00:00 2000 mg via INTRAVENOUS

## 2023-06-13 MED ORDER — DEXTROSE 50 % IN WATER (D50W) IV SYRG
12.5-25 g | INTRAVENOUS | 0 refills | Status: DC | PRN
Start: 2023-06-13 — End: 2023-06-17

## 2023-06-13 MED ORDER — DEXTROSE 5 %-LACTATED RINGERS IV SOLP
INTRAVENOUS | 0 refills | Status: AC
Start: 2023-06-13 — End: ?
  Administered 2023-06-14: 01:00:00 1000.0000 mL via INTRAVENOUS

## 2023-06-13 MED ORDER — LEVOFLOXACIN IN D5W 750 MG/150 ML IV PGBK
750 mg | INTRAVENOUS | 0 refills | Status: CP
Start: 2023-06-13 — End: ?
  Administered 2023-06-13 – 2023-06-15 (×2): 750 mg via INTRAVENOUS

## 2023-06-13 NOTE — Progress Notes
 END OF SHIFT/PLAN OF CARE NURSING NOTE    Admission Date: 06/06/2023  Length of Stay: LOS: 7 days    Acute events, pain management, and nursing interventions: no pain reported overnight.      Communication with providers: Notified MD Guido Leeks about changes on telemetry.     Intake and Output:        Intake/Output Summary (Last 24 hours) at 06/13/2023 0631  Last data filed at 06/13/2023 0445  Gross per 24 hour   Intake 550 ml   Output --   Net 550 ml        Last Bowel Movement Date: 06/11/23    Fall Risk/JHFRAT Interventions and Education:   Elimination: toilet/incontinent  Medications: 2 or more  Bed/chair alarm: on  Patient Care Equipment: walker  Mobility: Uw1  Cognition: Aox1-3    Patient Education  Patient-specific education provided related to quality/safety:  Fall risk, Incontinence management, and Pressure injury care/prevention  Patient-specific education provided (other): medications  Learners: Patient  Method/materials used: Therapist, art  Response to learning: TEFL teacher Understanding  Needs reinforcement on: n/a    Patient Goal(s):  Patient will Verbalize readiness for discharge by discharge date         Patient will  Display signs of effective airway clearance by absence of dyspnea, respiratory rate within normal limits by discharge date   Other:    Restraints:  No  Restraint Goal: Patient will be free from injury while physically restrained.  See Docflowsheet for restraint documentation, interventions, education, etc.

## 2023-06-13 NOTE — Care Coordination-Inpatient
 Reviewed first 30 minutes of vEEG    No epileptiform activity.    Dellar Fenton MD

## 2023-06-13 NOTE — Progress Notes
 EEG electrodes were successfully placed on pt's scalp and secured without complications. V-EEG was instructed to both the pt and their family. All questions were answered and addressed to their liking. Seizure pads are placed on the bed rail. The event button and emergent call light are placed within the pt and their family's reach. Video, sound and EEG tracings are all functioning properly.

## 2023-06-13 NOTE — Consults
 Neurology Consult Note  Name: Javier Gutierrez   MRN: [id number]     DOB: [birth year]      Age: 83 y.o.  Admission Date: 06/06/2023     LOS: 7 days     Date of Service: 06/13/2023       Reason for Consult:  confusion, jerking movements    Consult type: Co-Management w/Signed Orders    Assessment: Javier Gutierrez is an 83 y/o M with PMH of known asthma/COPD, HFrEF, A-fib (on Eliquis), HTN, PAD, CVA with expressive aphasia that was admitted for hypoxia.  Dx with LLL PNA, was transitioned to cefepime and developed AMS and jerking movments for which neuro was consulted.    Assessment & Plan  Transient alteration of awareness  > since yesterday morning has had decreased verbal output, as well as jerking movements.  He has BUE, R>L jerking movements, myoclonic in nature.  He is able to follow commands intermittently, but no verbal output. Impaired attention.  Concern is for NCSE and/or cefepime neurotoxicity.  CTH was negative so stroke less likely since sx ongoing for close to 24 hours and no other weakness or other sx.    Recs:    > STAT vEEG to r/o NCSE  > STAT load of 2 g Keppra, will hold on ativan for now due to secretions/hypoxia and concern this will over sedate. If seizures on EEG may need to escalate treatment  > agree with stopping cefepime  > CTA head and neck after EEG due to aphasia (does have hx of this though so may be worse insetting of neurotoxicity vs seizure) (ordered)  Hypoxia      Present on Admission:   Hypoxia    Total Time Today was 45 minutes in the following activities: Preparing to see the patient, discussed with primary, reviewing labs and/or imaging, reviewing records, performing a medically appropriate examination and/or evaluation, Counseling and educating the patient/family/caregiver, and Documenting clinical information in the electronic or other health record.  Complexity of medical decision making is high.            Thank you for allowing us  to participate in the care of your patient. Please contact Neurology team on Voalte with any questions or concerns.    NEURO 2  - please contact me directly via voalte from [phone] or page [phone]  - for any other STAT questions/concerns from [phone] please page neurology on call at [phone]      Francies Ireland, DO  ______________________________________________________________________________________________  Subjective    Chief complaint  AMS, jerking movements    History Of Present Illness  Javier Gutierrez is a 83 y.o. male presenting with PNA, was started on cefepime and yesterday developed AMS, decreased verbal output and jerking movements.  Pt unable to provide any hx due to mental status.  Pt's family feels he has slowly worsened since yesterday, minimal verbal output, not consistently following commands.  No focal weakness.    Medical/Surgical/Social/Family History{  Past Medical History:    Acute on chronic systolic heart failure, NYHA class 2 (CMS-HCC)    Amyloidosis (CMS-HCC)    Aortic valve stenosis, mild    Arrhythmia    Arterial occlusion    Arthritis    Asthma    CAD (coronary artery disease)    Cardiomyopathy (CMS-HCC)    Carotid artery plaque    Cataract    CHF (congestive heart failure) (CMS-HCC)    Chronic kidney disease    Colon polyps    COPD (chronic  obstructive pulmonary disease) (CMS-HCC)    Coronary atherosclerosis    Dizziness    Dyspnea    Frailty    H/O: CVA (cardiovascular accident)    History of renal artery stenosis    HTN    Hyperlipidemia    Hyperlipidemia    Hypertension    Hypothyroidism    Kidney disease    Kidney stones    Lung disease    Peripheral vascular disease    Tobacco abuse    Valvular heart disease     Surgical History:   Procedure Laterality Date    HX LUMBAR DISKECTOMY  01/22/1966    30 years ago    STENT INTRAVASCULAR  01/23/2004    kidney stent placed    Left Heart Catheterization With Ventriculogram Left 03/11/2015    Performed by Adrien Alberta, MD, [facility] at [facility] CATH LAB    Coronary Angiography N/A 03/11/2015 Performed by Adrien Alberta, MD, [facility] at [facility] CATH LAB    Possible Percutaneous Coronary Intervention N/A 03/11/2015    Performed by Adrien Alberta, MD, [facility] at [facility] CATH LAB    ESOPHAGOGASTRODUODENOSCOPY N/A 04/26/2015    Performed by Leonia Raman, DO at [facility] ENDO    ESOPHAGOGASTRODUODENOSCOPY BIOPSY  04/26/2015    Performed by Leonia Raman, DO at [facility] ENDO    REPLACEMENT TRANSCATHETER AORTIC VALVE (Sapien 26s3), right common femoral artery approach N/A 08/03/2015    Performed by Jerrald Moose, MD at [facility] CVOR    BRONCHOSCOPY Bilateral 11/30/2016    Performed by Daisy Du, MD at [facility] OR    BRONCHOSCOPY WITH BRONCHIAL ALVEOLAR LAVAGE - FLEXIBLE Right 11/30/2016    Performed by Daisy Du, MD at [facility] OR    ESOPHAGOGASTRODUODENOSCOPY for PEG placement N/A 12/06/2016    Performed by Buckles, Tino Foreman, MD at [facility] ENDO    ENDARTERECTOMY CAROTID ARTERY Left 02/17/2018    Performed by Montgomery Apgar, MD at [facility] OR    ANOMALOUS VENOUS RETURN REPAIR  2017    ECHOCARDIOGRAM PROCEDURE  05/2022    HX CATARACT REMOVAL  2018    HX HEART CATHETERIZATION  2015    KIDNEY STONE SURGERY  2025     Social History     Tobacco Use    Smoking status: Former     Current packs/day: 0.00     Average packs/day: 1.5 packs/day for 60.0 years (90.0 ttl pk-yrs)     Types: Cigarettes     Start date: 02/13/1958     Quit date: 02/13/2018     Years since quitting: 5.3    Smokeless tobacco: Never    Tobacco comments:     0.5-2 ppd history   Vaping Use    Vaping status: Never Used   Substance and Sexual Activity    Alcohol use: Not Currently    Drug use: Never    Sexual activity: Not Currently     Partners: Female           Family History   Problem Relation Name Age of Onset    Stroke Mother Virginia  Marchant     Other Mother Virginia  Heemstra         colon cancer    Other Father          valve disease/COPD    Aortic Disease/Dissection Father      Cancer Mother Virginia  Bines         Colon    Heart Disease Father Paul Rasmus  Allergies: Tamsulosin  Current Medications    Medication Directions   albuterol 0.083% (PROVENTIL) 2.5 mg /3 mL (0.083 %) nebulizer solution Inhale 3 mL solution by nebulizer as directed twice daily as needed.   apixaban (ELIQUIS) 2.5 mg tablet TAKE 1 TABLET BY MOUTH TWO TIMES A DAY   atorvastatin (LIPITOR) 20 mg tablet Take one tablet by mouth daily.   budesonide (PULMICORT) 0.5 mg/2 mL nebulizer solution Inhale 2 mL solution by nebulizer as directed twice daily.   clopiDOGrel (PLAVIX) 75 mg tablet Take one tablet by mouth daily.   dutasteride (AVODART) 0.5 mg PO capsule Take one capsule by mouth daily.   food supplemt, lactose-reduced (ENSURE PLUS) 0.05 gram- 1.5 kcal/mL oral liquid Take 1 Can by mouth twice daily with meals.   formoterol fumarate (PERFOROMIST) 20 mcg/2 mL nebulizer vial Inhale 2 mL solution by nebulizer as directed twice daily.   furosemide (LASIX) 20 mg tablet Take one tablet by mouth daily as needed.   levothyroxine (SYNTHROID) 88 mcg tablet Take one tablet by mouth daily 30 minutes before breakfast.   metoprolol succinate XL (TOPROL XL) 25 mg extended release tablet Take one-half tablet by mouth daily.   montelukast (SINGULAIR) 10 mg tablet Take one tablet by mouth at bedtime daily.   MYRBETRIQ 25 mg tablet Take one tablet by mouth daily.   other medication Prevagen Regular Strength - Take 1 capsule by mouth daily   pantoprazole DR (PROTONIX) 40 mg tablet Take one tablet by mouth daily.   predniSONE (DELTASONE) 10 mg tablet 20 mg today, 10 mg for 3 days, then stop.       Objective                        Vital Signs: Last Filed                 Vital Signs: 24 Hour Range   BP: 137/79 (05/22 1517)  Temp: 36.4 ?C (97.6 ?F) (05/22 1517)  Pulse: 96 (05/22 1517)  Respirations: 18 PER MINUTE (05/22 1517)  SpO2: 95 % (05/22 1517)  O2 Device: None (Room air) (05/22 1517) BP: (112-156)/(78-96)   Temp:  [36.3 ?C (97.3 ?F)-36.8 ?C (98.2 ?F)]   Pulse:  [84-96]   Respirations:  [16 PER MINUTE-18 PER MINUTE]   SpO2: [94 %-98 %]   O2 Device: None (Room air)     Vitals:    06/06/23 1442 06/07/23 1445   Weight: 72.6 kg (160 lb) 70 kg (154 lb 5.2 oz)         Intake/Output Summary:  (Last 24 hours)    Intake/Output Summary (Last 24 hours) at 06/13/2023 1617  Last data filed at 06/13/2023 0849  Gross per 24 hour   Intake 550 ml   Output --   Net 550 ml     Stool Occurrence: 0        Physical Exam  General physical exam:  HEENT: normocephalic, eyes open with no discharge, nares patent, oropharynx is clear with no lesions, palate intact    Alert, eyes open, tracks some, inattentive, follows simple commands inconsistently, stated no once to a question but beyond that no verbal output. No drift in all extremities, full strength exam limited, no gaze deviation.  Not able to name objects due to mental status vs expressive aphasia    Lab/Radiology/Other Diagnostic Tests:  Results for orders placed or performed during the hospital encounter of 06/06/23 (from the past 24 hours)   COMPREHENSIVE  METABOLIC PANEL    Collection Time: 06/13/23  6:44 AM   Result Value Ref Range    Sodium 136 (L) 137 - 147 mmol/L    Potassium 4.4 3.5 - 5.1 mmol/L    Chloride 101 98 - 110 mmol/L    Glucose 69 (L) 70 - 100 mg/dL    Blood Urea Nitrogen 13 7 - 25 mg/dL    Creatinine 6.04 5.40 - 1.24 mg/dL    Calcium 8.2 (L) 8.5 - 10.6 mg/dL    Total Protein 5.5 (L) 6.0 - 8.0 g/dL    Total Bilirubin 0.6 0.2 - 1.3 mg/dL    Albumin 2.8 (L) 3.5 - 5.0 g/dL    Alk Phosphatase 38 25 - 110 U/L    AST 8 7 - 40 U/L    ALT 9 7 - 56 U/L    CO2 29 21 - 30 mmol/L    Anion Gap 6 3 - 12    Glomerular Filtration Rate (GFR) >60 >60 mL/min   CBC AND DIFF    Collection Time: 06/13/23  6:44 AM   Result Value Ref Range    White Blood Cells 6.20 4.50 - 11.00 10*3/uL    Red Blood Cells 4.81 4.40 - 5.50 10*6/uL    Hemoglobin 13.1 (L) 13.5 - 16.5 g/dL    Hematocrit 98.1 (L) 40.0 - 50.0 %    MCV 80.8 80.0 - 100.0 fL    MCH 27.2 26.0 - 34.0 pg    MCHC 33.7 32.0 - 36.0 g/dL    RDW 19.1 (H) 47.8 - 15.0 %    Platelet Count 93 (L) 150 - 400 10*3/uL    MPV 8.8 7.0 - 11.0 fL    Neutrophils 60.1 41.0 - 77.0 %    Lymphocytes 22.3 (L) 24.0 - 44.0 %    Monocytes 13.4 (H) 4.0 - 12.0 %    Eosinophils 3.7 0.0 - 5.0 %    Basophils 0.5 0.0 - 2.0 %    Absolute Neutrophil Count 3.80 1.80 - 7.00 10*3/uL    Absolute Lymph Count 1.40 1.00 - 4.80 10*3/uL    Absolute Monocyte Count 0.80 0.00 - 0.80 10*3/uL    Absolute Eosinophil Count 0.20 0.00 - 0.45 10*3/uL    Absolute Basophil Count 0.00 0.00 - 0.20 10*3/uL     Radiology  (Last 24 hours)                 06/13/23 1207  CT HEAD WO CONTRAST Final result    Impression:          1.  Stable exam without acute intracranial hemorrhage or obvious acute intracranial pathology.   2.  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   3.  Grossly stable cerebral volume loss and moderate patchy supratentorial white matter hypodensities, likely due to chronic microvascular ischemia.   4.  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            Finalized by Reggy Capers, M.D. on 06/13/2023 12:14 PM. Dictated by Reggy Capers, M.D. on 06/13/2023 12:09 PM.               I personally reviewed the following CT and my impression is no acute hemorrhage or evidence of stroke    24-hour labs:    Results for orders placed or performed during the hospital encounter of 06/06/23 (from the past 24 hours)   COMPREHENSIVE METABOLIC PANEL    Collection Time: 06/13/23  6:44 AM  Result Value Ref Range    Sodium 136 (L) 137 - 147 mmol/L    Potassium 4.4 3.5 - 5.1 mmol/L    Chloride 101 98 - 110 mmol/L    Glucose 69 (L) 70 - 100 mg/dL    Blood Urea Nitrogen 13 7 - 25 mg/dL    Creatinine 4.09 8.11 - 1.24 mg/dL    Calcium 8.2 (L) 8.5 - 10.6 mg/dL    Total Protein 5.5 (L) 6.0 - 8.0 g/dL    Total Bilirubin 0.6 0.2 - 1.3 mg/dL    Albumin 2.8 (L) 3.5 - 5.0 g/dL    Alk Phosphatase 38 25 - 110 U/L    AST 8 7 - 40 U/L    ALT 9 7 - 56 U/L    CO2 29 21 - 30 mmol/L    Anion Gap 6 3 - 12    Glomerular Filtration Rate (GFR) >60 >60 mL/min   CBC AND DIFF    Collection Time: 06/13/23  6:44 AM   Result Value Ref Range    White Blood Cells 6.20 4.50 - 11.00 10*3/uL    Red Blood Cells 4.81 4.40 - 5.50 10*6/uL    Hemoglobin 13.1 (L) 13.5 - 16.5 g/dL    Hematocrit 91.4 (L) 40.0 - 50.0 %    MCV 80.8 80.0 - 100.0 fL    MCH 27.2 26.0 - 34.0 pg    MCHC 33.7 32.0 - 36.0 g/dL    RDW 78.2 (H) 95.6 - 15.0 %    Platelet Count 93 (L) 150 - 400 10*3/uL    MPV 8.8 7.0 - 11.0 fL    Neutrophils 60.1 41.0 - 77.0 %    Lymphocytes 22.3 (L) 24.0 - 44.0 %    Monocytes 13.4 (H) 4.0 - 12.0 %    Eosinophils 3.7 0.0 - 5.0 %    Basophils 0.5 0.0 - 2.0 %    Absolute Neutrophil Count 3.80 1.80 - 7.00 10*3/uL    Absolute Lymph Count 1.40 1.00 - 4.80 10*3/uL    Absolute Monocyte Count 0.80 0.00 - 0.80 10*3/uL    Absolute Eosinophil Count 0.20 0.00 - 0.45 10*3/uL    Absolute Basophil Count 0.00 0.00 - 0.20 10*3/uL

## 2023-06-14 VITALS — BP 181/97 | HR 104 | Temp 97.30000°F

## 2023-06-14 VITALS — HR 79

## 2023-06-14 VITALS — BP 163/84 | HR 82 | Temp 98.60000°F

## 2023-06-14 VITALS — BP 134/72

## 2023-06-14 VITALS — BP 172/77 | HR 77 | Temp 97.20000°F

## 2023-06-14 VITALS — BP 134/66

## 2023-06-14 VITALS — HR 99

## 2023-06-14 VITALS — BP 113/98 | HR 86 | Temp 97.50000°F

## 2023-06-14 VITALS — HR 87

## 2023-06-14 VITALS — HR 94

## 2023-06-14 VITALS — BP 104/58 | HR 78 | Temp 98.40000°F

## 2023-06-14 VITALS — HR 95

## 2023-06-14 VITALS — BP 127/75 | HR 94 | Temp 98.50000°F

## 2023-06-14 LAB — CBC AND DIFF: ~~LOC~~ BKR WBC COUNT: 6 10*3/uL (ref 4.50–11.00)

## 2023-06-14 LAB — COMPREHENSIVE METABOLIC PANEL: ~~LOC~~ BKR SODIUM, SERUM: 131 mmol/L — ABNORMAL LOW (ref 137–147)

## 2023-06-14 MED ORDER — LABETALOL 5 MG/ML IV SOLN
10 mg | INTRAVENOUS | 0 refills | Status: DC | PRN
Start: 2023-06-14 — End: 2023-06-17

## 2023-06-14 MED ORDER — LABETALOL 5 MG/ML IV SOLN
10 mg | Freq: Once | INTRAVENOUS | 0 refills | Status: CP
Start: 2023-06-14 — End: ?
  Administered 2023-06-14: 11:00:00 10 mg via INTRAVENOUS

## 2023-06-14 NOTE — Progress Notes
 Neurology Progress Note    Name: Javier Gutierrez   MRN: [id number]     DOB: [birth year]      Age: 83 y.o.  Admission Date: 06/06/2023     LOS: 8 days     Date of Service: 06/14/2023    Assessment & Plan  Transient alteration of awareness  > since yesterday morning has had decreased verbal output, as well as jerking movements.  He has BUE, R>L jerking movements, myoclonic in nature.  He is able to follow commands intermittently, but no verbal output. Impaired attention.  Concern is for NCSE and/or cefepime neurotoxicity.  CTH was negative so stroke less likely since sx ongoing for close to 24 hours and no other weakness or other sx.    Recs:    > pt much improved today, suspect cefepime neurotoxicity. No seizures. Myoclonus also improved, this could be due to cefepime and/or levofloxacin  > agree with stopping cefepime  > can hold on CTA head and neck since he has improved    Neurology will sign off.  Please reach out again if any changes.    Blain Bulls, DO  Voalte or Page [phone]    Hypoxia      Present on Admission:   Hypoxia     Total Time Today was 30 minutes in the following activities: Preparing to see the patient, reviewing labs and/or imaging, reviewed eeg report, discussed with primary, reviewing records, performing a medically appropriate examination and/or evaluation, Counseling and educating the patient/family/caregiver, and Documenting clinical information in the electronic or other health record.  Complexity of medical decision making is high.                             Thank you for allowing us  to participate in the care of your patient. Please contact Neurology team on Voalte with any questions or concerns.    NEURO 2  - please contact me directly via voalte from [phone] or page [phone]  - for any other STAT questions/concerns from [phone] please page neurology on call at [phone]    _____________________________________________________________________  Subjective    Javier Gutierrez is a 83 y.o. male. Patient feels better today, denies any pain.  He doesn't remember much from yesterday.    Objective   Vital Signs: Last Filed In 24 Hours Vital Signs: 24 Hour Range   BP: 104/58 (05/23 1131)  Temp: 36.9 ?C (98.4 ?F) (05/23 1131)  Pulse: 79 (05/23 1256)  Respirations: 18 PER MINUTE (05/23 1256)  SpO2: 96 % (05/23 1256)  O2 Device: None (Room air) (05/23 1256) BP: (104-181)/(58-97)   Temp:  [36.2 ?C (97.1 ?F)-36.9 ?C (98.4 ?F)]   Pulse:  [77-104]   Respirations:  [18 PER MINUTE]   SpO2:  [95 %-97 %]   O2 Device: None (Room air)   Intensity Pain Scale (Self Report): 0 (06/13/23 2010)      Physical Exam                       Vital Signs: Last Filed                 Vital Signs: 24 Hour Range   BP: 104/58 (05/23 1131)  Temp: 36.9 ?C (98.4 ?F) (05/23 1131)  Pulse: 79 (05/23 1256)  Respirations: 18 PER MINUTE (05/23 1256)  SpO2: 96 % (05/23 1256)  O2 Device: None (Room air) (05/23 1256) BP: (104-181)/(58-97)  Temp:  [36.2 ?C (97.1 ?F)-36.9 ?C (98.4 ?F)]   Pulse:  [77-104]   Respirations:  [18 PER MINUTE]   SpO2:  [95 %-97 %]   O2 Device: None (Room air)   Intensity Pain Scale (Self Report): 0 (06/13/23 2010) Vitals:    06/06/23 1442 06/07/23 1445   Weight: 72.6 kg (160 lb) 70 kg (154 lb 5.2 oz)         Intake/Output Summary:  (Last 24 hours)    Intake/Output Summary (Last 24 hours) at 06/14/2023 1552  Last data filed at 06/14/2023 0500  Gross per 24 hour   Intake 0 ml   Output --   Net 0 ml     Stool Occurrence: 0        Physical Exam  General physical exam:  HEENT: normocephalic, eyes open with no discharge, nares patent, oropharynx is clear with no lesions, palate intact     Alert, oriented, following commands, no aphasia or dysarthria, naming intact  Has mild BUE asterixis still but improved  BUE/BLE 5/5  Sensation intact to LT  No dysmetria or CN deficit    Scheduled Meds:albuterol-ipratropium (DUONEB) nebulizer solution 3 mL, 3 mL, Inhalation, QID & PRN  apixaban (ELIQUIS) tablet 2.5 mg, 2.5 mg, Oral, BID  atorvastatin (LIPITOR) tablet 20 mg, 20 mg, Oral, QDAY  budesonide (PULMICORT) nebulizer solution 0.5 mg, 0.5 mg, Inhalation, BID  clopiDOGreL (PLAVIX) tablet 75 mg, 75 mg, Oral, QDAY  guaiFENesin LA (MUCINEX) tablet 600 mg, 600 mg, Oral, BID  levoFLOXacin  (LEVAQUIN) 750 mg/D5W 150 mL IVPB, 750 mg, Intravenous, Q48H*  levothyroxine (SYNTHROID) tablet 88 mcg, 88 mcg, Oral, QDAY 30 min before breakfast  lisinopriL (ZESTRIL) tablet 2.5 mg, 2.5 mg, Oral, BID  metoprolol succinate XL (TOPROL XL) tablet 25 mg, 25 mg, Oral, QDAY  montelukast (SINGULAIR) tablet 10 mg, 10 mg, Oral, QHS  pantoprazole DR (PROTONIX) tablet 40 mg, 40 mg, Oral, QDAY  sodium chloride (NEBUSAL) 3 % nebulizer solution 4 mL, 4 mL, Inhalation, BID    Continuous Infusions:   dextrose 5%-lactated ringers infusion 50 mL/hr at 06/13/23 2014     PRN and Respiratory Meds:albuterol 0.083% QID PRN, dextrose 50% (D50) IV PRN, melatonin QHS PRN, nystatin BID PRN, ondansetron Q6H PRN **OR** ondansetron (ZOFRAN) IV Q6H PRN, polyethylene glycol 3350 QDAY PRN, sennosides-docusate sodium QDAY PRN       Lab/Radiology/Other Diagnostic Tests:  Results for orders placed or performed during the hospital encounter of 06/06/23 (from the past 24 hours)   CBC AND DIFF    Collection Time: 06/14/23  6:52 AM   Result Value Ref Range    White Blood Cells 6.00 4.50 - 11.00 10*3/uL    Red Blood Cells 4.75 4.40 - 5.50 10*6/uL    Hemoglobin 12.6 (L) 13.5 - 16.5 g/dL    Hematocrit 53.6 64.4 - 50.0 %    MCV 84.5 80.0 - 100.0 fL    MCH 26.5 26.0 - 34.0 pg    MCHC 31.3 (L) 32.0 - 36.0 g/dL    RDW 03.4 (H) 74.2 - 15.0 %    Platelet Count 94 (L) 150 - 400 10*3/uL    MPV 8.4 7.0 - 11.0 fL    Neutrophils 69.7 41.0 - 77.0 %    Lymphocytes 14.9 (L) 24.0 - 44.0 %    Monocytes 12.5 (H) 4.0 - 12.0 %    Eosinophils 2.5 0.0 - 5.0 %    Basophils 0.4 0.0 - 2.0 %    Absolute Neutrophil Count 4.20  1.80 - 7.00 10*3/uL    Absolute Lymph Count 0.90 (L) 1.00 - 4.80 10*3/uL    Absolute Monocyte Count 0.80 0.00 - 0.80 10*3/uL    Absolute Eosinophil Count 0.10 0.00 - 0.45 10*3/uL    Absolute Basophil Count 0.00 0.00 - 0.20 10*3/uL   COMPREHENSIVE METABOLIC PANEL    Collection Time: 06/14/23  6:52 AM   Result Value Ref Range    Sodium 131 (L) 137 - 147 mmol/L    Potassium 4.0 3.5 - 5.1 mmol/L    Chloride 102 98 - 110 mmol/L    Glucose 83 70 - 100 mg/dL    Blood Urea Nitrogen 15 7 - 25 mg/dL    Creatinine 1.61 0.96 - 1.24 mg/dL    Calcium 7.7 (L) 8.5 - 10.6 mg/dL    Total Protein 5.3 (L) 6.0 - 8.0 g/dL    Total Bilirubin 0.6 0.2 - 1.3 mg/dL    Albumin 2.2 (L) 3.5 - 5.0 g/dL    Alk Phosphatase 35 25 - 110 U/L    AST 12 7 - 40 U/L    ALT 11 7 - 56 U/L    CO2 20 (L) 21 - 30 mmol/L    Anion Gap 9 3 - 12    Glomerular Filtration Rate (GFR) >60 >60 mL/min         24-hour labs:    Results for orders placed or performed during the hospital encounter of 06/06/23 (from the past 24 hours)   CBC AND DIFF    Collection Time: 06/14/23  6:52 AM   Result Value Ref Range    White Blood Cells 6.00 4.50 - 11.00 10*3/uL    Red Blood Cells 4.75 4.40 - 5.50 10*6/uL    Hemoglobin 12.6 (L) 13.5 - 16.5 g/dL    Hematocrit 04.5 40.9 - 50.0 %    MCV 84.5 80.0 - 100.0 fL    MCH 26.5 26.0 - 34.0 pg    MCHC 31.3 (L) 32.0 - 36.0 g/dL    RDW 81.1 (H) 91.4 - 15.0 %    Platelet Count 94 (L) 150 - 400 10*3/uL    MPV 8.4 7.0 - 11.0 fL    Neutrophils 69.7 41.0 - 77.0 %    Lymphocytes 14.9 (L) 24.0 - 44.0 %    Monocytes 12.5 (H) 4.0 - 12.0 %    Eosinophils 2.5 0.0 - 5.0 %    Basophils 0.4 0.0 - 2.0 %    Absolute Neutrophil Count 4.20 1.80 - 7.00 10*3/uL    Absolute Lymph Count 0.90 (L) 1.00 - 4.80 10*3/uL    Absolute Monocyte Count 0.80 0.00 - 0.80 10*3/uL    Absolute Eosinophil Count 0.10 0.00 - 0.45 10*3/uL    Absolute Basophil Count 0.00 0.00 - 0.20 10*3/uL   COMPREHENSIVE METABOLIC PANEL    Collection Time: 06/14/23  6:52 AM   Result Value Ref Range    Sodium 131 (L) 137 - 147 mmol/L    Potassium 4.0 3.5 - 5.1 mmol/L    Chloride 102 98 - 110 mmol/L    Glucose 83 70 - 100 mg/dL    Blood Urea Nitrogen 15 7 - 25 mg/dL    Creatinine 7.82 9.56 - 1.24 mg/dL    Calcium 7.7 (L) 8.5 - 10.6 mg/dL    Total Protein 5.3 (L) 6.0 - 8.0 g/dL    Total Bilirubin 0.6 0.2 - 1.3 mg/dL    Albumin 2.2 (L) 3.5 - 5.0 g/dL    Alk Phosphatase 35  25 - 110 U/L    AST 12 7 - 40 U/L    ALT 11 7 - 56 U/L    CO2 20 (L) 21 - 30 mmol/L    Anion Gap 9 3 - 12    Glomerular Filtration Rate (GFR) >60 >60 mL/min

## 2023-06-14 NOTE — Progress Notes
 END OF SHIFT/PLAN OF CARE NURSING NOTE    Admission Date: 06/06/2023  Length of Stay: LOS: 8 days    Acute events, pain management, and nursing interventions: No reports of pain overnight. Pt unable to take PO meds overnight, MD Chevis Cote notified. Pt's BP was elevated relieved by one time dose of labetalol, MD Zeeshan Sattar notified.     Communication with providers: MD Chevis Cote (see above). MD Zeeshan Sattar (see above).     Intake and Output:        Intake/Output Summary (Last 24 hours) at 06/14/2023 9629  Last data filed at 06/14/2023 0500  Gross per 24 hour   Intake 450 ml   Output --   Net 450 ml        Last Bowel Movement Date: 06/12/23    Fall Risk/JHFRAT Interventions and Education:   Elimination: incontinent/toilet  Medications: 2 or more  Bed/chair alarm: on  Patient Care Equipment: walker  Mobility: [id number]  Cognition: AOx1    Patient Education  Patient-specific education provided related to quality/safety:  Fall risk and Pressure injury care/prevention  Patient-specific education provided (other): medications  Learners: Patient and Family  Method/materials used: Therapist, art  Response to learning: TEFL teacher Understanding  Needs reinforcement on: n/a    Patient Goal(s):  Patient will Maintain stable fluid volume with clear breath sounds and vital signs within normal limit by the end of shift         Patient will  Verbalize readiness for discharge by discharge date   Other:    Restraints:  No  Restraint Goal: Patient will be free from injury while physically restrained.  See Docflowsheet for restraint documentation, interventions, education, etc.

## 2023-06-14 NOTE — Procedures
 VIDEO EEG REPORT    Javier Gutierrez  [birth year]  [id number]    Date of service: 06/14/23    History: This is a 83 y.o. male presenting with h/o CVA and aphasia with altered mental status.    Pertinent medications:     Current Facility-Administered Medications:     albuterol 0.083% (PROVENTIL) nebulizer solution 2.5 mg, 2.5 mg, Inhalation, QID PRN, Lizette Righter, MD    albuterol-ipratropium (DUONEB) nebulizer solution 3 mL, 3 mL, Inhalation, QID & PRN, Samanta, Ipsita, MD, 3 mL at 06/13/23 2136    apixaban (ELIQUIS) tablet 2.5 mg, 2.5 mg, Oral, BID, Dangers, Jonathan E, MD, 2.5 mg at 06/13/23 0849    atorvastatin (LIPITOR) tablet 20 mg, 20 mg, Oral, QDAY, Lizette Righter, MD, 20 mg at 06/13/23 0849    budesonide (PULMICORT) nebulizer solution 0.5 mg, 0.5 mg, Inhalation, BID, Lizette Righter, MD, 0.5 mg at 06/13/23 2141    clopiDOGreL (PLAVIX) tablet 75 mg, 75 mg, Oral, QDAY, Lizette Righter, MD, 75 mg at 06/13/23 0849    dextrose 5%-lactated ringers infusion, , Intravenous, Continuous, Rajab, Rawan A, MD, Last Rate: 50 mL/hr at 06/13/23 2014, New Bag at 06/13/23 2014    dextrose 50% (D50) syringe 25-50 mL, 12.5-25 g, Intravenous, PRN, Rajab, Rawan A, MD    guaiFENesin LA (MUCINEX) tablet 600 mg, 600 mg, Oral, BID, Rajab, Rawan A, MD, 600 mg at 06/13/23 0849    levoFLOXacin  (LEVAQUIN) 750 mg/D5W 150 mL IVPB, 750 mg, Intravenous, Q48H*, Rajab, Rawan A, MD, 750 mg at 06/13/23 1347    levothyroxine (SYNTHROID) tablet 88 mcg, 88 mcg, Oral, QDAY 30 min before breakfast, Lizette Righter, MD, 88 mcg at 06/13/23 0618    lisinopriL (ZESTRIL) tablet 2.5 mg, 2.5 mg, Oral, BID, Rajab, Rawan A, MD, 2.5 mg at 06/13/23 0849    melatonin tablet 5 mg, 5 mg, Oral, QHS PRN, Lizette Righter, MD    metoprolol succinate XL (TOPROL XL) tablet 25 mg, 25 mg, Oral, QDAY, Rajab, Rawan A, MD, 25 mg at 06/13/23 0849    montelukast (SINGULAIR) tablet 10 mg, 10 mg, Oral, QHS, Lizette Righter, MD, 10 mg at 06/12/23 2051    nystatin (NYSTOP) topical powder, , Topical, BID PRN, Dobler, Victor Grapes, MD, Given at 06/08/23 2150    ondansetron (ZOFRAN ODT) rapid dissolve tablet 4 mg, 4 mg, Oral, Q6H PRN **OR** ondansetron HCL (PF) (ZOFRAN (PF)) injection 4 mg, 4 mg, Intravenous, Q6H PRN, Lizette Righter, MD    pantoprazole DR (PROTONIX) tablet 40 mg, 40 mg, Oral, QDAY, Lizette Righter, MD, 40 mg at 06/13/23 0849    polyethylene glycol 3350 (MIRALAX) packet 17 g, 1 packet, Oral, QDAY PRN, Lizette Righter, MD    sennosides-docusate sodium (SENOKOT-S) tablet 1 tablet, 1 tablet, Oral, QDAY PRN, Lizette Righter, MD    sodium chloride (NEBUSAL) 3 % nebulizer solution 4 mL, 4 mL, Inhalation, BID, Rajab, Rawan A, MD, 4 mL at 06/13/23 0844    Introduction:  This study was performed using digital electroencephalographic recording equipment. International 10-20 electrode placement was used along with FT9 and FT10 electrodes. The record was obtained with the patient in the awake and asleep states.    Description:   In the most awake or stimulated state the background consists of admixture of generalized delta with intermixed theta range slowing which is reactive.  Posterior dominant rhythm or sleep architecture is not visualized.     Technical interpretation:   This EEG is abnormal due to   Continuous generalized delta with  intermixed theta range slowing.     Clinical correlation:   This abnormal video EEG is indicative of mild to moderate encephalopathy.  No epileptiform activity is noticed.    This report reflects the recording period from 1718 on 06/13/23 to 0746 on 06/14/23.    Dellar Fenton MD

## 2023-06-14 NOTE — Progress Notes
 Patient disconnected from VEEG by A.S without complication.

## 2023-06-14 NOTE — Progress Notes
 CLINICAL NUTRITION                                                        Clinical Nutrition Initial Assessment    Name: Javier Gutierrez   MRN: [id number]     DOB: 09-Dec-1940      Age: 83 y.o.  Admission Date: 06/06/2023     LOS: 8 days     Date of Service: 06/14/2023        Recommendation:  Least restrictive diet per SLP/primary team (currently  on minced and moist, moderately thickened liquids).   Offer Ensure Plus High Protein BID (thickened per SLP recs) and encourage PO intake.   Will also order Magic Cup daily.   Monitor wt, intake, intake tolerance, and lab data.    Comments:  Javier Gutierrez is an 83 y/o M with PMH of known asthma/COPD, HFrEF, A-fib (on Eliquis), HTN, PAD, CVA with expressive aphasia, CKD3, who presents with progressive shortness of breath and hypoxia. Labs and meds reviewed. Noted Na 131. Corrected Ca WNL.     Pt busy x 2. RD discussed with RN who reports pt's diet just advanced from NPO to dysphagia diet this morning. Pending intakes to evaluate if they're adequate or not. RN denies the pt having any GI issues. Per EMR wt Hx, pt has had 8.4% loss of BW in 5 months (insignificant for timeframe),  6.8% loss of BW in 3 months (insignificant for timeframe), and 4.3% loss of BW in 1 month (insignificant for timeframe). Pt does have a hx of CHF, so unsure how much of this is d/t pt's fluctuations in fluid balance. Malnutrition eval pending. Pt appears at nutritional risk. Will continue to monitor.    Nutrition Assessment of Patient:  Admit Weight: 70 kg (Standing Scale);     BMI (Calculated): 22.14;    Pertinent Allergies/Intolerances: NKFA     Oral Diet Order: Minced and moist;Moderately thick liquids;       Current Energy Intake:  (pending; just advanced from NPO)           Weight Used for Calculation: 70 kg  Estimated Calorie Needs: 2100-2240 kcal (30-32 kcal/kg standing scale admit wt)  Estimated Protein Needs: 77-84 g (1.1-1.2 g protein/kg standing scale admit wt)    Malnutrition Assessment:   Evaluation pending          Nutrition Focused Physical Exam:   pending   Physical Assessment:        Pressure Injury: None noted     Comment: GI WDL, LBM 06/12/23    Nutrition Diagnosis:  Increased nutrient needs, specify: (kcal, protein)  Etiology: increased nutrient demands related to increased work of breathing  Signs & Symptoms: COPD, shortness of breath, hypoxia                      Intervention / Plan:  Ordered Ensure Plus High Protein PRN (thickened per SLP recs) and Magic Cup daily              Kiara Keep, MS, RDN, LD  Clinical Dietitian - Department of Clinical Nutrition  Available on Voalte

## 2023-06-14 NOTE — Progress Notes
 OCCUPATIONAL THERAPY  PROGRESS NOTE      Name: Javier Gutierrez   MRN: [id number]     DOB: 06-15-1940      Age: 83 y.o.  Admission Date: 06/06/2023     LOS: 8 days     Date of Service: 06/14/2023      Mobility  Patient Turn/Position: Chair  Mobility Level [facility] Highest Level of Mobility (JH-HLM): Stand (1 or more minutes)  Level of Assistance: Assist X1  Assistive Device: Walker  Activity Limited By: Fatigue;Weakness;Mental Status Variability    Subjective  Significant Hospital Events: Javier Gutierrez is an 83 y/o M with PMH of known asthma/COPD, HFrEF, A-fib (on Eliquis), HTN, PAD, CVA with expressive aphasia, CKD3, who presents with progressive shortness of breath and hypoxemia. He was found to have LLL pneumonia that was felt to not be adequately treated previously. Pulmonary was consulted and pt was started on IV Zosyn. Pt's sputum culture had no growth. Due to prior sputum culture growing pseudomonas it was recommended that pt continue pseudomonal coverage, he was de-escalated to cefepime + metronidazole. Of note, pt noted to have thrombocytopenia that was felt to be related to Zosyn. On 5/21, pt developed tremors which progressed to encephalopathy on 5/22. This was felt to be partly related to cefepime so this was discontinued and pt was switched to levofloxacin. CT head wo contrast showed no intracranial bleed. Neurology was consulted for further evaluation.  Comments: Contact: Rhinovirus  Mental / Cognitive: Alert;Follows commands  Pain: No complaint of pain;Demonstrates no signs of pain  Persons Present: Son  Comments: Ok to see per Charity fundraiser. Patient in bed and sleeping with incresed time to wake but once awake, agreeable to therapy. Session ended with patient in the chair with alarm on and all needs in reach    Home Living Situation  Lives With: Family;Has 24/7 assistance available;Receives assistance from family  Type of Home: House  Entry Stairs: No stairs  In-Home Stairs: No stairs  Bathroom Setup: Walk in shower  Patient Owned Equipment: Bathing: Paediatric nurse;Toilet: Grab bars;Walker with wheels;Wheelchair    Prior Level of Function  Level Of Independence: Assist needed for IADL;Assist needed for mobility related ADL  History of Falls in Past 3 Months: No  Comments: Patient lives with his daughter and son in law. Daughter works from home and reports that somebody is always with him when he's up and walking. 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    ADL's  LE Dressing Assist: Maximum Assist  LE Dressing Deficits: Don/Doff R Sock;Don/Doff L Sock    ADL Mobility  Bed Mobility: Supine to Sit: Moderate assist  Bed Mobility Comments: HOB elevated and increased time for motor planning. Mod assist for trunk  Transfer Type: Sit to stand  Transfer: Assistance Level: From;Bed;Moderate assist  Transfer: Assistive Device: Roller walker  Transfer: Type of Assistance: For balance;Requires extra time;For strength deficit  Other Transfer Type: Stand pivot  Other Transfer: Assistance Level: To;Bedside chair;Minimal assist  Other Transfer: Assistive Device: Roller walker  Other Transfer: Type of Assistance: For balance;For safety considerations;Requires extra time;For strength deficit;Verbal cues  End of Activity Status: Instructed patient to request assist with mobility;Instructed patient to use call light;Nursing notified;Up in chair  Transfer Comments: Patient with some slight tremors during mobility and minimal knee buckling. Per son, the tremors started with the adverse reaction to the antibitic but they have been better today.  Sitting Balance: Static sitting balance;Dynamic sitting balance;Minimal  assist  Standing Balance: Static standing balance;2 UE support;Minimal assist    Activity Tolerance  Endurance: 2/5 Tolerates 10-20 Minutes Exercise w/Multiple Rests    Cognition  Comprehension: Hard of Hearing  Expression: Increased Time for Expression  Social Interaction: Interacts in a Spontaneous,Cooperative Manner  Problem Solving: Decreased Judgment/Safety  Attention: Awake/Alert  Cognition Comment: followed commands,  Per daughter has a goofy personality    ROM  ROM Comments: ROM WFL for age    Strength / Tone  Strength Comments: generalized weakness from hospital stays    Education  Persons Educated: Patient/Family  Topics: Role of OT, Goals for Therapy;Home safety;Energy Conservation    Assessment  Assessment: Decreased ADL Status;Decreased UE Strength;Decreased Endurance    AM-PAC 6 Clicks Daily Activity Inpatient  Putting on and taking off regular lower body clothes: A Little  Bathing (Including washing, rinsing, drying): A Little  Toileting, which includes using toilet, bedpan, or urinal: A Little  Putting on and taking off regular upper body clothing: A Little  Taking care of personal grooming such as brushing teeth: A Little  Eating meals: None  Daily Activity Raw Score: 19  Standardized (T-scale) Score: 40.22    Plan  OT Frequency: 3-5x/week  OT Plan for Next Visit: functional mobility, strength, endurance, standing ADLs    ADL Goals  Patient Will Perform All ADL's: w/ Stand By Assist    Functional Transfer Goals  Pt Will Perform All Functional Transfers: w/ Stand By Assist    OT Discharge Recommendations  Recommendation: Home with consistent supervision/assistance  Recommendation for Therapy Post Discharge: Home health  Patient Currently Requires Equipment: Owns what is needed  Comments: Daughter was not present but patient's son was. Sounds like goal is to still take patient home. He did need more assistance with standing as he hasn't been up in a few days but anticipate that will improve with out of bed mobility since his mentation is bette r    Therapist: Eugune Hews, OTR/L [id number]  Date: 06/14/2023

## 2023-06-14 NOTE — Case Management (ED)
 Case Management Progress Note    NAME:Javier Gutierrez                          MRN: [id number]              DOB:Sep 15, 1940          AGE: 83 y.o.  ADMISSION DATE: 06/06/2023             DAYS ADMITTED: LOS: 8 days      Today's Date: 06/14/2023    PLAN: ongoing d/c planning     Expected Discharge Date: 06/17/2023   Is Patient Medically Stable: No, Please explain: still f/u on the increased confusion  Are there Barriers to Discharge? No      Pt is current with [facility] for Rn and PT. They will need Roc orders at time of d/c. Sending orders today since pt is anticipated to be a w/e discharge.  OT continues to follow and work with pt. The current d/c plan Is for home with [facility]                                                                  Nurse Case Manager Weekend Needs      Instructions for CM W/E Staff:  please call [facility] at d/c and send AVS.     Teaching Needs Prior to DC:  na    Expected delivery of any needed medication/supplies/equipment:  na    Agency Expected SOC and Services Planned:  48-72 hrs post d/c       INTERVENTION/DISPOSITION:  Discharge Planning              Discharge Planning: Home Health  Transportation              Does the Patient Need Case Management to Arrange Discharge Transport? (ex: facility, ambulance, wheelchair/stretcher, Medicaid, cab, other): No  Will the Patient Use Family Transport?: Yes  Transportation Name, Phone and Availability #1: [name] [phone]  Support              Support: Huddle/team update  Info or Referral              Information or Referral to Community Resources: No Needs Identified  Positive SDOH Domains and Potential Barriers                   Medication Needs              Medication Needs: No Needs Identified                                  Financial              Financial: No Needs Identified  Legal              Legal: No Needs Identified  Other              Other/None: No needs identified  Discharge Disposition Selected Continued Care - Admitted Since 06/06/2023       [facility]       Service Provider Services Address Phone Fax Patient Preferred  [facility] [place] [facility] [address], [place] [address] [phone] --                Ceclia Cohens Integrated Nurse Case Manager [facility]  Ph [phone]  Available on Voalte

## 2023-06-15 ENCOUNTER — Inpatient Hospital Stay: Admit: 2023-06-15 | Discharge: 2023-06-15 | Payer: MEDICARE

## 2023-06-15 VITALS — HR 81

## 2023-06-15 VITALS — BP 121/61 | HR 91 | Temp 97.70000°F

## 2023-06-15 VITALS — HR 88

## 2023-06-15 VITALS — HR 100

## 2023-06-15 VITALS — BP 127/55 | HR 98 | Temp 98.30000°F

## 2023-06-15 VITALS — BP 125/55 | HR 100 | Temp 99.30000°F

## 2023-06-15 VITALS — HR 102

## 2023-06-15 VITALS — BP 124/59 | HR 86 | Temp 97.70000°F

## 2023-06-15 VITALS — BP 150/84 | HR 91 | Temp 98.00000°F

## 2023-06-15 VITALS — BP 90/52 | HR 84 | Temp 98.40000°F

## 2023-06-15 VITALS — HR 91

## 2023-06-15 VITALS — HR 92

## 2023-06-15 VITALS — HR 84

## 2023-06-15 LAB — POC GLUCOSE
~~LOC~~ BKR POC GLUCOSE: 121 mg/dL — ABNORMAL HIGH (ref 70–100)
~~LOC~~ BKR POC GLUCOSE: 93 mg/dL (ref 70–100)

## 2023-06-15 NOTE — Progress Notes
 General Progress Note      Admission Date: 06/06/2023  LOS: 9 days                     Assessment/Plan:    Principal Problem:    Hypoxia  Active Problems:    Transient alteration of awareness    Javier Gutierrez is an 83 y/o M with PMH of known asthma/COPD, HFrEF, A-fib (on Eliquis), HTN, PAD, h/o CVA with expressive aphasia, CKD3, who presents with progressive shortness of breath and hypoxemia. He was found to have LLL pneumonia that was felt to not be adequately treated previously. Pulmonary was consulted and pt was started on IV Zosyn. Pt's sputum culture had no growth. Due to prior sputum culture growing pseudomonas it was recommended that pt continue pseudomonal coverage, pt noted to have thrombocytopenia that was felt to be related to Zosyn. He was de-escalated to cefepime + metronidazole. Of note, on 5/21, pt developed tremors which progressed to encephalopathy on 5/22. This was felt to be partly related to cefepime so this was discontinued and pt was switched to levofloxacin. CT head wo contrast showed no intracranial bleed. Neurology was consulted for further evaluation. Stat EEG did not show any epileptiform activity. Pt had improvement in mentation and therefore it was felt that this was due to cefepime.     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  - 5/21 AM pt noted to have mild tremors  - 5/21 evening pt lethargic, not following commands, more tremors noted  - 5/22 morning pt aphasic, with some secretions, notable myoclonus in bilateral upper and lower extremities, lethargic  - stat CT head wo contrast (5/22): no evidence of intracranial bleed  - received IV Keppra x 1 (5/22)  - stat EEG (5/22): no epileptiform activity  Plan:   - SLP re-consulted and evaluated at bedside on 5/23, okay to resume dysphagia diet (was on this previously)  - neurology has signed off  - Added cefepime to drug intolerance given high suspicion for being culprit to neurotoxicity    Left lower lobe pneumonia  RLL mucus plugging 2/2 aspiration  + Rhinovirus/enterovirus  Chronic bronchiectasis  Moderate dysphagia  - recent diagnosis of pseudomonal pneumonia in April 2025  - patient recently admitted at [facility] for pneumonia and treated with zosyn followed by a week long course of Levaquin through 5/12, daughter reports breathing again worsened a few days prior to arrival   - CTA chest on admission with LLL consolidation and development of bibasilar nodular opacities consistent with multifocal pneumonia and cental and peripheral bronchiectasis  - reports history of aspiration  - MRSA nares negative, legionella Ag negative, histoplasma Ag negative  - RVP + rhinovirus/enterovirus  - evaluated by SLP on 5/16 with concerns for moderate dysphagia  - sputum culture (5/17): GS with few gram positive cocci in singles and/or pairs, rare budding yeast  - Zosyn (5/15 - 5/19)  - cefepime (5/19 - 5/21)  Plan:   - pulmonary has signed off, plan for outpatient TeleHealth f/u with pulm on 5/29  - continue IV levofloxacin 750mg  Q48H x 2 doses (5/22 and 5/24)  - Continue PT budesonide, formoterolMucinex BID, DuoNebs Q4H and PRN, sodium chloride nebulizer BID  - appreciate RT help with suctioning   - follow blood cultures (5/15)    Acute moderate thrombocytopenia, possibly related to abx  - plt dropping this admission, down to 79 (5/21)  - felt to be due to Zosyn, discontinued as  above, however has been low since day of admission even prior to initiation of Zosyn (5/15)  - discussed with daughter who reported pt did receive about 4 days of Zosyn at outside hospital  - pt on Eliquis and has not been using heparin products  - INR just slightly elevated at 1.6  - peripheral smear (5/20): no qualitative morphologic abnormalities of diagnostic significant   Plan:   - Zosyn discontinued  - monitor CBC     History of asthma vs COPD with recent exacerbation  - patient recently treated for COPD exacerbation and had been on steroid taper  - continue PTA budesonide and albuterol PRN  - continue PTA montelukast  - completed steroid taper on 5/19  - has outpatient pulmonary establish care appt arranged with plan for outpatient PFTs in August 2025    Hypertension, uncontrolled  Chronic systolic heart failure  PAD s/p stent  CVA with expressive aphasia  Atrial fibrillation  - TTE 3/7: LVEF 45%. Segmental WMA. Mild LV diastolic dysfunction. Trace MR.  - BNP elevated on admission to 2297, patient appears euvolemic on exam and is at his dry weight of 164lbs  - hs troponin mildly elevated in ED 36->40->43, patient denies any chest pain  - EKG in ED with sinus tach and bifascicular block, noted on prior EKGs  Plan:   - as outpatient can discuss with cardiologist regarding increase in dose of apixaban (to 5mg  BID rather than 2.5mg  BID) as currently no clear indication for reduced dose  - cont PTA statin, Plavix  - cont PTA metoprolol succinate 12.5mg  daily > up-titrated to 25mg  daily beginning 5/18  - per pt's daughter pt has had labile blood pressures, especially one time after taking Flomax he had to be taken off of all anti-hypertensive meds and is now just beginning to titrate up  - continue lisinopril 2.5mg  BID based off of outpatient cardiology notes  - takes furosemide 20mg  daily PRN    CKD3  - Cr at baseline  - avoid nephrotoxic agents    Hypokalemia  - monitor lab and replace     Hypothyroidism: PTA levothyroxine     Diet: Diet Cardiac (Low Fat/Low Sodium)  VTE ppx: Eliquis  CODE: Full Code  DISPO: continue inpatient admission for encephalopathy, pneumonia, thrombocytopenia.  Neurological status continues to improve.  Currently patient requires significant amount of care but family as best as possible would like to manage at home quite some more familiar event department.  However they recognize that he has declined quite a bit in terms of strength.    Will request repeat physical therapy evaluation given his declining, however family more than likely will anticipate taking him home with home health    High medical decision making due to the following:  1 acute or chronic illness that poses a threat to life or bodily function  Review of notes outside of my specialty, Review of each unique test, and Ordering of each unique test    __________________________________________________________________________________________________________________________________    Subjective    Daughter at the bedside, he was sleeping on initial presentation he was not able to answer orientation questions as he repeatedly kept falling asleep.  He was able to answer after some time his name but not other orientation questions.  She reports this was atypical but this is drastically improved from where he was 48 hours ago where he was difficulty even arouse.  Overall the recognize he is a high aspiration risk but they continue to want to  feed a modified diet.  He has declined slowly but is possible they want to keep him at home.    Medications  Scheduled Meds:albuterol-ipratropium (DUONEB) nebulizer solution 3 mL, 3 mL, Inhalation, QID & PRN  apixaban (ELIQUIS) tablet 2.5 mg, 2.5 mg, Oral, BID  atorvastatin (LIPITOR) tablet 20 mg, 20 mg, Oral, QDAY  budesonide (PULMICORT) nebulizer solution 0.5 mg, 0.5 mg, Inhalation, BID  clopiDOGreL (PLAVIX) tablet 75 mg, 75 mg, Oral, QDAY  guaiFENesin LA (MUCINEX) tablet 600 mg, 600 mg, Oral, BID  levoFLOXacin  (LEVAQUIN) 750 mg/D5W 150 mL IVPB, 750 mg, Intravenous, Q48H*  levothyroxine (SYNTHROID) tablet 88 mcg, 88 mcg, Oral, QDAY 30 min before breakfast  lisinopriL (ZESTRIL) tablet 2.5 mg, 2.5 mg, Oral, BID  metoprolol succinate XL (TOPROL XL) tablet 25 mg, 25 mg, Oral, QDAY  montelukast (SINGULAIR) tablet 10 mg, 10 mg, Oral, QHS  pantoprazole DR (PROTONIX) tablet 40 mg, 40 mg, Oral, QDAY  sodium chloride (NEBUSAL) 3 % nebulizer solution 4 mL, 4 mL, Inhalation, BID    Continuous Infusions:      PRN and Respiratory Meds:albuterol 0.083% QID PRN, dextrose 50% (D50) IV PRN, labetalol (NORMODYNE; TRANDATE) injection Q2H PRN, melatonin QHS PRN, nystatin BID PRN, ondansetron Q6H PRN **OR** ondansetron (ZOFRAN) IV Q6H PRN, polyethylene glycol 3350 QDAY PRN, sennosides-docusate sodium QDAY PRN      Objective                       Vital Signs: Last Filed                 Vital Signs: 24 Hour Range   BP: 121/61 (05/24 1142)  Temp: 36.5 ?C (97.7 ?F) (05/24 1142)  Pulse: 92 (05/24 1243)  Respirations: 16 PER MINUTE (05/24 1243)  SpO2: 98 % (05/24 1243)  O2 Device: None (Room air) (05/24 1243) BP: (90-163)/(52-98)   Temp:  [36.4 ?C (97.5 ?F)-37 ?C (98.6 ?F)]   Pulse:  [81-99]   Respirations:  [16 PER MINUTE-18 PER MINUTE]   SpO2:  [92 %-99 %]   O2 Device: None (Room air)     Vitals:    06/06/23 1442 06/07/23 1445   Weight: 72.6 kg (160 lb) 70 kg (154 lb 5.2 oz)       Intake/Output Summary:  (Last 24 hours)    Intake/Output Summary (Last 24 hours) at 06/15/2023 1422  Last data filed at 06/15/2023 1142  Gross per 24 hour   Intake 530 ml   Output 0 ml   Net 530 ml           Physical Exam   General: Oriented to self, no acute distress, old, frail.  HEENT: normocephalic. Dried up blood around his nostrils.   Cardiovascular: regular rate and rhythm.  Lungs: respirations non-labored. CTA bilaterally.   Abdomen: non-distended.   Extremities: no deformity.   Neuro: alert, able to follow commands and track, speaks minimally (chronic, has expressive aphasia),       Lab Review  CBC w/Diff    Lab Results   Component Value Date/Time    WBC 5.30 06/15/2023 06:38 AM    RBC 4.83 06/15/2023 06:38 AM    HGB 12.7 (L) 06/15/2023 06:38 AM    HCT 39.0 (L) 06/15/2023 06:38 AM    MCV 80.8 06/15/2023 06:38 AM    MCH 26.2 06/15/2023 06:38 AM    MCHC 32.4 06/15/2023 06:38 AM    RDW 19.1 (H) 06/15/2023 06:38 AM    PLTCT 110 (L) 06/15/2023  06:38 AM    MPV 8.6 06/15/2023 06:38 AM    Lab Results   Component Value Date/Time    NEUT 58.0 06/15/2023 06:38 AM    ANC 3.10 06/15/2023 06:38 AM    LYMA 23.3 (L) 06/15/2023 06:38 AM    ALC 1.20 06/15/2023 06:38 AM    MONA 14.2 (H) 06/15/2023 06:38 AM    AMC 0.80 06/15/2023 06:38 AM    EOSA 3.1 06/15/2023 06:38 AM    AEC 0.20 06/15/2023 06:38 AM    BASA 1.4 06/15/2023 06:38 AM    ABC 0.10 06/15/2023 06:38 AM        Comprehensive Metabolic Profile    Lab Results   Component Value Date/Time    NA 135 (L) 06/15/2023 06:38 AM    K 4.1 06/15/2023 06:38 AM    CL 103 06/15/2023 06:38 AM    CO2 22 06/15/2023 06:38 AM    GAP 10 06/15/2023 06:38 AM    BUN 14 06/15/2023 06:38 AM    CR 1.29 (H) 06/15/2023 06:38 AM    GLU 98 06/15/2023 06:38 AM    Lab Results   Component Value Date/Time    CA 8.3 (L) 06/15/2023 06:38 AM    PO4 2.1 03/12/2023 05:49 AM    ALBUMIN 2.6 (L) 06/15/2023 06:38 AM    TOTPROT 5.3 (L) 06/15/2023 06:38 AM    ALKPHOS 38 06/15/2023 06:38 AM    AST 11 06/15/2023 06:38 AM    ALT 7 06/15/2023 06:38 AM    TOTBILI 0.5 06/15/2023 06:38 AM    GFR 55 (L) 06/15/2023 06:38 AM    GFRAA 48 (L) 06/29/2018 06:53 AM          Point of Care Testing  (Last 24 hours)  Glucose: 98  POC Glucose (Download): (!) 121    Radiology and other Diagnostics Review:  Pertinent radiology reviewed     Arna Lanes, DO

## 2023-06-16 VITALS — HR 99

## 2023-06-16 VITALS — BP 113/56 | HR 91

## 2023-06-16 VITALS — BP 142/52 | HR 96 | Temp 98.60000°F

## 2023-06-16 VITALS — BP 119/55 | Temp 97.40000°F

## 2023-06-16 VITALS — BP 114/54 | Temp 98.30000°F

## 2023-06-16 VITALS — BP 127/71 | HR 78 | Temp 97.40000°F

## 2023-06-16 VITALS — BP 136/69 | HR 93 | Temp 98.30000°F

## 2023-06-16 LAB — MANUAL DIFF
~~LOC~~ BKR EOSINOPHILS - RELATIVE: 3 % (ref 0–5)
~~LOC~~ BKR LYMPHOCYTES - RELATIVE: 19 % — ABNORMAL LOW (ref 24–44)
~~LOC~~ BKR METAMYELOCYTES - RELATIVE: 1 %
~~LOC~~ BKR MONOCYTES - RELATIVE: 8 % (ref 4–12)
~~LOC~~ BKR MYELOCYTES - RELATIVE: 2 %
~~LOC~~ BKR NEUT+BANDS - ABSOLUTE: 3.3 10*3/uL (ref 1.8–7.0)
~~LOC~~ BKR NEUTROPHILS - RELATIVE: 67 % (ref 41–77)

## 2023-06-16 LAB — COMPREHENSIVE METABOLIC PANEL
~~LOC~~ BKR AST: 13 U/L (ref 7–40)
~~LOC~~ BKR CO2: 28 mmol/L (ref 21–30)

## 2023-06-16 LAB — POC GLUCOSE
~~LOC~~ BKR POC GLUCOSE: 114 mg/dL — ABNORMAL HIGH (ref 70–100)
~~LOC~~ BKR POC GLUCOSE: 110 mg/dL — ABNORMAL HIGH (ref 70–100)
~~LOC~~ BKR POC GLUCOSE: 119 mg/dL — ABNORMAL HIGH (ref 70–100)

## 2023-06-16 MED ORDER — IPRATROPIUM-ALBUTEROL 0.5 MG-3 MG(2.5 MG BASE)/3 ML IN NEBU
3 mL | RESPIRATORY_TRACT | 0 refills | Status: DC | PRN
Start: 2023-06-16 — End: 2023-06-17
  Administered 2023-06-17 (×2): 3 mL via RESPIRATORY_TRACT

## 2023-06-16 MED ORDER — ALBUMIN, HUMAN 5 % IV SOLP
250 mL | INTRAVENOUS | 0 refills | Status: CP
Start: 2023-06-16 — End: ?
  Administered 2023-06-16 (×2): 250 mL via INTRAVENOUS

## 2023-06-16 MED ORDER — IPRATROPIUM-ALBUTEROL 0.5 MG-3 MG(2.5 MG BASE)/3 ML IN NEBU
3 mL | RESPIRATORY_TRACT | 0 refills | Status: DC | PRN
Start: 2023-06-16 — End: 2023-06-16

## 2023-06-16 NOTE — Progress Notes
 PHYSICAL THERAPY  ASSESSMENT      Name: Javier Gutierrez   MRN: [id number]     DOB: [birth year]      Age: 83 y.o.  Admission Date: 06/06/2023     LOS: 10 days     Date of Service: 06/16/2023      Mobility  Patient Turn/Position: Supine;Self  Mobility Level [facility] Highest Level of Mobility (JH-HLM): Walk 25 feet or more (to doorway/hallway)  Distance Walked (feet): 50 ft (+50)  Level of Assistance: Assist X1  Assistive Device: Walker  Activity Limited By: Weakness;Fatigue;Mental Status Variability    Subjective  Significant Hospital Events: Sylus Stgermain is an 83 y/o M with PMH of known asthma/COPD, HFrEF, A-fib (on Eliquis), HTN, PAD, CVA with expressive aphasia, CKD3, who presents with progressive shortness of breath and hypoxemia. He was found to have LLL pneumonia that was felt to not be adequately treated previously. Pulmonary was consulted and pt was started on IV Zosyn. Pt's sputum culture had no growth. Due to prior sputum culture growing pseudomonas it was recommended that pt continue pseudomonal coverage, he was de-escalated to cefepime + metronidazole. Of note, pt noted to have thrombocytopenia that was felt to be related to Zosyn. On 5/21, pt developed tremors which progressed to encephalopathy on 5/22. This was felt to be partly related to cefepime so this was discontinued and pt was switched to levofloxacin. CT head wo contrast showed no intracranial bleed. Neurology was consulted for further evaluation.  Comments: Contact: Rhinovirus  Mental / Cognitive: Alert;Follows commands;Cooperative  Pain: No complaint of pain;Demonstrates no signs of pain  Pain Interventions: Patient agrees to participate in therapy with current pain level;Patient assisted into position of comfort  Persons Present: Son  Comments: RN clears patient for session    Home Living Situation  Lives With: Family;Has 24/7 assistance available;Receives assistance from family  Type of Home: House  Entry Stairs: No stairs  In-Home Stairs: No stairs  Bathroom Setup: Walk in shower  Patient Owned Equipment: Bathing: Paediatric nurse;Toilet: Grab bars;Walker with wheels;Wheelchair    Prior Level of Function  Level Of Independence: Assist needed for IADL;Assist needed for mobility related ADL  History of Falls in Past 3 Months: No  Comments: Patient lives with his daughter and son in law. Daughter works from home and reports that somebody is always with him when he's up and walking. 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     ROM  R LE ROM: WFL  L LE ROM: WFL  ROM Comments: Per functional assessment    Strength  Overall Strength: Generalized weakness    Sensation/Tone/Coordination  Head Control: Independent  Posture: Rounded shoulders    Bed Mobility/Transfer  Bed Mobility: Supine to Sit: Standby Assist;Head of Bed Elevated  Bed Mobility: Sit to Supine: Standby Assist;HOB Elevated  Comments: Upon sitting EOB, patient with significant posterior lean. Able to correct with verbal/tactile cues and minA    Transfer Type: Sit to/from Stand (x2 reps)  Transfer: Assistance Level: To/From;Bed;Moderate Assist  Transfer: Assistive Device: Nurse, adult  Transfers: Type Of Assistance: Materials engineer;For Balance;For Strength Deficit;For Safety Considerations    End Of Activity Status: In Bed;Nursing Notified;Instructed Patient to Request Assist with Mobility;Instructed Patient to Use Call Light (Bed alarm on)    Balance  Sitting Balance: Static Sitting Balance;Dynamic Sitting Balance;2 UE Support;Standby Assist;Minimal Assist  Standing Balance: Static Standing Balance;Dynamic Standing Balance;2 UE support;Minimal Assist    Gait  Gait Distance: 50  feet (+50)  Gait: Assistance Level: Minimal Assist (CGA)  Gait: Assistive Device: Roller Walker  Gait: Descriptors: Decreased foot clearance RLE;Decreased foot clearance LLE;Forward trunk flexion;Pace: Slow;Decreased knee extension in stance phase RLE;Decreased knee extension in stance phase LLE;Swing-Through Gait;No balance loss;Decreased step length    Comments: Patient required variable level of assist (contact guard assistance to minimal assistance)    Activity Limited By: Weakness;Complaint of Fatigue    Education  Persons Educated: Patient;Family  Interventions: Repetition of Instructions;Family Education  Teaching Methods: Verbal Instruction  Patient Response: Verbalized Understanding  Topics: Plan/Goals of PT Interventions;Use of Assistive Device/Orthosis;Mobility Progression;Up with Assist Only;Importance of Increasing Activity;Ambulate With Nursing;Recommend Continued Therapy;Therapy Schedule    Assessment/Progress  Impaired Mobility Due To: Decreased Strength;Impaired Balance;Decreased Activity Tolerance;Deconditioning;Medical Status Limitation  Impaired Strength Due To: Decreased Activity Tolerance;Deconditioning  Assessment/Progress: Should Improve w/ Continued PT    Comments: Patient presents with generalized weakness, balance impairments, and endurance deficits. Requires Ax1 for all functional mobility. Would benefit from ongoing skilled PT intervention during acute stay to improve functional mobility.    AM-PAC 6 Clicks Basic Mobility Inpatient  Turning from your back to your side while in a flat bed without using bed rails: None  Moving from lying on your back to sitting on the side of a flat bed without using bedrails : A Little  Moving to and from a bed to a chair (including a wheelchair): A Lot  Standing up from a chair using your arms (e.g. wheelchair, or bedside chair): A Lot  To walk in hospital room: A Little  Climbing 3-5 steps with a railing: A Lot  Basic Mobility Inpatient Raw Score: 16  Standardized (T-scale) Score: 38.32  Mobility Goal [facility]: JH-HLM 5 Stand >= 1 min    Goals  Goal Formulation: With Patient/Family  Time For Goal Achievement: 3 days, To, 5 days  Patient Will Go Supine To/From Sit: Independently  Patient Will Transfer Bed/Chair: w/ Minimal Assist  Patient Will Transfer Sit to Stand: w/ Minimal Assist  Patient Will Ambulate: 101-150 Feet, w/ Walker, w/ Minimal Assist    Plan  Treatment Interventions: Mobility training;Strengthening;Balance activities;Endurance training  Plan Frequency: 1-2 Days per Week  PT Plan for Next Visit: Progress ambulation distance, LE strengthening repeated sit to stands    PT Discharge Recommendations  Recommendation: Home with consistent supervision/assistance  Recommendation for Therapy Post Discharge: Home health  Patient Currently Requires Physical Assist With: All mobility  Patient Currently Requires Supervision For: Mobility  Patient Currently Requires Equipment: Owns what is needed  Comments: Family prefers to take patient home.    Therapist  Grandville Lax, PT  Date  06/16/2023

## 2023-06-16 NOTE — Progress Notes
 RT Adult Assessment Note    NAME:Javier Gutierrez             MRN: [id number]             DOB:25-Dec-1940          AGE: 83 y.o.  ADMISSION DATE: 06/06/2023             DAYS ADMITTED: LOS: 10 days    Additional Comments:  Impressions of the patient: pt resting on room air   Intervention(s)/outcome(s): nebs, Vibrapep, sx prn        Vital Signs:  Pulse: 91  RR: 16 PER MINUTE  SpO2: 96 %  O2 Device: None (Room air)  Liter Flow:    O2%:      Breath Sounds:   All Breath Sounds: Rhonchi  Respiratory Effort:      Comments:

## 2023-06-16 NOTE — Progress Notes
 END OF SHIFT/PLAN OF CARE NURSING NOTE    Admission Date: 06/06/2023  Length of Stay: LOS: 10 days    Acute events, pain management, and nursing interventions:     No acute events    Patient not endorsing pain     Communication with providers: None     Intake and Output:        Intake/Output Summary (Last 24 hours) at 06/16/2023 0458  Last data filed at 06/15/2023 1142  Gross per 24 hour   Intake 480 ml   Output 0 ml   Net 480 ml        Last Bowel Movement Date: 06/12/23    Fall Risk/JHFRAT Interventions and Education:   Elimination: incontinent  Medications: Blood pressure, blood thinner  Bed/chair alarm: Yes  Patient Care Equipment: Tele  Mobility: High fall risk due to age, meds, frequency/urgency, equipment, and weakness  Cognition: A&O x 2 to 3    Patient Education  Patient-specific education provided related to quality/safety:  Pain scale , Incontinence management, and Pharmacological pain management  Patient-specific education provided (other): Medications  Learners: Patient  Method/materials used: Verbal teaching  Response to learning: Some Evidence of Learning, Needs Reinforcement  Needs reinforcement on: all    Patient Goal(s):  Patient will Verbalize readiness for discharge by discharge date         Patient will  Demonstrate improved self care by discharge date   Other:    Restraints:  No  Restraint Goal: Patient will be free from injury while physically restrained.  See Docflowsheet for restraint documentation, interventions, education, etc.

## 2023-06-16 NOTE — Progress Notes
 General Progress Note      Admission Date: 06/06/2023  LOS: 10 days                     Assessment/Plan:    Principal Problem:    Hypoxia  Active Problems:    Transient alteration of awareness    Javier Gutierrez is an 83 y/o M with PMH of known asthma/COPD, HFrEF, A-fib (on Eliquis), HTN, PAD, h/o CVA with expressive aphasia, CKD3, who presents with progressive shortness of breath and hypoxemia. He was found to have LLL pneumonia that was felt to not be adequately treated previously. Pulmonary was consulted and pt was started on IV Zosyn. Pt's sputum culture had no growth. Due to prior sputum culture growing pseudomonas it was recommended that pt continue pseudomonal coverage, pt noted to have thrombocytopenia that was felt to be related to Zosyn. He was de-escalated to cefepime + metronidazole. Of note, on 5/21, pt developed tremors which progressed to encephalopathy on 5/22. This was felt to be partly related to cefepime so this was discontinued and pt was switched to levofloxacin. CT head wo contrast showed no intracranial bleed. Neurology was consulted for further evaluation. Stat EEG did not show any epileptiform activity. Pt had improvement in mentation and therefore it was felt that this was due to cefepime.     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  - 5/21 AM pt noted to have mild tremors  - 5/21 evening pt lethargic, not following commands, more tremors noted  - 5/22 morning pt aphasic, with some secretions, notable myoclonus in bilateral upper and lower extremities, lethargic  - stat CT head wo contrast (5/22): no evidence of intracranial bleed  - received IV Keppra x 1 (5/22)  - stat EEG (5/22): no epileptiform activity  Plan:   - SLP re-consulted and evaluated at bedside on 5/23, okay to resume dysphagia diet (was on this previously)  - neurology has signed off  - Added cefepime to drug intolerance given high suspicion for being culprit to neurotoxicity    Left lower lobe pneumonia  RLL mucus plugging 2/2 aspiration  + Rhinovirus/enterovirus  Chronic bronchiectasis  Moderate dysphagia  - recent diagnosis of pseudomonal pneumonia in April 2025  - patient recently admitted at [facility] for pneumonia and treated with zosyn followed by a week long course of Levaquin through 5/12, daughter reports breathing again worsened a few days prior to arrival   - CTA chest on admission with LLL consolidation and development of bibasilar nodular opacities consistent with multifocal pneumonia and cental and peripheral bronchiectasis  - reports history of aspiration  - MRSA nares negative, legionella Ag negative, histoplasma Ag negative  - RVP + rhinovirus/enterovirus  - evaluated by SLP on 5/16 with concerns for moderate dysphagia  - sputum culture (5/17): GS with few gram positive cocci in singles and/or pairs, rare budding yeast  - Zosyn (5/15 - 5/19)  - cefepime (5/19 - 5/21)  Plan:   - pulmonary has signed off, plan for outpatient TeleHealth f/u with pulm on 5/29  - Completed IV levofloxacin 750mg  Q48H x 2 doses (5/22 and 5/24)  - Continue PT budesonide, formoterolMucinex BID, DuoNebs Q4H and PRN, sodium chloride nebulizer BID  - appreciate RT help with suctioning   - follow blood cultures (5/15)    Acute moderate thrombocytopenia, possibly related to abx  - plt dropping this admission, down to 79 (5/21)  - felt to be due to Zosyn, discontinued as  above, however has been low since day of admission even prior to initiation of Zosyn (5/15)  - discussed with daughter who reported pt did receive about 4 days of Zosyn at outside hospital  - pt on Eliquis and has not been using heparin products  - INR just slightly elevated at 1.6  - peripheral smear (5/20): no qualitative morphologic abnormalities of diagnostic significant   Plan:   - Zosyn discontinued  - monitor CBC     History of asthma vs COPD with recent exacerbation  - patient recently treated for COPD exacerbation and had been on steroid taper  - continue PTA budesonide and albuterol PRN  - continue PTA montelukast  - completed steroid taper on 5/19  - has outpatient pulmonary establish care appt arranged with plan for outpatient PFTs in August 2025    Hypertension, uncontrolled  Chronic systolic heart failure  PAD s/p stent  CVA with expressive aphasia  Atrial fibrillation  - TTE 3/7: LVEF 45%. Segmental WMA. Mild LV diastolic dysfunction. Trace MR.  - BNP elevated on admission to 2297, patient appears euvolemic on exam and is at his dry weight of 164lbs  - hs troponin mildly elevated in ED 36->40->43, patient denies any chest pain  - EKG in ED with sinus tach and bifascicular block, noted on prior EKGs  Plan:   - as outpatient can discuss with cardiologist regarding increase in dose of apixaban (to 5mg  BID rather than 2.5mg  BID) as currently no clear indication for reduced dose  - cont PTA statin, Plavix  - cont PTA metoprolol succinate 12.5mg  daily > up-titrated to 25mg  daily beginning 5/18  - per pt's daughter pt has had labile blood pressures, especially one time after taking Flomax he had to be taken off of all anti-hypertensive meds and is now just beginning to titrate up  - Hold pta lisinopril 2.5mg  BID based off of outpatient cardiology notes  - takes furosemide 20mg  daily PRN    AKI on CKD3  - Cr at baseline has been varibale, frequently ~1.3 - 1.4 but also has multiple times gets to 1.0 recently even during this hospitalization  - Cr inc from 1.1 to 1.49 over 48h on 5/25. Will volume expand with albumin x 2   - Hold pta lisinopril    Hypokalemia  - monitor lab and replace     Hypothyroidism: PTA levothyroxine     Diet: Dysphagia diet  VTE ppx: Eliquis  CODE: Full Code  DISPO: continue inpatient admission for encephalopathy, pneumonia, thrombocytopenia.  Neurological status continues to improve.  Currently patient requires significant amount of care but family as best as possible would like to manage at home quite some more familiar event department.  However they recognize that he has declined quite a bit in terms of strength.    PT will re-evaluate on 5/25 given his physical decline over a few days. Will volume expand with albumin for mild AKI on 5/25. Anticipate home over the next 1-2 days pending PT evaluation.     High medical decision making due to the following:  1 acute or chronic illness that poses a threat to life or bodily function  Review of notes outside of my specialty, Review of each unique test, and Ordering of each unique test    __________________________________________________________________________________________________________________________________    Subjective    Daughter at the bedside, patient wakes up but will fall back asleep. He shook no any new concerns. Evidently did not eat or drink much yesterday. No nausea,  emesis.     Medications  Scheduled Meds:albumin  5 % injection 250 mL, 250 mL, Intravenous, Q6H*  albuterol-ipratropium (DUONEB) nebulizer solution 3 mL, 3 mL, Inhalation, QID & PRN  apixaban (ELIQUIS) tablet 2.5 mg, 2.5 mg, Oral, BID  atorvastatin (LIPITOR) tablet 20 mg, 20 mg, Oral, QDAY  budesonide (PULMICORT) nebulizer solution 0.5 mg, 0.5 mg, Inhalation, BID  clopiDOGreL (PLAVIX) tablet 75 mg, 75 mg, Oral, QDAY  guaiFENesin LA (MUCINEX) tablet 600 mg, 600 mg, Oral, BID  levothyroxine (SYNTHROID) tablet 88 mcg, 88 mcg, Oral, QDAY 30 min before breakfast  [Held by Provider] lisinopriL (ZESTRIL) tablet 2.5 mg, 2.5 mg, Oral, BID  metoprolol succinate XL (TOPROL XL) tablet 25 mg, 25 mg, Oral, QDAY  montelukast (SINGULAIR) tablet 10 mg, 10 mg, Oral, QHS  pantoprazole DR (PROTONIX) tablet 40 mg, 40 mg, Oral, QDAY  sodium chloride (NEBUSAL) 3 % nebulizer solution 4 mL, 4 mL, Inhalation, BID    Continuous Infusions:      PRN and Respiratory Meds:albuterol 0.083% QID PRN, dextrose 50% (D50) IV PRN, labetalol (NORMODYNE; TRANDATE) injection Q2H PRN, melatonin QHS PRN, nystatin BID PRN, ondansetron Q6H PRN **OR** ondansetron (ZOFRAN) IV Q6H PRN, polyethylene glycol 3350 QDAY PRN, sennosides-docusate sodium QDAY PRN      Objective                       Vital Signs: Last Filed                 Vital Signs: 24 Hour Range   BP: 113/56 (05/25 0912)  Temp: 36.8 ?C (98.3 ?F) (05/25 0745)  Pulse: 91 (05/25 0912)  Respirations: 16 PER MINUTE (05/25 0745)  SpO2: 93 % (05/25 0745)  O2 Device: None (Room air) (05/25 0745) BP: (113-150)/(54-84)   Temp:  [36.3 ?C (97.4 ?F)-37.4 ?C (99.3 ?F)]   Pulse:  [78-102]   Respirations:  [14 PER MINUTE-16 PER MINUTE]   SpO2:  [93 %-99 %]   O2 Device: None (Room air)     Vitals:    06/06/23 1442 06/07/23 1445   Weight: 72.6 kg (160 lb) 70 kg (154 lb 5.2 oz)       Intake/Output Summary:  (Last 24 hours)    Intake/Output Summary (Last 24 hours) at 06/16/2023 0943  Last data filed at 06/15/2023 1142  Gross per 24 hour   Intake 0 ml   Output 0 ml   Net 0 ml           Physical Exam   General: Oriented to self, no acute distress, old, frail.  HEENT: normocephalic. Dried up blood around his nostrils.   Cardiovascular: regular rate and rhythm.  Lungs: respirations non-labored. CTA bilaterally.   Abdomen: non-distended.   Extremities: no deformity.   Neuro: alert, able to follow commands and track, speaks minimally (chronic, has expressive aphasia), moves all extremities on command      Lab Review  CBC w/Diff    Lab Results   Component Value Date/Time    WBC 4.90 06/16/2023 07:20 AM    RBC 4.62 06/16/2023 07:20 AM    HGB 12.0 (L) 06/16/2023 07:20 AM    HCT 37.2 (L) 06/16/2023 07:20 AM    MCV 80.5 06/16/2023 07:20 AM    MCH 26.0 06/16/2023 07:20 AM    MCHC 32.3 06/16/2023 07:20 AM    RDW 18.6 (H) 06/16/2023 07:20 AM    PLTCT 122 (L) 06/16/2023 07:20 AM    MPV 8.3 06/16/2023 07:20 AM  Lab Results   Component Value Date/Time    NEUT 58.0 06/15/2023 06:38 AM    ANC 3.3 06/16/2023 07:20 AM    LYMA 23.3 (L) 06/15/2023 06:38 AM    ALC 1.20 06/15/2023 06:38 AM    MONA 14.2 (H) 06/15/2023 06:38 AM    AMC 0.80 06/15/2023 06:38 AM    EOSA 3.1 06/15/2023 06:38 AM    AEC 0.20 06/15/2023 06:38 AM    BASA 1.4 06/15/2023 06:38 AM    ABC 0.10 06/15/2023 06:38 AM        Comprehensive Metabolic Profile    Lab Results   Component Value Date/Time    NA 134 (L) 06/16/2023 07:20 AM    K 4.2 06/16/2023 07:20 AM    CL 100 06/16/2023 07:20 AM    CO2 28 06/16/2023 07:20 AM    GAP 6 06/16/2023 07:20 AM    BUN 19 06/16/2023 07:20 AM    CR 1.49 (H) 06/16/2023 07:20 AM    GLU 99 06/16/2023 07:20 AM    Lab Results   Component Value Date/Time    CA 8.3 (L) 06/16/2023 07:20 AM    PO4 2.1 03/12/2023 05:49 AM    ALBUMIN 2.7 (L) 06/16/2023 07:20 AM    TOTPROT 5.6 (L) 06/16/2023 07:20 AM    ALKPHOS 38 06/16/2023 07:20 AM    AST 13 06/16/2023 07:20 AM    ALT 11 06/16/2023 07:20 AM    TOTBILI 0.4 06/16/2023 07:20 AM    GFR 46 (L) 06/16/2023 07:20 AM    GFRAA 48 (L) 06/29/2018 06:53 AM          Point of Care Testing  (Last 24 hours)  FSBS (Manual): (!) 114  Glucose: 99  POC Glucose (Download): (!) 114    Radiology and other Diagnostics Review:  Pertinent radiology reviewed     Arna Lanes, DO

## 2023-06-17 ENCOUNTER — Encounter: Admit: 2023-06-17 | Discharge: 2023-06-17 | Payer: MEDICARE

## 2023-06-17 ENCOUNTER — Inpatient Hospital Stay
Admit: 2023-06-06 | Discharge: 2023-06-17 | Disposition: A | Discharge status: Home Health | Payer: MEDICARE | Admitting: Student in an Organized Health Care Education/Training Program

## 2023-06-17 VITALS — BP 138/66 | HR 56 | Temp 97.30000°F

## 2023-06-17 VITALS — BP 132/65 | HR 88 | Temp 97.40000°F

## 2023-06-17 VITALS — BP 127/57 | HR 85 | Temp 97.10000°F

## 2023-06-17 VITALS — BP 139/52 | HR 98 | Temp 97.30000°F

## 2023-06-17 VITALS — BP 132/61 | HR 89 | Temp 99.20000°F

## 2023-06-17 DIAGNOSIS — Z86718 Personal history of other venous thrombosis and embolism: Secondary | ICD-10-CM

## 2023-06-17 DIAGNOSIS — J441 Chronic obstructive pulmonary disease with (acute) exacerbation: Secondary | ICD-10-CM

## 2023-06-17 DIAGNOSIS — Z7901 Long term (current) use of anticoagulants: Secondary | ICD-10-CM

## 2023-06-17 DIAGNOSIS — D696 Thrombocytopenia, unspecified: Secondary | ICD-10-CM

## 2023-06-17 DIAGNOSIS — Z79890 Hormone replacement therapy (postmenopausal): Secondary | ICD-10-CM

## 2023-06-17 DIAGNOSIS — T360X5A Adverse effect of penicillins, initial encounter: Secondary | ICD-10-CM

## 2023-06-17 DIAGNOSIS — R1310 Dysphagia, unspecified: Secondary | ICD-10-CM

## 2023-06-17 DIAGNOSIS — E876 Hypokalemia: Secondary | ICD-10-CM

## 2023-06-17 DIAGNOSIS — Z860100 Personal history of colon polyps, unspecified: Secondary | ICD-10-CM

## 2023-06-17 DIAGNOSIS — Z79899 Other long term (current) drug therapy: Secondary | ICD-10-CM

## 2023-06-17 DIAGNOSIS — I5022 Chronic systolic (congestive) heart failure: Secondary | ICD-10-CM

## 2023-06-17 DIAGNOSIS — Z7951 Long term (current) use of inhaled steroids: Secondary | ICD-10-CM

## 2023-06-17 DIAGNOSIS — R54 Age-related physical debility: Secondary | ICD-10-CM

## 2023-06-17 DIAGNOSIS — Z825 Family history of asthma and other chronic lower respiratory diseases: Secondary | ICD-10-CM

## 2023-06-17 DIAGNOSIS — G253 Myoclonus: Secondary | ICD-10-CM

## 2023-06-17 DIAGNOSIS — E039 Hypothyroidism, unspecified: Secondary | ICD-10-CM

## 2023-06-17 DIAGNOSIS — J4540 Moderate persistent asthma, uncomplicated: Secondary | ICD-10-CM

## 2023-06-17 DIAGNOSIS — I429 Cardiomyopathy, unspecified: Secondary | ICD-10-CM

## 2023-06-17 DIAGNOSIS — I480 Paroxysmal atrial fibrillation: Secondary | ICD-10-CM

## 2023-06-17 DIAGNOSIS — I350 Nonrheumatic aortic (valve) stenosis: Secondary | ICD-10-CM

## 2023-06-17 DIAGNOSIS — G9340 Encephalopathy, unspecified: Secondary | ICD-10-CM

## 2023-06-17 DIAGNOSIS — E785 Hyperlipidemia, unspecified: Secondary | ICD-10-CM

## 2023-06-17 DIAGNOSIS — N183 Chronic kidney disease, stage 3 unspecified (CMS-HCC): Secondary | ICD-10-CM

## 2023-06-17 DIAGNOSIS — J151 Pneumonia due to Pseudomonas: Secondary | ICD-10-CM

## 2023-06-17 DIAGNOSIS — I739 Peripheral vascular disease, unspecified: Secondary | ICD-10-CM

## 2023-06-17 DIAGNOSIS — N179 Acute kidney failure, unspecified: Secondary | ICD-10-CM

## 2023-06-17 DIAGNOSIS — I130 Hypertensive heart and chronic kidney disease with heart failure and stage 1 through stage 4 chronic kidney disease, or unspecified chronic kidney disease: Secondary | ICD-10-CM

## 2023-06-17 DIAGNOSIS — Z952 Presence of prosthetic heart valve: Secondary | ICD-10-CM

## 2023-06-17 DIAGNOSIS — J470 Bronchiectasis with acute lower respiratory infection: Principal | ICD-10-CM

## 2023-06-17 DIAGNOSIS — B9789 Other viral agents as the cause of diseases classified elsewhere: Secondary | ICD-10-CM

## 2023-06-17 DIAGNOSIS — Z7902 Long term (current) use of antithrombotics/antiplatelets: Secondary | ICD-10-CM

## 2023-06-17 DIAGNOSIS — I452 Bifascicular block: Secondary | ICD-10-CM

## 2023-06-17 DIAGNOSIS — I2510 Atherosclerotic heart disease of native coronary artery without angina pectoris: Secondary | ICD-10-CM

## 2023-06-17 DIAGNOSIS — D6959 Other secondary thrombocytopenia: Secondary | ICD-10-CM

## 2023-06-17 DIAGNOSIS — Z87891 Personal history of nicotine dependence: Secondary | ICD-10-CM

## 2023-06-17 DIAGNOSIS — R0902 Hypoxemia: Secondary | ICD-10-CM

## 2023-06-17 DIAGNOSIS — J440 Chronic obstructive pulmonary disease with acute lower respiratory infection: Secondary | ICD-10-CM

## 2023-06-17 DIAGNOSIS — I701 Atherosclerosis of renal artery: Secondary | ICD-10-CM

## 2023-06-17 DIAGNOSIS — Z881 Allergy status to other antibiotic agents status: Secondary | ICD-10-CM

## 2023-06-17 DIAGNOSIS — T361X5A Adverse effect of cephalosporins and other beta-lactam antibiotics, initial encounter: Secondary | ICD-10-CM

## 2023-06-17 DIAGNOSIS — Z888 Allergy status to other drugs, medicaments and biological substances status: Secondary | ICD-10-CM

## 2023-06-17 DIAGNOSIS — I69320 Aphasia following cerebral infarction: Secondary | ICD-10-CM

## 2023-06-17 DIAGNOSIS — Z87442 Personal history of urinary calculi: Secondary | ICD-10-CM

## 2023-06-17 DIAGNOSIS — J690 Pneumonitis due to inhalation of food and vomit: Secondary | ICD-10-CM

## 2023-06-17 DIAGNOSIS — Z86711 Personal history of pulmonary embolism: Secondary | ICD-10-CM

## 2023-06-17 LAB — POC GLUCOSE
~~LOC~~ BKR POC GLUCOSE: 108 mg/dL — ABNORMAL HIGH (ref 70–100)
~~LOC~~ BKR POC GLUCOSE: 89 mg/dL (ref 70–100)

## 2023-06-17 MED ORDER — IPRATROPIUM-ALBUTEROL 0.5 MG-3 MG(2.5 MG BASE)/3 ML IN NEBU
3 mL | Freq: Two times a day (BID) | RESPIRATORY_TRACT | 0 refills | Status: DC | PRN
Start: 2023-06-17 — End: 2023-06-17

## 2023-06-17 NOTE — Case Management (ED)
 Case Management Progress Note    NAME:Javier Gutierrez                          MRN: [id number]              DOB:03/20/1940          AGE: 83 y.o.  ADMISSION DATE: 06/06/2023             DAYS ADMITTED: LOS: 11 days      Today's Date: 06/17/2023    PLAN: DC home today with [facility]     Expected Discharge Date: 06/17/2023   Is Patient Medically Stable: Yes   Are there Barriers to Discharge? no    INTERVENTION/DISPOSITION:  Discharge Planning              Discharge Planning: Home Health    NCM received message from Dr. Earvin Goldberg that pt can DC home today  NCM faxed signed orders and DC AVS to [facility]   [facility] notified of hospital DC     Transportation              Does the Patient Need Case Management to Arrange Discharge Transport? (ex: facility, ambulance, wheelchair/stretcher, Medicaid, cab, other): No  Will the Patient Use Family Transport?: Yes  Transportation Name, Phone and Availability #1: dtr [name] [phone]  Support              Support: Huddle/team update  Info or Referral              Information or Referral to Community Resources: No Needs Identified  Positive SDOH Domains and Potential Barriers                   Medication Needs              Medication Needs: No Needs Identified                                                                                                                                          Financial              Financial: No Needs Identified  Legal              Legal: No Needs Identified  Other              Other/None: No needs identified  Discharge Disposition  Selected Continued Care - Admitted Since 06/06/2023       [facility]       Service Provider Services Address Phone Fax Patient Preferred    [facility] [address], [place] [address] [phone] --                      Leopoldo Rancher, RN, BSN  Integrated Nurse Case Manager  Available via [employer]

## 2023-06-17 NOTE — Discharge Instructions - Pharmacy
 Discharge Summary      Name: Javier Gutierrez  Medical Record Number: [id number]        Account Number:  [id number]  Date Of Birth:  April 06, 1940                         Age:  83 y.o.  Admit date:  06/06/2023                     Discharge date: 06/17/2023      Discharge Attending:  Lyndall Sane MD  Discharge Summary Completed By: Lyndall Sane, MD    Service: Med Private L- [phone]    Reason for hospitalization:  Hypoxia [R09.02]    Primary Discharge Diagnosis:   Hypoxia    Hospital Diagnoses:  Hospital Problems        Active Problems    * (Principal) Hypoxia    Transient alteration of awareness     Present on Admission:   Hypoxia        Significant Past Medical History        Acute on chronic systolic heart failure, NYHA class 2 (CMS-HCC)  Amyloidosis (CMS-HCC)  Aortic valve stenosis, mild  Arrhythmia      Comment:  new onset afib-sched for cardioversion 05/082024  Arterial occlusion      Comment:  left kidney-two stents placed  Arthritis      Comment:  lower back  Asthma  CAD (coronary artery disease)  Cardiomyopathy (CMS-HCC)  Carotid artery plaque  Cataract      Comment:  Surgery 2018  CHF (congestive heart failure) (CMS-HCC)  Chronic kidney disease  Colon polyps  COPD (chronic obstructive pulmonary disease) (CMS-HCC)  Coronary atherosclerosis  Dizziness  Dyspnea  Frailty  H/O: CVA (cardiovascular accident)  History of renal artery stenosis  HTN  Hyperlipidemia  Hyperlipidemia  Hypertension  Hypothyroidism  Kidney disease  Kidney stones  Lung disease  Peripheral vascular disease  Tobacco abuse  Valvular heart disease    Allergies   Tamsulosin and Cefepime    Brief Hospital Course   The patient was admitted and the following issues were addressed during this hospitalization: (with pertinent details including admission exam/imaging/labs).      Javier Gutierrez is an 83 y/o M with PMH of known asthma/COPD, HFrEF, A-fib (on Eliquis), HTN, PAD, h/o CVA with expressive aphasia, CKD3, who presents with progressive shortness of breath and hypoxemia. He was found to have LLL pneumonia that was felt to not be adequately treated previously. Pulmonary was consulted and pt was started on IV Zosyn. Pt's sputum culture had no growth. Due to prior sputum culture growing pseudomonas it was recommended that pt continue pseudomonal coverage, pt noted to have thrombocytopenia that was felt to be related to Zosyn. He was de-escalated to cefepime + metronidazole. Of note, on 5/21, pt developed tremors which progressed to encephalopathy on 5/22. This was felt to be partly related to cefepime so this was discontinued and pt was switched to levofloxacin. CT head wo contrast showed no intracranial bleed. Neurology was consulted for further evaluation. Stat EEG did not show any epileptiform activity. Pt had improvement in mentation after discontinuation of cefepime and therefore it was felt that this was due to cefepime.  Problem-based summary noted below.     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  Neurology signed off.  Added cefepime to allergy list.    Left lower lobe pneumonia  RLL mucus plugging 2/2 aspiration  + Rhinovirus/enterovirus  Chronic bronchiectasis  Moderate dysphagia  - recent diagnosis of pseudomonal pneumonia in April 2025  - patient recently admitted at [facility] for pneumonia and treated with zosyn followed by a week long course of Levaquin through 5/12, daughter reports breathing again worsened a few days prior to arrival   - CTA chest on admission with LLL consolidation and development of bibasilar nodular opacities consistent with multifocal pneumonia and cental and peripheral bronchiectasis  - reports history of aspiration  - MRSA nares negative, legionella Ag negative, histoplasma Ag negative  - RVP + rhinovirus/enterovirus  - evaluated by SLP on 5/16 with concerns for moderate dysphagia  - sputum culture (5/17): GS with few gram positive cocci in singles and/or pairs, rare budding yeast  - Zosyn (5/15 - 5/19)  - cefepime (5/19 - 5/21)  - pulmonary has signed off, plan for outpatient TeleHealth f/u with pulm on 5/29  - Completed IV levofloxacin 750mg  Q48H x 2 doses (5/22 and 5/24)  - Blood cultures (5/15): Negative       Acute moderate thrombocytopenia, possibly related to abx: Improving  - plt dropping this admission, down to 79 (5/21)  - felt to be due to Zosyn, discontinued as above, however has been low since day of admission even prior to initiation of Zosyn (5/15)  - discussed with daughter who reported pt did receive about 4 days of Zosyn at outside hospital  - pt on Eliquis and has not been using heparin products  - INR just slightly elevated at 1.6  - peripheral smear (5/20): no qualitative morphologic abnormalities of diagnostic significant     Day of discharge exam notable for:     General: Alert, awake, frail-appearing  HEENT: normocephalic. Dried up blood around his nostrils.   CVS:: regular rate and rhythm.  Lungs: CTAB  Abdomen: non-distended.   Extremities: no deformity.   Neuro: alert, able to follow commands and track, speaks minimally (chronic, has expressive aphasia), moves all extremities on command       Items Needing Follow Up   Pending items or areas that need to be addressed at follow up:     Follow-up with pulmonary and primary care physician after discharge    Pending Labs and Follow Up Radiology    Pending labs and/or radiology review at this time of discharge are listed below: Please note- any labs with collected status will not have a result; if this area is blank, there are no items for review.         Medications      Medication List      CONTINUE taking these medications     albuterol 0.083% 2.5 mg /3 mL (0.083 %) nebulizer solution; Commonly   known as: PROVENTIL; Dose: 3 mL; Refills: 0   atorvastatin 20 mg tablet; Commonly known as: LIPITOR; Dose: 20 mg; Take   one tablet by mouth daily.; Quantity: 90 tablet; Refills: 3   budesonide 0.5 mg/2 mL nebulizer solution; Commonly known as: PULMICORT;   Dose: 2 mL; Refills:  0   clopiDOGreL 75 mg tablet; Commonly known as: PLAVIX; Dose: 75 mg; Take   one tablet by mouth daily.; Quantity: 30 tablet; Refills: 1   dutasteride 0.5 mg capsule; Commonly known as: AVODART; Dose: 0.5 mg;   Refills: 0   ELIQUIS 2.5 mg tablet; Generic drug: apixaban; Dose: 2.5 mg; TAKE 1   TABLET BY MOUTH TWO TIMES A DAY; Quantity: 180 tablet; Refills: 3   food supplemt, lactose-reduced 0.05 gram- 1.5 kcal/mL oral liquid;   Commonly known as: ENSURE PLUS; Dose: 1 Can; Refills: 0   formoterol fumarate 20 mcg/2 mL nebulizer vial; Commonly known as:   PERFOROMIST; Dose: 20 mcg; Refills: 0   levothyroxine 88 mcg tablet; Commonly known as: SYNTHROID; Dose: 88 mcg;   Refills: 0   metoprolol succinate XL 25 mg extended release tablet; Commonly known   as: TOPROL XL; Dose: 12.5 mg; Take one-half tablet by mouth daily.;   Quantity: 45 tablet; Refills: 3   montelukast 10 mg tablet; Commonly known as: SINGULAIR; Dose: 10 mg;   Take one tablet by mouth at bedtime daily.; Quantity: 30 tablet; Refills:   1   MYRBETRIQ 25 mg ER tablet; Generic drug: mirabegron; Dose: 25 mg;   Refills: 0   other medication; Refills: 0   pantoprazole DR 40 mg tablet; Commonly known as: PROTONIX; Dose: 40 mg;   Take one tablet by mouth daily.; Quantity: 30 tablet; Refills: 1     STOP taking these medications     furosemide 20 mg tablet; Commonly known as: LASIX   predniSONE 10 mg tablet; Commonly known as: DELTASONE       Return Appointments and Scheduled Appointments     Scheduled appointments:      Jun 20, 2023 1:00 PM  Telehealth visit with Marven Slimmer, APRN-NP  Pulmonology: [facility] (Internal Medicine) [address]  [address]  [place]  [place] [address]  [phone]     Aug 29, 2023 9:30 AM  Testing with PF LAB SCHEDULE A  Pulmonary Function Lab: [facility] (--) [address]  [address], [address]  [place]  [place] [address]  [phone]     Aug 29, 2023 11:00 AM  Office visit with Ferd Householder, DO  Pulmonology: [facility] (Internal Medicine) [address]  [address]  [place]  [place] [address]  [phone]     Sep 30, 2023 11:15 AM  (Arrive by 11:00 AM)  US  RENAL BLADDER COMPLETE with [facility]  Imaging, Ultrasound: [facility] [address] [facility]) [address]  [address]  [place] [address]  [phone]     Oct 03, 2023 1:15 PM  Office visit with Ami Balboa, MD  Urology: [facility] (Urology) [address]  [address], [address]  [place]  [place] [address]  [phone]     Oct 15, 2023 10:00 AM  Office visit with Marina  Arvella Bird, MD  Cardiovascular Medicine: [facility] (CVM Exam) [address]  [address]  [place] [address]  [phone]          Contact information for after-discharge care                [facility]       [facility] [place]    Phone: [phone]    Fax: [phone]    Where: [address], [place] [address]  Service: Home Health Services                  Consults, Procedures, Diagnostics, Micro, Pathology   Consults: Cardiology, Neurology, and Pulmonary  Surgical Procedures & Dates:  Noted in hospital course above  Significant Diagnostic Studies, Micro and Procedures: noted in brief hospital course  Significant Pathology: noted in brief hospital course                       Discharge Disposition, Condition   Patient Disposition: Home Health Care Svc [06]  Condition at Discharge: Stable    Code Status   Full Code    Patient Instructions     Activity       Activity as Tolerated   As directed      It is important to keep increasing your activity level after you leave the hospital.  Moving around can help prevent blood clots, lung infection (pneumonia) and other problems.  Gradually increasing the number of times you are up moving around will help you return to your normal activity level more quickly.  Continue to increase the number of times you are up to the chair and walking daily to return to your normal activity level. Begin to work toward your normal activity level at discharge          Diet       Dysphagia Diet   As directed      Food Consistency: Minced and Moist-IDDSI 5    Drink Consistency: Moderately Thick- IDDSI 3    You can find more information about your specific dysphagia diet level at [email or web address]. If you have additional questions about your diet after you go home, you can call a dietitian at [phone].     Fluid Restrictions   As directed      Limit the amount of your daily fluids to (milliliters).  This is equal to about 8 cups a day..    If you have questions about your diet after you go home, you can call a dietitian at [phone].             Discharge education provided to patient., Signs and Symptoms:   Report these signs and symptoms       Report These Signs and Symptoms   As directed      Please contact your doctor if you have any of the following symptoms: temperature higher than 100.4 degrees F, uncontrolled pain, persistent nausea and/or vomiting, difficulty breathing, chest pain, severe abdominal pain, headache, unable to urinate, unable to have bowel movement, or drainage with a foul odor        , Education: , and Others Instructions:   Other Orders       Questions About Your Stay   Complete by: As directed      Discharging attending physician: Lyndall Sane    Order comments: If you have an emergency after discharge, please dial [phone].    You may contact your discharging physician up to 7 days after discharge for questions about your hospitalization, discharge instructions, or medications by calling [phone] during regular business hours (8AM-4PM) and asking to speak to the doctor listed on the discharge information.       If you are not calling during business hours, ask for the on-call doctor.    If you have new  or worsening symptoms, you may be directed to your primary care provider (PCP) for ongoing questions, an Emergency Department, or  an Urgent Care Clinic for a more immediate evaluation.    For all calls or questions more than 7 days after discharge, please contact your primary care provider (PCP).    For medications after discharge: pain (opioid) medicine cannot be refilled or prescribed by calling your discharging physician.  These medications need to be filled by your primary care provider (PCP).  Regular refill requests should be directed to your primary care provider (PCP).            Additional Orders: Case Management, Supplies, Home Health     Home Health/DME                HOME HEALTH/DME  ONCE        Comments: Home Health/Durable Medical Equipment Order Details    Patient Name:  Javier Gutierrez                   Medical Record Number:   [id number]    Patient Diagnosis & ICD 10 Codes: Hypoxia ICD-10-CM: R09.02  Patient  Height 177.8cm (5'10)  Patient Weight: 72.6 kg (160 lb)    Provider Information:  MD Name: Dr Gaynell Keeler  MD Phone Number: [phone]  MD Fax Number: [phone]  MD NPI: [id number]    Agency Instructions:     Patient now stable for discharge. Please resume the following disciplines: Rn, PT. Please add SLP  RN to complete general assessment, including vitals with temperature, monitor and teach patient and/or caregiver medication management and compliance, monitor and teach pain management and pain medication compliance when necessary. Monitor and teach signs and symptoms of infection, monitor and teach disease management, including signs and symptoms of diet, who and when to call, hospital readmission avoidance. Please see attached AVS for additional discharge instruction.  -Physical Therapy  to evaluate and treat along with home safety evaluation.  Teach strategies for energy conservation, mobility training, strengthening, balance activities and address deficits, maximize function and improve safety.  -Speech therapy to evaluate and treat patient.     Clinical findings to support homebound status: Poor tolerance for activity  Clinical findings to support home care services: Muscle weakness affecting functional activities    I certify that this patient is under my care and that I, or a nurse practitioner or physician's assistant working with me, had a face-to-face encounter that meets the physician's face-to-face encounter requirements with this patient on 06-14-23.    This patient is under my care, and I have initiated the establishment of the plan of care.  This patient will be followed by a physician after discharge, who will periodically review the plan of care.   Question Answer Comment   Attending Name/Contact Dr Gaynell Keeler phone [phone]    PCP Name/Contact Dr Susan Ensign Phone: [phone] Fax: [phone]    Home Health to Follow PCP                             Signed:  Lyndall Sane, MD  06/17/2023      cc:  Primary Care Physician:  Susan Ensign   Verified    Referring physicians:  No ref. provider found   Additional provider(s):        Did we miss something? If additional records are needed, please fax a request on office letterhead to [phone]. Please include the patient's name, date of birth, fax number and type of information needed. Additional request can be made by email at  [email or web address]. For general questions of information about electronic records sharing, call [phone].

## 2023-06-17 NOTE — Discharge Instructions - Appointments
 Follow-up with primary care physician after discharge.

## 2023-06-17 NOTE — Care Plan
 Problem: Discharge Planning  Goal: Participation in plan of care  Outcome: Goal Achieved  Goal: Knowledge regarding plan of care  Outcome: Goal Achieved  Goal: Prepared for discharge  Outcome: Goal Achieved     Problem: Infection, Risk of  Goal: Absence of infection  Outcome: Goal Achieved  Goal: Knowledge of Infection Control Procedures  Outcome: Goal Achieved     Problem: Moderate Fall Risk  Goal: Moderate Fall Risk  Outcome: Goal Achieved     Problem: Skin Integrity  Goal: Skin integrity intact  Outcome: Goal Achieved  Goal: Healing of skin (Wound & Incision)  Outcome: Goal Achieved  Goal: Healing of skin (Pressure Injury)  Outcome: Goal Achieved

## 2023-06-20 ENCOUNTER — Encounter: Admit: 2023-06-20 | Discharge: 2023-06-20 | Payer: MEDICARE

## 2023-06-20 ENCOUNTER — Ambulatory Visit: Admit: 2023-06-20 | Discharge: 2023-06-21 | Payer: MEDICARE

## 2023-06-20 DIAGNOSIS — R0902 Hypoxemia: Secondary | ICD-10-CM

## 2023-06-20 DIAGNOSIS — J470 Bronchiectasis with acute lower respiratory infection: Secondary | ICD-10-CM

## 2023-06-20 DIAGNOSIS — J4530 Mild persistent asthma, uncomplicated: Secondary | ICD-10-CM

## 2023-06-20 DIAGNOSIS — J690 Pneumonitis due to inhalation of food and vomit: Secondary | ICD-10-CM

## 2023-06-20 NOTE — Assessment & Plan Note
-   video swallow during recent admission with silent aspiration c/w lower lobe bronchiectasis and recurrent pneumonia  - continue aspiration precautions and good oral hygiene

## 2023-06-20 NOTE — Assessment & Plan Note
-   resolved   - 94-97% on RA today

## 2023-06-20 NOTE — Progress Notes
 Telehealth Hospital Follow-up Visit    Admission Diagnosis: Hypoxia  Admitted: 5/15 - 06/17/23  Primary Pulm Provider: Dr. Deeann Fare    Subjective    Javier Gutierrez, Javier Gutierrez is a 83 y.o. male with HFrEF, A fib (on Eliquis),  hypothyroidism, history of PE, necrotizing hospital acquired pneumonia with empyema 2020, heavy tobacco use (started age 42, quit 2020. Smoked 1 ppd) and history of asthma who was admitted on 06/06/2023 for shortness of breath with persistent positive RVP for Rhino/entero virus, recent PSAE pneumonia and CT chest with increasing LLL consolidation.     Treated with Cefepime and Flagyl, with discontinuation of Cefepime due to concern for drug induced encephalopathy, and Levquin which was completed on 5/24. Plan to continue home regimen of DuoNeb, Pulmicort and Brovana upon discharge.     Presents today with daughter who contributes to HPI. Patient reports feeling fine with minimal cough or sputum. Daughter confirms patient has had decrease in cough with easier sputum production that is clear in color. Appetite is good. No fever, chills, or night sweats. Increase in daily activity (got outside yesterday). Using DuoNeb 3x daily with flutter valve. Using Brovana and Pulmicort twice daily.     Continues thickened liquids for aspiration precautions. Sometimes hears gurgling when he sleeps.             ROS        Current Outpatient Medications:     albuterol 0.083% (PROVENTIL) 2.5 mg /3 mL (0.083 %) nebulizer solution, Inhale 3 mL solution by nebulizer as directed twice daily as needed., Disp: , Rfl:     apixaban (ELIQUIS) 2.5 mg tablet, TAKE 1 TABLET BY MOUTH TWO TIMES A DAY, Disp: 180 tablet, Rfl: 3    atorvastatin (LIPITOR) 20 mg tablet, Take one tablet by mouth daily., Disp: 90 tablet, Rfl: 3    budesonide (PULMICORT) 0.5 mg/2 mL nebulizer solution, Inhale 2 mL solution by nebulizer as directed twice daily., Disp: , Rfl:     clopiDOGrel (PLAVIX) 75 mg tablet, Take one tablet by mouth daily., Disp: 30 tablet, Rfl: 1    dutasteride (AVODART) 0.5 mg PO capsule, Take one capsule by mouth daily., Disp: , Rfl:     food supplemt, lactose-reduced (ENSURE PLUS) 0.05 gram- 1.5 kcal/mL oral liquid, Take 1 Can by mouth twice daily with meals., Disp: , Rfl:     formoterol fumarate (PERFOROMIST) 20 mcg/2 mL nebulizer vial, Inhale 2 mL solution by nebulizer as directed twice daily., Disp: , Rfl:     levothyroxine (SYNTHROID) 88 mcg tablet, Take one tablet by mouth daily 30 minutes before breakfast., Disp: , Rfl:     metoprolol succinate XL (TOPROL XL) 25 mg extended release tablet, Take one-half tablet by mouth daily., Disp: 45 tablet, Rfl: 3    montelukast (SINGULAIR) 10 mg tablet, Take one tablet by mouth at bedtime daily., Disp: 30 tablet, Rfl: 1    MYRBETRIQ 25 mg tablet, Take one tablet by mouth daily., Disp: , Rfl:     other medication, Prevagen Regular Strength - Take 1 capsule by mouth daily, Disp: , Rfl:     pantoprazole DR (PROTONIX) 40 mg tablet, Take one tablet by mouth daily., Disp: 30 tablet, Rfl: 1     There were no vitals filed for this visit.        CTA CHEST (06/06/23)  IMPRESSION       1. No evidence of pulmonary embolism.     2.  Increase in posterior left lower lobe consolidation and development  of   bibasilar fine clustered nodular opacities, most with multifocal   pneumonia, likely on a basis of aspiration.     3.  Unchanged multifocal central and peripheral bronchiectasis, likely   sequela of chronic infection or aspiration.      By my electronic signature, I attest that I have personally reviewed the   images for this examination and formulated the interpretations and   opinions expressed in this report        Finalized by Joy Nipple, M.D. on 06/06/2023 9:03 PM. Dictated by Haydee Lipa, MD on 06/06/2023 8:27 PM.     Swallow Motion Series (06/07/23)  IMPRESSION     1. Silent aspiration with thin liquid and mildly thickened barium.   2. Please see separately dictated report from the Department of Speech Pathology for further description.     By my electronic signature, I attest that I have personally reviewed the   images for this examination and formulated the interpretations and   opinions expressed in this report        Finalized by Heinz Llano, D.O. on 06/07/2023 4:41 PM. Dictated by Molly Angers, D.O. on 06/07/2023 2:58 PM.       Respiratory Culture Many Normal Oropharyngeal Flora   Pseudomonas aeruginosa, mucoid strain Abnormal    Few Pseudomonas aeruginosa, flat strain Abnormal             Gram Stain Greater than 25/LPF Neutrophils   Less than 10/LPF Squamous Epithelial Cells   Moderate Mixed bacteria           Resulting Agency: [facility]     Susceptibility     Pseudomonas aeruginosa, mucoid strain Pseudomonas aeruginosa, flat strain     INTERPRETATION, KIRBY BAUER INTERPRETATION, KIRBY BAUER    $$$$ Aztreonam Susceptible Susceptible     Cefepime Susceptible Susceptible     Ceftazidime Susceptible Susceptible     Levofloxacin Susceptible Susceptible    $$ Meropenem Susceptible Susceptible     Piperacillin/Tazobactam Susceptible Susceptible     Tobramycin Susceptible Susceptible                     Assessment/Plan:  Assessment    Problem   Bronchiectasis (Cms-Hcc)    Known silent aspiration with lower lobe predominant bronchiectasis   HX PSAE (pan sensitive)  Rx: DuoNeb 3x daily with VibraPep     Hypoxia   Aspiration Pneumonia (Cms-Hcc)   Mild Persistent 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       Hypoxia  - resolved   - 94-97% on RA today    Mild persistent asthma without complication  - historical diagnosis with plan to continue ICS/LABA daily   - continue Brovana and Pulmicort twice daily    Aspiration pneumonia (CMS-HCC)  - video swallow during recent admission with silent aspiration c/w lower lobe bronchiectasis and recurrent pneumonia  - continue aspiration precautions and good oral hygiene     Bronchiectasis (CMS-HCC)  - recent PSAE on sputum culture (04/2023) s/p PO Levaquin x 5 days with recurrence of symptoms and increase in LLL consolidation  - s/p Cefepime, Flagyl and Levaquin  - continue DuoNeb 3x daily  - continue flutter valve 3x daily  - see aspiration pneumonia  - continue with planned follow-up with pulmonary clinic for ongoing management (Dr. Deeann Fare 08/2023)            RTC 8 weeks as scheduled  Marven Slimmer, APRN-NP       Future  Appointments   Date Time Provider Department Center   08/29/2023  9:30 AM PF LAB SCHEDULE A PULMFN1 None   08/29/2023 11:00 AM Ferd Householder, DO MPAPULM IM   09/30/2023 11:15 AM [facility] [place] [facility]   10/03/2023  1:15 PM Ami Balboa, MD [facility] Urology   10/15/2023 10:00 AM Hannen, Marina  Neale Bale, MD [facility] CVM Exam

## 2023-06-21 ENCOUNTER — Encounter: Admit: 2023-06-21 | Discharge: 2023-06-21 | Payer: MEDICARE

## 2023-06-21 DIAGNOSIS — J690 Pneumonitis due to inhalation of food and vomit: Secondary | ICD-10-CM

## 2023-06-21 DIAGNOSIS — J470 Bronchiectasis with acute lower respiratory infection: Secondary | ICD-10-CM

## 2023-06-21 NOTE — Telephone Encounter
-----   Message from Ferd Householder, DO sent at 06/20/2023  3:18 PM CDT -----  Thanks for the heads up! I thinks its reasonable to repeat the CT!     @Neola Worrall  - this is the guy I was alluding to. Could you help arrange a CT for this patient I am going to see in August?    Thank you!!!    Arlyce Lambert  ----- Message -----  From: Marven Slimmer, APRN-NP  Sent: 06/20/2023   1:41 PM CDT  To: Ferd Householder, DO    Hey - this is a gentleman that is scheduled to establish with ou in August. Frail elderly gentleman with a delightful daughter. Recent admission for recurrent pneumonia with recent PSAE and increase LLL consolidation and evidence of silent aspiration. Clinically improved post abx and continuing airway clearance. Most recent CT did not recommend follow-up imaging, but just wanted to loop you in incase you would prefer repeat imaging prior to you August appointment.    I will also mention that daughter is incredibly realistic and states she would only want to readmit for reversible causes. (Just FYI)    Thanks  Sherline Distel

## 2023-06-21 NOTE — Telephone Encounter
 Order placed for CT Chest.  [facility] radiology scheduling.  CT Chest scheduled for 10:45 am on 08/29/2023 prior to appointment with Dr. Deeann Fare.  Spoke with pt's daughter, Kathaleen Pale to provide update.  [name] voiced understanding and appreciation of call.

## 2023-07-24 ENCOUNTER — Encounter: Admit: 2023-07-24 | Discharge: 2023-07-24 | Payer: MEDICARE

## 2023-07-24 NOTE — Telephone Encounter
 Pt's daughter called to notify that pt has been reporting increased and more frequent right sided flank pain recently. She is concerned it is related to kidney stone. He has bene taking Tylenol  PRN. Last night and this morning, his pain was exacerbated. Currently, his pain has resolved.     Pt was last seen on 04/25/23 with the following plan:     --Follow-up 3 months with ultrasound and labs prior  --Return precautions given to patient who expressed understanding  --All questions answered  --Complex counseling and decision making     Dr. Erskin was monitoring LEFT ureteral stone. However, CT scan did report bilateral non-obstructing kidney stones. Pt is currently scheduled for appointment in September with RUS prior.     Called radiology scheduling to move RUS up to 7/23 @ 4:00 pm @ [facility]. Contacted pt's home health nurse, Hospital doctor. She will obtain urine culture and CMP tomorrow through Dr. Erby office. Will request results. Encouraged pt to drink plenty of water , take Tylenol  PRN, and monitor symptoms. He is on dutasteride . Instructed daughter to take pt to ER if he develops a fever >100.4, chills, severe pain, persistent N/V, or urinary retention. She v/u; no further questions.

## 2023-07-25 ENCOUNTER — Observation Stay: Admit: 2023-07-25 | Discharge: 2023-07-25 | Payer: MEDICARE

## 2023-07-25 ENCOUNTER — Inpatient Hospital Stay: Admit: 2023-07-25 | Payer: MEDICARE

## 2023-07-25 ENCOUNTER — Emergency Department: Admit: 2023-07-25 | Discharge: 2023-07-25 | Payer: MEDICARE

## 2023-07-25 ENCOUNTER — Encounter: Admit: 2023-07-25 | Discharge: 2023-07-25 | Payer: MEDICARE

## 2023-07-25 DIAGNOSIS — I5022 Chronic systolic (congestive) heart failure: Secondary | ICD-10-CM

## 2023-07-25 DIAGNOSIS — E871 Hypo-osmolality and hyponatremia: Secondary | ICD-10-CM

## 2023-07-25 DIAGNOSIS — N201 Calculus of ureter: Secondary | ICD-10-CM

## 2023-07-25 DIAGNOSIS — R030 Elevated blood-pressure reading, without diagnosis of hypertension: Secondary | ICD-10-CM

## 2023-07-25 DIAGNOSIS — R1312 Dysphagia, oropharyngeal phase: Secondary | ICD-10-CM

## 2023-07-25 DIAGNOSIS — R1310 Dysphagia, unspecified: Secondary | ICD-10-CM

## 2023-07-25 LAB — CBC AND DIFF
~~LOC~~ BKR ABSOLUTE BASO COUNT: 0.1 10*3/uL (ref 0.00–0.20)
~~LOC~~ BKR ABSOLUTE EOS COUNT: 0 10*3/uL — AB (ref 0.00–0.45)
~~LOC~~ BKR ABSOLUTE LYMPH COUNT: 1.1 10*3/uL — AB (ref 1.00–4.80)
~~LOC~~ BKR ABSOLUTE NEUTROPHIL: 12 10*3/uL — ABNORMAL HIGH (ref 1.80–7.00)
~~LOC~~ BKR BASOPHILS %: 0.4 % (ref 0.0–2.0)
~~LOC~~ BKR MDW (MONOCYTE DISTRIBUTION WIDTH): 22 — ABNORMAL HIGH (ref <=20.6)
~~LOC~~ BKR WBC COUNT: 15 10*3/uL — ABNORMAL HIGH (ref 4.50–11.00)

## 2023-07-25 LAB — COMPREHENSIVE METABOLIC PANEL
~~LOC~~ BKR ALK PHOSPHATASE: 55 U/L — AB (ref 25–110)
~~LOC~~ BKR ALT: 7 U/L (ref 7–56)
~~LOC~~ BKR AST: 12 U/L — ABNORMAL HIGH (ref 7–40)
~~LOC~~ BKR BLD UREA NITROGEN: 14 mg/dL (ref 7–25)
~~LOC~~ BKR CALCIUM: 8.7 mg/dL — ABNORMAL HIGH (ref 8.5–10.6)
~~LOC~~ BKR CHLORIDE: 98 mmol/L — ABNORMAL HIGH (ref 98–110)
~~LOC~~ BKR CREATININE: 1.1 mg/dL (ref 0.40–1.24)
~~LOC~~ BKR GLUCOSE, RANDOM: 103 mg/dL — ABNORMAL HIGH (ref 70–100)
~~LOC~~ BKR POTASSIUM: 4 mmol/L — ABNORMAL HIGH (ref 3.5–5.1)
~~LOC~~ BKR SODIUM, SERUM: 134 mmol/L — ABNORMAL LOW (ref 137–147)
~~LOC~~ BKR TOTAL PROTEIN: 7.2 g/dL — AB (ref 6.0–8.0)

## 2023-07-25 LAB — URINALYSIS DIPSTICK REFLEX TO CULTURE
~~LOC~~ BKR NITRITE: NEGATIVE 10*3/uL — ABNORMAL HIGH (ref 0.00–0.80)
~~LOC~~ BKR URINE PH: 6 /HPF — AB (ref 5.0–8.0)
~~LOC~~ BKR URINE SPEC GRAVITY: 1 /HPF — AB (ref 1.005–1.030)

## 2023-07-25 MED ORDER — METOPROLOL SUCCINATE 25 MG PO TB24
12.5 mg | Freq: Every day | ORAL | 0 refills | Status: DC
Start: 2023-07-25 — End: 2023-07-28
  Administered 2023-07-27 – 2023-07-28 (×2): 12.5 mg via ORAL

## 2023-07-25 MED ORDER — DOXYCYCLINE 100 MG/100 ML IVPB (MB+)
100 mg | Freq: Once | INTRAVENOUS | 0 refills | Status: CP
Start: 2023-07-25 — End: ?
  Administered 2023-07-26 (×2): 100 mg via INTRAVENOUS

## 2023-07-25 MED ORDER — PIPERACILLIN/TAZOBACTAM 4.5 G/100ML NS IVPB (MB+)
4.5 g | INTRAVENOUS | 0 refills | Status: DC
Start: 2023-07-25 — End: 2023-07-27
  Administered 2023-07-26 – 2023-07-27 (×14): 4.5 g via INTRAVENOUS

## 2023-07-25 MED ORDER — DUTASTERIDE 0.5 MG PO CAP
.5 mg | Freq: Every day | ORAL | 0 refills | Status: DC
Start: 2023-07-25 — End: 2023-08-01
  Administered 2023-07-27 – 2023-08-01 (×6): 0.5 mg via ORAL

## 2023-07-25 MED ORDER — ARFORMOTEROL 15 MCG/2 ML IN NEBU
15 ug | Freq: Two times a day (BID) | RESPIRATORY_TRACT | 0 refills | Status: DC
Start: 2023-07-25 — End: 2023-08-01
  Administered 2023-07-26 – 2023-08-01 (×13): 15 ug via RESPIRATORY_TRACT

## 2023-07-25 MED ORDER — ALBUTEROL SULFATE 90 MCG/ACTUATION IN HFAA
2 | Freq: Three times a day (TID) | RESPIRATORY_TRACT | 0 refills | Status: DC | PRN
Start: 2023-07-25 — End: 2023-07-26

## 2023-07-25 MED ORDER — LEVOTHYROXINE 88 MCG PO TAB
88 ug | Freq: Every day | ORAL | 0 refills | Status: DC
Start: 2023-07-25 — End: 2023-08-01
  Administered 2023-07-27 – 2023-08-01 (×6): 88 ug via ORAL

## 2023-07-25 MED ORDER — APIXABAN 5 MG PO TAB
2.5 mg | Freq: Two times a day (BID) | ORAL | 0 refills | Status: DC
Start: 2023-07-25 — End: 2023-08-01
  Administered 2023-07-28 – 2023-08-01 (×10): 2.5 mg via ORAL

## 2023-07-25 MED ORDER — CLOPIDOGREL 75 MG PO TAB
75 mg | Freq: Every day | ORAL | 0 refills | Status: DC
Start: 2023-07-25 — End: 2023-08-01
  Administered 2023-07-28 – 2023-08-01 (×5): 75 mg via ORAL

## 2023-07-25 MED ORDER — DOXYCYCLINE 100 MG/100 ML IVPB (MB+)
100 mg | Freq: Two times a day (BID) | INTRAVENOUS | 0 refills | Status: DC
Start: 2023-07-25 — End: 2023-07-28
  Administered 2023-07-26 – 2023-07-28 (×10): 100 mg via INTRAVENOUS

## 2023-07-25 MED ORDER — MONTELUKAST 10 MG PO TAB
10 mg | Freq: Every evening | ORAL | 0 refills | Status: DC
Start: 2023-07-25 — End: 2023-08-01
  Administered 2023-07-27 – 2023-08-01 (×6): 10 mg via ORAL

## 2023-07-25 MED ORDER — FUROSEMIDE 10 MG/ML IJ SOLN
40 mg | Freq: Once | INTRAVENOUS | 0 refills | Status: CP
Start: 2023-07-25 — End: ?
  Administered 2023-07-26: 03:00:00 40 mg via INTRAVENOUS

## 2023-07-25 MED ORDER — ATORVASTATIN 10 MG PO TAB
20 mg | Freq: Every day | ORAL | 0 refills | Status: DC
Start: 2023-07-25 — End: 2023-08-01
  Administered 2023-07-27 – 2023-08-01 (×6): 20 mg via ORAL

## 2023-07-25 MED ORDER — ALBUTEROL SULFATE 90 MCG/ACTUATION IN HFAA
2 | RESPIRATORY_TRACT | 0 refills | Status: DC | PRN
Start: 2023-07-25 — End: 2023-08-01

## 2023-07-25 MED ORDER — MIRABEGRON 25 MG PO TB24
25 mg | Freq: Every day | ORAL | 0 refills | Status: DC
Start: 2023-07-25 — End: 2023-08-01
  Administered 2023-07-27 – 2023-08-01 (×6): 25 mg via ORAL

## 2023-07-25 MED ORDER — POLYETHYLENE GLYCOL 3350 17 GRAM PO PWPK
1 | Freq: Every day | ORAL | 0 refills | Status: DC | PRN
Start: 2023-07-25 — End: 2023-08-01

## 2023-07-25 MED ORDER — MELATONIN 5 MG PO TAB
5 mg | Freq: Every evening | ORAL | 0 refills | Status: DC | PRN
Start: 2023-07-25 — End: 2023-08-01

## 2023-07-25 MED ORDER — PIPERACILLIN/TAZOBACTAM 4.5 G/100ML NS IVPB (MB+)
4.5 g | Freq: Once | INTRAVENOUS | 0 refills | Status: CP
Start: 2023-07-25 — End: ?
  Administered 2023-07-25 (×2): 4.5 g via INTRAVENOUS

## 2023-07-25 MED ORDER — IOHEXOL 350 MG IODINE/ML IV SOLN
100 mL | Freq: Once | INTRAVENOUS | 0 refills | Status: CP
Start: 2023-07-25 — End: ?
  Administered 2023-07-25: 22:00:00 100 mL via INTRAVENOUS

## 2023-07-25 MED ORDER — IPRATROPIUM-ALBUTEROL 0.5 MG-3 MG(2.5 MG BASE)/3 ML IN NEBU
3 mL | Freq: Three times a day (TID) | RESPIRATORY_TRACT | 0 refills | Status: DC | PRN
Start: 2023-07-25 — End: 2023-08-01
  Administered 2023-07-26 – 2023-08-01 (×18): 3 mL via RESPIRATORY_TRACT

## 2023-07-25 MED ORDER — SENNOSIDES-DOCUSATE SODIUM 8.6-50 MG PO TAB
1 | Freq: Every day | ORAL | 0 refills | Status: DC | PRN
Start: 2023-07-25 — End: 2023-08-01
  Administered 2023-07-28: 14:00:00 1 via ORAL

## 2023-07-25 MED ORDER — PANTOPRAZOLE 40 MG PO TBEC
40 mg | Freq: Every day | ORAL | 0 refills | Status: DC
Start: 2023-07-25 — End: 2023-08-01
  Administered 2023-07-27 – 2023-08-01 (×6): 40 mg via ORAL

## 2023-07-25 MED ORDER — BUDESONIDE 0.5 MG/2 ML IN NBSP
.5 mg | Freq: Two times a day (BID) | RESPIRATORY_TRACT | 0 refills | Status: DC
Start: 2023-07-25 — End: 2023-08-01
  Administered 2023-07-26 – 2023-08-01 (×13): 0.5 mg via RESPIRATORY_TRACT

## 2023-07-25 MED ORDER — DOXYCYCLINE HYCLATE 100 MG PO TAB
100 mg | Freq: Two times a day (BID) | ORAL | 0 refills | Status: DC
Start: 2023-07-25 — End: 2023-07-26

## 2023-07-25 MED ORDER — SODIUM CHLORIDE 0.9 % IJ SOLN
50 mL | Freq: Once | INTRAVENOUS | 0 refills | Status: CP
Start: 2023-07-25 — End: ?
  Administered 2023-07-25: 22:00:00 50 mL via INTRAVENOUS

## 2023-07-25 NOTE — Progress Notes
 RT Adult Assessment Note    NAME:Javier Gutierrez             MRN: [id number]             DOB:1940/08/02          AGE: 83 y.o.  ADMISSION DATE: 07/25/2023             DAYS ADMITTED: LOS: 0 days    Additional Comments:  Impressions of the patient: Patient in no respiratory distress at this time.    Intervention(s)/outcome(s): none  Patient education that was completed: none  Recommendations to the care team: encourage IS    Vital Signs:  Pulse: 98  RR: 20 PER MINUTE  SpO2: 92 %  O2 Device: Nasal cannula  Liter Flow: 2 Lpm  O2%:      All Breath Sounds: Decreased  Respiratory Effort/Pattern: Unlabored  Comments:

## 2023-07-25 NOTE — H&P (View-Only)
 Name:  Javier Gutierrez                                             MRN:  [id number]   Admission Date:  07/25/2023                     Assessment/Plan:      Right Hydronephrosis  - pt with right flank pain x 2 days  - wbc 15 k, scr 1.15, ua w/ packed rbc, 2-10 wbc  - CT A/P:   3 mm right mid ureteral calculus, with minimal upstream hydroureteronephrosis. 2.  Additional nonobstructing bilateral renal calculi. No left hydronephrosis. 3.  Mild bladder wall thickening, which may be secondary to chronic outlet obstruction; though cystitis could appear similar. Correlation with urinalysis is recommended. 4.  Trace pleural effusions. Adjacent patchy opacities are likely atelectasis, though infection and/or aspiration could contribute. 5.  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 6.  Cholelithiasis  - urology consulted and plan to take pt to OR for cystoscopy with RIGHT ureteral stent placement    SOB  Concern for Recurrent Pneumonia vs Other  - recent pneumonia + Rhinovirus/enterovirus  - he has known aspiration risk  - check procalcitonin  - continue doxy, and zosyn   - while on zosyn  - monitor plt level  - on his recent admission - there was a concern for cefepime -related neurotoxicity (myoclonus)  - monitor on tele    Moderate Dysphagia  - with known aspiration risk  - SLP ordered    Mild Cognitive Decline  H/o CVA with Expressive Aphasia  - pt is A&O x 2-3 on admission, has some difficulty with reporting the full date but he is A&O and appropriate answering questions  - ctm    Full Code    Discussed with ED attending and/or resident     High medical decision making due to the following:  Decision regarding hospitalization  Review of notes outside of my specialty, Review of each unique test and Ordering of each unique test  ______________________________________________________________________________    Primary Care Physician: Kyung Goodell     Chief Complaint: right flank pain, some SOB    History of Present Illness: Javier Gutierrez is a 83 y.o. male with PMH as bellow who comes to ED for right flank pain, cough and some SOB. Patient was recently admitted from 5/15-5/26 for hypoxia and was found to have LLL pneumonia (please see the DC summary for details). He also was admitted in April for pseudomonal pneumonia. Daughter at bedside helps w/ HPI and she reports a slow recovery after these pneumonias and pt has been having some cough and SOB since them. She reports that initially patient was doing relatively well after his latest hospital discharge but over the last couple of days he seemed to be tender into his right flank so she decided to seek medical attention. On admission pt is A&O x 2-3, NAD, denies any pain, new focal weakness, or active bleeding.    Past Medical History:    Acute on chronic systolic heart failure, NYHA class 2 (CMS-HCC)    Amyloidosis (CMS-HCC)    Aortic valve stenosis, mild    Arrhythmia    Arterial occlusion    Arthritis    Asthma    CAD (coronary artery disease)    Cardiomyopathy (CMS-HCC)  Carotid artery plaque    Cataract    CHF (congestive heart failure) (CMS-HCC)    Chronic kidney disease    Colon polyps    COPD (chronic obstructive pulmonary disease) (CMS-HCC)    Coronary atherosclerosis    Dizziness    Dyspnea    Frailty    H/O: CVA (cardiovascular accident)    History of renal artery stenosis    HTN    Hyperlipidemia    Hyperlipidemia    Hypertension    Hypothyroidism    Kidney disease    Kidney stones    Lung disease    Peripheral vascular disease    Tobacco abuse    Valvular heart disease     Surgical History:   Procedure Laterality Date    HX LUMBAR DISKECTOMY  01/22/1966    30 years ago    STENT INTRAVASCULAR  01/23/2004    kidney stent placed    Left Heart Catheterization With Ventriculogram Left 03/11/2015    Performed by Lannis Maude SAILOR, MD, [facility] at [facility] CATH LAB    Coronary Angiography N/A 03/11/2015    Performed by Lannis Maude SAILOR, MD, [facility] at [facility] CATH LAB    Possible Percutaneous Coronary Intervention N/A 03/11/2015    Performed by Lannis Maude SAILOR, MD, [facility] at [facility] CATH LAB    ESOPHAGOGASTRODUODENOSCOPY N/A 04/26/2015    Performed by Leonce Emerick PARAS, DO at [facility] ENDO    ESOPHAGOGASTRODUODENOSCOPY BIOPSY  04/26/2015    Performed by Leonce Emerick PARAS, DO at [facility] ENDO    REPLACEMENT TRANSCATHETER AORTIC VALVE (Sapien 26s3), right common femoral artery approach N/A 08/03/2015    Performed by Tony Cordella FALCON, MD at [facility] CVOR    BRONCHOSCOPY Bilateral 11/30/2016    Performed by Shereen Rigg, MD at [facility] OR    BRONCHOSCOPY WITH BRONCHIAL ALVEOLAR LAVAGE - FLEXIBLE Right 11/30/2016    Performed by Shereen Rigg, MD at [facility] OR    ESOPHAGOGASTRODUODENOSCOPY for PEG placement N/A 12/06/2016    Performed by Buckles, Toribio BROCKS, MD at [facility] ENDO    ENDARTERECTOMY CAROTID ARTERY Left 02/17/2018    Performed by Idelia Alverna BROCKS, MD at [facility] OR    ANOMALOUS VENOUS RETURN REPAIR  2017    ECHOCARDIOGRAM PROCEDURE  05/2022    HX CATARACT REMOVAL  2018    HX HEART CATHETERIZATION  2015    KIDNEY STONE SURGERY  2025     Family History   Problem Relation Name Age of Onset    Stroke Mother Virginia  Mcglade     Other Mother Virginia  Curtner         colon cancer    Other Father          valve disease/COPD    Aortic Disease/Dissection Father      Cancer Mother Virginia  Hett         Colon    Heart Disease Father Weiland Tomich      Social History     Socioeconomic History    Marital status: Widowed   Tobacco Use    Smoking status: Former     Current packs/day: 0.00     Average packs/day: 1.5 packs/day for 60.0 years (90.0 ttl pk-yrs)     Types: Cigarettes     Start date: 02/13/1958     Quit date: 02/13/2018     Years since quitting: 5.4    Smokeless tobacco: Never    Tobacco comments:     0.5-2 ppd history   Vaping Use  Vaping status: Never Used   Substance and Sexual Activity    Alcohol use: Not Currently    Drug use: Never    Sexual activity: Not Currently     Partners: Female      Immunizations (includes history and patient reported):   Immunization History   Administered Date(s) Administered    COVID-19 (MODERNA), mRNA vacc, 100 mcg/0.5 mL (PF) 03/19/2019    COVID-19 Bivalent (66YR+)(PFIZER), mRNA vacc, 62mcg/0.3mL 10/31/2020    Covid-19 mRNA Vaccine >=12yo (Moderna)(Spikevax) 11/05/2021    Flu Vaccine =>3 YO (Historical) 10/21/2016    Flu Vaccine =>65 YO High-Dose (PF) 12/12/2016, 11/07/2018    Pneumococcal Vaccine (23-Val Adult) 12/12/2016    Tdap Vaccine 06/29/2018           Allergies:  Tamsulosin and Cefepime     Medications:  (Not in a hospital admission)    Review of Systems:  A comprehensive  12 point review of organ systems reviewed and was negative except for the ones mentioned in HOPI    Physical Exam:  Vital Signs: Last Filed In 24 Hours Vital Signs: 24 Hour Range   BP: 179/98 (07/03 1700)  Temp: 36.8 ?C (98.2 ?F) (07/03 1441)  Pulse: 99 (07/03 1700)  Respirations: 18 PER MINUTE (07/03 1700)  SpO2: 96 % (07/03 1630) BP: (134-179)/(71-98)   Temp:  [36.8 ?C (98.2 ?F)]   Pulse:  [96-103]   Respirations:  [18 PER MINUTE-23 PER MINUTE]   SpO2:  [96 %]           General:  Alert, awake, oriented x 2-3 , cooperative  Head:  Normocephalic, without obvious abnormality, atraumatic  Eyes:  Conjunctivae/corneas clear   Nose: Nares normal. Mucosa normal.  No drainage or sinus tenderness  Throat: Lips, mucosa and tongue normal  Neck:    Supple, symmetrical, trachea midline  Lungs:  rales bilaterally  Heart:   Regular rate and rhythm, S1, S2 normal, S murmur  Abdomen:  Soft, non-tender.  Bowel sounds normal.  Extremities: Extremities atraumatic, no cyanosis, mild legs edema  Skin: Skin color, texture, turgor normal.    Neurologic: Non focal grossly    Lab/Radiology/Other Diagnostic Tests:  24-hour labs:    Results for orders placed or performed during the hospital encounter of 07/25/23 (from the past 24 hours)   CBC AND DIFF    Collection Time: 07/25/23  3:51 PM   Result Value Ref Range    White Blood Cells 15.90 (H) 4.50 - 11.00 10*3/uL    Red Blood Cells 4.54 4.40 - 5.50 10*6/uL    Hemoglobin 12.4 (L) 13.5 - 16.5 g/dL    Hematocrit 62.8 (L) 40.0 - 50.0 %    MCV 81.6 80.0 - 100.0 fL    MCH 27.3 26.0 - 34.0 pg    MCHC 33.4 32.0 - 36.0 g/dL    RDW 81.4 (H) 88.9 - 15.0 %    Platelet Count 250 150 - 400 10*3/uL    MPV 9.0 7.0 - 11.0 fL    Neutrophils 79.7 (H) 41.0 - 77.0 %    Lymphocytes 6.7 (L) 24.0 - 44.0 %    Monocytes 13.1 (H) 4.0 - 12.0 %    Eosinophils 0.1 0.0 - 5.0 %    Basophils 0.4 0.0 - 2.0 %    Absolute Neutrophil Count 12.60 (H) 1.80 - 7.00 10*3/uL    Absolute Lymph Count 1.10 1.00 - 4.80 10*3/uL    Absolute Monocyte Count 2.10 (H) 0.00 - 0.80 10*3/uL    Absolute Eosinophil  Count 0.00 0.00 - 0.45 10*3/uL    Absolute Basophil Count 0.10 0.00 - 0.20 10*3/uL    MDW (Monocyte Distribution Width) 22.6 (H) <=20.6   COMPREHENSIVE METABOLIC PANEL    Collection Time: 07/25/23  3:51 PM   Result Value Ref Range    Sodium 134 (L) 137 - 147 mmol/L    Potassium 4.0 3.5 - 5.1 mmol/L    Chloride 98 98 - 110 mmol/L    Glucose 103 (H) 70 - 100 mg/dL    Blood Urea Nitrogen 14 7 - 25 mg/dL    Creatinine 8.84 9.59 - 1.24 mg/dL    Calcium 8.7 8.5 - 89.3 mg/dL    Total Protein 7.2 6.0 - 8.0 g/dL    Total Bilirubin 0.8 0.2 - 1.3 mg/dL    Albumin  3.4 (L) 3.5 - 5.0 g/dL    Alk Phosphatase 55 25 - 110 U/L    AST 12 7 - 40 U/L    ALT 7 7 - 56 U/L    CO2 26 21 - 30 mmol/L    Anion Gap 10 3 - 12    Glomerular Filtration Rate (GFR) >60 >60 mL/min   LIPASE    Collection Time: 07/25/23  3:51 PM   Result Value Ref Range    Lipase 16 11 - 82 U/L   URINALYSIS DIPSTICK REFLEX TO CULTURE    Collection Time: 07/25/23  5:08 PM    Specimen: Midstream; Urine   Result Value Ref Range    Color,UA Yellow     Turbidity,UA 1+ (A) Clear    Specific Gravity-Urine 1.017 1.005 - 1.030    pH,UA 6.0 5.0 - 8.0    Protein,UA 2+ (A) Negative    Glucose,UA Negative Negative    Ketones,UA Negative Negative    Bilirubin,UA Negative Negative    Blood,UA 3+ (A) Negative    Urobilinogen,UA Increased (A) Normal    Nitrite,UA Negative Negative    Leukocytes,UA Trace (A) Negative   URINALYSIS MICROSCOPIC REFLEX TO CULTURE    Collection Time: 07/25/23  5:08 PM    Specimen: Midstream; Urine   Result Value Ref Range    WBCs,UA 2 - 10 (A) None, 0 - 2  /HPF    RBCs,UA Packed (A) None, 0 - 2  /HPF    Mucous,UA Trace None, Trace /LPF   PROTIME INR (PT)    Collection Time: 07/25/23  5:31 PM   Result Value Ref Range    Protime 28.0 (H) 9.9 - 14.2 Seconds    INR 2.5 (H) 0.9 - 1.2   PTT (APTT)    Collection Time: 07/25/23  5:31 PM   Result Value Ref Range    APTT 30.3 24.0 - 36.5 Seconds     Glucose: (!) 103 (07/25/23 1551)  Pertinent radiology reviewed.    CT ABD/PELV W CONTRAST  Result Date: 07/25/2023  1.  3 mm right mid ureteral calculus, with minimal upstream hydroureteronephrosis. 2.  Additional nonobstructing bilateral renal calculi. No left hydronephrosis. 3.  Mild bladder wall thickening, which may be secondary to chronic outlet obstruction; though cystitis could appear similar. Correlation with urinalysis is recommended. 4.  Trace pleural effusions. Adjacent patchy opacities are likely atelectasis, though infection and/or aspiration could contribute. 5.  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 6.  Cholelithiasis. By my electronic signature, I attest that I have personally reviewed the images for this examination and formulated the interpretations and opinions expressed in this report  Finalized by Fairy DOROTHA Hockey, MD  on 07/25/2023 5:41 PM. Dictated by Dorthea Court, MD on 07/25/2023 5:16 PM.    CHEST SINGLE VIEW  Result Date: 07/25/2023  Bilateral lower lung mixed pulmonary opacities, similar on the right and increased on the left. These are nonspecific though favored for pneumonia/aspiration. A component of atelectasis or scarring may also be present. Probable small left and possible trace right pleural effusions.  Finalized by Norleen Pane, M.D. on 07/25/2023 5:31 PM. Dictated by Norleen Pane, M.D. on 07/25/2023 5:28 PM.

## 2023-07-25 NOTE — ED Notes
 ED Initial Provider Note:    This patient was seen in the ED triage area to initiate and expedite the patients ED care when possible.    ED Chief Complaint:   Chief Complaint   Patient presents with    Abdominal Pain     Pt brought in by daughter, home health nurse came over this AM. Pt endorsing R sided abd pain x2 days with distended abdomen, swollen testicles, and lethargy. Pt was given tylenol  which offered relief. Daughter did state pt has PMH of kidney stones. Reporting decreased urinary output. Pt does have hx of dementia    Abdominal pain       S: Javier Gutierrez is a 83 y.o. male who presents to the Emergency Department for patient does have some confusion at baseline, family member with patient provides history.  Family member reports patient complained of some severe right flank/abdominal pain over the last 2 days that became severe this afternoon, home health nurse came to the house today, there was also some concern for scrotal swelling and pain.  Patient is currently without complaints of pain, no abdominal tenderness, no complaints of scrotal or testicular tenderness.  Does have a history of renal calculus    PMHx:  Past Medical History:    Acute on chronic systolic heart failure, NYHA class 2 (CMS-HCC)    Amyloidosis (CMS-HCC)    Aortic valve stenosis, mild    Arrhythmia    Arterial occlusion    Arthritis    Asthma    CAD (coronary artery disease)    Cardiomyopathy (CMS-HCC)    Carotid artery plaque    Cataract    CHF (congestive heart failure) (CMS-HCC)    Chronic kidney disease    Colon polyps    COPD (chronic obstructive pulmonary disease) (CMS-HCC)    Coronary atherosclerosis    Dizziness    Dyspnea    Frailty    H/O: CVA (cardiovascular accident)    History of renal artery stenosis    HTN    Hyperlipidemia    Hyperlipidemia    Hypertension    Hypothyroidism    Kidney disease    Kidney stones    Lung disease    Peripheral vascular disease    Tobacco abuse    Valvular heart disease       BP 134/71 - Pulse 103  - Temp 36.8 ?C (98.2 ?F)  - Wt 69.9 kg (154 lb)  - SpO2 96%  - BMI 22.10 kg/m?    O: Brief Physical: Patient is alert, oriented to person and place, confused about the reason he was brought to the ED.  Respirations are regular nonlabored.  He has no abdominal tenderness on exam,  No scrotal swelling, no testicular tenderness.    A/P: The patient was seen by me as an initial provider in triage. A brief history and physical was obtained. My exam is intended to be an initial medial screening exam. Initial orders have been placed by me. My working diagnosis is renal calculus, altered mental status, UTI.    The patient is deemed appropriate for the main ED. The patient's care will be resumed by the ED provider care team once the patient is roomed in the ED. A more detailed / complete H&P will be documented by those providers.

## 2023-07-25 NOTE — ED Provider Notes
 Javier Gutierrez is a 83 y.o. male.    Chief Complaint:  Chief Complaint   Patient presents with    Abdominal Pain     Pt brought in by daughter, home health nurse came over this AM. Pt endorsing R sided abd pain x2 days with distended abdomen, swollen testicles, and lethargy. Pt was given tylenol  which offered relief. Daughter did state pt has PMH of kidney stones. Reporting decreased urinary output. Pt does have hx of dementia    Abdominal pain       History of Present Illness:  Javier Gutierrez is a 83 y.o. male, with a past medical history of Amyloidosis, Aortic valve stenosis, Arrhythmia, Arterial occlusion, Asthma, CAD, Cardiomyopathy, CHF, CKD, COPD, CVA, renal artery stenosis, HLD, HTN, Hypothyroidism, Kidney disease, Kidney stones (2025), Lung disease, Peripheral vascular disease, and Tobacco abuse who presents to the emergency department for abdominal pain. Daughter at bedside and assisting with history. Daughter notes pt has had intermittent right-sided flank pain and swelling, since yesterday. Daughter reports Tylenol  was given at the time and swelling has decreased since. Daughter states pt has known kidney stones and follows with urology. Daughter additionally states pt started Levaquin  yesterday and x2 doses have been taken so far. Pt denies nausea, vomiting, and SOB currently, despite mild SOB reported by daughter.  Patient states he is feeling asymptomatic.  Denies recent falls. Daughter notes pt utilizes a walker to ambulate. Otherwise, daughter states pt had pneumonia in may. Daughter additionally mentions hx of cefepime -related neurotoxicity, and while she reports improvement in symptoms, she notes pt has not completely returned to baseline but is gradually improving from the neurotoxicity standpoint.      History provided by:  Patient, relative and medical records  Language interpreter used: No        Review of Systems:  Review of Systems   Constitutional:  Negative for fever.   Respiratory: Positive for shortness of breath.    Gastrointestinal:  Positive for abdominal pain. Negative for nausea and vomiting.   Genitourinary:  Positive for flank pain.       Allergies:  Tamsulosin and Cefepime     Past Medical History:  Past Medical History:    Acute on chronic systolic heart failure, NYHA class 2 (CMS-HCC)    Amyloidosis (CMS-HCC)    Aortic valve stenosis, mild    Arrhythmia    Arterial occlusion    Arthritis    Asthma    CAD (coronary artery disease)    Cardiomyopathy (CMS-HCC)    Carotid artery plaque    Cataract    CHF (congestive heart failure) (CMS-HCC)    Chronic kidney disease    Colon polyps    COPD (chronic obstructive pulmonary disease) (CMS-HCC)    Coronary atherosclerosis    Dizziness    Dyspnea    Frailty    H/O: CVA (cardiovascular accident)    History of renal artery stenosis    HTN    Hyperlipidemia    Hyperlipidemia    Hypertension    Hypothyroidism    Kidney disease    Kidney stones    Lung disease    Peripheral vascular disease    Tobacco abuse    Valvular heart disease       Past Surgical History:  Surgical History:   Procedure Laterality Date    HX LUMBAR DISKECTOMY  01/22/1966    30 years ago    STENT INTRAVASCULAR  01/23/2004    kidney stent placed    Left  Heart Catheterization With Ventriculogram Left 03/11/2015    Performed by Lannis Maude SAILOR, MD, [facility] at [facility] CATH LAB    Coronary Angiography N/A 03/11/2015    Performed by Lannis Maude SAILOR, MD, [facility] at [facility] CATH LAB    Possible Percutaneous Coronary Intervention N/A 03/11/2015    Performed by Lannis Maude SAILOR, MD, [facility] at [facility] CATH LAB    ESOPHAGOGASTRODUODENOSCOPY N/A 04/26/2015    Performed by Leonce Emerick PARAS, DO at [facility] ENDO    ESOPHAGOGASTRODUODENOSCOPY BIOPSY  04/26/2015    Performed by Leonce Emerick PARAS, DO at [facility] ENDO    REPLACEMENT TRANSCATHETER AORTIC VALVE (Sapien 26s3), right common femoral artery approach N/A 08/03/2015    Performed by Tony Cordella FALCON, MD at [facility] CVOR    BRONCHOSCOPY Bilateral 11/30/2016    Performed by Shereen Rigg, MD at [facility] OR    BRONCHOSCOPY WITH BRONCHIAL ALVEOLAR LAVAGE - FLEXIBLE Right 11/30/2016    Performed by Shereen Rigg, MD at [facility] OR    ESOPHAGOGASTRODUODENOSCOPY for PEG placement N/A 12/06/2016    Performed by Buckles, Toribio BROCKS, MD at [facility] ENDO    ENDARTERECTOMY CAROTID ARTERY Left 02/17/2018    Performed by Idelia Alverna BROCKS, MD at [facility] OR    ANOMALOUS VENOUS RETURN REPAIR  2017    ECHOCARDIOGRAM PROCEDURE  05/2022    HX CATARACT REMOVAL  2018    HX HEART CATHETERIZATION  2015    KIDNEY STONE SURGERY  2025       Pertinent medical/surgical history reviewed      Social History:  Social History     Tobacco Use    Smoking status: Former     Current packs/day: 0.00     Average packs/day: 1.5 packs/day for 60.0 years (90.0 ttl pk-yrs)     Types: Cigarettes     Start date: 02/13/1958     Quit date: 02/13/2018     Years since quitting: 5.4    Smokeless tobacco: Never    Tobacco comments:     0.5-2 ppd history   Vaping Use    Vaping status: Never Used   Substance Use Topics    Alcohol use: Not Currently    Drug use: Never     Social History     Substance and Sexual Activity   Drug Use Never             Family History:  Family History   Problem Relation Name Age of Onset    Stroke Mother Virginia  Falor     Other Mother Virginia  Shannon         colon cancer    Other Father          valve disease/COPD    Aortic Disease/Dissection Father      Cancer Mother Virginia  Cho         Colon    Heart Disease Father Kashmere Staffa        Vitals:  ED Vitals      Date and Time T BP P RR SPO2P SPO2 User   07/25/23 1842 -- -- 96 26 PER MINUTE --  94 % AC   07/25/23 1839 -- -- 97 25 PER MINUTE --  91 % AC   07/25/23 1700 -- 179/98 99 18 PER MINUTE -- -- AC   07/25/23 1630 -- 169/92 96 23 PER MINUTE -- 96 % AC   07/25/23 1602 -- 172/87 97 -- -- 96 % AC   07/25/23 1441 36.8 ?C (98.2 ?F) 134/71 103  18 PER MINUTE -- 96 % MT            Physical Exam:  Physical Exam  Vitals and nursing note reviewed.   Constitutional:       General: He is awake. He is not in acute distress.     Appearance: Normal appearance. He is well-developed. He is not ill-appearing.   HENT:      Head: Normocephalic and atraumatic.      Right Ear: External ear normal.      Left Ear: External ear normal.      Nose: Nose normal.      Mouth/Throat:      Pharynx: Oropharynx is clear.   Eyes:      General: Lids are normal.      Extraocular Movements: Extraocular movements intact.      Conjunctiva/sclera: Conjunctivae normal.      Pupils: Pupils are equal, round, and reactive to light.   Cardiovascular:      Rate and Rhythm: Normal rate and regular rhythm.      Pulses: Normal pulses.   Pulmonary:      Effort: Pulmonary effort is normal. No respiratory distress.      Breath sounds: Normal breath sounds and air entry. No wheezing, rhonchi or rales.   Abdominal:      General: There is no distension.      Palpations: Abdomen is soft.      Tenderness: There is no abdominal tenderness. There is no guarding or rebound.   Musculoskeletal:         General: No deformity or signs of injury. Normal range of motion.      Cervical back: Normal range of motion and neck supple.      Right lower leg: No edema.      Left lower leg: No edema.   Skin:     General: Skin is warm and dry.   Neurological:      General: No focal deficit present.      Mental Status: He is alert and oriented to person, place, and time. Mental status is at baseline.   Psychiatric:         Mood and Affect: Mood normal.         Behavior: Behavior is cooperative.         Laboratory Results:  Labs Reviewed   CBC AND DIFF - Abnormal       Result Value Ref Range Status    White Blood Cells 15.90 (*) 4.50 - 11.00 10*3/uL Final    Red Blood Cells 4.54  4.40 - 5.50 10*6/uL Final    Hemoglobin 12.4 (*) 13.5 - 16.5 g/dL Final    Hematocrit 62.8 (*) 40.0 - 50.0 % Final    MCV 81.6  80.0 - 100.0 fL Final    MCH 27.3  26.0 - 34.0 pg Final    MCHC 33.4  32.0 - 36.0 g/dL Final    RDW 81.4 (*) 88.9 - 15.0 % Final    Platelet Count 250  150 - 400 10*3/uL Final    MPV 9.0  7.0 - 11.0 fL Final    Neutrophils 79.7 (*) 41.0 - 77.0 % Final    Lymphocytes 6.7 (*) 24.0 - 44.0 % Final    Monocytes 13.1 (*) 4.0 - 12.0 % Final    Eosinophils 0.1  0.0 - 5.0 % Final    Basophils 0.4  0.0 - 2.0 % Final    Absolute Neutrophil Count 12.60 (*) 1.80 -  7.00 10*3/uL Final    Absolute Lymph Count 1.10  1.00 - 4.80 10*3/uL Final    Absolute Monocyte Count 2.10 (*) 0.00 - 0.80 10*3/uL Final    Absolute Eosinophil Count 0.00  0.00 - 0.45 10*3/uL Final    Absolute Basophil Count 0.10  0.00 - 0.20 10*3/uL Final    MDW (Monocyte Distribution Width) 22.6 (*) <=20.6 Final   COMPREHENSIVE METABOLIC PANEL - Abnormal    Sodium 134 (*) 137 - 147 mmol/L Final    Potassium 4.0  3.5 - 5.1 mmol/L Final    Chloride 98  98 - 110 mmol/L Final    Glucose 103 (*) 70 - 100 mg/dL Final    Blood Urea Nitrogen 14  7 - 25 mg/dL Final    Creatinine 8.84  0.40 - 1.24 mg/dL Final    Calcium 8.7  8.5 - 10.6 mg/dL Final    Total Protein 7.2  6.0 - 8.0 g/dL Final    Total Bilirubin 0.8  0.2 - 1.3 mg/dL Final    Albumin  3.4 (*) 3.5 - 5.0 g/dL Final    Alk Phosphatase 55  25 - 110 U/L Final    AST 12  7 - 40 U/L Final    ALT 7  7 - 56 U/L Final    CO2 26  21 - 30 mmol/L Final    Anion Gap 10  3 - 12 Final    Glomerular Filtration Rate (GFR) >60  >60 mL/min Final   URINALYSIS DIPSTICK REFLEX TO CULTURE - Abnormal    Color,UA Yellow   Final    Turbidity,UA 1+ (*) Clear Final    Specific Gravity-Urine 1.017  1.005 - 1.030 Final    pH,UA 6.0  5.0 - 8.0 Final    Protein,UA 2+ (*) Negative Final    Glucose,UA Negative  Negative Final    Ketones,UA Negative  Negative Final    Bilirubin,UA Negative  Negative Final    Blood,UA 3+ (*) Negative Final    Urobilinogen,UA Increased (*) Normal Final    Nitrite,UA Negative  Negative Final    Leukocytes,UA Trace (*) Negative Final   URINALYSIS MICROSCOPIC REFLEX TO CULTURE - Abnormal    WBCs,UA 2 - 10 (*) None, 0 - 2  /HPF Final    RBCs,UA Packed (*) None, 0 - 2  /HPF Final Mucous,UA Trace  None, Trace /LPF Final   LIPASE - Normal    Lipase 16  11 - 82 U/L Final   PROTIME INR (PT)   PTT (APTT)   URINALYSIS, COMPLETE W/REFLEX TO CULTURE    Narrative:     The following orders were created for panel order URINALYSIS, COMPLETE W/REFLEX TO CULTURE.  Procedure                               Abnormality         Status                     ---------                               -----------         ------                     URINALYSIS DIPSTICK REF.SABRASABRA[[id number]]  Abnormal  Final result               URINALYSIS MICROSCOPIC .SABRA.[[id number]]  Abnormal            Final result               EXTRA CLEAR URINE TUBE[[id number]]                          In process                 EXTRA URINE GRAY UNE[[id number]]                            In process                   Please view results for these tests on the individual orders.   EXTRA CLEAR URINE TUBE   EXTRA URINE GRAY TOP          Radiology Interpretation:  CT ABD/PELV W CONTRAST   Final Result         1.  3 mm right mid ureteral calculus, with minimal upstream hydroureteronephrosis.   2.  Additional nonobstructing bilateral renal calculi. No left hydronephrosis.   3.  Mild bladder wall thickening, which may be secondary to chronic outlet obstruction; though cystitis could appear similar. Correlation with urinalysis is recommended.   4.  Trace pleural effusions. Adjacent patchy opacities are likely atelectasis, though infection and/or aspiration could contribute.   5.  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   6.  Cholelithiasis.      By my electronic signature, I attest that I have personally reviewed the images for this examination and formulated the interpretations and opinions expressed in this report          Finalized by Fairy DOROTHA Hockey, MD on 07/25/2023 5:41 PM. Dictated by Dorthea Court, MD on 07/25/2023 5:16 PM.      CHEST SINGLE VIEW   Final Result         Bilateral lower lung mixed pulmonary opacities, similar on the right and increased on the left. These are nonspecific though favored for pneumonia/aspiration. A component of atelectasis or scarring may also be present.      Probable small left and possible trace right pleural effusions.          Finalized by Norleen Pane, M.D. on 07/25/2023 5:31 PM. Dictated by Norleen Pane, M.D. on 07/25/2023 5:28 PM.          EKG:      Medical Decision Making:  Javier Gutierrez is a 83 y.o. male who presents with chief complaint as listed above. Based on the history and presentation, the list of differential diagnoses considered included, but was not limited to, nephrolithiasis, pyelonephritis, UTI    ED Course  ED Course as of 07/25/23 1858   Thu Jul 25, 2023   979 83 year old male with history of asthma/COPD, HFrEF, A-fib on Eliquis , HTN, PAD, CVA with expressive aphasia, CKD who presents to the ED for right-sided flank pain.    Prior records reviewed.    Vitals notable for tachycardia on arrival, saturating well on room air.  Clinically, patient is chronically ill-appearing with an otherwise reassuring physical exam.  No acute respiratory distress and abdomen is soft/nontender.    Will obtain labs, UA, rest x-ray, CT abdomen/pelvis for further evaluation.   [DR]   1628 White Blood  Cells(!): 15.90 [DR]   1704 Sodium(!): 134 [DR]   1741 RBCs,UA(!): Packed [DR]   1741 WBCs,UA(!): 2 - 10 [DR]   1746 Labs notable for leukocytosis suggestive of infection.  UA with hematuria and CT concerning for 3 mm right mid ureteral stone.  Chest x-ray suggestive of nonspecific findings in the bilateral lower lungs which are favored to be pneumonia/aspiration.    Given leukocytosis and above findings, concern for likely aspiration pneumonia, less likely UTI.    After consulting with ED pharmacy, will initiate patient on antibiotics with Zosyn  and doxycycline  (will avoid cephalosporins in the setting of recent neurotoxicity from cefepime  patient is still somewhat recovering from).    On reevaluation, patient is largely unchanged although noted to be a little more tachypneic and desaturating as low as 88% on room air.  No acute respiratory distress however will place patient on new O2 requirement of 2 L.    Findings were discussed with patient and daughter at bedside and the plan for admission, they are agreeable.      Daughter also voices wanting to prioritize for patient to be at home and focus on quality of life and requesting to again discuss goals of care with social work/palliative during this hospitalization.   [DR]      ED Course User Index  [DR] Idell Cuff, MD       Complexity of Problems Addressed  Patient's active diagnoses as well as contributing pre-existing medical problems include:  Clinical Impression   Aspiration pneumonia of both lower lobes, unspecified aspiration pneumonia type (CMS-HCC)   Acute respiratory failure with hypoxia (CMS-HCC)   Right ureteral stone   Hyponatremia   Elevated blood pressure reading          Additional data reviewed:    History was obtained from an independent historian: Not in addition to what is mentioned above  Prior non-ED notes reviewed: Not in addition to what is mentioned above  Independent interpretation of diagnostic tests was performed by me: Not in addition to what is mentioned above  Patient presentation/management was discussed with the following qualified health care professionals and/or other relevant professionals: Not in addition to what is mentioned above    Risk evaluation:    Diagnosis or treatment of patient condition impacted by social determinant of health: None  Tests Considered but not performed due to clinical scoring (if not mentioned in ED course, aside from what is implied by clinical scores listed): n/a  Rationale regarding whether admission or escalation of care considered if not performed (if not mentioned in ED course, aside from what is implied by clinical scores listed): n/a    ED Scoring: Facility Administered Meds:  Medications   piperacillin /tazobactam (ZOSYN ) 4.5 g in sodium chloride  0.9% (NS) 100 mL IVPB (MB+) (has no administration in time range)   doxycycline  (VIBRAMYCIN ) 100 mg in sodium chloride  0.9% (NS) 100 mL IVPB (MB+) (has no administration in time range)   iohexoL  (OMNIPAQUE -350) 350 mg/mL injection 100 mL (100 mL Intravenous Given 07/25/23 1718)   sodium chloride  PF 0.9% injection 50 mL (50 mL Intravenous Given 07/25/23 1717)       Clinical Impression:  Clinical Impression   Aspiration pneumonia of both lower lobes, unspecified aspiration pneumonia type (CMS-HCC)   Acute respiratory failure with hypoxia (CMS-HCC)   Right ureteral stone   Hyponatremia   Elevated blood pressure reading       Disposition/Follow up  ED Disposition     ED Disposition   Admit  No follow-up provider specified.    Medications:  New Prescriptions    No medications on file       Procedure Notes:  Procedures       Attestation / Supervision:  I, Ola Thabit, am scribing for and in the presence of Lacora Folmer, MD.  Rosita Thabit    Note has been documented by Rosita Sou on 07/25/2023    I, Tabby Beaston Idell, MD, personally performed the services described in this documentation as scribed and it is both accurate and complete.  Reeda Idell, MD

## 2023-07-25 NOTE — Consults
 [facility] Consult Note  07/25/2023       Patient: Javier Gutierrez  MRN: [id number]    Admission Date:  07/25/2023, LOS: 0 days  Admission Diagnosis: Abdominal pain [R10.9]  Date of Service: July 25, 2023    Reason for Consult: 3 mm right mid ureteral calculus, with minimal upstream hydroureteronephrosis   Referring Provider: Idell Cuff, MD;Javier Gutierrez*  Attending Surgeon: Dr. Collins   Consult Performed by: Javier DELENA Forts, MD    ASSESSMENT: 83 y.o. male with LEFT atrophic appearing kidney, COPD, CAD, HLD, HTN, Afib, PAD (plavix /eliquis  use in setting of TAVR and carotid stent) and 3mm right proximal ureteral calculus with adequate symptom control and without overt indication of concomitant urinary tract infection. CT imaging personally reviewed. Minimal upstream hydroureteronephrosis on right. Bilateral nonobstructing renal stones.     Of note his LEFT kidney appears atrophic and was reportedly previously stented many years ago by urologists, so this is essentially ureteral stone (despite small size) in functionally solitary kidney. Patient is largely nonverbal during encounter, daughter who reports she has medical decision making power highlights his ability to pass 4 mm LEFT-sided stone previously and concern for anesthesia risk which is very understandable.     He is unable to tolerate flomax and cardiorespiratory status limits ability to fluid overload to attempt to encourage stone passage. He is currently afebrile and HDS (hypertensive).     We discussed standard of care for stone in functionally solitary kidney is ureteral stent placement. This is further complicated by elevated WBC (15.9). Discussed CXR/CT findings possibly representing infection with daughter notes possible respiratory infection. UA is without nitrites, trace leukocytes with 2-10 WBCs; could be influenced by antibiotics. INR 2.5 NT-Pro-BNP 5,896. Cr is WNL, 1.15 (1.31). I discussed nuanced situation with daughter who is concerned about surgical/anesthetic risk, understands recommendation, and via shared decision making we will plan to proceed to OR 7/4 AM for ureteral stenting, possible treatment; strain urine in interim.     PLAN:  - Please keep NPO for cystoscopy with RIGHT ureteral stent placement, possible treatment as outlined on consent 7/4 AM - should patient decompensate, please page Urology on-call for urgent intervention        > Risks and benefits discussed and daughter signed consent to proceed.        > RIGHT side marked   - Pain control, antibiotics, and all other care per primary team; Urology will follow   - Please provide patient with urine strainer, patient to strain all urine  - If stone passage detected, please collect stone so that it may be sent for stone analysis    Discussed with staff. Thank you for consulting Urology.     Javier DELENA Forts, MD  Urology Resident  Please page Urology on-call with any questions  __________________________________________________________________________________    HPI: Javier Gutierrez is a 83 y.o. male with who presented to the ER with abdominal pain, particularly right flank discomfort, starting yesterday. They were found to have 3mm right mid ureteral calculus with minimal upstream hydroureteronephrosis on CT, prompting Urology consultation.    Pain is presently well-controlled.   (-) urinary symptoms such as dysuria, gross hematuria  (+) history of kidney stones  (+) prior evaluation by a urologist  (-) chills  (-) fevers  (-) nausea/vomiting  NPO status: LOI noon     Last dose Eliquis  7/3 AM   Last dose Plavix  believed 7/2 AM     Past Medical History:    Acute on  chronic systolic heart failure, NYHA class 2 (CMS-HCC)    Amyloidosis (CMS-HCC)    Aortic valve stenosis, mild    Arrhythmia    Arterial occlusion    Arthritis    Asthma    CAD (coronary artery disease)    Cardiomyopathy (CMS-HCC)    Carotid artery plaque    Cataract    CHF (congestive heart failure) (CMS-HCC)    Chronic kidney disease    Colon polyps    COPD (chronic obstructive pulmonary disease) (CMS-HCC)    Coronary atherosclerosis    Dizziness    Dyspnea    Frailty    H/O: CVA (cardiovascular accident)    History of renal artery stenosis    HTN    Hyperlipidemia    Hyperlipidemia    Hypertension    Hypothyroidism    Kidney disease    Kidney stones    Lung disease    Peripheral vascular disease    Tobacco abuse    Valvular heart disease       Surgical History:   Procedure Laterality Date    HX LUMBAR DISKECTOMY  01/22/1966    30 years ago    STENT INTRAVASCULAR  01/23/2004    kidney stent placed    Left Heart Catheterization With Ventriculogram Left 03/11/2015    Performed by Javier Maude SAILOR, MD, [facility] at [facility] CATH LAB    Coronary Angiography N/A 03/11/2015    Performed by Javier Maude SAILOR, MD, [facility] at [facility] CATH LAB    Possible Percutaneous Coronary Intervention N/A 03/11/2015    Performed by Javier Maude SAILOR, MD, [facility] at [facility] CATH LAB    ESOPHAGOGASTRODUODENOSCOPY N/A 04/26/2015    Performed by Javier Emerick PARAS, DO at [facility] ENDO    ESOPHAGOGASTRODUODENOSCOPY BIOPSY  04/26/2015    Performed by Javier Emerick PARAS, DO at [facility] ENDO    REPLACEMENT TRANSCATHETER AORTIC VALVE (Sapien 26s3), right common femoral artery approach N/A 08/03/2015    Performed by Javier Cordella FALCON, MD at [facility] CVOR    BRONCHOSCOPY Bilateral 11/30/2016    Performed by Javier Rigg, MD at [facility] OR    BRONCHOSCOPY WITH BRONCHIAL ALVEOLAR LAVAGE - FLEXIBLE Right 11/30/2016    Performed by Javier Rigg, MD at [facility] OR    ESOPHAGOGASTRODUODENOSCOPY for PEG placement N/A 12/06/2016    Performed by Buckles, Toribio BROCKS, MD at [facility] ENDO    ENDARTERECTOMY CAROTID ARTERY Left 02/17/2018    Performed by Javier Alverna BROCKS, MD at [facility] OR    ANOMALOUS VENOUS RETURN REPAIR  2017    ECHOCARDIOGRAM PROCEDURE  05/2022    HX CATARACT REMOVAL  2018    HX HEART CATHETERIZATION  2015    KIDNEY STONE SURGERY  2025       Medications:  Scheduled Meds:apixaban  (ELIQUIS ) tablet 2.5 mg, 2.5 mg, Oral, BID  arformoteroL  (BROVANA ) nebulizer solution 15 mcg, 15 mcg, Inhalation, BID  atorvastatin  (LIPITOR) tablet 20 mg, 20 mg, Oral, QDAY  budesonide  (PULMICORT ) nebulizer solution 0.5 mg, 0.5 mg, Inhalation, BID  clopiDOGreL  (PLAVIX ) tablet 75 mg, 75 mg, Oral, QDAY  doxycycline  (VIBRAMYCIN ) 100 mg in sodium chloride  0.9% (NS) 100 mL IVPB (MB+), 100 mg, Intravenous, ONCE  doxycycline  hyclate (VIBRACIN) tablet 100 mg, 100 mg, Oral, BID  dutasteride  (AVODART ) capsule 0.5 mg, 0.5 mg, Oral, QDAY  [START ON 07/26/2023] levothyroxine  (SYNTHROID ) tablet 88 mcg, 88 mcg, Oral, QDAY(07)  metoprolol  succinate XL (TOPROL  XL) tablet 12.5 mg, 12.5 mg, Oral, QDAY  mirabegron  (MYRBETRIQ ) ER tablet 25 mg, 25 mg, Oral,  QDAY  montelukast  (SINGULAIR ) tablet 10 mg, 10 mg, Oral, QHS  pantoprazole  DR (PROTONIX ) tablet 40 mg, 40 mg, Oral, QDAY  piperacillin /tazobactam (ZOSYN ) 4.5 g in sodium chloride  0.9% (NS) 100 mL IVPB (MB+), 4.5 g, Intravenous, ONCE  piperacillin /tazobactam (ZOSYN ) 4.5 g in sodium chloride  0.9% (NS) 100 mL IVPB (MB+), 4.5 g, Intravenous, Q6H*    Continuous Infusions:  PRN and Respiratory Meds:      Allergies:  Tamsulosin and Cefepime     Social History     Socioeconomic History    Marital status: Widowed   Tobacco Use    Smoking status: Former     Current packs/day: 0.00     Average packs/day: 1.5 packs/day for 60.0 years (90.0 ttl pk-yrs)     Types: Cigarettes     Start date: 02/13/1958     Quit date: 02/13/2018     Years since quitting: 5.4    Smokeless tobacco: Never    Tobacco comments:     0.5-2 ppd history   Vaping Use    Vaping status: Never Used   Substance and Sexual Activity    Alcohol use: Not Currently    Drug use: Never    Sexual activity: Not Currently     Partners: Female       Family History   Problem Relation Name Age of Onset    Stroke Mother Virginia  Rosal     Other Mother Virginia  Mullet         colon cancer    Other Father          valve disease/COPD    Aortic Disease/Dissection Father      Cancer Mother Virginia  Blincoe Colon    Heart Disease Father Rain Friedt        Vitals:  Vital Signs: Last Filed In 24 Hours Vital Signs: 24 Hour Range   BP: 179/98 (07/03 1700)  Temp: 36.8 ?C (98.2 ?F) (07/03 1441)  Pulse: 99 (07/03 1700)  Respirations: 18 PER MINUTE (07/03 1700)  SpO2: 96 % (07/03 1630) BP: (134-179)/(71-98)   Temp:  [36.8 ?C (98.2 ?F)]   Pulse:  [96-103]   Respirations:  [18 PER MINUTE-23 PER MINUTE]   SpO2:  [96 %]           Intake/Output:  No intake or output data in the 24 hours ending 07/25/23 1841    Physical Exam:   Gen: NAD  HEENT: Grossly normocephalic, atraumatic. Wearing mask.   Pulm: Unlabored   CV: Normal rate  Abdomen: No overt distension   Neuro: Awake, able to interact     ROS:  Per HPI    Lab/Radiology/Other Diagnostic Tests:  Recent Labs     07/25/23  1551 07/25/23  1731   HGB 12.4*  --    HCT 37.1*  --    WBC 15.90*  --    PLTCT 250  --    NA 134*  --    K 4.0  --    CL 98  --    CO2 26  --    BUN 14  --    CR 1.15  --    GLU 103*  --    CA 8.7  --    ALBUMIN  3.4*  --    TOTPROT 7.2  --    TOTBILI 0.8  --    AST 12  --    ALT 7  --    ALKPHOS 55  --    LIPASE 16  --  INR  --  2.5*   PT  --  28.0*   PTT  --  30.3     Glucose: (!) 103 (07/25/23 1551)    Principal Problem:    Abdominal pain

## 2023-07-26 ENCOUNTER — Encounter: Admit: 2023-07-26 | Discharge: 2023-07-26 | Payer: MEDICARE

## 2023-07-26 ENCOUNTER — Observation Stay: Admit: 2023-07-26 | Discharge: 2023-07-26 | Payer: MEDICARE

## 2023-07-26 LAB — ECG 12-LEAD
P AXIS: 21 degrees
P-R INTERVAL: 170 ms
Q-T INTERVAL: 412 ms
QRS DURATION: 122 ms
QTC CALCULATION (BAZETT): 520 ms
R AXIS: -59 degrees
T AXIS: -13 degrees
VENTRICULAR RATE: 96 {beats}/min

## 2023-07-26 LAB — HIGH SENSITIVITY TROPONIN I, RANDOM: ~~LOC~~ BKR HIGH SENSITIVITY TROPONIN I: 50 ng/L — ABNORMAL HIGH (ref <20)

## 2023-07-26 LAB — NT-PRO-BNP: ~~LOC~~ BKR NT-PRO-BNP: 589 pg/mL — ABNORMAL HIGH (ref <450)

## 2023-07-26 LAB — CBC AND DIFF
~~LOC~~ BKR ABSOLUTE BASO COUNT: 0.1 10*3/uL — ABNORMAL HIGH (ref 0.00–0.20)
~~LOC~~ BKR ABSOLUTE EOS COUNT: 0 10*3/uL — ABNORMAL LOW (ref 0.00–0.45)

## 2023-07-26 LAB — MRSA BY PCR (NASAL)

## 2023-07-26 LAB — PROCALCITONIN: ~~LOC~~ BKR PROCALCITONIN: 0.1 ng/mL

## 2023-07-26 MED ORDER — ACETAMINOPHEN 1,000 MG/100 ML (10 MG/ML) IV SOLN
1000 mg | Freq: Once | INTRAVENOUS | 0 refills | Status: DC | PRN
Start: 2023-07-26 — End: 2023-07-26

## 2023-07-26 MED ORDER — LACTATED RINGERS IV BOLUS
250 mL | Freq: Once | INTRAVENOUS | 0 refills | Status: CP
Start: 2023-07-26 — End: ?
  Administered 2023-07-27: 01:00:00 250 mL via INTRAVENOUS

## 2023-07-26 MED ORDER — ROCURONIUM 10 MG/ML IV SOLN
INTRAVENOUS | 0 refills | Status: DC
Start: 2023-07-26 — End: 2023-07-26

## 2023-07-26 MED ORDER — LIDOCAINE (PF) 200 MG/10 ML (2 %) IJ SYRG
INTRAVENOUS | 0 refills | Status: DC
Start: 2023-07-26 — End: 2023-07-26

## 2023-07-26 MED ORDER — FENTANYL CITRATE (PF) 50 MCG/ML IJ SOLN
50 ug | INTRAVENOUS | 0 refills | Status: DC | PRN
Start: 2023-07-26 — End: 2023-07-26

## 2023-07-26 MED ORDER — DEXAMETHASONE SODIUM PHOSPHATE 4 MG/ML IJ SOLN
INTRAVENOUS | 0 refills | Status: DC
Start: 2023-07-26 — End: 2023-07-26

## 2023-07-26 MED ORDER — FENTANYL CITRATE (PF) 50 MCG/ML IJ SOLN
INTRAVENOUS | 0 refills | Status: DC
Start: 2023-07-26 — End: 2023-07-26

## 2023-07-26 MED ORDER — ACETAMINOPHEN 500 MG PO TAB
1000 mg | Freq: Once | ORAL | 0 refills | Status: DC
Start: 2023-07-26 — End: 2023-07-26

## 2023-07-26 MED ORDER — ARTIFICIAL TEARS (PF) SINGLE DOSE DROPS GROUP
OPHTHALMIC | 0 refills | Status: DC
Start: 2023-07-26 — End: 2023-07-26

## 2023-07-26 MED ORDER — DIPHENHYDRAMINE HCL 50 MG/ML IJ SOLN
25 mg | Freq: Once | INTRAVENOUS | 0 refills | Status: DC | PRN
Start: 2023-07-26 — End: 2023-07-26

## 2023-07-26 MED ORDER — PROPOFOL INJ 10 MG/ML IV VIAL
INTRAVENOUS | 0 refills | Status: DC
Start: 2023-07-26 — End: 2023-07-26

## 2023-07-26 MED ORDER — HYOSCYAMINE SULFATE 0.125 MG PO TBDI
.125 mg | SUBLINGUAL | 0 refills | Status: DC | PRN
Start: 2023-07-26 — End: 2023-08-01

## 2023-07-26 MED ORDER — SODIUM CHLORIDE 0.9% IV BOLUS
1000 mL | Freq: Once | INTRAVENOUS | 0 refills | Status: DC
Start: 2023-07-26 — End: 2023-07-26

## 2023-07-26 MED ORDER — ONDANSETRON HCL (PF) 4 MG/2 ML IJ SOLN
INTRAVENOUS | 0 refills | Status: DC
Start: 2023-07-26 — End: 2023-07-26

## 2023-07-26 MED ORDER — SODIUM CHLORIDE 0.9% IV SOLP
INTRAVENOUS | 0 refills | Status: DC
Start: 2023-07-26 — End: 2023-07-26

## 2023-07-26 MED ORDER — PHENYLEPHRINE IN 0.9% NACL (STD CONC) (PREMADE)(AM)(OR)
INTRAVENOUS | 0 refills | Status: DC
Start: 2023-07-26 — End: 2023-07-26
  Administered 2023-07-26: 15:00:00 .3 ug/kg/min via INTRAVENOUS

## 2023-07-26 MED ORDER — OXYCODONE 5 MG PO TAB
5 mg | ORAL | 0 refills | Status: DC | PRN
Start: 2023-07-26 — End: 2023-07-26

## 2023-07-26 MED ORDER — ACETAMINOPHEN 500 MG PO TAB
1000 mg | Freq: Once | ORAL | 0 refills | Status: DC | PRN
Start: 2023-07-26 — End: 2023-07-26

## 2023-07-26 MED ORDER — PROPOFOL 10 MG/ML IV EMUL 20 ML (INFUSION)(AM)(OR)
INTRAVENOUS | 0 refills | Status: DC
Start: 2023-07-26 — End: 2023-07-26
  Administered 2023-07-26: 15:00:00 80 ug/kg/min via INTRAVENOUS

## 2023-07-26 MED ORDER — FENTANYL CITRATE (PF) 50 MCG/ML IJ SOLN
25 ug | INTRAVENOUS | 0 refills | Status: DC | PRN
Start: 2023-07-26 — End: 2023-07-26

## 2023-07-26 MED ORDER — SUGAMMADEX 100 MG/ML IV SOLN
INTRAVENOUS | 0 refills | Status: DC
Start: 2023-07-26 — End: 2023-07-26

## 2023-07-26 MED ORDER — HYDROMORPHONE (PF) 2 MG/ML IJ SYRG
.2 mg | INTRAVENOUS | 0 refills | Status: DC | PRN
Start: 2023-07-26 — End: 2023-07-26

## 2023-07-26 MED ADMIN — SODIUM CHLORIDE 0.9% IV SOLP [27838]: 1000 mL | INTRAVENOUS | @ 14:00:00 | Stop: 2023-07-26 | NDC 00338004904

## 2023-07-26 MED ADMIN — SODIUM CHLORIDE 0.9% IV SOLP [27838]: 250 mL | INTRAVENOUS | @ 06:00:00 | Stop: 2023-07-26 | NDC 00338004902

## 2023-07-26 NOTE — Progress Notes
 Update:  83 y.o. male with LEFT atrophic appearing kidney, COPD, CAD, HLD, HTN, Afib, PAD (plavix /eliquis  use in setting of TAVR and carotid stent) and 3mm right proximal ureteral calculus with adequate symptom control and without overt indication of concomitant urinary tract infection.     He is now s/p right ureteroscopy with right ureteral stent placement (6x26 JJ without strings).     Plan:  - Okay to resume diet (ordered)  - Maintain right ureteral stent     > Levsin  0.125mg  added for bladder spasm management  - Will arrange outpatient stone treatment  - Remainder of care per primary team  - Urology will sign off. Please page or call with questions    Camelia Maroon, MD  Urology Resident

## 2023-07-26 NOTE — Progress Notes
 SPEECH-LANGUAGE PATHOLOGY  NO TREATMENT NOTE     Orders received and appreciated for clinical swallow evaluation. Pt too lethargic for participation at this time so no PO trials given. Discussed via telephone w/ daughter. Wishes to initiate puree solid and moderately thick liquid diet (on modified smaller minced texture at home) prior to SLP evaluation. Additionally, family w/ questions regarding nutritional supplement thickening. This SLP will provide nutrition supplement thickening handout. Will f/u for completion of swallow evaluation.     Therapist: Carmelita Moores, SLP MA, [facility] Voalte# [phone]  Date: 07/26/2023

## 2023-07-26 NOTE — Progress Notes
 [place] [facility] Consult Progress Note  07/26/2023       Patient: Javier Gutierrez  MRN: [id number]    Admission Date:  07/25/2023, LOS: 0 days  Admission Diagnosis: Abdominal pain [R10.9]  Date of Service: July 26, 2023    ASSESSMENT: 83 y.o. male with LEFT atrophic appearing kidney, COPD, CAD, HLD, HTN, Afib, PAD (plavix /eliquis  use in setting of TAVR and carotid stent) and 3mm right proximal ureteral calculus with adequate symptom control and without overt indication of concomitant urinary tract infection. CT imaging personally reviewed. Minimal upstream hydroureteronephrosis on right. Bilateral nonobstructing renal stones.     Of note his LEFT kidney appears atrophic and was reportedly previously stented many years ago by urologists, so this is essentially ureteral stone (despite small size) in functionally solitary kidney. Patient is largely nonverbal during encounter, daughter who reports she has medical decision making power highlights his ability to pass 4 mm LEFT-sided stone previously and concern for anesthesia risk which is very understandable.     He is unable to tolerate flomax and cardiorespiratory status limits ability to fluid overload to attempt to encourage stone passage. He is currently afebrile and HDS (hypertensive).     We discussed standard of care for stone in functionally solitary kidney is ureteral stent placement. This is further complicated by elevated WBC (15.9). Discussed CXR/CT findings possibly representing infection with daughter notes possible respiratory infection. UA is without nitrites, trace leukocytes with 2-10 WBCs; could be influenced by antibiotics. INR 2.5 NT-Pro-BNP 5,896. Cr is WNL, 1.15 (1.31). I discussed nuanced situation with daughter who is concerned about surgical/anesthetic risk, understands recommendation, and via shared decision making we will plan to proceed to OR 7/4 AM for ureteral stenting, possible treatment; strain urine in interim.     7/4: Afebrile, HDS on 2L NC. Tachycardia noted this morning. WBC 18.5 (15.9), Cr 1.40 (1.15). Proceed to OR today for RIGHT ureteral stent placement (okay for MAC from Urology perspective).     PLAN:  - Please keep NPO for cystoscopy with RIGHT ureteral stent placement, possible treatment as outlined on consent - should patient decompensate, please page Urology on-call for urgent intervention        > Risks and benefits were discussed and daughter signed consent to proceed.        > RIGHT side marked   - Pain control, antibiotics, and all other care per primary team; Urology will follow   - Please provide patient with urine strainer, patient to strain all urine  - If stone passage detected, please collect stone so that it may be sent for stone analysis    Discussed with staff. Thank you for consulting Urology.     Melba DELENA Forts, MD  Urology Resident  Please page Urology on-call with any questions  __________________________________________________________________________________    HPI: No acute events overnight. Received 40 mg IV lasix . NPO; No reports of stone passage. Daughter at bedside. Ready for OR      In review from original consult 07/25/23:   Javier Gutierrez is a 83 y.o. male with who presented to the ER with abdominal pain, particularly right flank discomfort, starting yesterday. They were found to have 3mm right mid ureteral calculus with minimal upstream hydroureteronephrosis on CT, prompting Urology consultation.    Pain is presently well-controlled.   (-) urinary symptoms such as dysuria, gross hematuria  (+) history of kidney stones  (+) prior evaluation by a urologist  (-) chills  (-) fevers  (-) nausea/vomiting  NPO  status: LOI noon     Last dose Eliquis  7/3 AM   Last dose Plavix  believed 7/2 AM     Past Medical History:    Acute on chronic systolic heart failure, NYHA class 2 (CMS-HCC)    Amyloidosis (CMS-HCC)    Aortic valve stenosis, mild    Arrhythmia    Arterial occlusion    Arthritis    Asthma    CAD (coronary artery disease)    Cardiomyopathy (CMS-HCC)    Carotid artery plaque    Cataract    CHF (congestive heart failure) (CMS-HCC)    Chronic kidney disease    Colon polyps    COPD (chronic obstructive pulmonary disease) (CMS-HCC)    Coronary atherosclerosis    Dizziness    Dyspnea    Frailty    H/O: CVA (cardiovascular accident)    History of renal artery stenosis    HTN    Hyperlipidemia    Hyperlipidemia    Hypertension    Hypothyroidism    Kidney disease    Kidney stones    Lung disease    Peripheral vascular disease    Tobacco abuse    Valvular heart disease       Surgical History:   Procedure Laterality Date    HX LUMBAR DISKECTOMY  01/22/1966    30 years ago    STENT INTRAVASCULAR  01/23/2004    kidney stent placed    Left Heart Catheterization With Ventriculogram Left 03/11/2015    Performed by Lannis Maude SAILOR, MD, [facility] at [facility] CATH LAB    Coronary Angiography N/A 03/11/2015    Performed by Lannis Maude SAILOR, MD, [facility] at [facility] CATH LAB    Possible Percutaneous Coronary Intervention N/A 03/11/2015    Performed by Lannis Maude SAILOR, MD, [facility] at [facility] CATH LAB    ESOPHAGOGASTRODUODENOSCOPY N/A 04/26/2015    Performed by Leonce Emerick PARAS, DO at [facility] ENDO    ESOPHAGOGASTRODUODENOSCOPY BIOPSY  04/26/2015    Performed by Leonce Emerick PARAS, DO at [facility] ENDO    REPLACEMENT TRANSCATHETER AORTIC VALVE (Sapien 26s3), right common femoral artery approach N/A 08/03/2015    Performed by Tony Cordella FALCON, MD at [facility] CVOR    BRONCHOSCOPY Bilateral 11/30/2016    Performed by Shereen Rigg, MD at [facility] OR    BRONCHOSCOPY WITH BRONCHIAL ALVEOLAR LAVAGE - FLEXIBLE Right 11/30/2016    Performed by Shereen Rigg, MD at [facility] OR    ESOPHAGOGASTRODUODENOSCOPY for PEG placement N/A 12/06/2016    Performed by Buckles, Toribio BROCKS, MD at [facility] ENDO    ENDARTERECTOMY CAROTID ARTERY Left 02/17/2018    Performed by Idelia Alverna BROCKS, MD at [facility] OR    ANOMALOUS VENOUS RETURN REPAIR  2017    ECHOCARDIOGRAM PROCEDURE  05/2022    HX CATARACT REMOVAL  2018    HX HEART CATHETERIZATION 2015    KIDNEY STONE SURGERY  2025       Medications:  Scheduled Meds:albuterol -ipratropium (DUONEB) nebulizer solution 3 mL, 3 mL, Inhalation, TID & PRN  [Held by Provider] apixaban  (ELIQUIS ) tablet 2.5 mg, 2.5 mg, Oral, BID  arformoteroL  (BROVANA ) nebulizer solution 15 mcg, 15 mcg, Inhalation, BID  atorvastatin  (LIPITOR) tablet 20 mg, 20 mg, Oral, QDAY  budesonide  (PULMICORT ) nebulizer solution 0.5 mg, 0.5 mg, Inhalation, BID  [Held by Provider] clopiDOGreL  (PLAVIX ) tablet 75 mg, 75 mg, Oral, QDAY  doxycycline  (VIBRAMYCIN ) 100 mg in sodium chloride  0.9% (NS) 100 mL IVPB (MB+), 100 mg, Intravenous, BID  dutasteride  (AVODART ) capsule 0.5 mg, 0.5  mg, Oral, QDAY  levothyroxine  (SYNTHROID ) tablet 88 mcg, 88 mcg, Oral, QDAY(07)  metoprolol  succinate XL (TOPROL  XL) tablet 12.5 mg, 12.5 mg, Oral, QDAY  mirabegron  (MYRBETRIQ ) ER tablet 25 mg, 25 mg, Oral, QDAY  montelukast  (SINGULAIR ) tablet 10 mg, 10 mg, Oral, QHS  pantoprazole  DR (PROTONIX ) tablet 40 mg, 40 mg, Oral, QDAY  piperacillin /tazobactam (ZOSYN ) 4.5 g in sodium chloride  0.9% (NS) 100 mL IVPB (MB+), 4.5 g, Intravenous, Q6H*    Continuous Infusions:  PRN and Respiratory Meds:albuterol  sulfate PRN, melatonin QHS PRN, polyethylene glycol 3350  QDAY PRN, sennosides-docusate sodium  QDAY PRN      Allergies:  Tamsulosin and Cefepime     Social History     Socioeconomic History    Marital status: Widowed   Tobacco Use    Smoking status: Former     Current packs/day: 0.00     Average packs/day: 1.5 packs/day for 60.0 years (90.0 ttl pk-yrs)     Types: Cigarettes     Start date: 02/13/1958     Quit date: 02/13/2018     Years since quitting: 5.4    Smokeless tobacco: Never    Tobacco comments:     0.5-2 ppd history   Vaping Use    Vaping status: Never Used   Substance and Sexual Activity    Alcohol use: Not Currently    Drug use: Never    Sexual activity: Not Currently     Partners: Female       Family History   Problem Relation Name Age of Onset    Stroke Mother Virginia  Norkus     Other Mother Virginia  Hoaglund         colon cancer    Other Father          valve disease/COPD    Aortic Disease/Dissection Father      Cancer Mother Virginia  Cromie         Colon    Heart Disease Father Aldon Hengst        Vitals:  Vital Signs: Last Filed In 24 Hours Vital Signs: 24 Hour Range   BP: 115/55 (07/04 0731)  Temp: 37 ?C (98.6 ?F) (07/04 9268)  Pulse: 111 (07/04 0731)  Respirations: 22 PER MINUTE (07/04 0731)  SpO2: 92 % (07/04 0737)  O2 Device: Nasal cannula (07/04 0737)  O2 Liter Flow: 2 Lpm (07/04 0737) BP: (107-183)/(55-98)   Temp:  [36.7 ?C (98.1 ?F)-37.3 ?C (99.1 ?F)]   Pulse:  [93-111]   Respirations:  [18 PER MINUTE-26 PER MINUTE]   SpO2:  [91 %-98 %]   O2 Device: Nasal cannula  O2 Liter Flow: 2 Lpm          Intake/Output:    Intake/Output Summary (Last 24 hours) at 07/26/2023 0740  Last data filed at 07/26/2023 9378  Gross per 24 hour   Intake 100 ml   Output 700 ml   Net -600 ml       Physical Exam:   Gen: NAD  HEENT: Grossly normocephalic, atraumatic.  Pulm: Unlabored with NC in place   CV: Tachycardic rate  Neuro: Awake, able to interact (nonverbal during today's encounter)     ROS:  Per HPI    Lab/Radiology/Other Diagnostic Tests:  Recent Labs     07/25/23  1551 07/25/23  1731 07/26/23  0520   HGB 12.4*  --  11.8*   HCT 37.1*  --  34.9*   WBC 15.90*  --  18.50*   PLTCT 250  --  240  NA 134*  --  137   K 4.0  --  3.6   CL 98  --  101   CO2 26  --  25   BUN 14  --  15   CR 1.15  --  1.40*   GLU 103*  --  125*   CA 8.7  --  8.2*   ALBUMIN  3.4*  --   --    TOTPROT 7.2  --   --    TOTBILI 0.8  --   --    AST 12  --   --    ALT 7  --   --    ALKPHOS 55  --   --    LIPASE 16  --   --    INR  --  2.5*  --    PT  --  28.0*  --    PTT  --  30.3  --      Glucose: (!) 125 (07/26/23 0520)    Principal Problem:    Abdominal pain

## 2023-07-26 NOTE — Consults
 Heart Failure Consult    NAME:Javier Gutierrez             MRN: [id number]                 DOB:1940/06/29          AGE: 83 y.o.  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 If not taking oral medications, could give IV metoprolol  2.5mg  perioperatively.  He is minimally hypervolemic on exam today. He got 40mg  IV Lasix  late last night. We will see how he diureses today and order as needed doses of diuretic postoperatively. Could consider adding low dose spironolactone , SGLT2 depending on renal function, BP.  Resume Eliquis  as soon as urology feels this is safe. He has also been on Plavix  for years. Can discuss whether he needs to continue Plavix  long term as well as the Eliquis  (hx CVA, carotid stenting, renal artery stenting, PAF). He could also be considered for Watchman implantation as he is at risk for falls with his orthostasis.    Ongoing:  BMP, magnesium  level daily.  Keep Potassium greater than 4.0 and Magnesium  greater than 2.0.  2000mg  sodium dietary restriction.  Fluid Restriction: 2L  Strict I/O.   Daily standing scale weight.     Primary team responsible for placing [facility] Appointment Request order set for Cardiology: Heart Failure appointment request within 24-48 hours of discharge. (Please do not place order any earlier in effort to reduce possible need for cancellation and rescheduling of visit).    Pt examined and discussed with Dr. Pierpoline.    Corean Harness, PA-C Interventional Cardiology  Pager [phone]/Available on Voalte    ________________________________________________________________________________________________________________________________________________________________________  Assessment:   Acute combined systolic/diastolic HFmrEF,  EF: 45%.  Cardiomyopathy  NYHA functional class II, ACC Stage C  He presents with signs of hypervolemia with left  ventricular failure without signs of low flow state.  Admission NT pro BNP: 5896  Chest xray (single view) 07/25/2023:  Bilateral lower lung mixed pulmonary opacities, similar on the right and increased on the left. These are nonspecific though favored for pneumonia/aspiration. A component of atelectasis or scarring may also be present. Probable small left and possible trace right pleural effusions.     Admission Weight: 69.9 kg (154 lb)        Most recent weights (inpatient):   Vitals:    07/25/23 1441   Weight: 69.9 kg (154 lb)        I/O: net I/O for entire hospital stay: -600 mL, net for last 24 hrs -600 mL with UO 700 mL    Diuretic Therapy    Prior to admission dose    None - Lasix  20mg  PRN but has not taken   Given on admission    Lasix  86m IV x1 late 7/3   Daily Dosing   As needed  GDMT PTA Changes   BB Toprol  25mg  daily continue   ACEI/ARB/ARNI  No  Hypotension on Lisinopril      Aldosterone Antagonist  No Hypotension    SGLT2 Inhibitor  no    Hydralazine /Nitrate  No (N/A - patient not Black/African American)    Ivabradine No, hx atrial fib    HRMT  No (EF>35%)     Anticoagulation for Afib/flutter  Yes, Eliquis     Cardiac Rehab No, EF > 35%        Testing/Procedures   Echocardiogram 03/29/23    The left ventricular systolic function is mildly reduced. The visually estimated ejection fraction is 45%. There are segmental wall motion abnormalities, as described below. Abnormal septal motion.    Grade I (mild) left ventricular diastolic dysfunction. Elevated left atrial pressure.    The right ventricular size is normal. The right ventricular systolic function is mildly reduced. M-Mode TAPSE 1.2 cm (normal >1.7 cm).    Severe mitral annular calcification (mostly the posterior annulus), increased diastolic gradient, mild stenosis, MG = 5 mmHg, HR = 80 bpm, trace regurgitation.    There is a bioprosthetic  aortic valve present, # 29 S3 TAVR, MG ~ 5 mmHg, no regurgitation, overall normal function.    The pulmonary artery pressure could not be estimated due to inadequate tricuspid regurgitation signal.     Compared to the previous study dated 05/23/2022: LVEF = 45%, TAVR MG = 4 mmHg, no other significant valvular abnormalities.     Cardiac Catheterization 03/11/15: 30% pLAD, 40% mLAD, 50-60% mLcx, 30-40%pRCA, 30%mRCA followed by 30% distal        Paroxysmal Atrial Fibrillation  -daughter reports pt's HRs go up to 120s, sometimes at rest 2-3 times weekly  -on Eliquis  2.5mg  BID, Toprol     Hx TAVR (2017)  -echo in March showed normal function of TAVR valve    Labile Hypertension  Hx Orthostatic Hypotension  -BPs up to 190 in March so Lisinopril  2.5mg  BID added but then hypotensive after 1 dose Flomax in April 2025 so Lisinopril  stopped, Toprol  reduced from 25mg  BID to 12.5mg  daily    Right Kidney Stone with Hydronephrosis  - patient c/o right flank pain x 2 days  - wbc 15 k, scr 1.15, ua w/ packed rbc, 2-10 wbc  -CT ABD/PELV W CONTRAST 07/25/2023:  1.  3 mm right mid ureteral calculus, with minimal upstream hydroureteronephrosis.   2.  Additional nonobstructing bilateral renal calculi. No left hydronephrosis.   3.  Mild bladder wall thickening, which may be secondary to chronic outlet obstruction; though cystitis could appear similar. Correlation with urinalysis is recommended.   4.  Trace pleural effusions. Adjacent patchy opacities are likely atelectasis, though infection and/or aspiration could contribute.   5.  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   6.  Cholelithiasis.   - urology consulted- 7/3 - planning cystoscopy with RIGHT ureteral stent placement    CKD stage 3  Hx left renal artery stenosis with stenting (2003)  -left kidney is atrophic  -baseline Cr 1.1-1.4. Cr 1.15 on admit->1.40 today     Recurrent Pneumonia  Dysphagia  - recent pneumonia + Rhinovirus/enterovirus  - has known aspiration risk  - procalcitonin negative at 0.10  - continue doxy, zosyn   - SLP ordered    H/o CVA with Expressive Aphasia  Carotid Stenosis  -hx left CEA, carotid stent (2020)  -carotid duplex 2023 showed mild plaque, no significant stenosis bilateral CCAs, ICAs  _____________________________________________________________________________    History of  Present Illness: Javier Gutierrez is a 83 y.o. 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 Was hospitalized at [facility] between  February 2025 for hypertensive emergency and obstructive renal stone in the left atrophic kidney. Admitted in April for pseudomonal pneumonia and again in May 2025 for hypoxia, LLL pneumonia. He seemed to recover from this but has been noted by daughter to have some increased dyspnea past few days. Unclear if cough has worsened. Was admitted due to right flank pain, diagnosed with right ureteral stone. BNP elevated on admission. Chest xray, CT abd/pelvis shows small pleural effusions, ?pneumonia. Urology felt could not give copious fluids due to potential CHF and did not tolerate Flomax so recommended ureteral stent, to be placed this morning. Daughter has noted some mild abdominal bloating but no leg edema. He has slept in recliner chronically, not due to orthopnea. No CHF hospitalizations previously. He walks with a walker, goes from bedroom to other rooms. He denies chest pain, no bleeding tendencies other than some recent hematuria.     Review of Systems:  Constitution: Negative.   HEENT: Negative.    Cardiovascular: as in HPI, otherwise negative  Respiratory: as in HPI, otherwise negative.    Endocrine: Negative.    Hematologic/Lymphatic: Negative.    Skin: Negative.    Musculoskeletal: Negative.    Gastrointestinal: Negative.    Genitourinary: as in HPI   Neurological: Negative.    Psychiatric/Behavioral: Negative.    Allergic/Immunologic: Negative.      Past Medical History:    Acute on chronic systolic heart failure, NYHA class 2 (CMS-HCC)    Amyloidosis (CMS-HCC)    Aortic valve stenosis, mild    Arrhythmia    Arterial occlusion    Arthritis    Asthma    CAD (coronary artery disease)    Cardiomyopathy (CMS-HCC)    Carotid artery plaque    Cataract    CHF (congestive heart failure) (CMS-HCC)    Chronic kidney disease    Colon polyps    COPD (chronic obstructive pulmonary disease) (CMS-HCC)    Coronary atherosclerosis    Dizziness    Dyspnea    Frailty    H/O: CVA (cardiovascular accident)    History of renal artery stenosis    HTN    Hyperlipidemia    Hyperlipidemia    Hypertension    Hypothyroidism    Kidney disease    Kidney stones    Lung disease    Peripheral vascular disease    Tobacco abuse    Valvular heart disease     Surgical History:   Procedure Laterality Date    HX LUMBAR DISKECTOMY  01/22/1966    30 years ago    STENT INTRAVASCULAR  01/23/2004    kidney stent placed    Left Heart Catheterization With Ventriculogram Left 03/11/2015    Performed by Lannis Maude SAILOR, MD, [facility] at [facility] CATH LAB    Coronary Angiography N/A 03/11/2015    Performed by Lannis Maude SAILOR, MD, [facility] at [facility] CATH LAB    Possible Percutaneous Coronary Intervention N/A 03/11/2015    Performed by Lannis Maude SAILOR, MD, [facility] at [facility] CATH LAB    ESOPHAGOGASTRODUODENOSCOPY N/A 04/26/2015  Performed by Leonce Emerick PARAS, DO at [facility] ENDO ESOPHAGOGASTRODUODENOSCOPY BIOPSY  04/26/2015    Performed by Leonce Emerick PARAS, DO at [facility] ENDO    REPLACEMENT TRANSCATHETER AORTIC VALVE (Sapien 26s3), right common femoral artery approach N/A 08/03/2015    Performed by Tony Cordella FALCON, MD at [facility] CVOR    BRONCHOSCOPY Bilateral 11/30/2016    Performed by Shereen Rigg, MD at [facility] OR    BRONCHOSCOPY WITH BRONCHIAL ALVEOLAR LAVAGE - FLEXIBLE Right 11/30/2016    Performed by Shereen Rigg, MD at [facility] OR    ESOPHAGOGASTRODUODENOSCOPY for PEG placement N/A 12/06/2016    Performed by Buckles, Toribio BROCKS, MD at [facility] ENDO    ENDARTERECTOMY CAROTID ARTERY Left 02/17/2018    Performed by Idelia Alverna BROCKS, MD at [facility] OR    ANOMALOUS VENOUS RETURN REPAIR  2017    ECHOCARDIOGRAM PROCEDURE  05/2022    HX CATARACT REMOVAL  2018    HX HEART CATHETERIZATION  2015    KIDNEY STONE SURGERY  2025     Family History   Problem Relation Name Age of Onset    Stroke Mother Virginia  Eisenhuth     Other Mother Virginia  Scorza         colon cancer    Other Father          valve disease/COPD    Aortic Disease/Dissection Father      Cancer Mother Virginia  Bultema         Colon    Heart Disease Father Carrol Bondar      Social History     Socioeconomic History    Marital status: Widowed   Tobacco Use    Smoking status: Former     Current packs/day: 0.00     Average packs/day: 1.5 packs/day for 60.0 years (90.0 ttl pk-yrs)     Types: Cigarettes     Start date: 02/13/1958     Quit date: 02/13/2018     Years since quitting: 5.4    Smokeless tobacco: Never    Tobacco comments:     0.5-2 ppd history   Vaping Use    Vaping status: Never Used   Substance and Sexual Activity    Alcohol use: Not Currently    Drug use: Never    Sexual activity: Not Currently     Partners: Female        Objective:    Allergies:   Allergies   Allergen Reactions    Tamsulosin HYPOTENSION    Cefepime  SEE COMMENTS     suspect to be primary contributor to neurotoxicity        Medications:  Scheduled Meds:[Transfer Hold] acetaminophen  (TYLENOL  EXTRA STRENGTH) tablet 1,000 mg, 1,000 mg, Oral, ONCE  [Transfer Hold] albuterol -ipratropium (DUONEB) nebulizer solution 3 mL, 3 mL, Inhalation, TID & PRN  [Held by Provider] apixaban  (ELIQUIS ) tablet 2.5 mg, 2.5 mg, Oral, BID  [Transfer Hold] arformoteroL  (BROVANA ) nebulizer solution 15 mcg, 15 mcg, Inhalation, BID  [Transfer Hold] atorvastatin  (LIPITOR) tablet 20 mg, 20 mg, Oral, QDAY  [Transfer Hold] budesonide  (PULMICORT ) nebulizer solution 0.5 mg, 0.5 mg, Inhalation, BID  [Held by Provider] clopiDOGreL  (PLAVIX ) tablet 75 mg, 75 mg, Oral, QDAY  doxycycline  (VIBRAMYCIN ) 100 mg in sodium chloride  0.9% (NS) 100 mL IVPB (MB+), 100 mg, Intravenous, BID  [Transfer Hold] dutasteride  (AVODART ) capsule 0.5 mg, 0.5 mg, Oral, QDAY  [Transfer Hold] levothyroxine  (SYNTHROID ) tablet 88 mcg, 88 mcg, Oral, QDAY(07)  metoprolol  succinate XL (TOPROL  XL) tablet 12.5 mg, 12.5 mg, Oral, QDAY  Una  Hold] mirabegron  (MYRBETRIQ ) ER tablet 25 mg, 25 mg, Oral, QDAY  [Transfer Hold] montelukast  (SINGULAIR ) tablet 10 mg, 10 mg, Oral, QHS  [Transfer Hold] pantoprazole  DR (PROTONIX ) tablet 40 mg, 40 mg, Oral, QDAY  piperacillin /tazobactam (ZOSYN ) 4.5 g in sodium chloride  0.9% (NS) 100 mL IVPB (MB+), 4.5 g, Intravenous, Q6H*  [Transfer Hold] sodium chloride  0.9% IV bolus 1,000 mL, 1,000 mL, Intravenous, ONCE    Continuous Infusions:  PRN and Respiratory Meds:acetaminophen  Once PRN **OR** acetaminophen  (OFIRMEV ) IV Once PRN, [Transfer Hold] albuterol  sulfate PRN, diphenhydrAMINE  HCL Once PRN, fentaNYL  citrate PF Q5 MIN PRN, fentaNYL  citrate PF Q5 MIN PRN, HYDROmorphone  (DILAUDID ) injection Q10 MIN PRN, [Transfer Hold] melatonin QHS PRN, oxyCODONE  Q2H PRN, [Transfer Hold] polyethylene glycol 3350  QDAY PRN, [Transfer Hold] sennosides-docusate sodium  QDAY PRN    Medications Prior to Admission   Medication Sig Dispense Refill Last Dose/Taking    acetaminophen  (TYLENOL  EXTRA STRENGTH) 500 mg tablet Take one tablet by mouth every 6 hours as needed for Pain. Max of 4,000 mg of acetaminophen  in 24 hours.   07/24/2023    albuterol  0.083% (PROVENTIL ) 2.5 mg /3 mL (0.083 %) nebulizer solution Inhale 3 mL solution by nebulizer as directed twice daily as needed. (Patient not taking: Reported on 07/25/2023)   Not Taking    albuterol -ipratropium (DUONEB) 0.5 mg-3 mg(2.5 mg base)/3 mL nebulizer solution Inhale 3 mL solution by nebulizer as directed four times daily as needed for Wheezing.   07/24/2023    apixaban  (ELIQUIS ) 2.5 mg tablet TAKE 1 TABLET BY MOUTH TWO TIMES A DAY 180 tablet 3 07/25/2023    atorvastatin  (LIPITOR) 20 mg tablet Take one tablet by mouth daily. 90 tablet 3 07/24/2023    budesonide  (PULMICORT ) 0.5 mg/2 mL nebulizer solution Inhale 2 mL solution by nebulizer as directed twice daily.   07/24/2023    clopiDOGrel  (PLAVIX ) 75 mg tablet Take one tablet by mouth daily. 30 tablet 1 07/24/2023    dutasteride  (AVODART ) 0.5 mg PO capsule Take one capsule by mouth daily.   07/24/2023    food supplemt, lactose-reduced (ENSURE PLUS) 0.05 gram- 1.5 kcal/mL oral liquid Take 1 Can by mouth twice daily with meals.       formoterol fumarate (PERFOROMIST) 20 mcg/2 mL nebulizer vial Inhale 2 mL solution by nebulizer as directed twice daily.   07/24/2023    levoFLOXacin  (LEVAQUIN ) 500 mg tablet Take one tablet by mouth daily.   07/25/2023    levothyroxine  (SYNTHROID ) 88 mcg tablet Take one tablet by mouth daily 30 minutes before breakfast.   07/25/2023    metoprolol  succinate XL (TOPROL  XL) 25 mg extended release tablet Take one-half tablet by mouth daily. 45 tablet 3 07/24/2023    montelukast  (SINGULAIR ) 10 mg tablet Take one tablet by mouth at bedtime daily. 30 tablet 1 07/24/2023    MYRBETRIQ  25 mg tablet Take one tablet by mouth daily.   07/24/2023    other medication Prevagen Regular Strength - Take 1 capsule by mouth daily (Patient not taking: Reported on 07/25/2023)   Not Taking    pantoprazole  DR (PROTONIX ) 40 mg tablet Take one tablet by mouth daily. 30 tablet 1 07/24/2023 Vital Signs:  Last Filed                Vital Signs: 24 Hour Range   BP: 106/59 (07/04 0900)  Temp: 36.7 ?C (98 ?F) (07/04 0831)  Pulse: 92 (07/04 0900)  Respirations: 23 PER MINUTE (07/04 0900)  SpO2: 94 % (07/04 0900)  O2 Device: Nasal  cannula (07/04 0831)  O2 Liter Flow: 2 Lpm (07/04 0831)  BP: (106-183)/(55-98)   Temp:  [36.7 ?C (98 ?F)-37.3 ?C (99.1 ?F)]   Pulse:  [92-111]   Respirations:  [18 PER MINUTE-26 PER MINUTE]   SpO2:  [91 %-98 %]   O2 Device: Nasal cannula  O2 Liter Flow: 2 Lpm           Wt Readings from Last 10 Encounters:   07/25/23 69.9 kg (154 lb)   06/07/23 70 kg (154 lb 5.2 oz)   05/17/23 73.1 kg (161 lb 3.2 oz)   05/11/23 72.6 kg (160 lb)   04/11/23 72.9 kg (160 lb 12.8 oz)   03/29/23 72.6 kg (160 lb)   03/12/23 75.1 kg (165 lb 9.1 oz)   01/12/23 76.4 kg (168 lb 8 oz)   11/24/22 74.7 kg (164 lb 9.6 oz)   08/28/22 74.3 kg (163 lb 12.8 oz)     Physical Exam:  Gen: appears stated age, in no acute distress  Head: normocephalic, atraumatic  Eyes: sclera non-icteric, EOMs intact   Mouth: mucous membranes are moderately moist  Neck: very mild JVD with +HJR, no carotid bruits auscultated  Lungs: CTA bilaterally without rales or rhonchi  Heart: RRR with grade 1 systolic murmur, no gallop appreciated  Abdomen: soft, nontender, bowel sounds present  Extremities: no lower extremity edema, pedal pulses are intact. Feet are warm.  Skin: warm and dry  Neurological: A&Ox3, no focal deficits noted  Psychiatric: calm, pleasant, and cooperative      Laboratory Review:   CBC w/Diff    Lab Results   Component Value Date/Time    WBC 18.50 (H) 07/26/2023 05:20 AM    RBC 4.29 (L) 07/26/2023 05:20 AM    HGB 11.8 (L) 07/26/2023 05:20 AM    HCT 34.9 (L) 07/26/2023 05:20 AM    MCV 81.2 07/26/2023 05:20 AM    MCH 27.4 07/26/2023 05:20 AM    MCHC 33.8 07/26/2023 05:20 AM    RDW 18.0 (H) 07/26/2023 05:20 AM    PLTCT 240 07/26/2023 05:20 AM    MPV 8.6 07/26/2023 05:20 AM    Lab Results   Component Value Date/Time NEUT 94.3 (H) 07/26/2023 05:20 AM    ANC 17.50 (H) 07/26/2023 05:20 AM    LYMA 1.1 (L) 07/26/2023 05:20 AM    ALC 0.20 (L) 07/26/2023 05:20 AM    MONA 4.1 07/26/2023 05:20 AM    AMC 0.80 07/26/2023 05:20 AM    EOSA 0.1 07/26/2023 05:20 AM    AEC 0.00 07/26/2023 05:20 AM    BASA 0.4 07/26/2023 05:20 AM    ABC 0.10 07/26/2023 05:20 AM         Chemistry    Lab Results   Component Value Date/Time    NA 137 07/26/2023 05:20 AM    K 3.6 07/26/2023 05:20 AM    CL 101 07/26/2023 05:20 AM    CO2 25 07/26/2023 05:20 AM    GAP 11 07/26/2023 05:20 AM    BUN 15 07/26/2023 05:20 AM    CR 1.40 (H) 07/26/2023 05:20 AM    GLU 125 (H) 07/26/2023 05:20 AM    MG 1.8 06/08/2023 12:20 AM    BNP 95.0 06/29/2018 06:53 AM    Lab Results   Component Value Date/Time    CA 8.2 (L) 07/26/2023 05:20 AM    PO4 2.1 03/12/2023 05:49 AM    ALBUMIN  3.4 (L) 07/25/2023 03:51 PM    TOTPROT 7.2 07/25/2023 03:51  PM    ALKPHOS 55 07/25/2023 03:51 PM    AST 12 07/25/2023 03:51 PM    ALT 7 07/25/2023 03:51 PM    TOTBILI 0.8 07/25/2023 03:51 PM    GFR 50 (L) 07/26/2023 05:20 AM    GFRAA 48 (L) 06/29/2018 06:53 AM            Renal Function    Lab Results   Component Value Date/Time    NA 137 07/26/2023 05:20 AM    K 3.6 07/26/2023 05:20 AM    CL 101 07/26/2023 05:20 AM    CO2 25 07/26/2023 05:20 AM    GAP 11 07/26/2023 05:20 AM    BUN 15 07/26/2023 05:20 AM    BUN 14 07/25/2023 03:51 PM    BUN 17 06/17/2023 07:25 AM    Lab Results   Component Value Date/Time    CR 1.40 (H) 07/26/2023 05:20 AM    CR 1.15 07/25/2023 03:51 PM    CR 1.31 (H) 06/17/2023 07:25 AM    GLU 125 (H) 07/26/2023 05:20 AM    CA 8.2 (L) 07/26/2023 05:20 AM    PO4 2.1 03/12/2023 05:49 AM    ALBUMIN  3.4 (L) 07/25/2023 03:51 PM        Lipid Profile INR   Lab Results   Component Value Date    CHOL 120 04/04/2022    TRIG 107 04/04/2022    HDL 29 (L) 04/04/2022    LDL 70 04/04/2022    VLDL 21 04/04/2022    NONHDLCHOL 167 02/12/2018    CHOLHDLC 4 04/04/2022         Lab Results   Component Value Date    INR 2.5 (H) 07/25/2023          Telemetry: sinus rhythm with occasional PACs, rate 90s    ECG: sinus rhythm with RBBB, LAFB, unchanged    Echocardiogram Details:   Echo Results  (Last 3 results in the past 3 years)      Echo EF LVIDD LVIDS LA Size IVS LVPW Rest PAP    (03/29/23)   45    (03/29/23)   3.89    (03/29/23)   3.17    (05/12/21)   4.6    (03/29/23)   1.17    (03/29/23)   0.97    --    (05/23/22)   45    (05/23/22)   3.4    (05/23/22)   2.5    (09/21/20)   3.90    (05/23/22)   1.6    (05/23/22)   1.5        (05/12/21)   55    (05/12/21)   4.5    (05/12/21)   2.4     (05/12/21)   1.4    (05/12/21)   1.3

## 2023-07-26 NOTE — Consults
 Note to fulfill consult order - please see note from Dr. Pierpoline.

## 2023-07-26 NOTE — Progress Notes
 Daily Progress Note      Assessment/Plan     Javier Gutierrez is a 83 y.o. 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    Active Problem List  Right Hydronephrosis  - pt with right flank pain x 2 days  - wbc 15 k, scr 1.15, ua w/ packed rbc, 2-10 wbc  - CT A/P:   3 mm right mid ureteral calculus, with minimal upstream hydroureteronephrosis. 2.  Additional nonobstructing bilateral renal calculi. No left hydronephrosis. 3.  Mild bladder wall thickening, which may be secondary to chronic outlet obstruction; though cystitis could appear similar. Correlation with urinalysis is recommended. 4.  Trace pleural effusions. Adjacent patchy opacities are likely atelectasis, though infection and/or aspiration could contribute. 5.  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 6.  Cholelithiasis  - urology consulted and plan to take pt to OR for cystoscopy with RIGHT ureteral stent placement     SOB  Concern for Recurrent Pneumonia vs Other  - recent pneumonia + Rhinovirus/enterovirus  - he has known aspiration risk  -  procal normal  - continue doxy, and zosyn   - while on zosyn  - monitor plt level  - on his recent admission - there was a concern for cefepime -related neurotoxicity (myoclonus)  - monitor on tele     Moderate Dysphagia  - with known aspiration risk  - SLP ordered     Mild Cognitive Decline  H/o CVA with Expressive Aphasia  - pt is A&O x 2-3 on admission, has some difficulty with reporting the full date but he is A&O and appropriate answering questions  - ctm                      General Progress Note        Admission Date: 06/06/2023  LOS: 10 days                     Assessment/Plan:    Principal Problem:    Hypoxia  Active Problems:    Transient alteration of awareness     [name] is an 83 y/o M with PMH of known asthma/COPD, HFrEF, A-fib (on Eliquis ), HTN, PAD, h/o CVA with expressive aphasia, CKD3, who presents with progressive shortness of breath and hypoxemia. He was found to have LLL pneumonia that was felt to not be adequately treated previously. Pulmonary was consulted and pt was started on IV Zosyn . Pt's sputum culture had no growth. Due to prior sputum culture growing pseudomonas it was recommended that pt continue pseudomonal coverage, pt noted to have thrombocytopenia that was felt to be related to Zosyn . He was de-escalated to cefepime  + metronidazole . Of note, on 5/21, pt developed tremors which progressed to encephalopathy on 5/22. This was felt to be partly related to cefepime  so this was discontinued and pt was switched to levofloxacin . CT head wo contrast showed no intracranial bleed. Neurology was consulted for  further evaluation. Stat EEG did not show any epileptiform activity. Pt had improvement in mentation and therefore it was felt that this was due to cefepime .      Acute encephalopathy - improving  Myoclonus - concern for cefepime -related neurotoxicity  - 5/21 AM pt noted to have mild tremors  - 5/21 evening pt lethargic, not following commands, more tremors noted  - 5/22 morning pt aphasic, with some secretions, notable myoclonus in bilateral upper and lower extremities, lethargic  - stat CT head wo contrast (5/22): no evidence of intracranial bleed  - received IV Keppra  x 1 (5/22)  - stat EEG (5/22): no epileptiform activity  Plan:   - SLP re-consulted and evaluated at bedside on 5/23, okay to resume dysphagia diet (was on this previously)  - neurology has signed off  - Added cefepime  to drug intolerance given high suspicion for being culprit to neurotoxicity     Left lower lobe pneumonia  RLL mucus plugging 2/2 aspiration  + Rhinovirus/enterovirus  Chronic bronchiectasis  Moderate dysphagia  - recent diagnosis of pseudomonal pneumonia in April 2025  - patient recently admitted at [facility] for pneumonia and treated with zosyn  followed by a week long course of Levaquin  through 5/12, daughter reports breathing again worsened a few days prior to arrival   - CTA chest on admission with LLL consolidation and development of bibasilar nodular opacities consistent with multifocal pneumonia and cental and peripheral bronchiectasis  - reports history of aspiration  - MRSA nares negative, legionella Ag negative, histoplasma Ag negative  - RVP + rhinovirus/enterovirus  - evaluated by SLP on 5/16 with concerns for moderate dysphagia  - sputum culture (5/17): GS with few gram positive cocci in singles and/or pairs, rare budding yeast  - Zosyn  (5/15 - 5/19)  - cefepime  (5/19 - 5/21)  Plan:   - pulmonary has signed off, plan for outpatient TeleHealth f/u with pulm on 5/29  - Completed IV levofloxacin  750mg  Q48H x 2 doses (5/22 and 5/24)  - Continue PT budesonide , formoterolMucinex BID, DuoNebs Q4H and PRN, sodium chloride  nebulizer BID  - appreciate RT help with suctioning   - follow blood cultures (5/15)     Acute moderate thrombocytopenia, possibly related to abx  - plt dropping this admission, down to 79 (5/21)  - felt to be due to Zosyn , discontinued as above, however has been low since day of admission even prior to initiation of Zosyn  (5/15)  - discussed with daughter who reported pt did receive about 4 days of Zosyn  at outside hospital  - pt on Eliquis  and has not been using heparin  products  - INR just slightly elevated at 1.6  - peripheral smear (5/20): no qualitative morphologic abnormalities of diagnostic significant   Plan:   - Zosyn  discontinued  - monitor CBC     History of asthma vs COPD with recent exacerbation  - patient recently treated for COPD exacerbation and had been on steroid taper  - continue PTA budesonide  and albuterol  PRN  - continue PTA montelukast   - completed steroid taper on 5/19  - has outpatient pulmonary establish care appt arranged with plan for outpatient PFTs in August 2025     Hypertension, uncontrolled  Chronic systolic heart failure  PAD s/p stent  CVA with expressive aphasia  Atrial fibrillation  - TTE 3/7: LVEF 45%. Segmental WMA. Mild LV diastolic dysfunction. Trace MR.  - BNP elevated on admission to 2297, patient appears euvolemic on exam and is at his dry weight of 164lbs  -  hs troponin mildly elevated in ED 36->40->43, patient denies any chest pain  - EKG in ED with sinus tach and bifascicular block, noted on prior EKGs  Plan:   - as outpatient can discuss with cardiologist regarding increase in dose of apixaban  (to 5mg  BID rather than 2.5mg  BID) as currently no clear indication for reduced dose  - cont PTA statin, Plavix   - cont PTA metoprolol  succinate 12.5mg  daily > up-titrated to 25mg  daily beginning 5/18  - per pt's daughter pt has had labile blood pressures, especially one time after taking Flomax he had to be taken off of all anti-hypertensive meds and is now just beginning to titrate up  - Hold pta lisinopril  2.5mg  BID based off of outpatient cardiology notes  - takes furosemide  20mg  daily PRN     AKI on CKD3     Assessment & Plan  Abdominal pain    Cardiomyopathy (CMS-HCC)    History of renal artery stenosis    Hypertension    Stage 3 chronic kidney disease (CMS-HCC)    Orthostatic hypotension    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  Code Status: Full Code  Payor: MEDICARE / Plan: MEDICARE PART A AND B / Product Type: Medicare /     Dispo: Continue admission for pneumonia treatment      Willo Phenes, MD FACP  Internal Medicine    * Please Voalte message Med Private G, First Call to be connected with the covering physician 24/7.     Personal Voaltes and pagers are not answered at all hours.    Subjective   This is a 83 y.o. male admitted for Abdominal pain    Interim events:   S/p urologic procedure on 7/4    Subjective:  Patient  Interviewed.  Patient is doing well following the procedure.  Having hematuria from Foley catheter.  Has no fevers or chills at present.      ROS: Positive for hematuria.    Objective     24-Hour Vitals Range  Vital Signs                  Vital Signs:  Last Filed                   Vital Signs: 24 Hour Range   BP: 152/75 (07/04 1145)  Temp: 36.8 ?C (98.2 ?F) (07/04 1212)  Pulse: 91 (07/04 1145)  Respirations: 22 PER MINUTE (07/04 1212)  SpO2: 96 % (07/04 1212)  O2 Device: Nasal cannula (07/04 1212)  O2 Liter Flow: 2 Lpm (07/04 1212)  BP: (106-183)/(55-98)   Temp:  [36.2 ?C (97.1 ?F)-37.3 ?C (99.1 ?F)]   Pulse:  [89-111]   Respirations:  [18 PER MINUTE-26 PER MINUTE]   SpO2:  [91 %-98 %]   O2 Device: Nasal cannula  O2 Liter Flow: 2 Lpm    Intensity Pain Scale (Self Report): (not recorded)      BP Readings from Last 6 Encounters:   07/26/23 (!) 152/75   06/17/23 138/66   05/17/23 (!) 176/101   05/12/23 (!) 141/71   04/25/23 129/73   04/11/23 (!) 144/76     Wt Readings from Last 3 Encounters:   07/25/23 69.9 kg (154 lb)   06/07/23 70 kg (154 lb 5.2 oz)  05/17/23 73.1 kg (161 lb 3.2 oz)       Intake/Output Summary: (Last 24 hours)    Intake/Output Summary (Last 24 hours) at 07/26/2023 1329  Last data filed at 07/26/2023 1102  Gross per 24 hour   Intake 400 ml   Output 1350 ml   Net -950 ml           PHYSICAL EXAMINATION.   General: in bed, NAD. A&Ox4 with appropriate affect and conversation.   Heent:  Conjunctivae pink. No nuchal rigidity. Oral mucosa pink/moist without lesions or sores.   Neck: Neck soft, supple, no masses or lymphadenopathy felt. No JVD.  Cardiovascular: Normal rate, and regular without audible murmur, rub, click or gallop. Pulses 2/4 all extremities.   Respiratory: lungs CTAB without crackles, wheezes or rhonchi.   Abdomen: soft, non-tender, non-distended. Normal bowel sounds throughout.   GU: foley with hematuria  Extremities: 2+ pulses in all extremities.   Neurological: Pupils equal round and reactive.       Malnutrition Details:                                                       Active Wounds  Active Wounds          Wounds Pressure injury Coccyx (Active)   07/25/23 2120   Wound Type: Pressure injury   Orientation:    Location: Coccyx   Wound Location Comments:    Initial Wound Site Closure:    Initial Dressing Placed:    Initial Cycle:    Initial Suction Setting (mmHg):    Pressure Injury Stages: Stage 1   Pressure Injury Present Within 24 Hours of Hospital Admission: Yes   If This Pressure Injury Is Suspected to Be Device Related, Please Select the Device::    Is the Wound Open or Closed:    Wound Assessment Pink 07/25/23 2120   Peri-wound Assessment Dry 07/25/23 2120   Wound Drainage Amount None 07/25/23 2120   Wound Dressing Status None/open to air 07/25/23 2120   Wound Care Treatment or ointment applied 07/25/23 2120   Wound Dressing and/or Treatment Barrier cream 07/25/23 2120   Number of days: 1                                                Medications    Scheduled Meds:albuterol -ipratropium (DUONEB) nebulizer solution 3 mL, 3 mL, Inhalation, TID & PRN  [Held by Provider] apixaban  (ELIQUIS ) tablet 2.5 mg, 2.5 mg, Oral, BID  arformoteroL  (BROVANA ) nebulizer solution 15 mcg, 15 mcg, Inhalation, BID  atorvastatin  (LIPITOR) tablet 20 mg, 20 mg, Oral, QDAY  budesonide  (PULMICORT ) nebulizer solution 0.5 mg, 0.5 mg, Inhalation, BID  [Held by Provider] clopiDOGreL  (PLAVIX ) tablet 75 mg, 75 mg, Oral, QDAY  doxycycline  (VIBRAMYCIN ) 100 mg in sodium chloride  0.9% (NS) 100 mL IVPB (MB+), 100 mg, Intravenous, BID  dutasteride  (AVODART ) capsule 0.5 mg, 0.5 mg, Oral, QDAY  levothyroxine  (SYNTHROID ) tablet 88 mcg, 88 mcg, Oral, QDAY(07)  metoprolol  succinate XL (TOPROL  XL) tablet 12.5 mg, 12.5 mg, Oral, QDAY  mirabegron  (MYRBETRIQ ) ER tablet 25 mg, 25 mg, Oral, QDAY  montelukast  (SINGULAIR ) tablet 10 mg, 10 mg, Oral, QHS  pantoprazole  DR (PROTONIX ) tablet 40  mg, 40 mg, Oral, QDAY  piperacillin /tazobactam (ZOSYN ) 4.5 g in sodium chloride  0.9% (NS) 100 mL IVPB (MB+), 4.5 g, Intravenous, Q6H*    Continuous Infusions:  PRN and Respiratory Meds:albuterol  sulfate PRN, hyoscyamine  Q4H PRN, melatonin QHS PRN, polyethylene glycol 3350  QDAY PRN, sennosides-docusate sodium  QDAY PRN    albuterol  sulfate PRN, hyoscyamine  Q4H PRN, melatonin QHS PRN, polyethylene glycol 3350  QDAY PRN, sennosides-docusate sodium  QDAY PRN    Lab Review  Recent Labs     07/25/23  1551 07/26/23  0520   HGB 12.4* 11.8*   WBC 15.90* 18.50*       24-hour labs:    Results for orders placed or performed during the hospital encounter of 07/25/23 (from the past 24 hours)   CBC AND DIFF    Collection Time: 07/25/23  3:51 PM   Result Value Ref Range    White Blood Cells 15.90 (H) 4.50 - 11.00 10*3/uL    Red Blood Cells 4.54 4.40 - 5.50 10*6/uL    Hemoglobin 12.4 (L) 13.5 - 16.5 g/dL    Hematocrit 62.8 (L) 40.0 - 50.0 %    MCV 81.6 80.0 - 100.0 fL    MCH 27.3 26.0 - 34.0 pg    MCHC 33.4 32.0 - 36.0 g/dL    RDW 81.4 (H) 88.9 - 15.0 %    Platelet Count 250 150 - 400 10*3/uL    MPV 9.0 7.0 - 11.0 fL    Neutrophils 79.7 (H) 41.0 - 77.0 %    Lymphocytes 6.7 (L) 24.0 - 44.0 %    Monocytes 13.1 (H) 4.0 - 12.0 %    Eosinophils 0.1 0.0 - 5.0 %    Basophils 0.4 0.0 - 2.0 %    Absolute Neutrophil Count 12.60 (H) 1.80 - 7.00 10*3/uL    Absolute Lymph Count 1.10 1.00 - 4.80 10*3/uL    Absolute Monocyte Count 2.10 (H) 0.00 - 0.80 10*3/uL    Absolute Eosinophil Count 0.00 0.00 - 0.45 10*3/uL    Absolute Basophil Count 0.10 0.00 - 0.20 10*3/uL    MDW (Monocyte Distribution Width) 22.6 (H) <=20.6   COMPREHENSIVE METABOLIC PANEL    Collection Time: 07/25/23  3:51 PM   Result Value Ref Range    Sodium 134 (L) 137 - 147 mmol/L    Potassium 4.0 3.5 - 5.1 mmol/L    Chloride 98 98 - 110 mmol/L    Glucose 103 (H) 70 - 100 mg/dL    Blood Urea Nitrogen 14 7 - 25 mg/dL    Creatinine 8.84 9.59 - 1.24 mg/dL    Calcium 8.7 8.5 - 89.3 mg/dL    Total Protein 7.2 6.0 - 8.0 g/dL    Total Bilirubin 0.8 0.2 - 1.3 mg/dL    Albumin  3.4 (L) 3.5 - 5.0 g/dL    Alk Phosphatase 55 25 - 110 U/L    AST 12 7 - 40 U/L    ALT 7 7 - 56 U/L    CO2 26 21 - 30 mmol/L    Anion Gap 10 3 - 12    Glomerular Filtration Rate (GFR) >60 >60 mL/min   LIPASE    Collection Time: 07/25/23  3:51 PM   Result Value Ref Range    Lipase 16 11 - 82 U/L   PROCALCITONIN    Collection Time: 07/25/23  3:51 PM   Result Value Ref Range    Procalcitonin 0.10 ng/mL   NT-PRO-BNP    Collection Time: 07/25/23  3:51 PM   Result  Value Ref Range    NT-Pro-BNP 5,896 (H) <450 pg/mL   HIGH SENSITIVITY TROPONIN I, RANDOM    Collection Time: 07/25/23  3:51 PM   Result Value Ref Range    hs Troponin I, Random 50 (H) <20 ng/L   URINALYSIS DIPSTICK REFLEX TO CULTURE    Collection Time: 07/25/23  5:08 PM    Specimen: Midstream; Urine   Result Value Ref Range    Color,UA Yellow     Turbidity,UA 1+ (A) Clear    Specific Gravity-Urine 1.017 1.005 - 1.030    pH,UA 6.0 5.0 - 8.0    Protein,UA 2+ (A) Negative    Glucose,UA Negative Negative    Ketones,UA Negative Negative    Bilirubin,UA Negative Negative    Blood,UA 3+ (A) Negative    Urobilinogen,UA Increased (A) Normal    Nitrite,UA Negative Negative    Leukocytes,UA Trace (A) Negative   URINALYSIS MICROSCOPIC REFLEX TO CULTURE    Collection Time: 07/25/23  5:08 PM    Specimen: Midstream; Urine   Result Value Ref Range    WBCs,UA 2 - 10 (A) None, 0 - 2  /HPF    RBCs,UA Packed (A) None, 0 - 2  /HPF    Mucous,UA Trace None, Trace /LPF   PROTIME INR (PT)    Collection Time: 07/25/23  5:31 PM   Result Value Ref Range    Protime 28.0 (H) 9.9 - 14.2 Seconds INR 2.5 (H) 0.9 - 1.2   PTT (APTT)    Collection Time: 07/25/23  5:31 PM   Result Value Ref Range    APTT 30.3 24.0 - 36.5 Seconds   CULTURE - RESPIRATORY    Collection Time: 07/25/23  6:41 PM    Specimen: Lung; Sputum   Result Value Ref Range    Respiratory Culture Culture in progress     Gram Stain 10-25/LPF Neutrophils     Gram Stain Less than 10/LPF Squamous Epithelial Cells     Gram Stain Few Mixed bacteria    MRSA BY PCR (NASAL)    Collection Time: 07/25/23  6:41 PM    Specimen: Nasal; Swab   Result Value Ref Range    MRSA Pneumonia Screen Not Detected Not Detected, Test Invalid   CBC AND DIFF    Collection Time: 07/26/23  5:20 AM   Result Value Ref Range    White Blood Cells 18.50 (H) 4.50 - 11.00 10*3/uL    Red Blood Cells 4.29 (L) 4.40 - 5.50 10*6/uL    Hemoglobin 11.8 (L) 13.5 - 16.5 g/dL    Hematocrit 65.0 (L) 40.0 - 50.0 %    MCV 81.2 80.0 - 100.0 fL    MCH 27.4 26.0 - 34.0 pg    MCHC 33.8 32.0 - 36.0 g/dL    RDW 81.9 (H) 88.9 - 15.0 %    Platelet Count 240 150 - 400 10*3/uL    MPV 8.6 7.0 - 11.0 fL    Neutrophils 94.3 (H) 41.0 - 77.0 %    Lymphocytes 1.1 (L) 24.0 - 44.0 %    Monocytes 4.1 4.0 - 12.0 %    Eosinophils 0.1 0.0 - 5.0 %    Basophils 0.4 0.0 - 2.0 %    Absolute Neutrophil Count 17.50 (H) 1.80 - 7.00 10*3/uL    Absolute Lymph Count 0.20 (L) 1.00 - 4.80 10*3/uL    Absolute Monocyte Count 0.80 0.00 - 0.80 10*3/uL    Absolute Eosinophil Count 0.00 0.00 - 0.45 10*3/uL    Absolute  Basophil Count 0.10 0.00 - 0.20 10*3/uL   BASIC METABOLIC PANEL    Collection Time: 07/26/23  5:20 AM   Result Value Ref Range    Sodium 137 137 - 147 mmol/L    Potassium 3.6 3.5 - 5.1 mmol/L    Chloride 101 98 - 110 mmol/L    Glucose 125 (H) 70 - 100 mg/dL    Blood Urea Nitrogen 15 7 - 25 mg/dL    Creatinine 8.59 (H) 0.40 - 1.24 mg/dL    Calcium 8.2 (L) 8.5 - 10.6 mg/dL    CO2 25 21 - 30 mmol/L    Anion Gap 11 3 - 12    Glomerular Filtration Rate (GFR) 50 (L) >60 mL/min            Microbiology - Resulted Micro Last 72 Hrs        CULTURE - RESPIRATORY  Resulted: 07/26/23 1153, Result status: Preliminary result     Ordering provider: Idell Cuff, MD  07/25/23 1802 Resulting lab: [place] [facility]   CLIA number: [id number]      Specimen Information      Source Collected On   Lung 07/25/23 1841              Components      Component Value Flag   Respiratory Culture Culture in progress  --   Gram Stain 10-25/LPF Neutrophils  --   Gram Stain Less than 10/LPF Squamous Epithelial Cells  --   Gram Stain Few Mixed bacteria  --                  MRSA BY PCR (NASAL) (Normal)  Resulted: 07/25/23 2057, Result status: Final result     Ordering provider: Pete Bernardino CROME, MD  07/25/23 1824 Resulting lab: [place] [facility]   CLIA number: [id number]      Specimen Information      Source Collected On   Nasal 07/25/23 1841              Components      Component Value Flag   MRSA Pneumonia Screen Not Detected  --                    Pertinent labs reviewed    Radiology and other Diagnostics Review:    Pertinent studies reviewed.    CT ABD/PELV W CONTRAST  Result Date: 07/25/2023  1.  3 mm right mid ureteral calculus, with minimal upstream hydroureteronephrosis. 2.  Additional nonobstructing bilateral renal calculi. No left hydronephrosis. 3.  Mild bladder wall thickening, which may be secondary to chronic outlet obstruction; though cystitis could appear similar. Correlation with urinalysis is recommended. 4.  Trace pleural effusions. Adjacent patchy opacities are likely atelectasis, though infection and/or aspiration could contribute. 5.  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 6.  Cholelithiasis. By my electronic signature, I attest that I have personally reviewed the images for this examination and formulated the interpretations and opinions expressed in this report  Finalized by Fairy DOROTHA Hockey, MD on 07/25/2023 5:41 PM. Dictated by Dorthea Court, MD on 07/25/2023 5:16 PM.    CHEST SINGLE VIEW  Result Date: 07/25/2023  Bilateral lower lung mixed pulmonary opacities, similar on the right and increased on the left. These are nonspecific though favored for pneumonia/aspiration. A component of atelectasis or scarring may also be present. Probable small left and possible trace right pleural effusions.  Finalized by Norleen  Leana, M.D. on 07/25/2023 5:31 PM. Dictated by Norleen Leana, M.D. on 07/25/2023 5:28 PM.

## 2023-07-27 ENCOUNTER — Encounter: Admit: 2023-07-27 | Discharge: 2023-07-27 | Payer: MEDICARE

## 2023-07-27 ENCOUNTER — Observation Stay: Admit: 2023-07-27 | Discharge: 2023-07-27 | Payer: MEDICARE

## 2023-07-27 LAB — CBC AND DIFF
~~LOC~~ BKR ABSOLUTE BASO COUNT: 0 10*3/uL (ref 0.00–0.20)
~~LOC~~ BKR ABSOLUTE EOS COUNT: 0 10*3/uL (ref 0.00–0.45)
~~LOC~~ BKR ABSOLUTE MONO COUNT: 0.7 10*3/uL (ref 0.00–0.80)
~~LOC~~ BKR BASOPHILS %: 0.1 % (ref 0.0–2.0)
~~LOC~~ BKR EOSINOPHILS %: 0.4 % (ref 0.0–5.0)
~~LOC~~ BKR LYMPHOCYTES %: 2.7 % — ABNORMAL LOW (ref 24.0–44.0)
~~LOC~~ BKR MCHC: 32 g/dL — ABNORMAL HIGH (ref 32.0–36.0)
~~LOC~~ BKR MCV: 84 fL — ABNORMAL HIGH (ref 80.0–100.0)
~~LOC~~ BKR MONOCYTES %: 5.8 % (ref 4.0–12.0)
~~LOC~~ BKR MPV: 8.4 fL (ref 7.0–11.0)
~~LOC~~ BKR NEUTROPHILS %: 91 % — ABNORMAL HIGH (ref 41.0–77.0)
~~LOC~~ BKR PLATELET COUNT: 235 10*3/uL — ABNORMAL LOW (ref 150–400)
~~LOC~~ BKR RBC COUNT: 3.6 10*6/uL — ABNORMAL LOW (ref 4.40–5.50)
~~LOC~~ BKR RDW: 18 % — ABNORMAL HIGH (ref 11.0–15.0)
~~LOC~~ BKR WBC COUNT: 12 10*3/uL — ABNORMAL HIGH (ref 4.50–11.00)

## 2023-07-27 LAB — COMPREHENSIVE METABOLIC PANEL
~~LOC~~ BKR ANION GAP: 13 10*3/uL — ABNORMAL HIGH (ref 3–12)
~~LOC~~ BKR BLD UREA NITROGEN: 30 mg/dL — ABNORMAL HIGH (ref 7–25)
~~LOC~~ BKR CHLORIDE: 106 mmol/L — ABNORMAL LOW (ref 98–110)
~~LOC~~ BKR GLOMERULAR FILTRATION RATE (GFR): 42 mL/min — ABNORMAL LOW (ref >60–4.80)
~~LOC~~ BKR POTASSIUM: 3.5 mmol/L — ABNORMAL LOW (ref 3.5–5.1)

## 2023-07-27 LAB — POC GLUCOSE: ~~LOC~~ BKR POC GLUCOSE: 131 mg/dL — ABNORMAL HIGH (ref 70–100)

## 2023-07-27 MED ORDER — PIPERACILLIN/TAZOBACTAM 4.5 G/100ML NS IVPB (MB+)
4.5 g | INTRAVENOUS | 0 refills | Status: DC
Start: 2023-07-27 — End: 2023-07-30
  Administered 2023-07-28 – 2023-07-30 (×16): 4.5 g via INTRAVENOUS

## 2023-07-27 MED ORDER — SODIUM CHLORIDE 3 % IN NEBU
4 mL | Freq: Two times a day (BID) | RESPIRATORY_TRACT | 0 refills | Status: DC
Start: 2023-07-27 — End: 2023-08-01
  Administered 2023-07-27 – 2023-08-01 (×10): 4 mL via RESPIRATORY_TRACT

## 2023-07-27 NOTE — Progress Notes
 SPEECH-LANGUAGE PATHOLOGY  CLINICAL SWALLOW ASSESSMENT     Name: Javier Gutierrez   MRN: [id number]     DOB: Jan 22, 1941    Age: 83 y.o.  Admission Date: 07/25/2023     LOS: 0 days     Date of Service: 07/27/2023    Evaluation Summary   Clinical swallow evaluation completed. Pt w/ increased alertness from previous date and w/ meaningful participation throughout assessment. Pt familiar to this department throughout various hospitalizations w/ this department assisting w/ dysphagia management since 2018. Anticipate oropharyngeal function this date is at baseline w/ videoswallow study completed 06/07/23. However, ? Esophageal component given pt/family report and clinical symptoms experienced this date (pt appeared to burp/reflux followed by cough, globus, intermittent burping/gurgle sound). Daughter at bedside reports additional symptoms of esophageal dysphagia over the last 6 weeks (gurgle following swallow, burping, globus). Given report of esophageal symptoms and observed this date, anticipate the pt could benefit from a videoswallow evaluation w/ A/P assessment to differentiate baseline known moderate oropharyngeal dysphagia from esophageal findings. Communicated w/ daughter, Javier Gutierrez, and pt who are in agreeable to this plan. Discussed w/ primary medical team Dr. Danell. This department will continue to follow up. Discussed pending results of videoswallow study, the pt may benefit from a palliative care consultation, which pt/daughter are receptive to.  Clinical Impression: Moderate dysphagia  Sources of Dysphagia: Baseline dysphagia c/w Weakness from debility, COPD, Presbyphagia, history of CVA    Swallow Recommendations  PO: Pureed, Moderately thick liquids w/ known potential for aspiration  Medications: As tolerated  Supervision: 1:1(family OK to supervise  Positioning: Upright as tolerated  Swallow Strategies: Slow rate of intake, Small bites/sips, Multiple swallows, Alternate liquids/solids, 100% supervision to provide cues  for swallow strategies during meals  Oral Hygiene: Complete oral care to minimize the risk of aspirating oral bacteria  Plan to complete videoswallow to differentiate baseline oropharyngeal dysphagia vs. Esophageal dysphagia findings    PO Presentation  Presentations: Therapist Fed, Patient Fed Self  Moderately Thick Liquid: Cup  Other Consistencies: Puree    Clinical Interpretation of Oral Stage  Withdraw Bolus: WFL  Form Bolus: Slowed  Masticate Bolus: Did not trial masticated solids due to safety concerns (Comment)  Transfer Bolus: Slowed  Anterior Bolus Spillage: None  Oral Residue: None    Clinical Interpretation of Pharyngeal Stage  Swallow Initiation:  (present)  Laryngeal Elevation:  (palpable/present)  Signs / Symptoms Of Aspiration: intermittent cough across trials difficult to differentiate w/ single consistency- given previous VSS anticipate related to pharyngeal residues    Oral Mech Exam  Oral Mech WFL: No  Lips: WFL  Tongue: WFL  Buccal: WFL  Velopharynx: Impaired ROM - L  Vocal Quality:  (appeared overall weak)  Volitional Cough: Weak (however productive)    Relevant Dysphagia History  Pt w/ extensive dysphagia history w/ this department, see chart for all of visit notes. Most recent videoswallow evaluation completed 06/06/2023 with the following findings:    EVALUATION SUMMARY  Videofluoroscopic swallow evaluation completed. Education re: video swallow test discussed with pt and pt's daughter. Pt has elected for option 2 below at this time. Department will continue to f/u.   Overall Impression: Moderate dysphagia   Oral Phase: Decreased bolus control, slow lingual movement, mild oral residue   Penetration Events: Thin and mildly thick liquids  Aspiration Events: Thin and mildly thick liquids before and during-intermittently silent in nature   Pharyngeal Residue: Mild-moderate   Strategies: Chin tuck: not effective   Sources of Dysphagia: Weakness  from debility, COPD, Presbyphagia, Baseline Dysphagia (history of CVA)    Subjective  Reason for Admission and Past Medical Hx: Patient is an 83 year old male with a history of COPD, heart failure with reduced ejection fraction, A-fib on anticoagulation, PAD, history of CVA with expressive aphasia, 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 Similar multifocal central and peripheral bronchiectasis, likely sequela of chronic infection or aspiration. Small pleural effusions. By my electronic signature, I attest that I have personally reviewed the images for this examination and formulated the interpretations and opinions expressed in this report  Finalized by Eleanor Don, M.D. on 07/27/2023 2:17 PM. Dictated by Mont Lay, MD on 07/27/2023 1:51 PM.    CHEST SINGLE VIEW  Result Date: 07/25/2023  Bilateral lower lung mixed pulmonary opacities, similar on the right and increased on the left. These are nonspecific though favored for pneumonia/aspiration. A component of atelectasis or scarring may also be present. Probable small left and possible trace right pleural effusions.  Finalized by Norleen Pane, M.D. on 07/25/2023 5:31 PM. Dictated by Norleen Pane, M.D. on 07/25/2023 5:28 PM.    Nutrition  Nutrition Prior To Hospitalization: Oral, Minced and moist, Moderately Thick Liquids (w/ potential for aspiration)  Current Form Of Nutrition: Pureed, Moderately Thick Liquids (w/ potential for aspiration)    Education  Persons Educated: Pt/Family  Barriers To Learning: None Noted  Teaching Methods: Verbal  Topics: Dysphagia  Patient Response: Verbalized Understanding  Goal Formulation: With Patient    Assessment/Prognosis  Plan: 1-2 x/week  Prognosis: Guarded, Do not anticipate patient to improve during acute hospitalization  NOMS Dysphagia Rating: 3-Moderate Dysphagia -Alternative method of feeding needed. Pt takes < 50% of nutrition/hydration by mouth &/or swallow safe w/ consistent mod cueing to use compensatory strategies &/or requires max diet restriction.    Clinical Swallow Goals  Goal : The pt will participate in a videoswallow evaluation given <5 cues for participation    Therapist:Manpreet Strey Almond, MA, CF-SLP  Date:07/27/2023

## 2023-07-27 NOTE — Procedures
 Vascular Access Team consulted to obtain lab specimen.    Ultrasound Used: Yes    How Many Attempts: 1    Location of Unsuccessful Attempts: na    At 1040 approximately 9 ml of blood obtained from rac. Patient tolerated the procedure well. Specimen labeled and sent to lab.

## 2023-07-27 NOTE — Care Plan
 Problem: Discharge Planning  Goal: Participation in plan of care  Outcome: Goal Ongoing  Goal: Knowledge regarding plan of care  Outcome: Goal Ongoing  Goal: Prepared for discharge  Outcome: Goal Ongoing     Problem: Skin Integrity  Goal: Skin integrity intact  Outcome: Goal Ongoing  Goal: Healing of skin (Wound & Incision)  Outcome: Goal Ongoing  Goal: Healing of skin (Pressure Injury)  Outcome: Goal Ongoing     Problem: High Fall Risk  Goal: High Fall Risk  Outcome: Goal Ongoing     Problem: Glucose Management  Goal: Absence of hyperglycemia  Outcome: Goal Ongoing  Goal: Absence of Hypoglycemia  Outcome: Goal Ongoing  Goal: Glucose level within specified parameters  Outcome: Goal Ongoing

## 2023-07-27 NOTE — Progress Notes
 SPEECH-LANGUAGE PATHOLOGY  NOTE     Attempted to follow up for completion of clinical swallow assessment. At SLP arrival, pt getting ready for transportation to CT scan. This department will continue to follow up for completion.    Therapist: Viktoria Sauger, MA, CF-SLP  Date: 07/27/2023

## 2023-07-27 NOTE — Progress Notes
 [facility] END OF SHIFT NOTE    Nursing Shift: Night Shift 1900-0700    Admission Date: 07/25/2023    Acute events, interventions, and provider communication:     Patient Goal(s)  Patient will improve nutritional intake  by the end of next shift.        Patient will improve kidney stones by discharge.   Heart Failure Shift Summary    Admission Weight: Weight: 69.9 kg (154 lb)    Last 3 Weights:   Vitals:    07/25/23 1441   Weight: 69.9 kg (154 lb)     Weight Change: Weight trend stable    Intake/Output Summary (Last 24 hours) at 07/27/2023 0743  Last data filed at 07/27/2023 0455  Gross per 24 hour   Intake 400 ml   Output 910 ml   Net -510 ml     Last Bowel Movement Date:  (PTA)    Fluid Restriction? No   Quality/Safety    Total Fall Risk Score: 14   Risk for Injury related to falls: Bones susceptible to fracture and Surgery/procedure within the last 24 hours  Fall Risk Category:   History of More Than One Fall Within 6 Months Before Admission: No  Elimination, Bowel and Urine: Incontinence OR urgency OR frequency  Interventions: Bladder management device utilized (Purewick, Quivy, urinal) and Elimination schedule  Medications: On 2 or more high fall risk drugs  Interventions: Educate patient on medication side effects, Increased safety rounding frequency, Bed/chair alarm in place and turned on (impulsive, forgetful, etc.), Remain within arm's reach while ambulating (orthostasis, dizziness), Remain within arm's reach while toileting (orthostasis, dizziness), Bedside commode readily available at the bedside (urgency, frequency, dizziness), and Gait belt utilized while ambulating  Patient Care Equipment: One present  Interventions: Ensure environment is free of clutter and tripping hazards, Patient requires assistance while ambulating with equipment, and Remove unused patient care equipment   Mobility: 2 - Assistance required  Interventions: Patient educated over how their mobility status places them at risk for falls, Requires additional staff to assist with mobilizing, Gait belt required while mobilizing , Patient requires staff within arm's reach while ambulating, Patient requires staff within arm's reach while toileting, and PT/OT consulted (weakness, unsteady gait, etc.)  Cognition: 1 - Altered awareness of immediate physical environment  Interventions: Bed and chair alarms in place (REQUIRED), Staff remains within arm's reach while ambulating AND toileting (REQUIRED), Patient requires lap belt in place while sitting upright and is able to demonstrate their own release, and Toileting schedule implemented to reduce anxiety or agitation around incontinent episodes    Other safety precautions in place: HAPI Prevention Bundle    Restraints: No       CBHP Patient: No   Patient Education  This RN provided education to Patient and Family today. They were non-receptive. and needs reinforcement..  Quality/Safety Education:   CLABSI prevention, Fall risk, and VTE prophylaxis  Medication Education:   Medication management (Indication, adverse effects, monitoring, etc)  Education provided on the following medication(s): doxycycline , protonix , lipitor, montelukast , zosyn   Continue to address: indications  General Education:   Discharge planning and Mobility/Activity intolerance    The following teaching method(s) were used: Verbal  Follow-up should occur: daily.

## 2023-07-27 NOTE — Progress Notes
 RT Adult Assessment Note    NAME:Javier Gutierrez             MRN: [id number]             DOB:03-04-1940          AGE: 83 y.o.  ADMISSION DATE: 07/25/2023             DAYS ADMITTED: LOS: 0 days    Additional Comments:  Impressions of the patient: Unlabored breathing. Weak cough. Attempt to wean off oxygen today.   Intervention(s)/outcome(s): adding NaCl 3% Neb BID for AC. Continue on Duo Neb TID&PRN. Brovana  BID. Pulmicort  BID. Vib PEP BID&PRN. IS Q1H.    Vital Signs:  Pulse: 87  RR: 18 PER MINUTE  SpO2: 100 %  O2 Device: (S) None (Room air)  Liter Flow:    O2%:      Breath Sounds:   Breath Sounds WDL: Within Defined Limits  Right Apex Breath Sounds: Clear (Implies normal)  Right Base Breath Sounds: Decreased  Left Apex Breath Sounds: Coarse crackles  Left Base Breath Sounds: Decreased  Respiratory Effort:   Respiratory WDL: Within Defined Limits  Respiratory Effort/Pattern: Unlabored

## 2023-07-27 NOTE — Progress Notes
 General Medicine Progress Note      Name:  Javier Gutierrez                                             MRN:  [id number]   Admission Date:  07/25/2023                     Assessment/Plan:    Principal Problem:    Abdominal pain  Active Problems:    Cardiomyopathy (CMS-HCC)    History of renal artery stenosis    Hypertension    Stage 3 chronic kidney disease (CMS-HCC)    Orthostatic hypotension    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)    Patient is an 83 year old male with a history of COPD, heart failure with reduced ejection fraction, A-fib on anticoagulation, PAD, history of CVA with expressive aphasia, 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  Presented with right-sided flank pain, leukocytosis of 15,000, CT abdomen/pelvis demonstrated 3 mm right-sided ureteral calculus with hydroureteronephrosis.  There is mild bladder wall thickening as well as possible findings of chronic outlet obstruction felt to be cystitis.  UA showed trace leuks, packed RBCs and did not meet criteria for culture.  Urology was consulted and he underwent right-sided ureteroscopy with right-sided ureteral stent placement and will arrange for definitive stone management as an outpatient  - Patient currently not on Flomax has been unable to tolerate it (developed hypotension in the past)    Acute hypoxic respiratory failure secondary to pneumonia versus volume overload versus ongoing silent aspiration  Leukocytosis, etiology secondary to infection versus reactive from nephrolithiasis and procedure  Mild volume overload  - Patient with leukocytosis of 15,000 when presented, noted to have bilateral lower lung pulmonary opacities similar on the right from May imaging and increased on the left, radiographically appeared pneumonia versus aspiration versus atelectasis.  - MRSA pneumonia screen was negative, procalcitonin was 0.1 at presentation  - Presentation was felt he had mild volume overload and heart failure was consulted and he was diuresed with IV Lasix  40 mg x 1  Plan:  > Will obtain CT chest without contrast to better delineate for consolidation versus atelectasis, suspect he could be having silent aspiration has been very susceptible to mental status changes with acute illness.  Family understands he is at risk for ongoing aspiration but favors to continue to the patient to eat as he is able to tolerate  > Continue Zosyn  and doxycycline -day 1 of antibiotics 7/3    Moderate dysphagia  - Patient has been increasingly susceptible to mental status changes with acute illness, SLP attempted to evaluate on 7/5 but patient was too drowsy.  Family favored continuing pur?ed diet as he is tolerated this in the past when he has been arousable  - They understand risk of ongoing aspiration    History of mild cognitive decline  History of CVA with expressive aphasia  - Baseline patient tends to be alert and oriented x 2-3 with difficulty in word finding    History  of atrial fibrillation-continue PTA Eliquis , metoprolol  12.5 mg twice daily  Hx of COPD? 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    Total Time Today was > 50 minutes minutes in the following activities: Preparing to see the patient, Obtaining and/or reviewing separately obtained history, Performing a medically appropriate examination and/or evaluation, Counseling and educating the patient/family/caregiver, Ordering medications, tests, or procedures, Documenting clinical information in the electronic or other health record, Independently interpreting results (not separately reported) and communicating results to the patient/family/caregiver and Care coordination (not separately reported)    Note to patient: The 21st Century Cures Act makes medical notes like these available to patients in the interest of transparency. However, be advised this is a medical document. It is intended as peer to peer communication. It is written in medical language and may contain abbreviations or verbiage that are unfamiliar. It may appear blunt or direct. Medical documents are intended to carry relevant information, facts as evident, and the clinical opinion of the practitioner.    Lyanne Galt, DO  Pager [phone]    __________________________________________________________________________________  Subjective: Javier Gutierrez is a 83 y.o. male     Patient only oriented to self and loosely to location but otherwise not time.  His daughter is at the bedside and helps provide collateral history and he had initially presented with right-sided kidney pain and in the ER was noted to be dyspneic and found to have evidence of volume overload.  His daughter also notes that he has not eaten anything meaningful in the last 2 days until this morning.  The first day he came in he was initially n.p.o. for procedure and yesterday he was too drowsy the entire day to eat anything.  He was previously hospitalized in May with rhinovirus and suspected neurotoxicity secondary to cefepime , he subsequently has made meaningful recovery according to his daughter who provides the majority of his care.  She had even had hospice evaluate the patient at home and they felt he was doing pretty well.  He is typically not required supplemental oxygen in the past.  He does not appear to have labored breathing and has not been on a diuretic previously.    Medications:  Scheduled Meds:albuterol -ipratropium (DUONEB) nebulizer solution 3 mL, 3 mL, Inhalation, TID & PRN  [Held by Provider] apixaban  (ELIQUIS ) tablet 2.5 mg, 2.5 mg, Oral, BID  arformoteroL  (BROVANA ) nebulizer solution 15 mcg, 15 mcg, Inhalation, BID  atorvastatin  (LIPITOR) tablet 20 mg, 20 mg, Oral, QDAY  budesonide  (PULMICORT ) nebulizer solution 0.5 mg, 0.5 mg, Inhalation, BID  [Held by Provider] clopiDOGreL  (PLAVIX ) tablet 75 mg, 75 mg, Oral, QDAY  doxycycline  (VIBRAMYCIN ) 100 mg in sodium chloride  0.9% (NS) 100 mL IVPB (MB+), 100 mg, Intravenous, BID  dutasteride  (AVODART ) capsule 0.5 mg, 0.5 mg, Oral, QDAY  levothyroxine  (SYNTHROID ) tablet 88 mcg, 88 mcg, Oral, QDAY(07)  metoprolol  succinate XL (TOPROL  XL) tablet 12.5 mg, 12.5 mg, Oral, QDAY  mirabegron  (MYRBETRIQ ) ER tablet 25 mg, 25 mg, Oral, QDAY  montelukast  (SINGULAIR ) tablet 10 mg, 10 mg, Oral, QHS  pantoprazole  DR (PROTONIX ) tablet 40 mg, 40 mg, Oral, QDAY  piperacillin /tazobactam (ZOSYN ) 4.5 g in sodium chloride  0.9% (NS) 100 mL IVPB (MB+), 4.5 g, Intravenous, Q6H*    Continuous Infusions:  PRN and Respiratory Meds:albuterol  sulfate PRN, hyoscyamine  Q4H PRN, melatonin QHS PRN, polyethylene glycol 3350  QDAY PRN, sennosides-docusate sodium  QDAY PRN      Physical Exam:  Vital Signs: Last Filed In 24 Hours Vital Signs: 24 Hour Range   BP: 158/72 (07/05 0700)  Temp: 36.3 ?C (97.4 ?F) (07/05 0455)  Pulse: 86 (07/05 0455)  Respirations: 17 PER MINUTE (07/05 0455)  SpO2: 98 % (07/05 0455)  O2 Device: Nasal cannula (07/05 0455)  O2 Liter Flow: 2 Lpm (07/05 0455) BP: (116-158)/(49-103)   Temp:  [36.2 ?C (97.1 ?F)-37.1 ?C (98.7 ?F)]   Pulse:  [86-96]   Respirations:  [15 PER MINUTE-22 PER MINUTE]   SpO2:  [93 %-100 %]   O2 Device: Nasal cannula  O2 Liter Flow: 2 Lpm          General: Arousable, oriented x 2  Eyes:  Conjunctivae/corneas clear. EOMs intact.   Neck:  no JVD  Lungs: Coarse breath sounds bilaterally worse on the left  Chest wall:  No tenderness or deformity.  Heart:  RRR, S1, S2, no other murmurs  Abdomen:  (+)BS, soft, nt/nd  Extremities:  No LE edema  Neurologic: Globally weak    Lab/Radiology/Other Diagnostic Tests:  24-hour labs:    Results for orders placed or performed during the hospital encounter of 07/25/23 (from the past 24 hours)   POC GLUCOSE    Collection Time: 07/27/23 12:59 AM   Result Value Ref Range    Glucose, POC 131 (H) 70 - 100 mg/dL     POC Glucose (Download): (!) 131 (07/27/23 0059)  Pertinent radiology reviewed.    Lyanne ONEIDA Galt, DO  Pager [phone]

## 2023-07-28 ENCOUNTER — Encounter: Admit: 2023-07-28 | Discharge: 2023-07-28 | Payer: MEDICARE

## 2023-07-28 ENCOUNTER — Inpatient Hospital Stay: Admit: 2023-07-28 | Discharge: 2023-07-28 | Payer: MEDICARE

## 2023-07-28 DIAGNOSIS — R109 Unspecified abdominal pain: Principal | ICD-10-CM

## 2023-07-28 LAB — CBC AND DIFF
~~LOC~~ BKR HEMATOCRIT: 34 % — ABNORMAL LOW (ref 40.0–50.0)
~~LOC~~ BKR RBC COUNT: 4.1 10*6/uL — ABNORMAL LOW (ref 4.40–5.50)
~~LOC~~ BKR WBC COUNT: 10 10*3/uL — ABNORMAL LOW (ref 4.50–11.00)

## 2023-07-28 LAB — COMPREHENSIVE METABOLIC PANEL
~~LOC~~ BKR CHLORIDE: 108 mmol/L (ref 98–110)
~~LOC~~ BKR POTASSIUM: 3.4 mmol/L — ABNORMAL LOW (ref 3.5–5.1)

## 2023-07-28 MED ORDER — LABETALOL 5 MG/ML IV SOLN
20 mg | INTRAVENOUS | 0 refills | Status: DC | PRN
Start: 2023-07-28 — End: 2023-08-01
  Administered 2023-07-29 – 2023-07-30 (×4): 20 mg via INTRAVENOUS

## 2023-07-28 MED ORDER — IMS MIXTURE TEMPLATE
30 meq | Freq: Once | ORAL | 0 refills | Status: CP
Start: 2023-07-28 — End: ?
  Administered 2023-07-28 (×2): 30 meq via ORAL

## 2023-07-28 MED ORDER — METOPROLOL SUCCINATE 25 MG PO TB24
12.5 mg | Freq: Two times a day (BID) | ORAL | 0 refills | Status: DC
Start: 2023-07-28 — End: 2023-07-29
  Administered 2023-07-29: 02:00:00 12.5 mg via ORAL

## 2023-07-28 MED ORDER — HYDRALAZINE 50 MG PO TAB
25 mg | ORAL | 0 refills | Status: DC | PRN
Start: 2023-07-28 — End: 2023-08-01
  Administered 2023-07-28 – 2023-08-01 (×8): 25 mg via ORAL

## 2023-07-28 MED ORDER — GUAIFENESIN 100 MG/5 ML PO LIQD
200 mg | ORAL | 0 refills | Status: DC | PRN
Start: 2023-07-28 — End: 2023-08-01
  Administered 2023-07-29 – 2023-08-01 (×5): 200 mg via ORAL

## 2023-07-28 NOTE — Progress Notes
 OCCUPATIONAL THERAPY  ASSESSMENT NOTE    Name: Javier Gutierrez   MRN: [id number]     DOB: [birth year]      Age: 83 y.o.  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 RN notified. Daughter present throughout and stated a family member will be with patient at all times.    Home Living Situation  Lives With: Family;Has 24/7 assistance available;Receives assistance from family  Comments: Daughter and son are caregivers  Type of Home: House  Entry Stairs: No stairs  In-Home Stairs: No stairs  Bathroom Setup: Walk in shower  Patient Owned Equipment: Bathing: Paediatric nurse;Toilet: Grab bars;Walker with wheels;Wheelchair    Prior Level of Function  Level Of Independence: Independent with ADL and household mobility with device  Comments: Set up of ADLs. Requires assist with eating/drinking. Dtr reported patient had been progressing over the last few weeks prior to kidney stone    Precautions   Thickened liquids    ADL's  Where Assessed: In Bathroom  Toileting Assist: Maximum Assist  Toileting Deficits: Clothing Management Up;Clothing Management Down;Supervision/Safety;Perineal Hygiene  Comment: Patient stood while OT completed toilet hygiene. Washed hands seated in chair    ADL Mobility  Bed Mobility: Supine to Sit: Minimal assist  Bed Mobility Comments: HOB elevated and use of hand rails, cues and increased time to scoot to EOB  Transfer Type: Sit to stand  Transfer: Assistance Level: From;Bed;Moderate assist  Transfer: Assistive Device: Agricultural consultant: Type of Assistance: For balance;For safety considerations;For strength deficit;Requires extra time;Verbal cues;Elevated bed  Other Transfer Type: Sit to/from stand  Other Transfer: Assistance Level: To/from;Toilet;Moderate assist  Other Transfer: Assistive Device: Roller walker (Grab bar)  Other Transfer: Type of Assistance: For balance;For safety considerations;For strength deficit;Requires extra time  End of Activity Status: Up in chair;Instructed patient to request assist with mobility;Instructed patient to use call light;Nursing notified  Transfer Comments: Patient retropulsive initially, able to stand upright with min A to find center. Mod A to stand up from low toilet surface.  Standing Balance: Static standing balance;2 UE support;Minimal assist  Gait Distance: 20 feet  Gait: Assistance Level: Minimal assist  Gait: Assistive Device: Roller walker  Gait Comments: Patient ambulated to/from toilet with min A for balance and safety.    Activity Tolerance  Endurance: 2/5 Tolerates 10-20 Minutes Exercise w/Multiple Rests    Cognition  Expression: Expressive Aphasia;Increased Time for Expression;Mumbling/Slurred  Social Interaction: Interacts in a Network engineer;Increased Time to Adjust  Problem Solving: Direction Following Assist;Decreased Judgment/Safety  Attention: Awake/Alert    ROM  R UE ROM: WFL  L UE ROM: [facility]  Grasp: Bilateral Grasp Functional for Activity    Strength / Tone  R UE Strength: WFL  L UE Strength: WFL    Education  Persons Educated: Patient/Family  Barriers To Learning: Impaired Communication  Interventions: Repetition of Instructions;Increase Volume of Instruction  Teaching Methods: Verbal Instruction  Patient Response: Verbalized Understanding  Topics: Role of OT, Goals for Therapy  Goal Formulation: With Patient/Family    Assessment  Assessment: Decreased Safe/Judg during ADL;Decreased Endurance;Decreased Self-Care Trans;Decreased High-Level ADLs  Goal Formulation: Pt/family  Comments: Patient currently limited by weakness, balance, and endurance. Patient is a fall risk. Has good social support from family at home. Will benefit from OT services to progress ADLs and functional mobility to promote independence and decrease caregiver burden.    AM-PAC 6 Clicks Daily Activity Inpatient  Putting on and taking off regular lower body clothes: Total  Bathing (Including washing, rinsing, drying): A Lot  Toileting, which includes using toilet, bedpan, or urinal: A Lot  Putting on and taking off regular upper body clothing: A Lot  Taking care of personal grooming such as brushing teeth: A Little  Eating meals: A Lot  Daily Activity Raw Score: 12  Standardized (T-scale) Score: 30.6    Plan  OT Frequency: 3-5x/week  OT Plan for Next Visit: ADLs standing at sink, toilet hygiene, toilet transfers, sit to stands, progress mobility    ADL Goals  Patient Will Perform All ADL's: w/ Minimal Assist    Functional Transfer Goals  Pt Will Perform All Functional Transfers: Minimum Assist    OT Discharge Recommendations  Recommendation: Inpatient setting  Patient Currently Requires Physical Assist With: All mobility;All personal care ADLs;All home functioning ADLs  Patient Currently Requires Supervision For: ADLs;Eating meals;Mobility;Making decisions about safety    Therapist: Vernell Arla MEMO, OTR/L [id number]  Date: 07/28/2023

## 2023-07-28 NOTE — Progress Notes
 General Medicine Progress Note      Name:  Javier Gutierrez                                             MRN:  [id number]   Admission Date:  07/25/2023                     Assessment/Plan:    Principal Problem:    Abdominal pain  Active Problems:    Cardiomyopathy (CMS-HCC)    History of renal artery stenosis    Hypertension    Stage 3 chronic kidney disease (CMS-HCC)    Orthostatic hypotension    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)    Patient is an 83 year old male with a history of COPD, heart failure with reduced ejection fraction, A-fib on anticoagulation, PAD, history of CVA with expressive aphasia, 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  Presented with right-sided flank pain, leukocytosis of 15,000, CT abdomen/pelvis demonstrated 3 mm right-sided ureteral calculus with hydroureteronephrosis.  There is mild bladder wall thickening as well as possible findings of chronic outlet obstruction felt to be cystitis.  UA showed trace leuks, packed RBCs and did not meet criteria for culture.  Urology was consulted and he underwent right-sided ureteroscopy with right-sided ureteral stent placement and will arrange for definitive stone management as an outpatient  - Patient currently not on Flomax has been unable to tolerate it (developed hypotension in the past)    Acute hypoxic respiratory failure secondary to pneumonia versus volume overload versus ongoing silent aspiration  Leukocytosis, etiology secondary to infection versus reactive from nephrolithiasis and procedure  Mild volume overload  - Patient with leukocytosis of 15,000 when presented, noted to have bilateral lower lung pulmonary opacities similar on the right from May imaging and increased on the left, radiographically appeared pneumonia versus aspiration versus atelectasis.  - MRSA pneumonia screen was negative, procalcitonin was 0.1 at presentation  - Presentation was felt he had mild volume overload and heart failure was consulted and he was diuresed with IV Lasix  40 mg x 1  -Past with evidence of multifocal pneumonia, in the context of his ongoing dysphagia this most likely represents ongoing aspiration especially in light of negative procalcitonin  -Was on Doxy from 7/3 to 7/6  Plan:  > Continue Zosyn , will plan on 5 days of antibiotics (day 1 on 7/3) for coverage for aspiration  > Family understands he is at risk for ongoing aspiration but favors to continue to the patient to eat as he is able to tolerate  > Continue Zosyn  and doxycycline -day 1 of antibiotics 7/3    Moderate dysphagia  - Patient has been increasingly susceptible to mental status changes with acute illness, SLP attempted to evaluate on 7/5 but patient was too drowsy.  Family favored continuing pur?ed diet as he is tolerated this in the past when he has been arousable  - They understand risk of ongoing aspiration and would favor to continue to eat pureed with thickened liquids  - Do not  believe patient can tolerate esophogram(due to inability to swallow thin liquids) but will attempt video swallow with esophogeal sweeps to eval for esophageal component    AKI on CKD, III  - Baseline has been extremely variable but appears to be around 1.2-1.4 and has frequent spikes to the 1.6 range and occasionally does get into the 1.1 range  - Creatinine has been mildly elevated likely secondary to acute illness in the setting of prolonged decreased p.o. intake (does not eat much from 7/3 to 7/5)  - Continue to monitor    History of mild cognitive decline  History of CVA with expressive aphasia  - Baseline patient tends to be alert and oriented x 2-3 with difficulty in word finding    GERD-continue PTA PPI  History of atrial fibrillation-continue PTA Eliquis , metoprolol  12.5 mg twice daily  Hx of COPD? 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    Total Time Today was > 50 minutes minutes in the following activities: Preparing to see the patient, Obtaining and/or reviewing separately obtained history, Performing a medically appropriate examination and/or evaluation, Counseling and educating the patient/family/caregiver, Ordering medications, tests, or procedures, Documenting clinical information in the electronic or other health record, Independently interpreting results (not separately reported) and communicating results to the patient/family/caregiver and Care coordination (not separately reported)    Note to patient: The 21st Century Cures Act makes medical notes like these available to patients in the interest of transparency. However, be advised this is a medical document. It is intended as peer to peer communication. It is written in medical language and may contain abbreviations or verbiage that are unfamiliar. It may appear blunt or direct. Medical documents are intended to carry relevant information, facts as evident, and the clinical opinion of the practitioner.    Lyanne Galt, DO  Pager [phone]    __________________________________________________________________________________  Subjective: LYNX Gutierrez is a 83 y.o. male     No acute events overnight.  Oriented to self, daughter at the bedside reports he probably had the best day he has had in a while in terms of p.o. intake yesterday we did not sleep at all overnight.  And has been extremely tired this morning.  He denies any chest pain or dyspnea.  No nausea or emesis.    We discussed the CT findings and they know that he is likely having ongoing aspiration as there is an esophageal component still do not believe we can truly modify his oropharyngeal dysphagia that places him at risk for ongoing aspiration and subsequent hospitalizations.  Daughter had shared previously that she had hospice evaluate the patient while at home and they felt he was doing well and did not qualify.  She is hopeful much of the decline right now is related to acute illness and that he can once again make a meaningful recovery    Medications:  Scheduled Meds:albuterol -ipratropium (DUONEB) nebulizer solution 3 mL, 3 mL, Inhalation, TID & PRN  apixaban  (ELIQUIS ) tablet 2.5 mg, 2.5 mg, Oral, BID  arformoteroL  (BROVANA ) nebulizer solution 15 mcg, 15 mcg, Inhalation, BID  atorvastatin  (LIPITOR) tablet 20 mg,  20 mg, Oral, QDAY  budesonide  (PULMICORT ) nebulizer solution 0.5 mg, 0.5 mg, Inhalation, BID  clopiDOGreL  (PLAVIX ) tablet 75 mg, 75 mg, Oral, QDAY  doxycycline  (VIBRAMYCIN ) 100 mg in sodium chloride  0.9% (NS) 100 mL IVPB (MB+), 100 mg, Intravenous, BID  dutasteride  (AVODART ) capsule 0.5 mg, 0.5 mg, Oral, QDAY  levothyroxine  (SYNTHROID ) tablet 88 mcg, 88 mcg, Oral, QDAY(07)  metoprolol  succinate XL (TOPROL  XL) tablet 12.5 mg, 12.5 mg, Oral, BID  mirabegron  (MYRBETRIQ ) ER tablet 25 mg, 25 mg, Oral, QDAY  montelukast  (SINGULAIR ) tablet 10 mg, 10 mg, Oral, QHS  pantoprazole  DR (PROTONIX ) tablet 40 mg, 40 mg, Oral, QDAY  piperacillin /tazobactam (ZOSYN ) 4.5 g in sodium chloride  0.9% (NS) 100 mL IVPB (MB+), 4.5 g, Intravenous, Q8H*  sodium chloride  (NEBUSAL) 3 % nebulizer solution 4 mL, 4 mL, Inhalation, BID    Continuous Infusions:  PRN and Respiratory Meds:albuterol  sulfate PRN, hydrALAZINE  Q8H PRN, hyoscyamine  Q4H PRN, labetalol  (NORMODYNE ; TRANDATE ) injection Q4H PRN, melatonin QHS PRN, polyethylene glycol 3350  QDAY PRN, sennosides-docusate sodium  QDAY PRN      Physical Exam:  Vital Signs: Last Filed In 24 Hours Vital Signs: 24 Hour Range   BP: 155/89 (07/06 1239)  Temp: 36.7 ?C (98 ?F) (07/06 1239)  Pulse: 91 (07/06 0959)  Respirations: 16 PER MINUTE (07/06 1239)  SpO2: 97 % (07/06 1239)  O2 Device: None (Room air) (07/06 0959) BP: (137-188)/(56-89)   Temp:  [36.3 ?C (97.4 ?F)-36.7 ?C (98 ?F)]   Pulse:  [86-96]   Respirations:  [16 PER MINUTE-18 PER MINUTE]   SpO2:  [94 %-100 %]   O2 Device: None (Room air)          General: Arousable, oriented x 1-2  Eyes:  Conjunctivae/corneas clear. EOMs intact.   Neck:  no JVD  Lungs: Coarse breath sounds bilaterally worse on the left  Chest wall:  No tenderness or deformity.  Heart:  RRR, S1, S2, no other murmurs  Abdomen:  (+)BS, soft, nt/nd  Extremities:  No LE edema  Neurologic: Globally weak    Lab/Radiology/Other Diagnostic Tests:  24-hour labs:    Results for orders placed or performed during the hospital encounter of 07/25/23 (from the past 24 hours)   CBC AND DIFF    Collection Time: 07/28/23  6:08 AM   Result Value Ref Range    White Blood Cells 10.80 4.50 - 11.00 10*3/uL    Red Blood Cells 4.12 (L) 4.40 - 5.50 10*6/uL    Hemoglobin 11.4 (L) 13.5 - 16.5 g/dL    Hematocrit 65.7 (L) 40.0 - 50.0 %    MCV 83.1 80.0 - 100.0 fL    MCH 27.7 26.0 - 34.0 pg    MCHC 33.3 32.0 - 36.0 g/dL    RDW 81.1 (H) 88.9 - 15.0 %    Platelet Count 289 150 - 400 10*3/uL    MPV 8.5 7.0 - 11.0 fL    Neutrophils 77.0 41.0 - 77.0 %    Lymphocytes 7.0 (L) 24.0 - 44.0 %    Monocytes 7.4 4.0 - 12.0 %    Eosinophils 8.3 (H) 0.0 - 5.0 %    Basophils 0.3 0.0 - 2.0 %    Absolute Neutrophil Count 8.30 (H) 1.80 - 7.00 10*3/uL    Absolute Lymph Count 0.80 (L) 1.00 - 4.80 10*3/uL    Absolute Monocyte Count 0.80 0.00 - 0.80 10*3/uL    Absolute Eosinophil Count 0.90 (H) 0.00 - 0.45 10*3/uL    Absolute Basophil Count 0.00 0.00 - 0.20 10*3/uL  COMPREHENSIVE METABOLIC PANEL    Collection Time: 07/28/23  6:08 AM   Result Value Ref Range    Sodium 146 137 - 147 mmol/L    Potassium 3.4 (L) 3.5 - 5.1 mmol/L    Chloride 108 98 - 110 mmol/L    Glucose 111 (H) 70 - 100 mg/dL    Blood Urea Nitrogen 40 (H) 7 - 25 mg/dL    Creatinine 8.20 (H) 0.40 - 1.24 mg/dL    Calcium 8.8 8.5 - 89.3 mg/dL    Total Protein 5.9 (L) 6.0 - 8.0 g/dL    Total Bilirubin 0.4 0.2 - 1.3 mg/dL    Albumin  2.7 (L) 3.5 - 5.0 g/dL    Alk Phosphatase 42 25 - 110 U/L    AST 11 7 - 40 U/L    ALT 10 7 - 56 U/L    CO2 27 21 - 30 mmol/L    Anion Gap 11 3 - 12    Glomerular Filtration Rate (GFR) 37 (L) >60 mL/min   MAGNESIUM     Collection Time: 07/28/23  6:08 AM   Result Value Ref Range    Magnesium  1.9 1.6 - 2.6 mg/dL     Glucose: (!) 888 (92/93/74 9391)  Pertinent radiology reviewed.    Lyanne ONEIDA Galt, DO  Pager [phone]

## 2023-07-28 NOTE — Care Plan
 Problem: Discharge Planning  Goal: Participation in plan of care  Outcome: Goal Ongoing  Flowsheets (Taken 07/28/2023 1559)  Participation in Plan of Care: Involve patient/caregiver in care planning decision making  Goal: Knowledge regarding plan of care  Outcome: Goal Ongoing  Flowsheets (Taken 07/28/2023 1559)  Knowledge regarding plan of care: Provide admission education to parent/caregiver  Goal: Prepared for discharge  Outcome: Goal Ongoing     Problem: Skin Integrity  Goal: Skin integrity intact  Outcome: Goal Ongoing  Flowsheets (Taken 07/28/2023 1559)  Skin integrity intact:   Assess nutrition   Promote nutrition   Assure position change   Monitor skin integrity  Goal: Healing of skin (Wound & Incision)  Outcome: Goal Ongoing  Flowsheets (Taken 07/28/2023 1559)  Healing of wound (wounds and Incisions): Assess for signs and symptoms of wound infection  Goal: Healing of skin (Pressure Injury)  Outcome: Goal Ongoing  Flowsheets (Taken 07/28/2023 1559)  Healing of skin (Pressure Injury): Assess for signs and symptoms of pressure injury infection     Problem: High Fall Risk  Goal: High Fall Risk  Outcome: Goal Ongoing  Flowsheets (Taken 07/28/2023 1559)  High Fall Risk:   All patients will receive: High fall risk sign, yellow wristband, yellow socks, gait belt, and shower shoes   PT/OT consult for fall prevention assessment if scoring in Unsteady Gait or Visual or Auditory impairment   Engage bed alarm - middle setting   Stay with the patient while toileting/showering   Engage chair alarm   Remove excess equipment/supplies   Educate patient to use call-light if tethered   Use shower shoes   Maximize bed functionality (optimize bed height, firm/flat surface)   Assess need for bedside commode with drop arm to be available in room   Use safe patient handling equipment as appropriate     Problem: Glucose Management  Goal: Absence of hyperglycemia  Outcome: Goal Ongoing  Goal: Absence of Hypoglycemia  Outcome: Goal Ongoing  Goal: Glucose level within specified parameters  Outcome: Goal Ongoing     Problem: Glucose Management  Goal: Absence of hyperglycemia  Outcome: Goal Ongoing  Goal: Absence of Hypoglycemia  Outcome: Goal Ongoing  Goal: Glucose level within specified parameters  Outcome: Goal Ongoing

## 2023-07-28 NOTE — Progress Notes
 07/28/23 0408 07/28/23 0418   Vitals   Pulse 86 87   Respirations 16 PER MINUTE  --    SpO2 94 % 94 %   O2 Device None (Room air) None (Room air)   BP (!) 176/89 (!) 188/85   Mean NBP (Calculated) (!) 118 MM HG (!) 119 MM HG   BP Source Arm, Left Upper Arm, Right Upper   BP Method Automatic Automatic   BP Patient Position Head of bed (Comment degree) Head of bed (Comment degree)     Dr. Patt informed above BP's. Hydralazine  order received.

## 2023-07-28 NOTE — Progress Notes
 Cardiology Progress Note      NAME:Javier Gutierrez             MRN: [id number]                 DOB:1940/03/03          AGE: 83 y.o.  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    He had a recent hospitalization at [facility] in  February 2025 for hypertensive emergency and obstructive renal stone in the left atrophic kidney. He had another admission in April for pseudomonal pneumonia and again in May 2025 for hypoxia, LLL pneumonia.     He seemed to recover from this but has been noted by daughter to have some increased dyspnea past few days. Unclear if cough has worsened. Was admitted on 7/3 due to right flank pain, diagnosed with right ureteral stone. BNP elevated at 5,896 on admission. Chest xray, CT abd/pelvis shows small pleural effusions, possible pneumonia. Urology felt could not give copious fluids due to potential CHF and did not tolerate Flomax so recommended ureteral stent, which was placed on 7/4.    Cardiology was consulted for assistance with HF management. We recommended Toprol -XL and felt he was minimally hypervolemic on exam.  As needed diuretic dosing was recommended.    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  Most recent LVEF from March of this year 45% with grade 1 diastolic dysfunction.   NT proBNP on admission 5,896.  PTA GDMT Toprol -XL 12.5 mg daily and PRN lasix , all other GDMT limited due to hypotension.  Received Lasix  40 mg IV x1 on admission.    Paroxysmal A-fib.  PTA Eliquis  and Toprol -XL.    Right kidney stone with hydronephrosis.  CT abdomen pelvis from 7/3 revealed a 3 mm right mid ureteral calculus.  He underwent ureterorenoscopy with stent placement on 7/4.    CKD stage III.  He has a atrophic left kidney.  Baseline creatinine is 1.1-1.4.  Creatinine on admission 1.15 > 1.79 today    Recurrent pneumonia.  Recent pneumonia plus rhinovirus/enterovirus.  Has known aspiration risk, SLP ordered    Labile hypertension/orthostatic hypotension.    CVA  With expressive aphasia.  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  He has had a CEA on the left side and a carotid stent placed in 2020.  Carotid duplex in 2023 showed mild plaque and no significant stenosis.      PLAN       He appears euvolemic on exam,  would not recommend additional diuretics at this time.   Can increase his Toprol  XL to 12.5mg  BID for better BP control. He has been on this dose in the past and tolerated it well.   If his renal function improves and he continues to be hypertensive, we can consider adding low dose lisinopril  in the coming days.   We will continue to follow and see him tomorrow.       Waddell Auerbach, APRN  Interventional and Adult Congenital Cardiology  Pager [phone] - Available on Voalte      Consultation discussed with Dr. Pierpoline, he will see independently.       SUBJECTIVE     I met with Javier Gutierrez this morning bedside, his daughter largely spoke for him. She reports they are still having trouble with his swallowing and his blood pressure. She does not feel like he's retainin any fluid at this point but does feel it's hard to tell with him. He denies chest pain, shortness of breath.     Review of Systems:  A comprehensive review of systems was negative except as noted in the HPI.      Past Medical History:    Acute on chronic systolic heart failure, NYHA class 2 (CMS-HCC)    Amyloidosis (CMS-HCC)    Aortic valve stenosis, mild    Arrhythmia    Arterial occlusion    Arthritis    Asthma    CAD (coronary artery disease)    Cardiomyopathy (CMS-HCC)    Carotid artery plaque    Cataract    CHF (congestive heart failure) (CMS-HCC)    Chronic kidney disease    Colon polyps    COPD (chronic obstructive pulmonary disease) (CMS-HCC)    Coronary atherosclerosis    Dizziness    Dyspnea    Frailty    H/O: CVA (cardiovascular accident)    History of renal artery stenosis    HTN    Hyperlipidemia    Hyperlipidemia    Hypertension    Hypothyroidism    Kidney disease    Kidney stones    Lung disease    Peripheral vascular disease    Tobacco abuse    Valvular heart disease     Surgical History:   Procedure Laterality Date    HX LUMBAR DISKECTOMY  01/22/1966    30 years ago    STENT INTRAVASCULAR  01/23/2004    kidney stent placed    Left Heart Catheterization With Ventriculogram Left 03/11/2015    Performed by Lannis Maude SAILOR, MD, [facility] at [facility] CATH LAB    Coronary Angiography N/A 03/11/2015    Performed by Lannis Maude SAILOR, MD, [facility] at [facility] CATH LAB    Possible Percutaneous Coronary Intervention N/A 03/11/2015    Performed by Lannis Maude SAILOR, MD, [facility] at [facility] CATH LAB    ESOPHAGOGASTRODUODENOSCOPY N/A 04/26/2015    Performed by Leonce Emerick PARAS, DO at [facility] ENDO    ESOPHAGOGASTRODUODENOSCOPY BIOPSY  04/26/2015    Performed by Leonce Emerick PARAS, DO at [facility] ENDO    REPLACEMENT TRANSCATHETER AORTIC VALVE (Sapien 26s3), right common femoral artery approach N/A 08/03/2015    Performed by Tony Cordella FALCON, MD at [facility] CVOR    BRONCHOSCOPY Bilateral 11/30/2016    Performed by Shereen Rigg, MD at [facility] OR    BRONCHOSCOPY WITH BRONCHIAL ALVEOLAR LAVAGE - FLEXIBLE Right 11/30/2016    Performed by Shereen Rigg, MD at [facility] OR ESOPHAGOGASTRODUODENOSCOPY for PEG placement N/A 12/06/2016    Performed by Buckles,  Toribio BROCKS, MD at [facility] ENDO    ENDARTERECTOMY CAROTID ARTERY Left 02/17/2018    Performed by Idelia Alverna BROCKS, MD at [facility] OR    CYSTOURETHROSCOPY WITH URETEROSCOPY AND/ OR PYELOSCOPY - WITH REMOVAL/ MANIPULATION CALCULUS Right 07/26/2023    Performed by Jama Sherwood POUR, MD at [facility] OR    CYSTOURETHROSCOPY WITH INDWELLING URETERAL STENT INSERTION Right 07/26/2023    Performed by Jama Sherwood POUR, MD at [facility] OR    RETROGRADE UROGRAPHY WITH/ WITHOUT KUB Right 07/26/2023    Performed by Jama Sherwood POUR, MD at [facility] OR    ANOMALOUS VENOUS RETURN REPAIR  2017    ECHOCARDIOGRAM PROCEDURE  05/2022    HX CATARACT REMOVAL  2018    HX HEART CATHETERIZATION  2015    KIDNEY STONE SURGERY  2025     Family History   Problem Relation Name Age of Onset    Stroke Mother Virginia  Calender     Other Mother Virginia  Cali         colon cancer    Other Father          valve disease/COPD    Aortic Disease/Dissection Father      Cancer Mother Virginia  Nieves         Colon    Heart Disease Father Khalib Fendley      Social History     Socioeconomic History    Marital status: Widowed   Tobacco Use    Smoking status: Former     Current packs/day: 0.00     Average packs/day: 1.5 packs/day for 60.0 years (90.0 ttl pk-yrs)     Types: Cigarettes     Start date: 02/13/1958     Quit date: 02/13/2018     Years since quitting: 5.4    Smokeless tobacco: Never    Tobacco comments:     0.5-2 ppd history   Vaping Use    Vaping status: Never Used   Substance and Sexual Activity    Alcohol use: Not Currently    Drug use: Never    Sexual activity: Not Currently     Partners: Female        OBJECTIVE     Echocardiogram 03/29/2023    The left ventricular systolic function is mildly reduced. The visually estimated ejection fraction is 45%. There are segmental wall motion abnormalities, as described below. Abnormal septal motion.    Grade I (mild) left ventricular diastolic dysfunction. Elevated left atrial pressure.    The right ventricular size is normal. The right ventricular systolic function is mildly reduced. M-Mode TAPSE 1.2 cm (normal >1.7 cm).    Severe mitral annular calcification (mostly the posterior annulus), increased diastolic gradient, mild stenosis, MG = 5 mmHg, HR = 80 bpm, trace regurgitation.    There is a bioprosthetic  aortic valve present, # 29 S3 TAVR, MG ~ 5 mmHg, no regurgitation, overall normal function.    The pulmonary artery pressure could not be estimated due to inadequate tricuspid regurgitation signal.    Chest Xray 07/25/2023  Bilateral lower lung mixed pulmonary opacities, similar on the right and   increased on the left. These are nonspecific though favored for   pneumonia/aspiration. A component of atelectasis or scarring may also be   present.   Probable small left and possible trace right pleural effusions.     CT Abdomen Pelvis 07/25/2023  1.  3 mm right mid ureteral calculus, with minimal upstream   hydroureteronephrosis.   2. Additional nonobstructing bilateral renal  calculi. No left   hydronephrosis.   3. Mild bladder wall thickening, which may be secondary to chronic outlet   obstruction; though cystitis could appear similar. Correlation with   urinalysis is recommended.   4.  Trace pleural effusions. Adjacent patchy opacities are likely   atelectasis, though infection and/or aspiration could contribute.   5.  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   6.  Cholelithiasis.     CT Chest w/o 07/27/2023  Increased lower lobe consolidations with other areas of faint tree-in-bud   nodularity, likely multifocal pneumonia on the basis of aspiration.   Similar multifocal central and peripheral bronchiectasis, likely sequela   of chronic infection or aspiration.   Small pleural effusions.     Allergies:   Allergies   Allergen Reactions    Tamsulosin HYPOTENSION    Cefepime  SEE COMMENTS     suspect to be primary contributor to neurotoxicity        Medications:  Scheduled Meds:albuterol -ipratropium (DUONEB) nebulizer solution 3 mL, 3 mL, Inhalation, TID & PRN  apixaban  (ELIQUIS ) tablet 2.5 mg, 2.5 mg, Oral, BID  arformoteroL  (BROVANA ) nebulizer solution 15 mcg, 15 mcg, Inhalation, BID  atorvastatin  (LIPITOR) tablet 20 mg, 20 mg, Oral, QDAY  budesonide  (PULMICORT ) nebulizer solution 0.5 mg, 0.5 mg, Inhalation, BID  clopiDOGreL  (PLAVIX ) tablet 75 mg, 75 mg, Oral, QDAY  doxycycline  (VIBRAMYCIN ) 100 mg in sodium chloride  0.9% (NS) 100 mL IVPB (MB+), 100 mg, Intravenous, BID  dutasteride  (AVODART ) capsule 0.5 mg, 0.5 mg, Oral, QDAY  levothyroxine  (SYNTHROID ) tablet 88 mcg, 88 mcg, Oral, QDAY(07)  metoprolol  succinate XL (TOPROL  XL) tablet 12.5 mg, 12.5 mg, Oral, QDAY  mirabegron  (MYRBETRIQ ) ER tablet 25 mg, 25 mg, Oral, QDAY  montelukast  (SINGULAIR ) tablet 10 mg, 10 mg, Oral, QHS  pantoprazole  DR (PROTONIX ) tablet 40 mg, 40 mg, Oral, QDAY  piperacillin /tazobactam (ZOSYN ) 4.5 g in sodium chloride  0.9% (NS) 100 mL IVPB (MB+), 4.5 g, Intravenous, Q8H*  sodium chloride  (NEBUSAL) 3 % nebulizer solution 4 mL, 4 mL, Inhalation, BID    Continuous Infusions:  PRN and Respiratory Meds:albuterol  sulfate PRN, hydrALAZINE  Q8H PRN, hyoscyamine  Q4H PRN, labetalol  (NORMODYNE ; TRANDATE ) injection Q4H PRN, melatonin QHS PRN, polyethylene glycol 3350  QDAY PRN, sennosides-docusate sodium  QDAY PRN    Medications Prior to Admission   Medication Sig Dispense Refill Last Dose/Taking    acetaminophen  (TYLENOL  EXTRA STRENGTH) 500 mg tablet Take one tablet by mouth every 6 hours as needed for Pain. Max of 4,000 mg of acetaminophen  in 24 hours.   07/24/2023    albuterol -ipratropium (DUONEB) 0.5 mg-3 mg(2.5 mg base)/3 mL nebulizer solution Inhale 3 mL solution by nebulizer as directed four times daily as needed for Wheezing.   07/24/2023    apixaban  (ELIQUIS ) 2.5 mg tablet TAKE 1 TABLET BY MOUTH TWO TIMES A DAY 180 tablet 3 07/25/2023    atorvastatin  (LIPITOR) 20 mg tablet Take one tablet by mouth daily. 90 tablet 3 07/24/2023    budesonide  (PULMICORT ) 0.5 mg/2 mL nebulizer solution Inhale 2 mL solution by nebulizer as directed twice daily.   07/24/2023    clopiDOGrel  (PLAVIX ) 75 mg tablet Take one tablet by mouth daily. 30 tablet 1 07/24/2023    dutasteride  (AVODART ) 0.5 mg PO capsule Take one capsule by mouth daily.   07/24/2023    food supplemt, lactose-reduced (ENSURE PLUS) 0.05 gram- 1.5 kcal/mL oral liquid Take 1 Can by mouth twice daily with meals.       formoterol fumarate (PERFOROMIST) 20 mcg/2 mL nebulizer vial Inhale  2 mL solution by nebulizer as directed twice daily.   07/24/2023    levoFLOXacin  (LEVAQUIN ) 500 mg tablet Take one tablet by mouth daily.   07/25/2023    levothyroxine  (SYNTHROID ) 88 mcg tablet Take one tablet by mouth daily 30 minutes before breakfast.   07/25/2023    metoprolol  succinate XL (TOPROL  XL) 25 mg extended release tablet Take one-half tablet by mouth daily. 45 tablet 3 07/24/2023    montelukast  (SINGULAIR ) 10 mg tablet Take one tablet by mouth at bedtime daily. 30 tablet 1 07/24/2023    MYRBETRIQ  25 mg tablet Take one tablet by mouth daily.   07/24/2023    pantoprazole  DR (PROTONIX ) 40 mg tablet Take one tablet by mouth daily. 30 tablet 1 07/24/2023                             Vital Signs:  Last Filed                Vital Signs: 24 Hour Range   BP: 173/76 (07/06 0500)  Temp: 36.3 ?C (97.4 ?F) (07/06 0408)  Pulse: 87 (07/06 0418)  Respirations: 16 PER MINUTE (07/06 0408)  SpO2: 94 % (07/06 0418)  O2 Device: None (Room air) (07/06 0418)  O2 Liter Flow: 1 Lpm (07/05 1245)  BP: (137-188)/(56-89)   Temp:  [36.3 ?C (97.4 ?F)-36.7 ?C (98 ?F)]   Pulse:  [86-96]   Respirations:  [16 PER MINUTE-18 PER MINUTE]   SpO2:  [94 %-100 %]   O2 Device: None (Room air)  O2 Liter Flow: 1 Lpm           Wt Readings from Last 10 Encounters:   07/27/23 70.2 kg (154 lb 12.2 oz)   06/07/23 70 kg (154 lb 5.2 oz)   05/17/23 73.1 kg (161 lb 3.2 oz)   05/11/23 72.6 kg (160 lb)   04/11/23 72.9 kg (160 lb 12.8 oz)   03/29/23 72.6 kg (160 lb) 03/12/23 75.1 kg (165 lb 9.1 oz)   01/12/23 76.4 kg (168 lb 8 oz)   11/24/22 74.7 kg (164 lb 9.6 oz)   08/28/22 74.3 kg (163 lb 12.8 oz)       Physical exam  Gen: ill and frail appearing 83 year old male, in no acute distress  Eyes: sclera non-icteric, EOMs intact   Neck: no JVD, no carotid bruits auscultated  Lungs: faint crackles in bilateral lower lung fields  Heart: RRR without murmur or gallop appreciated  Abdomen: soft, nontender, bowel sounds present  Extremities: no lower extremity edema, no cyanosis, pedal pulses are intact. Feet are warm, with preserved capillary refill.  Skin: warm and dry  Neurological: A&Ox2, weak, no focal deficits noted  Psychiatric: calm, pleasant, and cooperative      Laboratory Review:   CBC w diff    Lab Results   Component Value Date/Time    WBC 10.80 07/28/2023 06:08 AM    RBC 4.12 (L) 07/28/2023 06:08 AM    HGB 11.4 (L) 07/28/2023 06:08 AM    HCT 34.2 (L) 07/28/2023 06:08 AM    MCV 83.1 07/28/2023 06:08 AM    MCH 27.7 07/28/2023 06:08 AM    MCHC 33.3 07/28/2023 06:08 AM    RDW 18.8 (H) 07/28/2023 06:08 AM    PLTCT 289 07/28/2023 06:08 AM    MPV 8.5 07/28/2023 06:08 AM    Lab Results   Component Value Date/Time    NEUT 77.0 07/28/2023 06:08 AM  ANC 8.30 (H) 07/28/2023 06:08 AM    LYMA 7.0 (L) 07/28/2023 06:08 AM    ALC 0.80 (L) 07/28/2023 06:08 AM    MONA 7.4 07/28/2023 06:08 AM    AMC 0.80 07/28/2023 06:08 AM    EOSA 8.3 (H) 07/28/2023 06:08 AM    AEC 0.90 (H) 07/28/2023 06:08 AM    BASA 0.3 07/28/2023 06:08 AM    ABC 0.00 07/28/2023 06:08 AM         Chemistry    Lab Results   Component Value Date/Time    NA 146 07/28/2023 06:08 AM    K 3.4 (L) 07/28/2023 06:08 AM    CL 108 07/28/2023 06:08 AM    CO2 27 07/28/2023 06:08 AM    GAP 11 07/28/2023 06:08 AM    BUN 40 (H) 07/28/2023 06:08 AM    CR 1.79 (H) 07/28/2023 06:08 AM    GLU 111 (H) 07/28/2023 06:08 AM    MG 1.9 07/28/2023 06:08 AM    BNP 95.0 06/29/2018 06:53 AM    Lab Results   Component Value Date/Time    CA 8.8 07/28/2023 06:08 AM    PO4 2.1 03/12/2023 05:49 AM    ALBUMIN  2.7 (L) 07/28/2023 06:08 AM    TOTPROT 5.9 (L) 07/28/2023 06:08 AM    ALKPHOS 42 07/28/2023 06:08 AM    AST 11 07/28/2023 06:08 AM    ALT 10 07/28/2023 06:08 AM    TOTBILI 0.4 07/28/2023 06:08 AM    GFR 37 (L) 07/28/2023 06:08 AM    GFRAA 48 (L) 06/29/2018 06:53 AM            Renal Function    Lab Results   Component Value Date/Time    NA 146 07/28/2023 06:08 AM    K 3.4 (L) 07/28/2023 06:08 AM    CL 108 07/28/2023 06:08 AM    CO2 27 07/28/2023 06:08 AM    GAP 11 07/28/2023 06:08 AM    BUN 40 (H) 07/28/2023 06:08 AM    BUN 30 (H) 07/27/2023 10:41 AM    BUN 15 07/26/2023 05:20 AM    Lab Results   Component Value Date/Time    CR 1.79 (H) 07/28/2023 06:08 AM    CR 1.61 (H) 07/27/2023 10:41 AM    CR 1.40 (H) 07/26/2023 05:20 AM    GLU 111 (H) 07/28/2023 06:08 AM    CA 8.8 07/28/2023 06:08 AM    PO4 2.1 03/12/2023 05:49 AM    ALBUMIN  2.7 (L) 07/28/2023 06:08 AM        Lipid Profile INR   Lab Results   Component Value Date    CHOL 120 04/04/2022    TRIG 107 04/04/2022    HDL 29 (L) 04/04/2022    LDL 70 04/04/2022    VLDL 21 04/04/2022    NONHDLCHOL 167 02/12/2018    CHOLHDLC 4 04/04/2022         Lab Results   Component Value Date    INR 2.5 (H) 07/25/2023            Telemetry 07/28/2023: NSR 80-90s, rare PVCs, one episode of SVT lasting approximately 5 seconds around 0630 this AM     EKG 07/25/2023: sinus rhythm with RBBB, LAFB, 96bpm

## 2023-07-28 NOTE — Progress Notes
 BH [facility] END OF SHIFT NOTE    Nursing Shift: Other: 0700-1500    Admission Date: 07/25/2023    Acute events, interventions, and provider communication:   Notified Dr. Nadella, Shravan, DO that patient right arm swelling, possible fluid collection/blister, and pain when touch and family reported that might from IV infiltration during night shift. Per Dr. Danell, elevation of arm and cold/warm compression per patient's preference.     Patient Goal(s)  Patient will improve nutritional intake  by the end of next shift.        Patient will verbalize readiness for discharge by discharge.   Heart Failure Shift Summary    Admission Weight: Weight: 69.9 kg (154 lb)    Last 3 Weights:   Vitals:    07/25/23 1441 07/27/23 1245   Weight: 69.9 kg (154 lb) 70.2 kg (154 lb 12.2 oz)     Weight Change: Weight trend increasing    Intake/Output Summary (Last 24 hours) at 07/28/2023 1607  Last data filed at 07/28/2023 1238  Gross per 24 hour   Intake 180 ml   Output 600 ml   Net -420 ml     Last Bowel Movement Date:  (PTA)    Fluid Restriction? No   Quality/Safety    Total Fall Risk Score: 14   Risk for Injury related to falls: Coagulopathies/risk for bleed  Fall Risk Category:   History of More Than One Fall Within 6 Months Before Admission: No  Elimination, Bowel and Urine: Incontinence OR urgency OR frequency  Interventions: Bedside commode readily available and Bedpan available in room  Medications: On 2 or more high fall risk drugs  Interventions: Educate patient on medication side effects  Patient Care Equipment: One present  Interventions: Ensure environment is free of clutter and tripping hazards  Mobility: 2 - Assistance required  Interventions: Patient educated over how their mobility status places them at risk for falls and PT/OT consulted (weakness, unsteady gait, etc.)  Cognition: 1 - Altered awareness of immediate physical environment  Interventions: Bed and chair alarms in place (REQUIRED)    Other safety precautions in place: HAPI Prevention Bundle and Aspiration Precautions    Restraints: No       CBHP Patient: No   Patient Education  This RN provided education to Patient today. They were receptive. and had no evidence of understanding/learning. .  Quality/Safety Education:   Fall risk, Pressure injury prevention/care, and VTE prophylaxis  Medication Education:   Medication management (Indication, adverse effects, monitoring, etc)  Education provided on the following medication(s): see MAR  Continue to address: indications  General Education:   Diet/nutrition and Mobility/Activity intolerance    The following teaching method(s) were used: Verbal  Follow-up should occur: daily.

## 2023-07-29 ENCOUNTER — Encounter: Admit: 2023-07-29 | Discharge: 2023-07-29 | Payer: MEDICARE

## 2023-07-29 LAB — CULTURE - RESPIRATORY

## 2023-07-29 MED ORDER — HYDRALAZINE 50 MG PO TAB
25 mg | Freq: Once | ORAL | 0 refills | Status: CP
Start: 2023-07-29 — End: ?
  Administered 2023-07-29: 23:00:00 25 mg via ORAL

## 2023-07-29 MED ORDER — CARVEDILOL 6.25 MG PO TAB
6.25 mg | Freq: Two times a day (BID) | ORAL | 0 refills | Status: DC
Start: 2023-07-29 — End: 2023-07-30
  Administered 2023-07-29 – 2023-07-30 (×2): 6.25 mg via ORAL

## 2023-07-29 NOTE — Progress Notes
 SPEECH-LANGUAGE PATHOLOGY  NO TREATMENT NOTE     Orders received and appreciated for video swallow study. Unable to complete this date 2/2 radiology scheduling availability. Will complete as schedule allows.     Therapist: Carmelita Moores, SLP MA, [facility] Voalte# [phone]  Date: 07/29/2023

## 2023-07-29 NOTE — Case Management (ED)
 Case Management Admission Assessment    NAME:Javier Gutierrez                          MRN: [id number]             DOB:January 25, 1940          AGE: 83 y.o.  ADMISSION DATE: 07/25/2023             DAYS ADMITTED: LOS: 2 days      Today?s Date: 07/29/2023    Source of Information: Patient's daughter Wanda.  Patient was asleep at the time of the assessment and did not awaken during the conversation with the daughter.    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  Provided contact information and explanation of SW/NCM roles.  Reviewed [facility], Preparing for Discharge, and [facility] hand-outs.  Provided opportunity for questions and discussion. Pt/family encouraged to contact Case Management team with questions and concerns during hospitalization and until patient is able to transition back to the patient's primary care physician.   Patient currently living with his daughter and SIL at [phone] [address], [place].  If patient returns home at discharge he will return to this address.  Address on the face sheet is the patient's apartment that he has kept.  Daughter home is a one level home with no entry steps.  Daughter provides standby assistance with all ADLs, medication administration, and meal preparation.  PTA patient was able to do his own dressing and toileting.    DME includes nebulizer, oxygen (patient has not used in the past year and daughter unsure of the vendor), SPC, roller walker, rollator, and manual wheelchair.  Patient is current with [facility] for nursing and PT.  This NCM faxed admission clinicals to the agency and notified Beth of the patient's admission.  The agency did evaluate the patient several weeks ago for home hospice and at that point patient was not ready for hospice.  NCM to keep agency updated on discharge disposition.  Daughter states that if patient needs in-patient placement she would like to use [facility] in [place].  Patient has used [facility] [address] [facility] and [facility] in the past.    [facility] is currently seeing the patient for OP palliative services.  Huddle plan:  Video Swallow on 07/30/23.  Palliative Care consult.      Patient Address/Phone  [address]  [place] [address]  [phone] (home)     Emergency Contact  Extended Emergency Contact Information  Primary Emergency Contact: Flanagin,[name]  Address: [phone] [address]           [place], [place] [address] [place]   Home Phone: [phone]  Mobile Phone: [phone]  Relation: Daughter  Preferred language: ENGLISH  Interpreter needed? No  Secondary Emergency Contact: Mongillo,Bill   [place]   Home Phone: [phone]  Mobile Phone: [phone]  Relation: Son  Preferred language: ENGLISH  Interpreter needed? No    Healthcare Directive  Healthcare Directive: Yes, patient  has a healthcare directive  Type of Healthcare Directive: Durable power of attorney for healthcare  Location of Healthcare Directive: Current and verified in document scanning system      Transportation  Does the Patient Need Case Management to Arrange Discharge Transport? (ex: facility, ambulance, wheelchair/stretcher, Medicaid, cab, other): No  Will the Patient Use Family Transport?: Yes  Transportation Name, Phone and Availability #1: Daughter Esai Stecklein [phone]    Expected Discharge Date  07/31/2023     Living Situation Prior to Admission  Living Arrangements  Type of Residence: Home, dependent on others  Living Arrangements: Children (Lives with daughter Wanda and SIL.)  Financial risk analyst / Tub: Psychologist, counselling (Grab bars)  How many levels in the residence?: 1  Can patient live on one level if needed?: N/A  Does residence have entry and/or inside stairs?: No  Assistance needed prior to admit or anticipated on discharge: Yes  Who provides assistance or could if needed?: Daughter Wanda  Are they in good health?: Yes  Can support system provide 24/7 care if needed?: Yes (Daughter works from home.)  Level of Function   Prior level of function: Needs assist with ADLs  Which ADLs require assistance?: Standby assistance for all ADLs.  Meal preparation and medication administration.  Patient had been able to dress himself PTA.  Who assists with ADLs?: Daughter Wanda.  Cognitive Abilities   Cognitive Abilities: Unable to Assess (Patient asleep at the time of the assessment.)    Financial Resources  Coverage  Primary Insurance: Medicare (Part A & B)  Secondary Insurance: Medicare Supplement Therapist, art)  Medication Coverage    Medication Coverage: Medicare Part D  Medicare Part D Plan: [employer] Medicare  Have you experienced a noticeable increase in your copay costs recently?: No  Are current medications affordable?: Yes  Do You Use a Co-Pay Card or a Medication Assistance Program to Help Manage Medication Costs?: No  Do You Manage Your Own Medications?: No  Who is Responsible for Ordering and Setting up Medications?: Daughter Wanda  Source of Income   Source Of Income: SSI  Financial Assistance Needed?  NCM will follow for discharge needs    Psychosocial Needs  Mental Health  Mental Health History: No  Substance Use History  Substance Use History Screen: In the past  Comment: Quit smoking in 2020.  No current use of alcohol.  Other  NCM will follow for discharge needs.    Current/Previous Services  PCP  [facility], [phone], [phone]  Pharmacy    [facility] [place], [place] - [address]  [phone]  [place] [address]  Phone: [phone] Fax: [phone]    Durable Medical Equipment   Durable Medical Equipment at home: Oxygen, Single [address] Rollator, [employer], [employer], Wheelchair (manual) (Patient has not used the oxygen in the past year.  Daughter unsure of the vendor.)  Home Health  Receiving home health: Yes  Agency name: [facility]  Would patient use this agency again?: Yes  Hemodialysis or Peritoneal Dialysis  Undergoing hemodialysis or peritoneal dialysis: No  Tube/Enteral Feeds  Receive tube/enteral feeds: No  Infusion  Receive infusions: No  Private Duty  Private duty help used: No  Home and Community Based Services  Home and community based services: No  [facility]  [facility]: N/A  Hospice  Hospice: No  Outpatient Therapy  PT: Yes  When did patient receive care?: Current with home health services  Name of rehab location/group: [facility]  Would patient return for future services?: Yes  OT: No  SLP: No  Skilled Nursing Facility/Nursing Home  SNF: In the past  Name of Facility: [facility]  Would patient return for future services?:  (Maybe)  NH: No  Inpatient Rehab  IPR: In the past  Name of Facility: [facility]  Would patient return for future services?: Yes  Long-Term Acute Care Hospital  LTACH: No  Acute Hospital Stay  Acute Hospital Stay: In the past  Was patient's stay within the last 30 days?: No (Last hospitalization was in May 2025.)      Rojelio Pizza RN, MSN, CEN  Integrated Nurse Case Manager  [phone]  Office (preferred)  Available on [place] [phone]

## 2023-07-29 NOTE — Progress Notes
 General Medicine Progress Note      Name:  Javier Gutierrez                                             MRN:  [id number]   Admission Date:  07/25/2023                     Assessment/Plan:    Principal Problem:    Abdominal pain  Active Problems:    Cardiomyopathy (CMS-HCC)    History of renal artery stenosis    Hypertension    Stage 3 chronic kidney disease (CMS-HCC)    Orthostatic hypotension    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)    Patient is an 83 year old male with a history of COPD, heart failure with reduced ejection fraction, A-fib on anticoagulation, PAD, history of CVA with expressive aphasia, 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  Presented with right-sided flank pain, leukocytosis of 15,000, CT abdomen/pelvis demonstrated 3 mm right-sided ureteral calculus with hydroureteronephrosis.  There is mild bladder wall thickening as well as possible findings of chronic outlet obstruction felt to be cystitis.  UA showed trace leuks, packed RBCs and did not meet criteria for culture.  Urology was consulted and he underwent right-sided ureteroscopy with right-sided ureteral stent placement and will arrange for definitive stone management as an outpatient  - Patient currently not on Flomax has been unable to tolerate it (developed hypotension in the past)    Acute hypoxic respiratory failure secondary to pneumonia versus volume overload versus ongoing silent aspiration  Leukocytosis, etiology secondary to infection versus reactive from nephrolithiasis and procedure  Mild volume overload  - Patient with leukocytosis of 15,000 when presented, noted to have bilateral lower lung pulmonary opacities similar on the right from May imaging and increased on the left, radiographically appeared pneumonia versus aspiration versus atelectasis.  - MRSA pneumonia screen was negative, procalcitonin was 0.1 at presentation  - Presentation was felt he had mild volume overload and heart failure was consulted and he was diuresed with IV Lasix  40 mg x 1  -Past with evidence of multifocal pneumonia, in the context of his ongoing dysphagia this most likely represents ongoing aspiration especially in light of negative procalcitonin  -Was on Doxy from 7/3 to 7/6  Plan:  > Continue Zosyn , will plan on 5 days of antibiotics (day 1 on 7/3) for coverage for aspiration  > Family understands he is at risk for ongoing aspiration but favors to continue to the patient to eat as he is able to tolerate    Moderate dysphagia  - Patient has been increasingly susceptible to mental status changes with acute illness, SLP attempted to evaluate on 7/5 but patient was too drowsy.  Family favored continuing pur?ed diet as he is tolerated this in the past when he has been arousable  - They understand risk of ongoing aspiration and would favor to continue to eat pureed with thickened liquids  - Do not believe patient can tolerate esophogram(due to inability to swallow thin  liquids) but will attempt video swallow with esophogeal sweeps to eval for esophageal component, unfortunately delayed until 7/8 due to radiology availability     Goals of Care  - Patient's had a decline in his overall functional status especially the last few months, due to hospitalizations and acute illness.  He did have some meaningful improvement after last discharge.  Daughter tells me that he was able to be evaluated by hospice at home who felt he was likely doing too well to qualify for hospice.  Per outpatient discussion with primary care provider the original plan was to try and avoid hospital and the patient again but unfortunately currently hospitalized and quite debilitated.  Daughter is somewhat perplexed/confused on how to best approach overall care for her father.  Evidently had previously said he wants to be full code but does not seem he has insight into what this would entail at this point so for now she has been trying to respect his wishes and keeping him full code but also recognizes that he is been declining.  She is hopeful for getting over the acute illness and possibly getting back to where he was prior to hospitalization but acknowledges that this may not be feasible.  - Palliative care consultation    AKI on CKD, III  - Baseline has been extremely variable but appears to be around 1.2-1.4 and has frequent spikes to the 1.6 range and occasionally does get into the 1.1 range  - Creatinine has been mildly elevated likely secondary to acute illness in the setting of prolonged decreased p.o. intake (does not eat much from 7/3 to 7/5)  - Continue to monitor    History of mild cognitive decline  History of CVA with expressive aphasia  - Baseline patient tends to be alert and oriented x 2-3 with difficulty in word finding    GERD-continue PTA PPI  History of atrial fibrillation-continue PTA Eliquis , changed metoprolol  to coreg  for better BP control  Hx of COPD? 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  PT/OT recommending inpatient, daughter is unsure how to proceed regarding this disposition as she wants him to be at home in a more familiar environment but also wants him to get stronger at skilled.    Patient was discussed at multidisciplinary rounds which included nurse, pharmacist, social work, and nurse case management who provided additional information that affected the patient's care.    Total Time Today was > 50 minutes minutes in the following activities: Preparing to see the patient, Obtaining and/or reviewing separately obtained history, Performing a medically appropriate examination and/or evaluation, Counseling and educating the patient/family/caregiver, Ordering medications, tests, or procedures, Documenting clinical information in the electronic or other health record, Independently interpreting results (not separately reported) and communicating results to the patient/family/caregiver and Care coordination (not separately reported)    Note to patient: The 21st Century Cures Act makes medical notes like these available to patients in the interest of transparency. However, be advised this is a medical document. It is intended as peer to peer communication. It is written in medical language and may contain abbreviations or verbiage that are unfamiliar. It may appear blunt or direct. Medical documents are intended to carry relevant information, facts as evident, and the clinical opinion of the  practitioner.    Lyanne Galt, DO  Pager [phone]    __________________________________________________________________________________  Subjective: Javier Gutierrez is a 83 y.o. male     Patient said hello but did not respond to many other questions.  Evidently he did better in terms of eating yesterday and slept better overnight.  He denies any complaints and communicates this by nodding no to most questions.  We had an extensive discussion regarding overall goals, the patient was present did not participate in the conversation though.    Medications:  Scheduled Meds:albuterol -ipratropium (DUONEB) nebulizer solution 3 mL, 3 mL, Inhalation, TID & PRN  apixaban  (ELIQUIS ) tablet 2.5 mg, 2.5 mg, Oral, BID  arformoteroL  (BROVANA ) nebulizer solution 15 mcg, 15 mcg, Inhalation, BID  atorvastatin  (LIPITOR) tablet 20 mg, 20 mg, Oral, QDAY  budesonide  (PULMICORT ) nebulizer solution 0.5 mg, 0.5 mg, Inhalation, BID  carvediloL  (COREG ) tablet 6.25 mg, 6.25 mg, Oral, BID  clopiDOGreL  (PLAVIX ) tablet 75 mg, 75 mg, Oral, QDAY  dutasteride  (AVODART ) capsule 0.5 mg, 0.5 mg, Oral, QDAY  levothyroxine  (SYNTHROID ) tablet 88 mcg, 88 mcg, Oral, QDAY(07)  mirabegron  (MYRBETRIQ ) ER tablet 25 mg, 25 mg, Oral, QDAY  montelukast  (SINGULAIR ) tablet 10 mg, 10 mg, Oral, QHS  pantoprazole  DR (PROTONIX ) tablet 40 mg, 40 mg, Oral, QDAY  piperacillin /tazobactam (ZOSYN ) 4.5 g in sodium chloride  0.9% (NS) 100 mL IVPB (MB+), 4.5 g, Intravenous, Q8H*  sodium chloride  (NEBUSAL) 3 % nebulizer solution 4 mL, 4 mL, Inhalation, BID    Continuous Infusions:  PRN and Respiratory Meds:albuterol  sulfate PRN, guaiFENesin   (ROBITUSSIN) oral solution Q4H PRN, hydrALAZINE  Q8H PRN, hyoscyamine  Q4H PRN, labetalol  (NORMODYNE ; TRANDATE ) injection Q4H PRN, melatonin QHS PRN, polyethylene glycol 3350  QDAY PRN, sennosides-docusate sodium  QDAY PRN      Physical Exam:  Vital Signs: Last Filed In 24 Hours Vital Signs: 24 Hour Range   BP: 105/65 (07/07 1138)  Temp: 36.3 ?C (97.3 ?F) (07/07 1151)  Pulse: 73 (07/07 1151)  Respirations: 18 PER MINUTE (07/07 1130)  SpO2: 94 % (07/07 1151)  O2 Device: None (Room air) (07/07 1151)  O2 Liter Flow: 2 Lpm (07/07 0414)  Height: 177.8 cm (5' 10) (07/07 0532) BP: (105-192)/(58-94)   Temp:  [36.3 ?C (97.3 ?F)-36.8 ?C (98.3 ?F)]   Pulse:  [73-91]   Respirations:  [16 PER MINUTE-20 PER MINUTE]   SpO2:  [86 %-98 %]   O2 Device: None (Room air)  O2 Liter Flow: 2 Lpm          General: Arousable, oriented x 1-2  Lungs: Coarse breath sounds bilaterally worse on the left  Chest wall:  No tenderness or deformity.  Heart:  RRR, S1, S2, no other murmurs  Extremities:  No LE edema  Neurologic: Globally weak    Lab/Radiology/Other Diagnostic Tests:  24-hour labs:    Results for orders placed or performed during the hospital encounter of 07/25/23 (from the past 24 hours)   CBC AND DIFF    Collection Time: 07/29/23  5:59 AM   Result Value Ref Range    White Blood Cells 6.60 4.50 - 11.00 10*3/uL    Red Blood Cells 4.06 (L) 4.40 - 5.50 10*6/uL    Hemoglobin 11.1 (L) 13.5 - 16.5 g/dL    Hematocrit 66.4 (L) 40.0 - 50.0 %    MCV 82.5 80.0 - 100.0 fL    MCH 27.4 26.0 - 34.0 pg    MCHC 33.2 32.0 - 36.0 g/dL    RDW 80.8 (H) 88.9 - 15.0 %  Platelet Count 270 150 - 400 10*3/uL    MPV 8.4 7.0 - 11.0 fL    Neutrophils 64.3 41.0 - 77.0 %    Lymphocytes 13.4 (L) 24.0 - 44.0 %    Monocytes 9.7 4.0 - 12.0 %    Eosinophils 12.0 (H) 0.0 - 5.0 %    Basophils 0.6 0.0 - 2.0 %    Absolute Neutrophil Count 4.20 1.80 - 7.00 10*3/uL    Absolute Lymph Count 0.90 (L) 1.00 - 4.80 10*3/uL    Absolute Monocyte Count 0.60 0.00 - 0.80 10*3/uL    Absolute Eosinophil Count 0.80 (H) 0.00 - 0.45 10*3/uL    Absolute Basophil Count 0.00 0.00 - 0.20 10*3/uL   COMPREHENSIVE METABOLIC PANEL    Collection Time: 07/29/23  5:59 AM   Result Value Ref Range    Sodium 148 (H) 137 - 147 mmol/L    Potassium 3.5 3.5 - 5.1 mmol/L    Chloride 112 (H) 98 - 110 mmol/L    Glucose 117 (H) 70 - 100 mg/dL    Blood Urea Nitrogen 34 (H) 7 - 25 mg/dL    Creatinine 8.57 (H) 0.40 - 1.24 mg/dL    Calcium 8.6 8.5 - 89.3 mg/dL    Total Protein 5.7 (L) 6.0 - 8.0 g/dL    Total Bilirubin 0.4 0.2 - 1.3 mg/dL    Albumin  2.5 (L) 3.5 - 5.0 g/dL    Alk Phosphatase 40 25 - 110 U/L    AST 9 7 - 40 U/L    ALT 8 7 - 56 U/L    CO2 26 21 - 30 mmol/L    Anion Gap 10 3 - 12    Glomerular Filtration Rate (GFR) 49 (L) >60 mL/min   MAGNESIUM     Collection Time: 07/29/23  5:59 AM   Result Value Ref Range    Magnesium  1.8 1.6 - 2.6 mg/dL     Glucose: (!) 882 (92/92/74 0559)  Pertinent radiology reviewed.    Lyanne ONEIDA Galt, DO  Pager [phone]

## 2023-07-29 NOTE — Progress Notes
 PHYSICAL THERAPY  ASSESSMENT      Name: Javier Gutierrez   MRN: [id number]     DOB: Jun 28, 1940      Age: 83 y.o.  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, who presented with right-sided flank pain found to have nephrolithiasis and evidence of hypoxic respiratory failure  Comments: room air  Mental / Cognitive: Alert;Oriented;Cooperative;Follows commands  Pain: No complaint of pain  Persons Present: Daughter;Nursing Staff    Home Living Situation  Lives With: Family;Has 24/7 assistance available;Receives assistance from family  Comments: Daughter and son are caregivers  Type of Home: House  Entry Stairs: No stairs  In-Home Stairs: No stairs  Bathroom Setup: Walk in shower  Patient Owned Equipment: Bathing: Paediatric nurse;Toilet: Grab bars;Walker with wheels;Wheelchair    Prior Level of Function  Level Of Independence: Independent with ADL and household mobility with device  Comments: Set up of ADLs. Requires assist with eating/drinking. Dtr reported patient had been progressing over the last few weeks prior to kidney stone    Precautions  Comments: Thickened liquids    Bed Mobility/Transfer  Bed Mobility: Supine to Sit: Moderate Assist;Use of Rail;Requires Extra Time;Safety Considerations;Assist with Trunk  Bed Mobility: Sit to Supine: Moderate Assist;x2 People;Requires Extra Time;Safety Considerations;Assist with Trunk;Assist with B LE  Transfer Type: Sit to/from Stand  Transfer: Assistance Level: To/From;Bed;Minimal Assist  Transfer: Assistive Device: Nurse, adult  Transfers: Type Of Assistance: For Strength Deficit;For Balance;Requires Extra Time  Other Transfer Type: Sit to/from Stand  Other Transfer: Assistance Level: To/From;Bed Side Chair;Moderate Assist  Other Transfer: Assistive Device: Hand Hold Assist  Other Transfer: Type Of Assistance: For Safety Considerations;Requires Extra Time;For Balance;For Strength Deficit  End Of Activity Status: In Bed;Instructed Patient to Request Assist with Mobility;Nursing Notified;Instructed Patient to Use Call Light  Comments: Pt able to stand with walker and took a few steps to chair with decreased cadenec and step length. Per daughter not moving as well as yesterday and states he has been orthostatic before. BP assessed and positiove orthostatics. Pt returned to bed side with stand pivot transfer.    Orthostatic vital signs   Position Heart rate Blood pressure Symptomatic (Y/N)   Supine 79 158/87 n   Seated 77 147/76    Standing 79 105/65 y        Balance  Sitting Balance: Static Sitting Balance;Standby Assist  Standing Balance: Static Standing Balance;2 UE support;Moderate Assist    Gait  Gait Distance: 5 feet  Gait: Assistance Level: Moderate Assist  Gait: Assistive Device: Technical brewer: Descriptors: Decreased heel strike RLE;Decreased heel strike LLE;Forward trunk flexion;Pace: Slow;Step-To Gait      Activity/Exercise  Sit Edge Of Bed: 5 minutes  Sit Edge Of Bed Assist: Stand By Assist  Stand At Bedside : 2 minutes  Stand At Bedside Assist: Moderate  Assist    Education  Persons Educated: Patient/Family  Patient Barriers To Learning:  Confusion  Interventions: Repetition of Instructions;Family Education  Teaching Methods: Verbal Instruction  Patient Response: Verbalized Understanding  Topics: Plan/Goals of PT Interventions    Assessment/Progress  Impaired Mobility Due To: Decreased Strength;Decreased ROM;Decreased Activity Tolerance  Impaired Strength Due To: Decreased Activity Tolerance;Decreased Level of Alertness  Assessment/Progress: Should Improve w/ Continued PT    AM-PAC 6 Clicks Basic Mobility Inpatient  Turning from your back to your side while in a flat bed without using bed rails: A Little  Moving from lying on your back to sitting on the side of a flat bed without using bedrails : A Little  Moving to and from a bed to a chair (including a wheelchair): A Lot  Standing up from a chair using your arms (e.g. wheelchair, or bedside chair): A Lot  To walk in hospital room: A Lot  Climbing 3-5 steps with a railing: Total  Basic Mobility Inpatient Raw Score: 13  Standardized (T-scale) Score: 33.99  AM-PAC Basic Mobility Functional Stage: -11.95-33 Limited Movement  Mobility Goal [facility]: JH-HLM 4 Move to Chair    Goals  Goal Formulation: With Patient/Family  Time For Goal Achievement: 3 days  Patient Will Go Supine To/From Sit: w/ Stand By Assist  Patient Will Transfer Bed/Chair: w/ Stand By Assist  Patient Will Transfer Sit to Stand: w/ Stand By Assist  Patient Will Ambulate: 31-50 Feet, w/ Vannie, w/ Stand By Assist    Plan  Treatment Interventions: Mobility training;Strengthening;Endurance training  Plan Frequency: 3-5 Days per Week  PT Plan for Next Visit: Monitor BP, progress gait as able    PT Discharge Recommendations  Recommendation: Inpatient setting      Therapist  Rocky Piety, PT  Date  07/29/2023

## 2023-07-29 NOTE — Progress Notes
 [facility] 62 END OF SHIFT NOTE    Nursing Shift: Day Shift 0700-1900    Admission Date: 07/25/2023    Acute events, interventions, and provider communication: patient Bp 192/79, Dr. Danell, Lyanne T, DO notified @0801 . Gave PRN labetalol  and scheduled carvedilol . Recheck Bp 178/79, Dr. Dr. Danell, Lyanne T, DO notified @0954 . No new orders this time.   Patient BP 187/86 gave PRN hydralazine , recheck 174/84, gave labetalol  PRN, recheck BP 186/89, notified Lebron Sharper, MD @1815 . Gave hydralazine  per Dr. Lebron. Recheck BP 156/61    Patient Goal(s)  Patient will display signs of effective airway clearance by absence of dyspnea and respiratory rate within normal limits by the end of next shift.        Patient will verbalize readiness for discharge by discharge.   Heart Failure Shift Summary    Admission Weight: Weight: 69.9 kg (154 lb)    Last 3 Weights:   Vitals:    07/27/23 1245 07/29/23 0414 07/29/23 0532   Weight: 70.2 kg (154 lb 12.2 oz) 70.4 kg (155 lb 3.3 oz) 70.4 kg (155 lb 3.3 oz)     Weight Change: Weight trend stable    Intake/Output Summary (Last 24 hours) at 07/29/2023 1915  Last data filed at 07/29/2023 1539  Gross per 24 hour   Intake 270 ml   Output 950 ml   Net -680 ml     Last Bowel Movement Date: 07/28/23 (PTA)    Fluid Restriction? No   Quality/Safety    Total Fall Risk Score: 18   Risk for Injury related to falls: Coagulopathies/risk for bleed  Fall Risk Category:   History of More Than One Fall Within 6 Months Before Admission: No  Elimination, Bowel and Urine: Incontinence OR urgency OR frequency  Interventions: Bladder management device utilized (Purewick, Quivy, urinal), Bedside commode readily available, and Bedpan available in room  Medications: On 2 or more high fall risk drugs  Interventions: Educate patient on medication side effects, Bed/chair alarm in place and turned on (impulsive, forgetful, etc.), Remain within arm's reach while ambulating (orthostasis, dizziness), Remain within arm's reach while toileting (orthostasis, dizziness), Bedside commode readily available at the bedside (urgency, frequency, dizziness), and Gait belt utilized while ambulating  Patient Care Equipment: One present  Interventions: Ensure environment is free of clutter and tripping hazards  Mobility: 2 - Assistance required, 2 - Unsteady gait, 2 - Visual or auditory impairment affecting mobility  Interventions: Patient educated over how their mobility status places them at risk for falls, Requires additional staff to assist with mobilizing, Gait belt required while mobilizing , Patient requires staff within arm's reach while ambulating, Patient requires staff within arm's reach while toileting, and PT/OT consulted (weakness, unsteady gait, etc.)  Cognition: 1 - Altered awareness of immediate physical environment  Interventions: Bed and chair alarms in place (REQUIRED) and Staff remains within arm's reach while ambulating AND toileting (REQUIRED)    Other safety precautions in place: HAPI Prevention Bundle and Aspiration Precautions    Restraints: No       CBHP Patient: No   Patient Education  This RN provided education to Patient today. They were inappropriate to counsel at this time. and had no evidence of understanding/learning. .  Quality/Safety Education:   Fall risk, Pressure injury prevention/care, and VTE prophylaxis  Medication Education:   Medication management (Indication, adverse effects, monitoring, etc)  Education provided on the following medication(s): see MAR  Continue to address: indications  General Education:   Diet/nutrition and Mobility/Activity intolerance  The following teaching method(s) were used: Verbal  Follow-up should occur: daily.

## 2023-07-29 NOTE — Consults
 PALLIATIVE CARE INPATIENT NOTE     Name: Javier Gutierrez            MRN: [id number]                DOB: Sep 18, 1940          Age: 83 y.o.  Admission Date: 07/25/2023             LOS: 2 days    ASSESSMENT/PLAN     Javier Gutierrez is a 83 y.o. male who was brought to the ED by family with report of right sided abdominal pain, found to have acute hypoxic respiratory failure likely secondary to ongoing aspiration. Palliative care asked to help with complex medical decision making.    #acute hypoxic respiratory failure 2/2 pneumonia   #dysphagia - oropharyngeal  #renal calculus in right solitary functioning kidney  S/P cysto and right ureteral stent  #Goals of care    Discussion:  Met with patient and his daughter Javier Gutierrez (DPOA) who is at bedside at time of our visit today. Reviewed role of palliative care in patient's ongoing medical care - symptom management and making sure they are getting all the information they need to make good decisions about care moving forward.    Patient able to respond to some simple questions but not able to make complex medical decisions. Today at time of visit he is not able to tell me he is in the hospital, nor does he recall the reason he came here.  He is however, quite pleasant and is able to interact on a superficial level. Daughter Javier Gutierrez tells us  he is not doing as well as he normally is at the time of our visit.      Understanding of illness:  Javier Gutierrez has a good grasp of her father's medical conditions.  What she is struggling with is knowing when is the right time to make the decision to focus on comfort and get hospice involved in his care.  After his last admission in May they had hospice come out and evaluate him and at that time he did not qualify for hospice because he had improved after discharge.     She is aware that his swallow is only likely to get worse, she tells us  he has told her he does not want a feeding tube, and also that he says he does not want to continue to come to the hospital.  For her the issue is that he has a lot of good days at home. She describes his quality of life as one that he seems to enjoy, on most days he recognizes people, he is able to interact with them, he likes to drink mountain dew and eat candy, he likes to watch the Royals and likes looking af flowers. He is able to get up independently with stand by assistance.     We discussed that these decisions are difficult and that her dad has let her know some important things already. Will wait for video swallow results to come back, though she admits that she knows it will likely be worse and also that it is not going to get better.    We let her know that she could decide to take her dad home and see how much good time he gets after this hospitalization and then make future decisions based on that, but we also let her know as well that it was absolutely ok to make the decision now to get hospice  involved and stop coming back and forth to the hospital as this would honor what her dad has told her in the past. She just wants to do the BEST thing for her dad.  We reminded her that her Dad likely already knows that she knows what he wants and wants her to be at peace with things, and this is why he named her his DPOA.      Encouraged Javier Gutierrez to talk to urology about how they would want to handle the ureteral stent in the event that patient decides to go home with a focus on comfort and get hospice involved.     If decision made to go home with a focus on comfort would want to get hospice referral sent and in place before he goes home, would also need to get an OSH DNR or TPOPP done to go home with them. Will plan to follow up tomorrow 07/30/23.    Please also see ACP note completed today.    Niels Danas APRN, [facility]  Palliative Care CNS  Preferred communication via Voalte  Office:  [phone]  Nights/Weekends - Page [phone] for Palliative Care On-Call    Note created with voice dictation software, please excuse grammatical and typographical errors.    Moderate medical decision making related to:  1 or more chronic illness with exacerbation    Dysphagia with aspiration pneumonia + new kidney stone   Decision made not to resuscitate because of poor prognosis   Review of SLP notes/discussion regarding dysphagia      PALLIATIVE CARE PLANNING     Advance Care Planning:   Identified Health Care Decision Maker:    DPOA - Has DPOA, status: completed, see scanned document  TPOPP - Introduced, patient (or surrogate) considering - they will want to do one.     PC Clinic - NA    Medication safety - NA    Disposition planning: too soon to know.  Likely return home, possibly with hospice.        SUBJECTIVE     CC/Reason for Visit:Goals of care related to multifocal pneumonia likely secondary to ongoing dysphagia in the setting of previous CVA with aphasia as well as renal calculus.     Additional history from: Review of EMR,communicated with Shravan Nadella, DO.     History of Present Illness: 83 yo male admitted with multifocal pneumonia likely secondary to aspiration related to ongoing dysphagia. Also with right kidney stone status post cysto and ureteral stent for stone in functionally solitary kidney.  He was having abdominal pain that prompted visit to the ED, but his is not present any longer, he denies pain, he denies nausea, denies shortness of breath. He is weak in appearance. He is pleasant but unable to participate in high level decision making.       Past Medical History:    Acute on chronic systolic heart failure, NYHA class 2 (CMS-HCC)    Amyloidosis (CMS-HCC)    Aortic valve stenosis, mild    Arrhythmia    Arterial occlusion    Arthritis    Asthma    CAD (coronary artery disease)    Cardiomyopathy (CMS-HCC)    Carotid artery plaque    Cataract    CHF (congestive heart failure) (CMS-HCC)    Chronic kidney disease    Colon polyps    COPD (chronic obstructive pulmonary disease) (CMS-HCC)    Coronary atherosclerosis    Dizziness    Dyspnea    Frailty  H/O: CVA (cardiovascular accident)    History of renal artery stenosis    HTN    Hyperlipidemia    Hyperlipidemia    Hypertension    Hypothyroidism    Kidney disease    Kidney stones    Lung disease    Peripheral vascular disease    Tobacco abuse    Valvular heart disease     Surgical History:   Procedure Laterality Date    HX LUMBAR DISKECTOMY  01/22/1966    30 years ago    STENT INTRAVASCULAR  01/23/2004    kidney stent placed    Left Heart Catheterization With Ventriculogram Left 03/11/2015    Performed by Lannis Maude SAILOR, MD, [facility] at [facility] CATH LAB    Coronary Angiography N/A 03/11/2015    Performed by Lannis Maude SAILOR, MD, [facility] at [facility] CATH LAB    Possible Percutaneous Coronary Intervention N/A 03/11/2015    Performed by Lannis Maude SAILOR, MD, [facility] at [facility] CATH LAB    ESOPHAGOGASTRODUODENOSCOPY N/A 04/26/2015    Performed by Leonce Emerick PARAS, DO at [facility] ENDO    ESOPHAGOGASTRODUODENOSCOPY BIOPSY  04/26/2015    Performed by Leonce Emerick PARAS, DO at [facility] ENDO    REPLACEMENT TRANSCATHETER AORTIC VALVE (Sapien 26s3), right common femoral artery approach N/A 08/03/2015    Performed by Tony Cordella FALCON, MD at [facility] CVOR    BRONCHOSCOPY Bilateral 11/30/2016    Performed by Shereen Rigg, MD at [facility] OR    BRONCHOSCOPY WITH BRONCHIAL ALVEOLAR LAVAGE - FLEXIBLE Right 11/30/2016    Performed by Shereen Rigg, MD at [facility] OR    ESOPHAGOGASTRODUODENOSCOPY for PEG placement N/A 12/06/2016    Performed by Buckles, Toribio BROCKS, MD at [facility] ENDO    ENDARTERECTOMY CAROTID ARTERY Left 02/17/2018    Performed by Idelia Alverna BROCKS, MD at [facility] OR    CYSTOURETHROSCOPY WITH URETEROSCOPY AND/ OR PYELOSCOPY - WITH REMOVAL/ MANIPULATION CALCULUS Right 07/26/2023    Performed by Jama Sherwood POUR, MD at [facility] OR    CYSTOURETHROSCOPY WITH INDWELLING URETERAL STENT INSERTION Right 07/26/2023    Performed by Jama Sherwood POUR, MD at [facility] OR    RETROGRADE UROGRAPHY WITH/ WITHOUT KUB Right 07/26/2023    Performed by Jama Sherwood POUR, MD at [facility] OR    ANOMALOUS VENOUS RETURN REPAIR  2017    ECHOCARDIOGRAM PROCEDURE  05/2022    HX CATARACT REMOVAL  2018    HX HEART CATHETERIZATION  2015    KIDNEY STONE SURGERY  2025     Social History     Tobacco Use    Smoking status: Former     Current packs/day: 0.00     Average packs/day: 1.5 packs/day for 60.0 years (90.0 ttl pk-yrs)     Types: Cigarettes     Start date: 02/13/1958     Quit date: 02/13/2018     Years since quitting: 5.4    Smokeless tobacco: Never    Tobacco comments:     0.5-2 ppd history   Vaping Use    Vaping status: Never Used   Substance Use Topics    Alcohol use: Not Currently    Drug use: Never     Social History     Social History Narrative    Not on file     Occupation: retired  Presenter, broadcasting or other: likes to watch Royals on TV, likes to look at flowers, likes to spend time with family.   Living situation: living at home with daughter  Marital  status: widow  Significant loved ones: Children       Family History:   Family History   Problem Relation Name Age of Onset    Stroke Mother Virginia  Mcnerney     Other Mother Virginia  Bagby         colon cancer    Other Father          valve disease/COPD    Aortic Disease/Dissection Father      Cancer Mother Virginia  Balestrieri         Colon    Heart Disease Father Yadriel Kerrigan      Family Status   Relation Name Status    Mother Virginia  Smiles Deceased    Father Yandriel Boening Deceased   No partnership data on file       ROS: Review of Systems   Constitutional:  Positive for malaise/fatigue.   Neurological:  Positive for speech change and weakness.   Psychiatric/Behavioral:  Positive for memory loss.            OBJECTIVE     Blood pressure 105/65, pulse 73, temperature 36.3 ?C (97.3 ?F), height 177.8 cm (5' 10), weight 70.4 kg (155 lb 3.3 oz), SpO2 94%.  Physical Exam  Constitutional:       Comments: Chronically ill appearing older male.   HENT:      Head: Normocephalic.      Nose: Nose normal.      Mouth/Throat:      Mouth: Mucous membranes are moist.   Eyes: Extraocular Movements: Extraocular movements intact.      Pupils: Pupils are equal, round, and reactive to light.   Cardiovascular:      Rate and Rhythm: Normal rate and regular rhythm.      Pulses: Normal pulses.   Pulmonary:      Effort: Pulmonary effort is normal.   Abdominal:      General: Bowel sounds are normal. There is no distension.      Palpations: Abdomen is soft.      Tenderness: There is no abdominal tenderness.   Musculoskeletal:      Right lower leg: No edema.      Left lower leg: No edema.   Skin:     General: Skin is warm and dry.      Coloration: Skin is pale.   Neurological:      Mental Status: Mental status is at baseline.   Psychiatric:         Behavior: Behavior normal.         Lab Results:  CBC   Lab Results   Component Value Date/Time    WBC 6.60 07/29/2023 05:59 AM    HGB 11.1 (L) 07/29/2023 05:59 AM    PLTCT 270 07/29/2023 05:59 AM     Lab Results   Component Value Date/Time    NEUT 64.3 07/29/2023 05:59 AM    ANC 4.20 07/29/2023 05:59 AM      Chemistries   Lab Results   Component Value Date/Time    NA 148 (H) 07/29/2023 05:59 AM    K 3.5 07/29/2023 05:59 AM    BUN 34 (H) 07/29/2023 05:59 AM    CR 1.42 (H) 07/29/2023 05:59 AM    GLU 117 (H) 07/29/2023 05:59 AM     Lab Results   Component Value Date/Time    CA 8.6 07/29/2023 05:59 AM    PO4 2.1 03/12/2023 05:49 AM    ALBUMIN  2.5 (L) 07/29/2023 05:59 AM  TOTPROT 5.7 (L) 07/29/2023 05:59 AM    ALKPHOS 40 07/29/2023 05:59 AM    AST 9 07/29/2023 05:59 AM    ALT 8 07/29/2023 05:59 AM    TOTBILI 0.4 07/29/2023 05:59 AM    GFR 49 (L) 07/29/2023 05:59 AM    GFRAA 48 (L) 06/29/2018 06:53 AM        Other Pertinent Diagnostic Results:   07/27/23 CT chest without contrast:  IMPRESSION   Increased lower lobe consolidations with other areas of faint tree-in-bud nodularity, likely multifocal pneumonia on the basis of aspiration.   Similar multifocal central and peripheral bronchiectasis, likely sequela of chronic infection or aspiration.   Small pleural effusions.     07/25/23 CT abdomen/pelvis with contrast: IMPRESSION   1. 3 mm right mid ureteral calculus, with minimal upstream hydroureteronephrosis.   2. Additional nonobstructing bilateral renal calculi. No left hydronephrosis.   3. Mild bladder wall thickening, which may be secondary to chronic outlet obstruction; though cystitis could appear similar. Correlation with urinalysis is recommended.   4.  Trace pleural effusions. Adjacent patchy opacities are likely atelectasis, though infection and/or aspiration could contribute.   5.  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   6.  Cholelithiasis.       Palliative Care Data  Patient Location at Time of Consultation: Hospital - General floor (includes step-down,  pre-op )  Primary Diagnosis: Infectious: Pneumonia likely secondary to ongoing aspiration

## 2023-07-29 NOTE — Progress Notes
 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  Who we are consulted for heart failure exacerbation.    Assessment:  Acute on chronic systolic/diastolic HF MR EF  Right renal calculus with hydronephrosis  Orthostatic hypotension  Paroxysmal atrial fibrillation  TAVR  CKD 3  TAVR  Right renal artery stenosis and previous stenting  Left atrophic kidney  Recurrent pneumonia  Dysphagia  CVA  Carotid stenosis with left CEA and carotid stent    Recommendations:  He has been fairly hypertensive for past day, metoprolol  transitioned to carvedilol  6.125 mg twice daily today.  Will continue to monitor cautiously given his history of fluctuations.  Previously on low-dose of lisinopril  which we can consider reintroducing next as kidney function continues to improve.  Further GDMT limited by history of orthostatic hypotension.  Continue reduced dose Eliquis  2.5 mg twice daily and Plavix  daily.  Continue atorvastatin  20 mg daily.  He has intermittently required oxygen but likely multifactorial related to issues with dysphagia/aspiration as well as his history of heart failure.  Has required spot dosing of Lasix  during this admission but appears euvolemic at this time.  Continue to maintain even to -1 L.    Thank you for the opportunity to participate in the care of Javier Gutierrez. The above plan was discussed with the primary team - Dr. Danell. Please do not hesitate to reach out with questions or concerns, cardiology will sign off.    Donnice Fenton, DO  Department of Cardiovascular Medicine  _____________________________________________________________________________    Subjective:   Javier Gutierrez is a 83 y.o. male.   No overnight events. No new symptomatic concerns.     Objective:  Scheduled Meds:albuterol -ipratropium (DUONEB) nebulizer solution 3 mL, 3 mL, Inhalation, TID & PRN  apixaban  (ELIQUIS ) tablet 2.5 mg, 2.5 mg, Oral, BID  arformoteroL  (BROVANA ) nebulizer solution 15 mcg, 15 mcg, Inhalation, BID  atorvastatin  (LIPITOR) tablet 20 mg, 20 mg, Oral, QDAY  budesonide  (PULMICORT ) nebulizer solution 0.5 mg, 0.5 mg, Inhalation, BID  carvediloL  (COREG ) tablet 6.25 mg, 6.25 mg, Oral, BID  clopiDOGreL  (PLAVIX ) tablet 75 mg, 75 mg, Oral, QDAY  dutasteride  (AVODART ) capsule 0.5 mg, 0.5 mg, Oral, QDAY  levothyroxine  (SYNTHROID ) tablet 88 mcg, 88 mcg, Oral, QDAY(07)  mirabegron  (MYRBETRIQ ) ER tablet 25 mg, 25 mg, Oral, QDAY  montelukast  (SINGULAIR ) tablet 10 mg, 10  mg, Oral, QHS  pantoprazole  DR (PROTONIX ) tablet 40 mg, 40 mg, Oral, QDAY  piperacillin /tazobactam (ZOSYN ) 4.5 g in sodium chloride  0.9% (NS) 100 mL IVPB (MB+), 4.5 g, Intravenous, Q8H*  sodium chloride  (NEBUSAL) 3 % nebulizer solution 4 mL, 4 mL, Inhalation, BID    Continuous Infusions:  PRN and Respiratory Meds:albuterol  sulfate PRN, guaiFENesin   (ROBITUSSIN) oral solution Q4H PRN, hydrALAZINE  Q8H PRN, hyoscyamine  Q4H PRN, labetalol  (NORMODYNE ; TRANDATE ) injection Q4H PRN, melatonin QHS PRN, polyethylene glycol 3350  QDAY PRN, sennosides-docusate sodium  QDAY PRN                        Vital Signs:  Last Filed                Vital Signs: 24 Hour Range   BP: 172/87 (07/07 0750)  Temp: 36.4 ?C (97.6 ?F) (07/07 0730)  Pulse: 85 (07/07 0750)  Respirations: 18 PER MINUTE (07/07 0730)  SpO2: 98 % (07/07 0730)  O2 Device: Oxymask (07/07 0414)  O2 Liter Flow: 2 Lpm (07/07 0414)  BP: (145-192)/(58-94)   Temp:  [36.4 ?C (97.6 ?F)-36.8 ?C (98.3 ?F)]   Pulse:  [83-91]   Respirations:  [16 PER MINUTE-20 PER MINUTE]   SpO2:  [86 %-98 %]   O2 Device: Oxymask  O2 Liter Flow: 2 Lpm    Intensity Pain Scale (Self Report): (not recorded) Vitals:    07/27/23 1245 07/29/23 0414 07/29/23 0532   Weight: 70.2 kg (154 lb 12.2 oz) 70.4 kg (155 lb 3.3 oz) 70.4 kg (155 lb 3.3 oz)       Intake/Output Summary: (Last 24 hours)    Intake/Output Summary (Last 24 hours) at 07/29/2023 0756  Last data filed at 07/29/2023 0414  Gross per 24 hour   Intake 330 ml   Output 550 ml   Net -220 ml         Physical Exam  Constitutional:       General: He is not in acute distress.  Eyes:      Extraocular Movements: Extraocular movements intact.   Cardiovascular:      Rate and Rhythm: Normal rate and regular rhythm.   Pulmonary:      Effort: Pulmonary effort is normal.   Abdominal:      Palpations: Abdomen is soft.   Musculoskeletal:      Right lower leg: No edema.      Left lower leg: No edema.   Skin:     General: Skin is warm and dry.   Neurological:      General: No focal deficit present.   Psychiatric:         Mood and Affect: Mood normal.          Cardiographics:     ECG: From 7/4 with normal sinus rhythm with PACs, right bundle branch block, left anterior fascicular block, bifascicular block.    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    Lab/Other Diagnostic Tests:  Pertinent labs reviewed.    Radiology/Other Diagnostics:     Pertinent radiology reviewed.

## 2023-07-29 NOTE — Progress Notes
 Float Pool END OF SHIFT/ JHFRAT NOTE    Admission Date: 07/25/2023    Acute events, interventions, provider communication: N/A    Patient Interventions and Education  Fall Risk/JHFRAT Interventions and Education: (Charting when applicable)  Elimination Interventions : Commode at bedside, Bed pan available in room, and Use of bladder management device (e.g., male/male external urinary containment device)   Medications : Bedside commode (i.e., urgency, frequency, dizziness) , Use of gait belt , Stay within arm's reach during toileting/showering (i.e., dizziness, orthostasis) , Educate patient on medication side effects, and Bed/chair alarm (i.e., change in mental status)   Patient Care Equipment: Needs assistance with patient care equipment when ambulating, Ensure environment is free of clutter and walkways are clear from tripping hazards, and Assess need for patient equipment and remove if not in use  Mobility: Assist x1, Assist x2, Gait belt in use when ambulating, Utilize walker, cane, or additional walking aid for ambulation, and Elimination equipment at bedside (urinal or commode)   Cognition: Bed/Chair Alarm , Stay within arm's reach while patient ambulating/toileting/showering, and Increase frequency of purposeful rounding  Risk for Moderate/Major Injury: Age: >65 yrs and Active Anticoagulation    2. Restraints:  No     Restraint Goal: Patient will be free from injury while physically restrained.  See Docflowsheet for restraint documentation, interventions, education, etc.    Intake and Output:       Intake/Output Summary (Last 24 hours) at 07/29/2023 0702  Last data filed at 07/29/2023 0414  Gross per 24 hour   Intake 330 ml   Output 550 ml   Net -220 ml              Last Bowel Movement Date: 07/28/23

## 2023-07-29 NOTE — Care Plan
 Problem: Discharge Planning  Goal: Participation in plan of care  Outcome: Goal Ongoing  Flowsheets (Taken 07/28/2023 1559)  Participation in Plan of Care: Involve patient/caregiver in care planning decision making  Goal: Knowledge regarding plan of care  Outcome: Goal Ongoing  Flowsheets (Taken 07/28/2023 1559)  Knowledge regarding plan of care: Provide admission education to parent/caregiver  Goal: Prepared for discharge  Outcome: Goal Ongoing  Flowsheets (Taken 07/29/2023 1358)  Prepared for discharge:   Complete ADL ability assessment   Collaborate with multidisciplinary team for hospital discharge coordination   Provide safe use medical equipment education   Provide diet and oral health education     Problem: Skin Integrity  Goal: Skin integrity intact  Outcome: Goal Ongoing  Flowsheets (Taken 07/28/2023 1559)  Skin integrity intact:   Assess nutrition   Promote nutrition   Assure position change   Monitor skin integrity  Goal: Healing of skin (Wound & Incision)  Outcome: Goal Ongoing  Flowsheets (Taken 07/28/2023 1559)  Healing of wound (wounds and Incisions): Assess for signs and symptoms of wound infection  Goal: Healing of skin (Pressure Injury)  Outcome: Goal Ongoing  Flowsheets (Taken 07/28/2023 1559)  Healing of skin (Pressure Injury): Assess for signs and symptoms of pressure injury infection     Problem: High Fall Risk  Goal: High Fall Risk  Outcome: Goal Ongoing  Flowsheets (Taken 07/28/2023 1559)  High Fall Risk:   All patients will receive: High fall risk sign, yellow wristband, yellow socks, gait belt, and shower shoes   PT/OT consult for fall prevention assessment if scoring in Unsteady Gait or Visual or Auditory impairment   Engage bed alarm - middle setting   Stay with the patient while toileting/showering   Engage chair alarm   Remove excess equipment/supplies   Educate patient to use call-light if tethered   Use shower shoes   Maximize bed functionality (optimize bed height, firm/flat surface)   Assess need for bedside commode with drop arm to be available in room   Use safe patient handling equipment as appropriate     Problem: Glucose Management  Goal: Absence of hyperglycemia  Outcome: Goal Ongoing  Goal: Absence of Hypoglycemia  Outcome: Goal Ongoing  Goal: Glucose level within specified parameters  Outcome: Goal Ongoing     Problem: Self-Care Deficit  Goal: Maximize Heart Failure Knowledge  Outcome: Goal Ongoing  Flowsheets (Taken 07/29/2023 1358)  Maximize heart failure knowledge: Provide heart failure education on: disease process,  sign/symptoms, weight monitoring  Goal: Maximize Heart Failure Attitudes  Outcome: Goal Ongoing  Flowsheets (Taken 07/29/2023 1358)  Maximize Heart failure Attitudes: weight monitoring, low sodium diet, fluid restriciton, medications, activity  Goal: Maximize Heart Failure Behaviors  Outcome: Goal Ongoing     Problem: Aspiration, Risk of  Goal: Tolerates oral intake  Outcome: Goal Ongoing  Flowsheets (Taken 07/29/2023 1358)  Tolerates Oral Intake:   Speech Therapy swallowing assessment and management   Manage oral intake  Goal: Absence of aspiration  Outcome: Goal Ongoing  Flowsheets (Taken 07/29/2023 1358)  Absence of aspiration:   Assess for signs and symptoms of aspiration   Assess cough ability   Provide education on aspiration prevention   Elevate head of bed

## 2023-07-30 ENCOUNTER — Inpatient Hospital Stay: Admit: 2023-07-30 | Discharge: 2023-07-30 | Payer: MEDICARE

## 2023-07-30 LAB — TSH WITH FREE T4 REFLEX: ~~LOC~~ BKR TSH: 4.8 [IU]/mL (ref 0.35–5.00)

## 2023-07-30 MED ORDER — BENZONATATE 100 MG PO CAP
100 mg | Freq: Three times a day (TID) | ORAL | 0 refills | Status: DC | PRN
Start: 2023-07-30 — End: 2023-08-01

## 2023-07-30 MED ORDER — BARIUM SULFATE 81 % (W/W) PO POWD
10 mL | Freq: Once | ORAL | 0 refills | Status: CP
Start: 2023-07-30 — End: ?
  Administered 2023-07-30: 15:00:00 20 mL via ORAL

## 2023-07-30 MED ORDER — BARIUM SULFATE 40 % (W/V), 30% (W/W) PO PSTE
10 mL | Freq: Once | ORAL | 0 refills | Status: CP
Start: 2023-07-30 — End: ?
  Administered 2023-07-30: 15:00:00 15 mL via ORAL

## 2023-07-30 MED ORDER — HYDRALAZINE 10 MG PO TAB
20 mg | Freq: Once | ORAL | 0 refills | Status: CP
Start: 2023-07-30 — End: ?
  Administered 2023-07-30: 12:00:00 20 mg via ORAL

## 2023-07-30 MED ORDER — BARIUM SULFATE 40 % (W/V) 29% (W/W) PO SUSP
10 mL | Freq: Once | ORAL | 0 refills | Status: CP
Start: 2023-07-30 — End: ?
  Administered 2023-07-30: 15:00:00 20 mL via ORAL

## 2023-07-30 MED ORDER — AMLODIPINE 5 MG PO TAB
5 mg | Freq: Every day | ORAL | 0 refills | Status: DC
Start: 2023-07-30 — End: 2023-08-01
  Administered 2023-07-30 – 2023-08-01 (×3): 5 mg via ORAL

## 2023-07-30 MED ORDER — CARVEDILOL 12.5 MG PO TAB
12.5 mg | Freq: Two times a day (BID) | ORAL | 0 refills | Status: DC
Start: 2023-07-30 — End: 2023-08-01
  Administered 2023-07-31 – 2023-08-01 (×4): 12.5 mg via ORAL

## 2023-07-30 MED ORDER — BARIUM SULFATE 40 % (W/V) PO SUSP
10 mL | Freq: Once | ORAL | 0 refills | Status: CP
Start: 2023-07-30 — End: ?
  Administered 2023-07-30: 15:00:00 30 mL via ORAL

## 2023-07-30 MED ORDER — CARVEDILOL 6.25 MG PO TAB
6.25 mg | Freq: Two times a day (BID) | ORAL | 0 refills | Status: DC
Start: 2023-07-30 — End: 2023-07-30
  Administered 2023-07-30: 12:00:00 6.25 mg via ORAL

## 2023-07-30 NOTE — Progress Notes
 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  Chaplain visited to offer prayer, support and sacramental care.   Patient's daughter was present at bedside.  Patient appeared calm and hopeful but concerned about his health and asked for prayer and sacramental care for healing.   Chaplain offered active listening, support, prayer and provided the Sacrament of the Sick / Last Rites for peace, hope and healing.  Patient appreciated for visit.  Continue available for support and prayer.     The spiritual care team is available as needed, 24/7, through the campus switchboard [phone]). For a response within 24 hours, please submit an order in O2 for a chaplain consult.

## 2023-07-30 NOTE — Care Plan
 Problem: Discharge Planning  Goal: Participation in plan of care  Outcome: Goal Ongoing  Goal: Knowledge regarding plan of care  Outcome: Goal Ongoing  Goal: Prepared for discharge  Outcome: Goal Ongoing     Problem: Skin Integrity  Goal: Skin integrity intact  Outcome: Goal Ongoing  Goal: Healing of skin (Wound & Incision)  Outcome: Goal Ongoing  Goal: Healing of skin (Pressure Injury)  Outcome: Goal Ongoing     Problem: High Fall Risk  Goal: High Fall Risk  Outcome: Goal Ongoing     Problem: Glucose Management  Goal: Absence of hyperglycemia  Outcome: Goal Ongoing  Goal: Absence of Hypoglycemia  Outcome: Goal Ongoing  Goal: Glucose level within specified parameters  Outcome: Goal Ongoing     Problem: Self-Care Deficit  Goal: Maximize Heart Failure Knowledge  Outcome: Goal Ongoing  Goal: Maximize Heart Failure Attitudes  Outcome: Goal Ongoing  Goal: Maximize Heart Failure Behaviors  Outcome: Goal Ongoing     Problem: Aspiration, Risk of  Goal: Tolerates oral intake  Outcome: Goal Ongoing  Goal: Absence of aspiration  Outcome: Goal Ongoing

## 2023-07-30 NOTE — Consults
 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  GI consulted for dysphagia from esophageal origin.    Patient was seen at bedside this afternoon along with his daughter.  Per them, he has been having swallowing difficulty has been worsening over the past few months now.  He initially had dysphagia in 2018 for which he got temporary PEG tube placed.  He was overall doing well but has been slowly declining clinically over the past few months since his pneumonia.  Per daughter, patient has had difficulty swallowing solid foods mainly but occasionally chokes/gags on liquids 2.  Speech attempted to evaluate on 7/5 but patient was too drowsy.  Palliative care has been consulted patient is now planned to go to home on home hospice.  However, family was wanting to see if we can improve his swallowing issues prior to going home.  Patient is on Eliquis  and Plavix .    Assessment/ Plan:  Javier Gutierrez is a 83 y.o. 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  GI consulted for dysphagia from esophageal origin.    Differential includes neurologic vs structural/mechanical (zenker's, schatzki ring, esophageal carcinoma)vs inflammatory/infectious (Candida, HSV, CMV, EoE esophagitis, medication induced, radiation) vs motility issues (achalasia, DES. Scleroderma).      Recommendations:  - Plan for Diagnostic EGD tomorrow.  - Transition to clear liquid diet starting today.  - Avoid NSAIDs. Ok to continue plavix  and eliquis  given significant co-morbidities.  - Rest of the care per primary team.      Patient seen/discussed with Dr. Verlon Beverlee Gibbons, MBBS  Available on [facility]  Fellow, Gastroenterology and Hepatology     07/30/2023 6:21 PM       -----------------------------  PMH:  Past Medical History:    Acute on chronic systolic heart failure, NYHA class 2 (CMS-HCC)    Amyloidosis (CMS-HCC)    Aortic valve stenosis, mild    Arrhythmia    Arterial occlusion    Arthritis    Asthma    CAD (coronary artery disease)    Cardiomyopathy (CMS-HCC)    Carotid artery plaque    Cataract    CHF (congestive heart failure) (CMS-HCC)    Chronic kidney disease    Colon polyps    COPD (chronic obstructive pulmonary disease) (CMS-HCC)  Coronary atherosclerosis    Dizziness    Dyspnea    Frailty    H/O: CVA (cardiovascular accident)    History of renal artery stenosis    HTN    Hyperlipidemia    Hyperlipidemia    Hypertension    Hypothyroidism    Kidney disease    Kidney stones    Lung disease    Peripheral vascular disease    Tobacco abuse    Valvular heart disease       Current medications:  No current facility-administered medications on file prior to encounter.     Current Outpatient Medications on File Prior to Encounter   Medication Sig Dispense Refill    acetaminophen  (TYLENOL  EXTRA STRENGTH) 500 mg tablet Take one tablet by mouth every 6 hours as needed for Pain. Max of 4,000 mg of acetaminophen  in 24 hours.      albuterol -ipratropium (DUONEB) 0.5 mg-3 mg(2.5 mg base)/3 mL nebulizer solution Inhale 3 mL solution by nebulizer as directed four times daily as needed for Wheezing.      apixaban  (ELIQUIS ) 2.5 mg tablet TAKE 1 TABLET BY MOUTH TWO TIMES A DAY 180 tablet 3    atorvastatin  (LIPITOR) 20 mg tablet Take one tablet by mouth daily. 90 tablet 3    budesonide  (PULMICORT ) 0.5 mg/2 mL nebulizer solution Inhale 2 mL solution by nebulizer as directed twice daily.      clopiDOGrel  (PLAVIX ) 75 mg tablet Take one tablet by mouth daily. 30 tablet 1    dutasteride  (AVODART ) 0.5 mg PO capsule Take one capsule by mouth daily.      food supplemt, lactose-reduced (ENSURE PLUS) 0.05 gram- 1.5 kcal/mL oral liquid Take 1 Can by mouth twice daily with meals.      formoterol fumarate (PERFOROMIST) 20 mcg/2 mL nebulizer vial Inhale 2 mL solution by nebulizer as directed twice daily.      levoFLOXacin  (LEVAQUIN ) 500 mg tablet Take one tablet by mouth daily.      levothyroxine  (SYNTHROID ) 88 mcg tablet Take one tablet by mouth daily 30 minutes before breakfast.      metoprolol  succinate XL (TOPROL  XL) 25 mg extended release tablet Take one-half tablet by mouth daily. 45 tablet 3    montelukast  (SINGULAIR ) 10 mg tablet Take one tablet by mouth at bedtime daily. 30 tablet 1    MYRBETRIQ  25 mg tablet Take one tablet by mouth daily.      pantoprazole  DR (PROTONIX ) 40 mg tablet Take one tablet by mouth daily. 30 tablet 1       PSH:  Surgical History:   Procedure Laterality Date    HX LUMBAR DISKECTOMY  01/22/1966    30 years ago    STENT INTRAVASCULAR  01/23/2004    kidney stent placed    Left Heart Catheterization With Ventriculogram Left 03/11/2015    Performed by Lannis Maude SAILOR, MD, [facility] at [facility] CATH LAB    Coronary Angiography N/A 03/11/2015    Performed by Lannis Maude SAILOR, MD, [facility] at [facility] CATH LAB Possible Percutaneous Coronary Intervention N/A 03/11/2015    Performed by Lannis Maude SAILOR, MD, [facility] at [facility] CATH LAB    ESOPHAGOGASTRODUODENOSCOPY N/A 04/26/2015    Performed by Leonce Emerick PARAS, DO at [facility] ENDO    ESOPHAGOGASTRODUODENOSCOPY BIOPSY  04/26/2015    Performed by Leonce Emerick PARAS, DO at [facility] ENDO    REPLACEMENT TRANSCATHETER AORTIC VALVE (Sapien 26s3), right common femoral artery approach N/A 08/03/2015    Performed by Tony Cordella FALCON, MD  at [facility] CVOR    BRONCHOSCOPY Bilateral 11/30/2016    Performed by Shereen Rigg, MD at [facility] OR    BRONCHOSCOPY WITH BRONCHIAL ALVEOLAR LAVAGE - FLEXIBLE Right 11/30/2016    Performed by Shereen Rigg, MD at [facility] OR    ESOPHAGOGASTRODUODENOSCOPY for PEG placement N/A 12/06/2016    Performed by Buckles, Toribio BROCKS, MD at [facility] ENDO    ENDARTERECTOMY CAROTID ARTERY Left 02/17/2018    Performed by Idelia Alverna BROCKS, MD at [facility] OR    CYSTOURETHROSCOPY WITH URETEROSCOPY AND/ OR PYELOSCOPY - WITH REMOVAL/ MANIPULATION CALCULUS Right 07/26/2023    Performed by Jama Sherwood POUR, MD at [facility] OR    CYSTOURETHROSCOPY WITH INDWELLING URETERAL STENT INSERTION Right 07/26/2023    Performed by Jama Sherwood POUR, MD at [facility] OR    RETROGRADE UROGRAPHY WITH/ WITHOUT KUB Right 07/26/2023    Performed by Jama Sherwood POUR, MD at [facility] OR    ANOMALOUS VENOUS RETURN REPAIR  2017    ECHOCARDIOGRAM PROCEDURE  05/2022    HX CATARACT REMOVAL  2018    HX HEART CATHETERIZATION  2015    KIDNEY STONE SURGERY  2025       SH:  Social History     Socioeconomic History    Marital status: Widowed   Tobacco Use    Smoking status: Former     Current packs/day: 0.00     Average packs/day: 1.5 packs/day for 60.0 years (90.0 ttl pk-yrs)     Types: Cigarettes     Start date: 02/13/1958     Quit date: 02/13/2018     Years since quitting: 5.4    Smokeless tobacco: Never    Tobacco comments:     0.5-2 ppd history   Vaping Use    Vaping status: Never Used   Substance and Sexual Activity    Alcohol use: Not Currently    Drug use: Never    Sexual activity: Not Currently     Partners: Female       FH:  Family History   Problem Relation Name Age of Onset    Stroke Mother Virginia  Straub     Other Mother Virginia  Eley         colon cancer    Other Father          valve disease/COPD    Aortic Disease/Dissection Father      Cancer Mother Virginia  Reagor         Colon    Heart Disease Father Vlad Mayberry        Review of Systems:  Constitutional: Denies fevers, chills, weight loss  Eyes: Denies change in vision  Ears, nose, mouth: Denies oral bleeding, ulcer,   Cardiovascular: Denies chest pain, palpitations or lower extremity edema   Respiratory: Denies dyspnea, cough   Gastrointestinal: Denies abdominal pain, blood in stool or changes in bowel habits  Musculoskeltal: Denies joint inflammation, new deformity  Integumentary: Denies rashes or abrasions  Neurologic: Denies cognitive change, focal weakness  Hematologic: Denies easy bleeding or bruising  Please see HPI for additional pertinent documentation    Physical Exam:  Vitals:    07/30/23 1201 07/30/23 1439 07/30/23 1518 07/30/23 1630   BP: (!) 148/64  (!) 196/78 (!) 187/80   BP Source: Arm, Right Upper  Arm, Right Upper    Pulse: 71 69 72 71   Temp: 36.3 ?C (97.4 ?F)  36.4 ?C (97.6 ?F)    SpO2: 96% (!) 91% 95%    O2 Device: None (  Room air) None (Room air) None (Room air)    O2 Liter Flow:       Weight:       Height:         Constitutional- Vitals above, no acute distress.   Head - Normocephalic, atraumatic.   Eyes -  PERRLA, EOMI, anicteric conjunctivae, no scleral injection or conjunctival pallor    Ears, nose, mouth, throat- Moist mucus membranes, No oral ulcer or bleeding.   Neck - No swelling or tracheal deviation, no lymphadenopathy appreciated   Respiratory - Symmetric chest rise, no increased work of breathing.  Cardiovascular - Peripheral pulses intact, no pedal edema.  Gastrointestinal- Soft, Non-TTP. No hepatosplenomegaly. BS+ in all quadrants  Skin - No exposed rash, lesion or jaundice   Neurologic - Answering questions and following commands appropriately, No CN deficit, normal fluid speech  Psychiatric - Appropriate mood/affect, Judgement intact, thought content appropriate, AAOx3  Labs/Imaging:  Pertient labs/imaging were reviewed on initiation of progress note.

## 2023-07-30 NOTE — Case Management (ED)
 Case Management Progress Note    NAME:Javier Gutierrez                          MRN: [id number]              DOB:[birth year]          AGE: 83 y.o.  ADMISSION DATE: 07/25/2023             DAYS ADMITTED: LOS: 3 days      Today's Date: 07/30/2023    PLAN: Discharge home with family and hospice care.     Expected Discharge Date: 07/31/2023   Is Patient Medically Stable: Yes   Are there Barriers to Discharge? no    INTERVENTION/DISPOSITION:  Discharge Planning                 Transportation              Does the Patient Need Case Management to Arrange Discharge Transport? (ex: facility, ambulance, wheelchair/stretcher, Medicaid, cab, other): No  Will the Patient Use Family Transport?: Yes  Transportation Name, Phone and Availability #1: Daughter Laddie Math [phone]  Support               Palliative Care team rounded on pt. Present bedside with pt was his daughter Shellee).   She shares that at this time their plan is to return home with hospice care through [facility]. She declines any DME needs.   Team offered ongoing support.   OSH-DNR completed during visit. Copy scanned to document management, original placed in locked wall chart.   Notified primary SWCM/RNCM.   Info or Referral                 Positive SDOH Domains and Potential Barriers     Medication Needs       Financial                 Legal                 Other                 Discharge Disposition         Selected Continued Care - Admitted Since 07/25/2023       [facility] Coordination complete.      Service Provider Services Address Phone Fax Patient Preferred    [facility] [place] [facility] [address], [place] [address] [phone] --                      Lamarr Isles, LMSW  -[phone]

## 2023-07-30 NOTE — Consults
 SPEECH-LANGUAGE PATHOLOGY  VIDEOSWALLOW ASSESSMENT   Name: Javier Gutierrez   MRN: [id number]     DOB: August 27, 1940      Age: 83 y.o.  Admission Date: 07/25/2023     LOS: 3 days     Date of Service: 07/30/2023        EVALUATION SUMMARY  Pt w/ aspiration of thin liquids w/ delayed cough. No further penetration or aspiration observed across consistencies. Pt observed to orally hold solids. CP bar (radiologist confirmed) impacts free flow of bolus through UES.    Clinical Impression: Mild to moderate dysphagia  Sources of Dysphagia:  Baseline dysphagia 2/2 hx CVA, debilit from overall medical course  Prognosis*: Anticipate patient is at or near baseline      Swallow Recommendations  PO: Pureed, Mildly thick liquids  Medications: As tolerated  Supervision: 1:1  Positioning: Upright as tolerated  Swallow Strategies: Slow rate of intake, Small bites/sips, Multiple swallows, Alternate liquids/solids, 100% supervision to provide cues  for swallow strategies during meals  Oral Hygiene: Complete oral care to minimize the risk of aspirating oral bacteria  Dysphagia Management: Plan to complete videoswallow    MBSImp Scale:     Lip closure for intraoral bolus containment resulted in no labial escape. Tongue control during bolus hold maintained a cohesive bolus held between tongue to palate seal. Bolus preparation and mastication was only minimal chewing/mashing, the majority of the bolus unchewed. Bolus transport/lingual motion was with slowed tongue motion. Oral residue was a trace, lining oral structures.     Initiation of the pharyngeal swallow occured when the bolus head was in the pyriform sinuses. Soft palate elevation resulted in no bolus between the soft palate and the pharyngeal wall. Laryngeal elevation was decreased, with partial superior movement of the thyoid cartilage/partial approximation of the arytenoids to the epigoittic  petiole. Anterior hyoid excursion demonstrated partial anterior movement. Epiglottic movement resulted in partial inversion. Laryngeal vestibular closure was incomplete, with narrow column of air/contrast noted within the laryngeal vestibule at the height of the swallow. Pharyngeal stripping wave was present, but diminished. Pharyngeal contraction was complete. Pharyngoesophageal segment opening demonstrated partial distention /partial duration, with partial obstruction of bolus flow. Tongue base retraction allowed a wide column of contrast or air between the retracted tongue base and the posterior pharyngeal wall. Pharyngeal residue was a collection of residue within or on pharyngeal structures. Esophageal clearance in the upright position resulted in esophageal retention with incidence of retrograde bolus flow below the pharyngoesophageal segment.     Summary  Plan: Patient functioning at baseline - No further SLP therapy indicated at this time  Prognosis*: Anticipate patient is at or near baseline  NOMS Dysphagia Rating*: 4-Mild-Moderate Dysphagia -Swallow safe but usually requires mod cues to use compensatory strategies &/or has mod diet restrictions &/or still requires tube feeding &/or oral supplements.  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, who presented with right-sided flank pain found to have nephrolithiasis and evidence of hypoxic respiratory failure requiring supplemental O2  Significant Hospital Events: Patient is an 83 year old male with a history of COPD, heart failure with reduced ejection fraction, A-fib on anticoagulation, PAD, history of CVA  with expressive aphasia, 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 Similar multifocal central and peripheral bronchiectasis, likely sequela of chronic infection or aspiration. Small pleural effusions. By my electronic signature, I attest that I have personally reviewed the images for this examination and formulated the interpretations and opinions expressed in this report  Finalized by Eleanor Don, M.D. on 07/27/2023 2:17 PM. Dictated by Mont Lay, MD on 07/27/2023 1:51 PM.    No results found.    Nutrition  Nutrition  Nutrition Prior To Hospitalization: Oral, Minced and moist, Moderately Thick Liquids (w/ potential for aspiration)  Current Form Of Nutrition: Pureed, Moderately Thick Liquids (w/ potential for aspiration)    Views / Market researcher / Seating*  Views / Seating: Lateral View, Scanned Esophagus    PO Presentation  PO Presentation  Presentations: Therapist Fed  Thin Liquid: Straw  Mildly Thick Liquid: Straw  Moderately Thick Liquid: 1 Tsp, Straw  Other Consistencies: Puree    Education  Education*  Persons Educated: Pt/Family  Barriers To Learning: None Noted  Teaching Methods: Verbal  Topics: Dysphagia  Patient Response: Verbalized Understanding      Speech Discharge Recommendations  Speech Discharge Recommendations  Recommendation: No further ST recommended at this time    Therapist: Carmelita Moores, SLP MA, [facility] Voalte# [id number]  Date: 07/30/2023

## 2023-07-30 NOTE — Progress Notes
 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)    Encounter for palliative care    Right ureteral stone    Patient is an 83 year old male with a history of COPD, heart failure with reduced ejection fraction, A-fib on anticoagulation, PAD, history of CVA with expressive aphasia, 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  Presented with right-sided flank pain, leukocytosis of 15,000, CT abdomen/pelvis demonstrated 3 mm right-sided ureteral calculus with hydroureteronephrosis.  There is mild bladder wall thickening as well as possible findings of chronic outlet obstruction felt to be cystitis.  UA showed trace leuks, packed RBCs and did not meet criteria for culture.  Urology was consulted and he underwent right-sided ureteroscopy with right-sided ureteral stent placement and will arrange for definitive stone management as an outpatient  - Patient currently not on Flomax has been unable to tolerate it (developed hypotension in the past)    Acute hypoxic respiratory failure secondary to pneumonia versus volume overload versus ongoing silent aspiration  Leukocytosis, etiology secondary to infection versus reactive from nephrolithiasis and procedure  Mild volume overload  - Patient with leukocytosis of 15,000 when presented, noted to have bilateral lower lung pulmonary opacities similar on the right from May imaging and increased on the left, radiographically appeared pneumonia versus aspiration versus atelectasis.  - MRSA pneumonia screen was negative, procalcitonin was 0.1 at presentation  - Presentation was felt he had mild volume overload and heart failure was consulted and he was diuresed with IV Lasix  40 mg x 1  -Past with evidence of multifocal pneumonia, in the context of his ongoing dysphagia this most likely represents ongoing aspiration especially in light of negative procalcitonin  -Was on Doxy from 7/3 to 7/6, Completed 5 days of Zosyn   Plan:  > Family understands he is at risk for ongoing aspiration but favors to continue to the patient to eat as he is able to tolerate    Moderate dysphagia  - Patient has been increasingly susceptible to mental status changes with acute illness, SLP attempted to evaluate on 7/5 but patient was too drowsy.  Family favored continuing pur?ed diet as he is tolerated this in the past when he has been arousable  - They understand risk of ongoing aspiration and would favor to continue to eat pureed with thickened liquids  - Do not believe patient  can tolerate esophogram(due to inability to swallow thin liquids) but will underwent a video swallow that demonstrated oropharyngeal dysphagia as well as some esophageal retention  - Will consult GI to see if there is any kind of endoscopic utility, this is primarily to see if there is any interventions that could provide some quality of life improvement as we anticipated discharged home with hospice    Goals of Care  - Patient's had a decline in his overall functional status especially the last few months, due to hospitalizations and acute illness.  He did have some meaningful improvement after last discharge.  Daughter tells me that he was able to be evaluated by hospice at home who felt he was likely doing too well to qualify for hospice.  Per outpatient discussion with primary care provider the original plan was to try and avoid hospital and the patient again but unfortunately currently hospitalized and quite debilitated.  Daughter is somewhat perplexed/confused on how to best approach overall care for her father.  Evidently had previously said he wants to be full code but does not seem he has insight into what this would entail at this point so for now she has been trying to respect his wishes and keeping him full code but also recognizes that he is been declining.  She is hopeful for getting over the acute illness and possibly getting back to where he was prior to hospitalization but acknowledges that this may not be feasible.  - Palliative care consultation, CODE STATUS transition to DNAR limited intervention and sending referrals for home with hospice, anticipate at the time of discharge we will discharge home with hospice with the primary intention to keep the patient out of the hospital    AKI on CKD, III  - Baseline has been extremely variable but appears to be around 1.2-1.4 and has frequent spikes to the 1.6 range and occasionally does get into the 1.1 range  - Creatinine has been mildly elevated likely secondary to acute illness in the setting of prolonged decreased p.o. intake (does not eat much from 7/3 to 7/5)  - Continue to monitor    History of mild cognitive decline  History of CVA with expressive aphasia  - Baseline patient tends to be alert and oriented x 2-3 with difficulty in word finding    Hypertension  - Changed PTA metoprolol  to Coreg , increase dose to 12.5 mg twice daily  - Start amlodipine  5 mg daily on 7/8  - Continue hydralazine  and labetalol  as needed, titrating medications very cautiously and slowly as patient has been very sensitive to medication changes.  Became hypotensive even on Flomax in the past    GERD-continue PTA PPI  History of atrial fibrillation-continue PTA Eliquis , changed metoprolol  to coreg  for better BP control  Hx of COPD? 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  PT/OT recommending inpatient, however overall goal is most likely going to line with discharging home with hospice    Patient was discussed at multidisciplinary rounds which included nurse, pharmacist, social work, and nurse case management who provided additional information that affected the patient's care.    Total Time Today was > 65 minutes minutes in the following activities: Preparing to see the patient, Obtaining and/or reviewing separately obtained history, Performing  a medically appropriate examination and/or evaluation, Counseling and educating the patient/family/caregiver, Ordering medications, tests, or procedures, Documenting clinical information in the electronic or other health record, Independently interpreting results (not separately reported) and communicating results to the patient/family/caregiver and Care coordination (not separately reported)    Note to patient: The 21st Century Cures Act makes medical notes like these available to patients in the interest of transparency. However, be advised this is a medical document. It is intended as peer to peer communication. It is written in medical language and may contain abbreviations or verbiage that are unfamiliar. It may appear blunt or direct. Medical documents are intended to carry relevant information, facts as evident, and the clinical opinion of the practitioner.    Lyanne Galt, DO  Pager [phone]    __________________________________________________________________________________  Subjective: Javier Gutierrez is a 83 y.o. male     Knots his head yes no and says he has no complaints.  Daughter at the bedside.  Reports he had some improved p.o. intake yesterday and slept better.  Overall she feels most likely leading towards discharging home on hospice.  I saw the patient again after the video swallow results and we discussed both the oropharyngeal dysphagia as well as possible esophageal component.  Daughter wants to see if GI has any input and if there is any interventions that could provide some improvement in quality of life.    Medications:  Scheduled Meds:albuterol -ipratropium (DUONEB) nebulizer solution 3 mL, 3 mL, Inhalation, TID & PRN  amLODIPine  (NORVASC ) tablet 5 mg, 5 mg, Oral, QDAY  apixaban  (ELIQUIS ) tablet 2.5 mg, 2.5 mg, Oral, BID  arformoteroL  (BROVANA ) nebulizer solution 15 mcg, 15 mcg, Inhalation, BID  atorvastatin  (LIPITOR) tablet 20 mg, 20 mg, Oral, QDAY  budesonide  (PULMICORT ) nebulizer solution 0.5 mg, 0.5 mg, Inhalation, BID  carvediloL  (COREG ) tablet 12.5 mg, 12.5 mg, Oral, BID  clopiDOGreL  (PLAVIX ) tablet 75 mg, 75 mg, Oral, QDAY  dutasteride  (AVODART ) capsule 0.5 mg, 0.5 mg, Oral, QDAY  levothyroxine  (SYNTHROID ) tablet 88 mcg, 88 mcg, Oral, QDAY(07)  mirabegron  (MYRBETRIQ ) ER tablet 25 mg, 25 mg, Oral, QDAY  montelukast  (SINGULAIR ) tablet 10 mg, 10 mg, Oral, QHS  pantoprazole  DR (PROTONIX ) tablet 40 mg, 40 mg, Oral, QDAY  sodium chloride  (NEBUSAL) 3 % nebulizer solution 4 mL, 4 mL, Inhalation, BID    Continuous Infusions:  PRN and Respiratory Meds:albuterol  sulfate PRN, guaiFENesin   (ROBITUSSIN) oral solution Q4H PRN, hydrALAZINE  Q8H PRN, hyoscyamine  Q4H PRN, labetalol  (NORMODYNE ; TRANDATE ) injection Q4H PRN, melatonin QHS PRN, polyethylene glycol 3350  QDAY PRN, sennosides-docusate sodium  QDAY PRN      Physical Exam:  Vital Signs: Last Filed In 24 Hours Vital Signs: 24 Hour Range   BP: 148/64 (07/08 1201)  Temp: 36.3 ?C (97.4 ?F) (07/08 1201)  Pulse: 71 (07/08 1201)  Respirations: 16 PER MINUTE (07/08 1201)  SpO2: 96 % (07/08 1201)  O2 Device: None (Room air) (07/08 1201) BP: (148-188)/(61-89)   Temp:  [36.3 ?C (97.3 ?F)-36.8 ?C (98.2 ?F)]   Pulse:  [64-77]   Respirations:  [16 PER MINUTE-22 PER MINUTE]   SpO2:  [94 %-100 %]   O2 Device: None (Room air)          General: Arousable, oriented x 1-2  Lungs: Coarse breath sounds bilaterally worse on the left  Chest wall:  No tenderness or deformity.  Heart:  RRR, S1, S2, no other murmurs  Extremities:  No LE edema  Neurologic: Globally weak    Lab/Radiology/Other Diagnostic Tests:  24-hour labs:  Results for orders placed or performed during the hospital encounter of 07/25/23 (from the past 24 hours)   CBC AND DIFF    Collection Time: 07/30/23  6:37 AM   Result Value Ref Range    White Blood Cells 8.20 4.50 - 11.00 10*3/uL    Red Blood Cells 4.10 (L) 4.40 - 5.50 10*6/uL    Hemoglobin 11.4 (L) 13.5 - 16.5 g/dL    Hematocrit 64.9 (L) 40.0 - 50.0 %    MCV 85.5 80.0 - 100.0 fL    MCH 27.9 26.0 - 34.0 pg    MCHC 32.6 32.0 - 36.0 g/dL    RDW 80.8 (H) 88.9 - 15.0 %    Platelet Count 255 150 - 400 10*3/uL    MPV 8.2 7.0 - 11.0 fL    Neutrophils 66.6 41.0 - 77.0 %    Lymphocytes 15.1 (L) 24.0 - 44.0 %    Monocytes 9.0 4.0 - 12.0 %    Eosinophils 8.3 (H) 0.0 - 5.0 %    Basophils 1.0 0.0 - 2.0 %    Absolute Neutrophil Count 5.50 1.80 - 7.00 10*3/uL    Absolute Lymph Count 1.20 1.00 - 4.80 10*3/uL    Absolute Monocyte Count 0.70 0.00 - 0.80 10*3/uL    Absolute Eosinophil Count 0.70 (H) 0.00 - 0.45 10*3/uL    Absolute Basophil Count 0.10 0.00 - 0.20 10*3/uL   COMPREHENSIVE METABOLIC PANEL    Collection Time: 07/30/23  6:37 AM   Result Value Ref Range    Sodium 145 137 - 147 mmol/L    Potassium 3.3 (L) 3.5 - 5.1 mmol/L    Chloride 111 (H) 98 - 110 mmol/L    Glucose 96 70 - 100 mg/dL    Blood Urea Nitrogen 27 (H) 7 - 25 mg/dL    Creatinine 8.67 (H) 0.40 - 1.24 mg/dL    Calcium 8.2 (L) 8.5 - 10.6 mg/dL    Total Protein 5.6 (L) 6.0 - 8.0 g/dL    Total Bilirubin 0.4 0.2 - 1.3 mg/dL    Albumin  2.6 (L) 3.5 - 5.0 g/dL    Alk Phosphatase 38 25 - 110 U/L    AST 8 7 - 40 U/L    ALT 4 (L) 7 - 56 U/L    CO2 24 21 - 30 mmol/L    Anion Gap 10 3 - 12    Glomerular Filtration Rate (GFR) 54 (L) >60 mL/min   MAGNESIUM     Collection Time: 07/30/23  6:37 AM   Result Value Ref Range    Magnesium  1.7 1.6 - 2.6 mg/dL     Glucose: 96 (92/91/74 9362)  Pertinent radiology reviewed.    Lyanne ONEIDA Galt, DO  Pager [phone]

## 2023-07-30 NOTE — Progress Notes
 BH 62 END OF SHIFT NOTE    Nursing Shift: Day Shift 0700-1900    Admission Date: 07/25/2023    Acute events, interventions, and provider communication:   0757: BP: 176/65. Doctor Danell, MD notified.   1527: BP: 196/78. Pt asymptomatic. PRN medication give (See MAR). Doctor Danell, MD notified.   1640: Rechecked BP: 187/80. Doctor Johnnye, MD notified.     Patient Goal(s)  Patient will demonstrate improved self care by the end of next shift.        Patient will verbalize readiness for discharge by discharge.   Heart Failure Shift Summary    Admission Weight: Weight: 69.9 kg (154 lb)    Last 3 Weights:   Vitals:    07/29/23 0414 07/29/23 0532 07/30/23 0619   Weight: 70.4 kg (155 lb 3.3 oz) 70.4 kg (155 lb 3.3 oz) 69.3 kg (152 lb 12.5 oz)     Weight Change: Weight trend decreasing    Intake/Output Summary (Last 24 hours) at 07/30/2023 1055  Last data filed at 07/30/2023 0500  Gross per 24 hour   Intake 2320 ml   Output 925 ml   Net 1395 ml     Last Bowel Movement Date: 07/29/23    Fluid Restriction? No   Quality/Safety    Total Fall Risk Score: 18   Risk for Injury related to falls: Standard risk for injury based on the ABCS scoring tool  Fall Risk Category:   History of More Than One Fall Within 6 Months Before Admission: No  Elimination, Bowel and Urine: Incontinence OR urgency OR frequency  Interventions: Bedpan available in room  Medications: On 2 or more high fall risk drugs  Interventions: Educate patient on medication side effects, Remain within arm's reach while ambulating (orthostasis, dizziness), Remain within arm's reach while toileting (orthostasis, dizziness), and Gait belt utilized while ambulating  Patient Care Equipment: One present  Interventions: Ensure environment is free of clutter and tripping hazards, Patient requires assistance while ambulating with equipment, and Remove unused patient care equipment   Mobility: 2 - Assistance required, 2 - Unsteady gait, 2 - Visual or auditory impairment affecting mobility  Interventions: Patient educated over how their mobility status places them at risk for falls, Requires additional staff to assist with mobilizing, Gait belt required while mobilizing , Patient requires staff within arm's reach while ambulating, Patient requires staff within arm's reach while toileting, and PT/OT consulted (weakness, unsteady gait, etc.)  Cognition: 1 - Altered awareness of immediate physical environment  Interventions: Staff remains within arm's reach while ambulating AND toileting (REQUIRED)    Other safety precautions in place: HAPI Prevention Bundle    Restraints: No       CBHP Patient: No   Patient Education  This RN provided education to Patient and Family today. They were engaged. and verbalized understanding..  Quality/Safety Education:   Fall risk  Medication Education:   Medication management (Indication, adverse effects, monitoring, etc)  Education provided on the following medication(s): zosyn    Continue to address: indications  General Education:   Discharge planning    The following teaching method(s) were used: Verbal  Follow-up should occur: as needed.

## 2023-07-30 NOTE — Progress Notes
 07/30/23 0100 07/30/23 0345 07/30/23 0430   Vitals   BP (!) 172/81 (!) 182/78 (!) 177/76   Mean NBP (Calculated) (!) 111 MM HG (!) 113 MM HG 110 MM HG      07/30/23 0500 07/30/23 0530 07/30/23 0651   Vitals   BP (!) 168/75 (!) 180/68 (!) 188/80   Mean NBP (Calculated) 106 MM HG 105 MM HG (!) 116 MM HG     PRN meds for HTN given after nothing works , on call informed and new orders obtained.

## 2023-07-30 NOTE — Progress Notes
 PALLIATIVE CARE INPATIENT NOTE     Name: Javier Gutierrez            MRN: [id number]                DOB: 09/16/1940          Age: 83 y.o.  Admission Date: 07/25/2023             LOS: 3 days    ASSESSMENT/PLAN     Javier Gutierrez is a 83 y.o. male who was brought to the ED by family with report of right sided abdominal pain, found to have acute hypoxic respiratory failure likely secondary to ongoing aspiration. Palliative care asked to help with complex medical decision making.    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  Christina told us  that he has had improvement in his swallowing which was noted in the recent swallowing evaluation.  She told us  that she gave him a little Pepsi last night which he enjoys.  Javier Gutierrez is aware that Javier Gutierrez prefers to be at home and believes that he would rather be at home spending time rather than going through rehab.  She has decided that their plan will be to go home with hospice care.  If Tom improves then they may sign off from hospice but Javier Gutierrez knows the time does not want to come back to the hospital.  Javier Gutierrez told us  that at this point she does not believe that there is any other equipment that is needed at home and that Javier Gutierrez prefers to sleep in a recliner.  Javier Gutierrez was awake during our visit, smiling and happy and denied any pain or other discomfort.    .  Recommendations:  Family has decided to discharge home with hospice care.    Our social worker has notified the [facility] of their decision to discharge with hospice.  No additional equipment is needed per her daughter.  Out-of-hospital DNR has been completed and is in the patient's chart.  The palliative care team will continue to follow until the patient discharges home.  We will be available for ongoing goals of care discussion if needed.    Dick Croak MD  Division of Palliative Medicine  Available on [facility]  Available on [facility] [phone]  Palliative ON-CALL Pager [phone]    Total Time Today was >35 minutes in the following activities: Preparing to see the patient, Obtaining and/or reviewing separately obtained history, Performing a medically appropriate examination and/or evaluation, Counseling and educating the patient/family/caregiver,  Documenting clinical information in the electronic or other health record, Independently interpreting results (not separately reported) and Care coordination (not separately reported);      PALLIATIVE CARE PLANNING     Advance Care Planning:   Identified Health Care Decision Maker:    DPOA - Has DPOA, status: completed, see scanned document  TPOPP - Introduced, patient (or surrogate) considering - they will want to do one.     PC Clinic - NA    Medication safety - NA    Disposition planning: too soon to know.  Likely return home, possibly with hospice.        SUBJECTIVE     CC/Reason for Visit:Goals of care related to multifocal pneumonia likely secondary  to ongoing dysphagia in the setting of previous CVA with aphasia as well as renal calculus.     Additional history from: Review of EMR,communicated with Shravan Nadella, DO.     History of Present Illness: 83 yo male admitted with multifocal pneumonia likely secondary to aspiration related to ongoing dysphagia. Also with right kidney stone status post cysto and ureteral stent for stone in functionally solitary kidney.  He was having abdominal pain that prompted visit to the ED, but his is not present any longer, he denies pain, he denies nausea, denies shortness of breath. He is weak in appearance. He is pleasant but unable to participate in high level decision making.     Subjective: When we saw Javier Gutierrez today, he was resting in his chair.  He had just finished rehab and earlier had a swallow study and was understandably tired.  He denied pain or other discomfort.  His daughter Javier Gutierrez is at the bedside.      ROS: Review of Systems   Constitutional:  Positive for malaise/fatigue.   Neurological:  Positive for speech change and weakness.   Psychiatric/Behavioral:  Positive for memory loss.            OBJECTIVE     Blood pressure (!) 148/64, pulse 71, temperature 36.3 ?C (97.4 ?F), height 177.8 cm (5' 10), weight 69.3 kg (152 lb 12.5 oz), SpO2 96%.  Physical Exam  Constitutional:       Comments: Chronically ill appearing older male.   HENT:      Head: Normocephalic.      Nose: Nose normal.      Mouth/Throat:      Mouth: Mucous membranes are moist.   Eyes:      Extraocular Movements: Extraocular movements intact.      Pupils: Pupils are equal, round, and reactive to light.   Cardiovascular:      Rate and Rhythm: Normal rate and regular rhythm.      Pulses: Normal pulses.   Pulmonary:      Effort: Pulmonary effort is normal.   Abdominal:      General: Bowel sounds are normal. There is no distension.      Palpations: Abdomen is soft.      Tenderness: There is no abdominal tenderness.   Musculoskeletal:      Right lower leg: No edema.      Left lower leg: No edema.   Skin:     General: Skin is warm and dry.      Coloration: Skin is pale.   Neurological:      Mental Status: Mental status is at baseline.   Psychiatric:         Behavior: Behavior normal.         Lab Results:  CBC   Lab Results   Component Value Date/Time    WBC 8.20 07/30/2023 06:37 AM    HGB 11.4 (L) 07/30/2023 06:37 AM    PLTCT 255 07/30/2023 06:37 AM     Lab Results   Component Value Date/Time    NEUT 66.6 07/30/2023 06:37 AM    ANC 5.50 07/30/2023 06:37 AM      Chemistries   Lab Results   Component Value Date/Time    NA 145 07/30/2023 06:37 AM    K 3.3 (L) 07/30/2023 06:37 AM    BUN 27 (H) 07/30/2023 06:37 AM    CR 1.32 (H) 07/30/2023 06:37 AM    GLU 96 07/30/2023 06:37 AM     Lab  Results   Component Value Date/Time    CA 8.2 (L) 07/30/2023 06:37 AM    PO4 2.1 03/12/2023 05:49 AM    ALBUMIN  2.6 (L) 07/30/2023 06:37 AM    TOTPROT 5.6 (L) 07/30/2023 06:37 AM    ALKPHOS 38 07/30/2023 06:37 AM    AST 8 07/30/2023 06:37 AM    ALT 4 (L) 07/30/2023 06:37 AM    TOTBILI 0.4 07/30/2023 06:37 AM    GFR 54 (L) 07/30/2023 06:37 AM    GFRAA 48 (L) 06/29/2018 06:53 AM        Other Pertinent Diagnostic Results:   07/27/23 CT chest without contrast:  IMPRESSION   Increased lower lobe consolidations with other areas of faint tree-in-bud nodularity, likely multifocal pneumonia on the basis of aspiration.   Similar multifocal central and peripheral bronchiectasis, likely sequela of chronic infection or aspiration.   Small pleural effusions.     07/25/23 CT abdomen/pelvis with contrast: IMPRESSION   1. 3 mm right mid ureteral calculus, with minimal upstream hydroureteronephrosis.   2. Additional nonobstructing bilateral renal calculi. No left hydronephrosis.   3. Mild bladder wall thickening, which may be secondary to chronic outlet obstruction; though cystitis could appear similar. Correlation with urinalysis is recommended.   4.  Trace pleural effusions. Adjacent patchy opacities are likely atelectasis, though infection and/or aspiration could contribute.   5.  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   6.  Cholelithiasis.       Palliative Care Data  Patient Location at Time of Consultation: Hospital - General floor (includes step-down,  pre-op )  Primary Diagnosis: Infectious: Pneumonia likely secondary to ongoing aspiration

## 2023-07-30 NOTE — Progress Notes
 OCCUPATIONAL THERAPY  PROGRESS NOTE      Name: Javier Gutierrez   MRN: [id number]     DOB: 11/09/1940      Age: 83 y.o.  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 Pt agreeable to OT on this date. On entry, pt supine in bed. On exit, pt seated in bedside chair with all needs in reach and daughter at bedside. RN notified.    Home Living Situation  Lives With: Family;Has 24/7 assistance available;Receives assistance from family  Comments: Daughter and son are caregivers  Type of Home: House  Entry Stairs: No stairs  In-Home Stairs: No stairs  Bathroom Setup: Walk in shower  Patient Owned Equipment: Bathing: Paediatric nurse;Toilet: Grab bars;Walker with wheels;Wheelchair    Prior Level of Function  Level Of Independence: Independent with ADL and household mobility with device  Comments: Set up of ADLs. Requires assist with eating/drinking. Dtr reported patient had been progressing over the last few weeks prior to kidney stone    Precautions  Comments: Thickened liquids    ADL's  Comment: Pleasantly declines ADLs, reporting plans to have bed bath with nursing staff later on this date.    ADL Mobility  Bed Mobility: Supine to Sit: Moderate assist  Bed Mobility Comments: HOB elevated, use of bed rails, assist with trunk  Transfer Type: Stand pivot  Transfer: Assistance Level: From;Bed;To;Bedside chair;Moderate assist  Transfer: Assistive Device: Roller walker  Transfer: Type of Assistance: Elevated bed;For safety considerations;For strength deficit;Verbal cues;Requires extra time  End of Activity Status: In bed;Nursing notified;Instructed patient to use call light;Instructed patient to request assist with mobility  Transfer Comments: Transfers sit to stand with mod assist, then takes small steps to pivot to chair. Requires max cueing for intiation of steps with R and L feet.  Sitting Balance: Static sitting balance;Dynamic sitting balance;2 UE support;Standby assist  Standing Balance: Static standing balance;Dynamic standing balance;2 UE support;Moderate assist;Minimal assist    Activity Tolerance  Endurance: 2/5 Tolerates 10-20 Minutes Exercise w/Multiple Rests  Comment: Limited by weakness and fatigue.    Cognition  Expression: Expressive Aphasia;Increased Time for Expression;Mumbling/Slurred  Social Interaction: Interacts in a Network engineer;Increased Time to Adjust  Problem Solving: Direction Following Assist;Decreased Judgment/Safety  Attention: Awake/Alert    Education  Persons Educated: Patient/Family  Barriers To Learning: Impaired Communication  Interventions: Repetition of Instructions;Increase Volume of Instruction  Teaching Methods: Verbal Instruction  Patient Response: Verbalized Understanding  Topics: Role of OT, Goals for Therapy  Goal Formulation: With Patient/Family    Assessment  Assessment: Decreased Safe/Judg during ADL;Decreased Endurance;Decreased Self-Care Trans;Decreased High-Level ADLs  Prognosis: Fair;w/Cont OT s/p Acute Discharge  Goal Formulation: Pt/family  Comments: Patient currently limited  by weakness, balance, and endurance. Patient is a fall risk. Has good social support from family at home. Will benefit from OT services to progress ADLs and functional mobility to promote independence and decrease caregiver burden.    AM-PAC 6 Clicks Daily Activity Inpatient  Putting on and taking off regular lower body clothes: Total  Bathing (Including washing, rinsing, drying): A Lot  Toileting, which includes using toilet, bedpan, or urinal: A Lot  Putting on and taking off regular upper body clothing: A Lot  Taking care of personal grooming such as brushing teeth: A Little  Eating meals: A Lot  Daily Activity Raw Score: 12  Standardized (T-scale) Score: 30.6    Plan  OT Frequency: 2-3x/week  OT Plan for Next Visit: Sit to stands, BSC transfer, progress mobility to ambulate, ADLs at EOB vs. standing at sink w chair    ADL Goals  Patient Will Perform All ADL's: w/ Minimal Assist    Functional Transfer Goals  Pt Will Perform All Functional Transfers: Minimum Assist    OT Discharge Recommendations  Recommendation: Inpatient setting  Patient Currently Requires Physical Assist With: All mobility;All personal care ADLs;All home functioning ADLs  Patient Currently Requires Supervision For: ADLs;Eating meals;Making decisions about safety      Therapist: Cherylynn Leghorn, OTD, OTR/L  Date: 07/30/2023

## 2023-07-30 NOTE — Care Plan
 Problem: Discharge Planning  Goal: Participation in plan of care  Outcome: Goal Ongoing  Flowsheets (Taken 07/30/2023 1052)  Participation in Plan of Care: Involve patient/caregiver in care planning decision making  Goal: Knowledge regarding plan of care  Outcome: Goal Ongoing  Flowsheets (Taken 07/30/2023 1052)  Knowledge regarding plan of care:   Provide VTE signs and symptoms education   Provide fall prevention education   Provide plan of care education   Provide infection prevention education   Provide medication management education  Goal: Prepared for discharge  Outcome: Goal Ongoing  Flowsheets (Taken 07/30/2023 1052)  Prepared for discharge:   Complete ADL ability assessment   Provide safe use medical equipment education   Provide diet and oral health education   Collaborate with multidisciplinary team for hospital discharge coordination     Problem: Skin Integrity  Goal: Skin integrity intact  Outcome: Goal Ongoing  Flowsheets (Taken 07/30/2023 1052)  Skin integrity intact:   Assess nutrition   Promote nutrition   Assure position change   Monitor skin integrity   Reduce skin shear, friction and tissue load   Provide skin care interventions  Goal: Healing of skin (Wound & Incision)  Outcome: Goal Ongoing  Flowsheets (Taken 07/30/2023 1052)  Healing of wound (wounds and Incisions): Assess for signs and symptoms of wound infection  Goal: Healing of skin (Pressure Injury)  Outcome: Goal Ongoing  Flowsheets (Taken 07/30/2023 1052)  Healing of skin (Pressure Injury):   Assess for signs and symptoms of pressure injury infection   Use the National Pressure Injury Advisory Panel Staging System for grading pressure injuries   Assess pressure injury     Problem: High Fall Risk  Goal: High Fall Risk  Outcome: Goal Ongoing  Flowsheets (Taken 07/30/2023 1052)  High Fall Risk:   All patients will receive: High fall risk sign, yellow wristband, yellow socks, gait belt, and shower shoes   Engage bed alarm - middle setting   Engage chair alarm   Stay with the patient while toileting/showering   PT/OT consult for fall prevention assessment if scoring in Unsteady Gait or Visual or Auditory impairment   Remove excess equipment/supplies   Educate patient to use call-light if tethered   Use safe patient handling equipment as appropriate   Maximize bed functionality (optimize bed height, firm/flat surface)     Problem: Glucose Management  Goal: Absence of hyperglycemia  Outcome: Goal Ongoing  Flowsheets (Taken 07/30/2023 1052)  Absence of hyperglycemia:   Assess for sign and symptoms of hyperglycemia   Provide causes of hyperglycemia education  Goal: Absence of Hypoglycemia  Outcome: Goal Ongoing  Flowsheets (Taken 07/30/2023 1052)  Absence of hypoglycemia:   Assess for signs and symptoms of hypoglycemia   Treat hypoglycemia symptoms per standard  Goal: Glucose level within specified parameters  Outcome: Goal Ongoing  Flowsheets (Taken 07/30/2023 1052)  Glucose level within specified parameters: Perform blood glucose monitoring as ordered and/or patient is symptomatic     Problem: Self-Care Deficit  Goal: Maximize Heart Failure Knowledge  Outcome: Goal Ongoing  Flowsheets (Taken 07/30/2023 1052)  Maximize heart failure knowledge:   Provide heart failure education on: disease process,  sign/symptoms, weight monitoring   Provide education for: low sodium diet, fluid restriction medications, activity  Goal: Maximize Heart Failure Attitudes  Outcome: Goal Ongoing  Flowsheets (Taken 07/30/2023 1052)  Maximize Heart failure Attitudes:   Provide heart failure education as needed  on: disease process, signs/symptoms,   weight monitoring, low sodium diet, fluid restriciton, medications, activity  Goal: Maximize Heart Failure Behaviors  Outcome: Goal Ongoing  Flowsheets (Taken 07/30/2023 1052)  Maximize Heart Failure Behaviors: Provide heart failure education as needed: disease process, signs/symptoms, weight, low sodium diet, meds, etc.     Problem: Aspiration, Risk of  Goal: Tolerates oral intake  Outcome: Goal Ongoing  Flowsheets (Taken 07/30/2023 1052)  Tolerates Oral Intake: Speech Therapy swallowing assessment and management  Goal: Absence of aspiration  Outcome: Goal Ongoing  Flowsheets (Taken 07/30/2023 1052)  Absence of aspiration:   Assess for signs and symptoms of aspiration   Provide education on aspiration prevention   Assess cough ability   Assess gag reflex   Elevate head of bed

## 2023-07-31 ENCOUNTER — Encounter: Admit: 2023-07-31 | Discharge: 2023-07-31 | Payer: MEDICARE

## 2023-07-31 ENCOUNTER — Inpatient Hospital Stay: Admit: 2023-07-31 | Discharge: 2023-07-31 | Payer: MEDICARE

## 2023-07-31 LAB — EGD REPORT

## 2023-07-31 NOTE — Case Management (ED)
 Case Management Progress Note    NAME:Javier Gutierrez                          MRN: [id number]              DOB:02-26-1940          AGE: 83 y.o.  ADMISSION DATE: 07/25/2023             DAYS ADMITTED: LOS: 4 days      Today's Date: 07/31/2023    PLAN: discharge home with home hospice.  Patient has been accepted by [facility].   Huddle plan:  GI consult with EGD today.    Expected Discharge Date: 08/01/2023   Is Patient Medically Stable: No, Please explain: Per huddle, EGD to check for esophageal clearance issues.  Are there Barriers to Discharge? no    INTERVENTION/DISPOSITION:  Discharge Planning              Discharge Planning: Home Hospice   NCM reviewed EMR and attended huddle.   NCM provided clinical update to Luke  [phone] at [facility].  Kim to meet with patient's daughter Wanda at the bedside today.   NCM notified Isaiah at [facility] to cancel home health referral as patient will be discharging home with home hospice services.       Transportation              Does the Patient Need Case Management to Arrange Discharge Transport? (ex: facility, ambulance, wheelchair/stretcher, Medicaid, cab, other): No  Will the Patient Use Family Transport?: Yes  Transportation Name, Phone and Availability #1: Daughter Tam [place] [phone]    Support              Support: Pt/Family Updates re:POC or DC Plan, Huddle/team update    Info or Referral              Information or Referral to Community Resources: No Needs Identified    Positive SDOH Domains and Potential Barriers   Positive for SDOH Domain:  (No CM SDOH needs identified.)           Medication Needs              Medication Needs: No Needs Identified                                Financial              Financial: No Needs Identified    Legal              Legal: No Needs Identified    Other              Other/None: No needs identified    Discharge Disposition                                              Selected Continued Care - Admitted Since 07/25/2023       [facility]       Service Provider Services Address Phone Fax Patient Preferred    [facility] [address] [place], [place] [address] [phone] --  Rojelio Pizza RN, MSN, Barista Case Manager  [phone]  Office (preferred)  Available on [place] [phone]

## 2023-07-31 NOTE — Progress Notes
 BH 62 END OF SHIFT NOTE    Nursing Shift: Night Shift 1900-0700    Admission Date: 07/25/2023    Acute events, interventions, and provider communication: N/A    Patient Goal(s)  Patient will verbalize readiness for discharge by the end of next shift.        Patient will reach and maintain goal dry weight by discharge.   Heart Failure Shift Summary    Admission Weight: Weight: 69.9 kg (154 lb)    Last 3 Weights:   Vitals:    07/29/23 0532 07/30/23 0619 07/31/23 0500   Weight: 70.4 kg (155 lb 3.3 oz) 69.3 kg (152 lb 12.5 oz) 69.6 kg (153 lb 7 oz)     Weight Change: Weight trend stable    Intake/Output Summary (Last 24 hours) at 07/31/2023 0635  Last data filed at 07/31/2023 0440  Gross per 24 hour   Intake 100 ml   Output --   Net 100 ml     Last Bowel Movement Date: 07/29/23    Fluid Restriction? No   Quality/Safety    Total Fall Risk Score: 18   Risk for Injury related to falls: Standard risk for injury based on the ABCS scoring tool  Fall Risk Category:   History of More Than One Fall Within 6 Months Before Admission: No  Elimination, Bowel and Urine: Incontinence OR urgency OR frequency  Interventions: Elimination schedule  Medications: On 2 or more high fall risk drugs  Interventions: Educate patient on medication side effects  Patient Care Equipment: One present  Interventions: Ensure environment is free of clutter and tripping hazards and Patient requires assistance while ambulating with equipment  Mobility: 2 - Assistance required, 2 - Unsteady gait, 2 - Visual or auditory impairment affecting mobility  Interventions: Patient educated over how their mobility status places them at risk for falls, Requires additional staff to assist with mobilizing, Gait belt required while mobilizing , Patient requires staff within arm's reach while ambulating, PT/OT consulted (weakness, unsteady gait, etc.), and Patient requires assistive devices to assist with mobilization (walker, cane, etc.)  Cognition: 1 - Altered awareness of immediate physical environment  Interventions: Bed and chair alarms in place (REQUIRED), Staff remains within arm's reach while ambulating AND toileting (REQUIRED), Patient requires lap belt in place while sitting upright and is able to demonstrate their own release, and Toileting schedule implemented to reduce anxiety or agitation around incontinent episodes    Other safety precautions in place: HAPI Prevention Bundle and Aspiration Precautions    Restraints: No       CBHP Patient: No   Patient Education  This RN provided education to Patient and Family today. They were engaged. and demonstrated understanding..  Quality/Safety Education:   Fall risk and Pressure injury prevention/care  Medication Education:   Medication management (Indication, adverse effects, monitoring, etc)  Education provided on the following medication(s): See eMAR  Continue to address: indications  General Education:   Diet/nutrition, Discharge planning, and Mobility/Activity intolerance    The following teaching method(s) were used: Verbal  Follow-up should occur: daily.

## 2023-07-31 NOTE — Care Plan
 Problem: Discharge Planning  Goal: Participation in plan of care  Outcome: Goal Ongoing  Flowsheets (Taken 07/31/2023 1041)  Participation in Plan of Care: Involve patient/caregiver in care planning decision making  Goal: Knowledge regarding plan of care  Outcome: Goal Ongoing  Flowsheets (Taken 07/31/2023 1041)  Knowledge regarding plan of care:   Provide fall prevention education   Provide VTE signs and symptoms education   Provide infection prevention education   Provide medication management education   Provide plan of care education  Goal: Prepared for discharge  Outcome: Goal Ongoing  Flowsheets (Taken 07/31/2023 1041)  Prepared for discharge:   Complete ADL ability assessment   Provide safe use medical equipment education   Provide diet and oral health education   Collaborate with multidisciplinary team for hospital discharge coordination     Problem: Skin Integrity  Goal: Skin integrity intact  Outcome: Goal Ongoing  Flowsheets (Taken 07/31/2023 1041)  Skin integrity intact:   Assess nutrition   Promote nutrition   Assure position change   Monitor skin integrity   Reduce skin shear, friction and tissue load   Provide skin care interventions  Goal: Healing of skin (Wound & Incision)  Outcome: Goal Ongoing  Flowsheets (Taken 07/31/2023 1041)  Healing of wound (wounds and Incisions): Assess for signs and symptoms of wound infection  Goal: Healing of skin (Pressure Injury)  Outcome: Goal Ongoing  Flowsheets (Taken 07/31/2023 1041)  Healing of skin (Pressure Injury):   Assess for signs and symptoms of pressure injury infection   Use the National Pressure Injury Advisory Panel Staging System for grading pressure injuries   Assess pressure injury     Problem: High Fall Risk  Goal: High Fall Risk  Outcome: Goal Ongoing  Flowsheets (Taken 07/31/2023 1041)  High Fall Risk:   All patients will receive: High fall risk sign, yellow wristband, yellow socks, gait belt, and shower shoes   PT/OT consult for fall prevention assessment if scoring in Unsteady Gait or Visual or Auditory impairment   Remove excess equipment/supplies   Educate patient to use call-light if tethered   Use safe patient handling equipment as appropriate   Maximize bed functionality (optimize bed height, firm/flat surface)   Stay with the patient while toileting/showering   Assess need for bedside commode with drop arm to be available in room   Engage chair alarm     Problem: Glucose Management  Goal: Absence of hyperglycemia  Outcome: Goal Ongoing  Flowsheets (Taken 07/31/2023 1041)  Absence of hyperglycemia:   Assess for sign and symptoms of hyperglycemia   Provide causes of hyperglycemia education  Goal: Absence of Hypoglycemia  Outcome: Goal Ongoing  Flowsheets (Taken 07/31/2023 1041)  Absence of hypoglycemia:   Assess for signs and symptoms of hypoglycemia   Treat hypoglycemia symptoms per standard  Goal: Glucose level within specified parameters  Outcome: Goal Ongoing  Flowsheets (Taken 07/31/2023 1041)  Glucose level within specified parameters: Perform blood glucose monitoring as ordered and/or patient is symptomatic     Problem: Self-Care Deficit  Goal: Maximize Heart Failure Knowledge  Outcome: Goal Ongoing  Flowsheets (Taken 07/31/2023 1041)  Maximize heart failure knowledge:   Provide heart failure education on: disease process,  sign/symptoms, weight monitoring   Provide education for: low sodium diet, fluid restriction medications, activity  Goal: Maximize Heart Failure Attitudes  Outcome: Goal Ongoing  Flowsheets (Taken 07/31/2023 1041)  Maximize Heart failure Attitudes:   Provide heart failure education as needed  on: disease process, signs/symptoms,   weight monitoring,  low sodium diet, fluid restriciton, medications, activity  Goal: Maximize Heart Failure Behaviors  Outcome: Goal Ongoing  Flowsheets (Taken 07/31/2023 1041)  Maximize Heart Failure Behaviors: Provide heart failure education as needed: disease process, signs/symptoms, weight, low sodium diet, meds, etc. Problem: Aspiration, Risk of  Goal: Tolerates oral intake  Outcome: Goal Ongoing  Flowsheets (Taken 07/31/2023 1041)  Tolerates Oral Intake: Speech Therapy swallowing assessment and management  Goal: Absence of aspiration  Outcome: Goal Ongoing  Flowsheets (Taken 07/31/2023 1041)  Absence of aspiration:   Elevate head of bed   Assess for signs and symptoms of aspiration   Assess cough ability   Assess gag reflex

## 2023-07-31 NOTE — Progress Notes
 PHYSICAL THERAPY  NOTE      Name: Javier Gutierrez   MRN: [id number]     DOB: 06/03/1940      Age: 83 y.o.  Admission Date: 07/25/2023     LOS: 4 days     Date of Service: 07/31/2023      Attempted contact- patient resting at this time.     Per notes, family planning on transitioning home with hospice care tomorrow. Will continue to monitor.     Therapist: Jeoffrey Cape  Date: 07/31/2023

## 2023-07-31 NOTE — Progress Notes
 [facility] END OF SHIFT NOTE    Nursing Shift: Night Shift 1900-0700    Admission Date: 07/25/2023    Acute events, interventions, and provider communication: N/A    Patient Goal(s)  Patient will report progressive increase in activity tolerance by the end of next shift.        Patient will demonstrate improved self care by discharge.   Heart Failure Shift Summary    Admission Weight: Weight: 69.9 kg (154 lb)    Last 3 Weights:   Vitals:    07/30/23 0619 07/31/23 0500 08/01/23 0500   Weight: 69.3 kg (152 lb 12.5 oz) 69.6 kg (153 lb 7 oz) 70 kg (154 lb 5.2 oz)     Weight Change: Weight trend stable    Intake/Output Summary (Last 24 hours) at 08/01/2023 0607  Last data filed at 08/01/2023 0440  Gross per 24 hour   Intake 150 ml   Output 0 ml   Net 150 ml     Last Bowel Movement Date: 07/29/23    Fluid Restriction? No   Quality/Safety    Total Fall Risk Score: 14   Risk for Injury related to falls: Standard risk for injury based on the ABCS scoring tool  Fall Risk Category:   History of More Than One Fall Within 6 Months Before Admission: No  Elimination, Bowel and Urine: Incontinence OR urgency OR frequency  Interventions: Elimination schedule  Medications: On 2 or more high fall risk drugs  Interventions: Educate patient on medication side effects  Patient Care Equipment: One present  Interventions: Ensure environment is free of clutter and tripping hazards and Patient requires assistance while ambulating with equipment  Mobility: 2 - Unsteady gait  Interventions: Patient educated over how their mobility status places them at risk for falls, Requires additional staff to assist with mobilizing, Gait belt required while mobilizing , Patient requires staff within arm's reach while ambulating, PT/OT consulted (weakness, unsteady gait, etc.), and Patient requires assistive devices to assist with mobilization (walker, cane, etc.)  Cognition: 1 - Altered awareness of immediate physical environment  Interventions: Bed and chair alarms in place (REQUIRED), Staff remains within arm's reach while ambulating AND toileting (REQUIRED), and Patient requires lap belt in place while sitting upright and is able to demonstrate their own release    Other safety precautions in place: HAPI Prevention Bundle, Aspiration Precautions, and Delirium Protocol    Restraints: No       CBHP Patient: No   Patient Education  This RN provided education to Patient and Family today. They were engaged. and demonstrated understanding..  Quality/Safety Education:   Fall risk and Pressure injury prevention/care  Medication Education:   Medication management (Indication, adverse effects, monitoring, etc)  Education provided on the following medication(s): See eMAR  Continue to address: indications  General Education:   Bowel/Urinary elimination, Diet/nutrition, Discharge planning, and Mobility/Activity intolerance    The following teaching method(s) were used: Verbal  Follow-up should occur: daily.

## 2023-07-31 NOTE — Progress Notes
 [facility] END OF SHIFT NOTE    Nursing Shift: Day Shift 0700-1900    Admission Date: 07/25/2023    Acute events, interventions, and provider communication:   0800: Pt BP: 169/77. Doctor Danell, MD notified. Scheduled medication given (See MAR).  1120: Pt BP: 183/ 74. Doctor Danell. MD notified. PRN medication given (See MAR).     Patient Goal(s)  Patient will demonstrate improved self care by the end of next shift.        Patient will verbalize readiness for discharge by discharge.   Heart Failure Shift Summary    Admission Weight: Weight: 69.9 kg (154 lb)    Last 3 Weights:   Vitals:    07/29/23 0532 07/30/23 0619 07/31/23 0500   Weight: 70.4 kg (155 lb 3.3 oz) 69.3 kg (152 lb 12.5 oz) 69.6 kg (153 lb 7 oz)     Weight Change: Weight trend increasing    Intake/Output Summary (Last 24 hours) at 07/31/2023 1043  Last data filed at 07/31/2023 0440  Gross per 24 hour   Intake 100 ml   Output --   Net 100 ml     Last Bowel Movement Date: 07/29/23    Fluid Restriction? No   Quality/Safety    Total Fall Risk Score: 18   Risk for Injury related to falls: Surgery/procedure within the last 24 hours and Standard risk for injury based on the ABCS scoring tool  Fall Risk Category:   History of More Than One Fall Within 6 Months Before Admission: No  Elimination, Bowel and Urine: Incontinence OR urgency OR frequency  Interventions: Bedpan available in room  Medications: On 2 or more high fall risk drugs  Interventions: Educate patient on medication side effects, Bed/chair alarm in place and turned on (impulsive, forgetful, etc.), Remain within arm's reach while ambulating (orthostasis, dizziness), Remain within arm's reach while toileting (orthostasis, dizziness), and Gait belt utilized while ambulating  Patient Care Equipment: One present  Interventions: Ensure environment is free of clutter and tripping hazards, Patient requires assistance while ambulating with equipment, and Remove unused patient care equipment   Mobility: 2 - Assistance required, 2 - Unsteady gait, 2 - Visual or auditory impairment affecting mobility  Interventions: Patient educated over how their mobility status places them at risk for falls, Requires additional staff to assist with mobilizing, Gait belt required while mobilizing , Patient requires staff within arm's reach while ambulating, Patient requires staff within arm's reach while toileting, and PT/OT consulted (weakness, unsteady gait, etc.)  Cognition: 1 - Altered awareness of immediate physical environment  Interventions: Bed and chair alarms in place (REQUIRED) and Staff remains within arm's reach while ambulating AND toileting (REQUIRED)    Other safety precautions in place: HAPI Prevention Bundle and Aspiration Precautions    Restraints: No       CBHP Patient: No   Patient Education  This RN provided education to Patient and Family today. They were engaged. and verbalized understanding..  Quality/Safety Education:   Fall risk and Pain scale  Medication Education:   Medication management (Indication, adverse effects, monitoring, etc)  Education provided on the following medication(s): carvedilol   Continue to address: indications  General Education:   Discharge planning    The following teaching method(s) were used: Verbal  Follow-up should occur: as needed.

## 2023-07-31 NOTE — Care Plan
 Problem: Discharge Planning  Goal: Participation in plan of care  Outcome: Goal Ongoing  Goal: Knowledge regarding plan of care  Outcome: Goal Ongoing  Flowsheets (Taken 07/31/2023 2254)  Knowledge regarding plan of care:   Provide plan of care education   Provide procedural and treatment education   Provide fall prevention education   Provide medication management education  Goal: Prepared for discharge  Outcome: Goal Ongoing  Flowsheets (Taken 07/31/2023 2254)  Prepared for discharge:   Complete ADL ability assessment   Collaborate with multidisciplinary team for hospital discharge coordination   Provide safe use medical equipment education   Provide diet and oral health education     Problem: Skin Integrity  Goal: Skin integrity intact  Outcome: Goal Ongoing  Flowsheets (Taken 07/31/2023 2254)  Skin integrity intact:   Assess nutrition   Promote nutrition   Assure position change   Monitor skin integrity   Reduce skin shear, friction and tissue load   Provide skin care interventions   Provide incontinence management interventions  Goal: Healing of skin (Wound & Incision)  Outcome: Goal Ongoing  Flowsheets (Taken 07/31/2023 2254)  Healing of wound (wounds and Incisions): Assess for signs and symptoms of wound infection  Goal: Healing of skin (Pressure Injury)  Outcome: Goal Ongoing  Flowsheets (Taken 07/31/2023 2254)  Healing of skin (Pressure Injury):   Assess for signs and symptoms of pressure injury infection   Assess pressure injury   Provide pressure injury care education as ordered     Problem: High Fall Risk  Goal: High Fall Risk  Outcome: Goal Ongoing  Flowsheets (Taken 07/31/2023 2254)  High Fall Risk:   All patients will receive: High fall risk sign, yellow wristband, yellow socks, gait belt, and shower shoes   Engage bed alarm - middle setting   Engage chair alarm   Stay with the patient while toileting/showering   PT/OT consult for fall prevention assessment if scoring in Unsteady Gait or Visual or Auditory impairment   Remove excess equipment/supplies   Use safe patient handling equipment as appropriate     Problem: Glucose Management  Goal: Absence of hyperglycemia  Outcome: Goal Ongoing  Flowsheets (Taken 07/31/2023 2254)  Absence of hyperglycemia: Administer pharmacological therapies as ordered  Goal: Absence of Hypoglycemia  Outcome: Goal Ongoing  Goal: Glucose level within specified parameters  Outcome: Goal Ongoing     Problem: Self-Care Deficit  Goal: Maximize Heart Failure Knowledge  Outcome: Goal Ongoing  Goal: Maximize Heart Failure Attitudes  Outcome: Goal Ongoing  Goal: Maximize Heart Failure Behaviors  Outcome: Goal Ongoing     Problem: Aspiration, Risk of  Goal: Tolerates oral intake  Outcome: Goal Ongoing  Goal: Absence of aspiration  Outcome: Goal Ongoing  Flowsheets (Taken 07/31/2023 2254)  Absence of aspiration:   Assess for signs and symptoms of aspiration   Elevate head of bed

## 2023-07-31 NOTE — Progress Notes
 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)    Encounter for palliative care    Right ureteral stone    Patient is an 83 year old male with a history of COPD, heart failure with reduced ejection fraction, A-fib on anticoagulation, PAD, history of CVA with expressive aphasia, 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  Presented with right-sided flank pain, leukocytosis of 15,000, CT abdomen/pelvis demonstrated 3 mm right-sided ureteral calculus with hydroureteronephrosis.  There is mild bladder wall thickening as well as possible findings of chronic outlet obstruction felt to be cystitis.  UA showed trace leuks, packed RBCs and did not meet criteria for culture.  Urology was consulted and he underwent right-sided ureteroscopy with right-sided ureteral stent placement and will arrange for definitive stone management as an outpatient  - Patient currently not on Flomax has been unable to tolerate it (developed hypotension in the past)    Acute hypoxic respiratory failure - improved secondary to pneumonia versus volume overload versus ongoing silent aspiration  Leukocytosis, etiology secondary to infection versus reactive from nephrolithiasis and procedure  Mild volume overload  - Patient with leukocytosis of 15,000 when presented, noted to have bilateral lower lung pulmonary opacities similar on the right from May imaging and increased on the left, radiographically appeared pneumonia versus aspiration versus atelectasis.  - MRSA pneumonia screen was negative, procalcitonin was 0.1 at presentation  - Presentation was felt he had mild volume overload and heart failure was consulted and he was diuresed with IV Lasix  40 mg x 1  -Past with evidence of multifocal pneumonia, in the context of his ongoing dysphagia this most likely represents ongoing aspiration especially in light of negative procalcitonin  -Was on Doxy from 7/3 to 7/6, Completed 5 days of Zosyn   - He grew pseudomonas again on culture on 7/3. In the past pulmonary felt he may need longer IV abx for eradication and was referred to outpatient pulm  Plan:  > Family understands he is at risk for ongoing aspiration but favors to continue to the patient to eat as he is able to tolerate  > He has follow up with outpatient pulmonary on 8/7   > Acutely low suspicion that pseudomonas in sputum culture is culprit of acute hypoxic respiratory failure especially given neg procal, quick improvement in oxygenation, more likely 2/2 to aspiration  Moderate dysphagia  - Patient has been increasingly susceptible to mental status changes with acute illness, SLP attempted to evaluate on 7/5 but patient was too drowsy.  Family favored continuing pur?ed diet as he is tolerated this in the past when he has been arousable  - They understand risk of ongoing aspiration and would favor to continue to eat pureed with thickened liquids  - Do not believe patient can tolerate esophogram(due to inability to swallow thin liquids) but will underwent a video swallow that demonstrated oropharyngeal dysphagia as well as some esophageal retention  - GI consulted perform diagnostic endoscopic evaluation on 7/9 (due to to Plavix  and Eliquis  use)    Goals of Care  - Patient's had a decline in his overall functional status especially the last few months, due to hospitalizations and acute illness.  He did have some meaningful improvement after last discharge.  Daughter tells me that he was able to be evaluated by hospice at home who felt he was likely doing too well to qualify for hospice.  Per outpatient discussion with primary care provider the original plan was to try and avoid hospital and the patient again but unfortunately currently hospitalized and quite debilitated.  Daughter is somewhat perplexed/confused on how to best approach overall care for her father.  Evidently had previously said he wants to be full code but does not seem he has insight into what this would entail at this point so for now she has been trying to respect his wishes and keeping him full code but also recognizes that he is been declining.  She is hopeful for getting over the acute illness and possibly getting back to where he was prior to hospitalization but acknowledges that this may not be feasible.  - Palliative care consultation, CODE STATUS transition to DNAR limited intervention and sending referrals for home with hospice, anticipate at the time of discharge we will discharge home with hospice with the primary intention to keep the patient out of the hospital    AKI on CKD, III  - Baseline has been extremely variable but appears to be around 1.2-1.4 and has frequent spikes to the 1.6 range and occasionally does get into the 1.1 range  - Creatinine has been mildly elevated likely secondary to acute illness in the setting of prolonged decreased p.o. intake (does not eat much from 7/3 to 7/5)  - Continue to monitor    History of mild cognitive decline  History of CVA with expressive aphasia  - Baseline patient tends to be alert and oriented x 2-3 with difficulty in word finding    Hypertension  - Changed PTA metoprolol  to Coreg , increase dose to 12.5 mg twice daily  - Started amlodipine  5 mg daily on 7/8  - Continue hydralazine  and labetalol  as needed, titrating medications very cautiously and slowly as patient has been very sensitive to medication changes.  Became hypotensive even on Flomax in the past    GERD-continue PTA PPI  History of atrial fibrillation-continue PTA Eliquis , changed metoprolol  to coreg  for better BP control  Hx of COPD? 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  PT/OT recommending inpatient, however overall goal is most likely going to line with discharging home with hospice    Patient was discussed at  multidisciplinary rounds which included nurse, pharmacist, social work, and nurse case management who provided additional information that affected the patient's care.    Total Time Today was > 65 minutes minutes in the following activities: Preparing to see the patient, Obtaining and/or reviewing separately obtained history, Performing a medically appropriate examination and/or evaluation, Counseling and educating the patient/family/caregiver, Ordering medications, tests, or procedures, Documenting clinical information in the electronic or other health record, Independently interpreting results (not separately reported) and communicating results to the patient/family/caregiver and Care coordination (not separately reported)    Note to patient: The 21st Century Cures Act makes medical notes like these available to patients in the interest of transparency. However, be advised this is a medical document. It is intended as peer to peer communication. It is written in medical language and may contain abbreviations or verbiage that are unfamiliar. It may appear blunt or direct. Medical documents are intended to carry relevant information, facts as evident, and the clinical opinion of the practitioner.    Lyanne Galt, DO  Pager [phone]    __________________________________________________________________________________  Subjective: Javier Gutierrez is a 83 y.o. male     Patient appears comfortable, nods his head no to any complaints.  Daughter reports he is doing slightly better. She feels somewhat perplexed by the pseudomonas in his sputum culture, I expressed my concern tat this may be colonization rather than acute infection and prolonged abx unlikely to improve his on going risk of aspiration.     Medications:  Scheduled Meds:[Transfer Hold] albuterol -ipratropium (DUONEB) nebulizer solution 3 mL, 3 mL, Inhalation, TID & PRN  [Transfer Hold] amLODIPine  (NORVASC ) tablet 5 mg, 5 mg, Oral, QDAY  [Transfer Hold] apixaban  (ELIQUIS ) tablet 2.5 mg, 2.5 mg, Oral, BID  [Transfer Hold] arformoteroL  (BROVANA ) nebulizer solution 15 mcg, 15 mcg, Inhalation, BID  [Transfer Hold] atorvastatin  (LIPITOR) tablet 20 mg, 20 mg, Oral, QDAY  [Transfer Hold] budesonide  (PULMICORT ) nebulizer solution 0.5 mg, 0.5 mg, Inhalation, BID  carvediloL  (COREG ) tablet 12.5 mg, 12.5 mg, Oral, BID  [Transfer Hold] clopiDOGreL  (PLAVIX ) tablet 75 mg, 75 mg, Oral, QDAY  [Transfer Hold] dutasteride  (AVODART ) capsule 0.5 mg, 0.5 mg, Oral, QDAY  [Transfer Hold] levothyroxine  (SYNTHROID ) tablet 88 mcg, 88 mcg, Oral, QDAY(07)  [Transfer Hold] mirabegron  (MYRBETRIQ ) ER tablet 25 mg, 25 mg, Oral, QDAY  [Transfer Hold] montelukast  (SINGULAIR ) tablet 10 mg, 10 mg, Oral, QHS  [Transfer Hold] pantoprazole  DR (PROTONIX ) tablet 40 mg, 40 mg, Oral, QDAY  [Transfer Hold] sodium chloride  (NEBUSAL) 3 % nebulizer solution 4 mL, 4 mL, Inhalation, BID    Continuous Infusions:  PRN and Respiratory Meds:[Transfer Hold] albuterol  sulfate PRN, [Transfer Hold] benzonatate  TID PRN, [Transfer Hold] guaiFENesin   (ROBITUSSIN) oral solution Q4H PRN, hydrALAZINE  Q8H PRN, [Transfer Hold] hyoscyamine  Q4H PRN, labetalol  (NORMODYNE ; TRANDATE ) injection Q4H PRN, [Transfer Hold] melatonin QHS PRN, [Transfer Hold] polyethylene glycol 3350  QDAY PRN, [Transfer Hold] sennosides-docusate sodium  QDAY PRN      Physical Exam:  Vital Signs: Last Filed In 24 Hours Vital Signs: 24 Hour Range   BP: (P) 154/75 (07/09 1532)  Temp: 36.5 ?C (97.7 ?F) (07/09 1400)  Pulse: 70 (07/09 1538)  Respirations: 18 PER MINUTE (07/09 1538)  SpO2: 92 % (07/09 1538)  O2 Device: None (Room air) (07/09 1400) BP: (130-187)/(60-92)   Temp:  [36.5 ?C (97.7 ?F)-36.8 ?C (98.2 ?F)]   Pulse:  [67-78]   Respirations:  [16 PER MINUTE-19 PER MINUTE]   SpO2:  [91 %-100 %]   O2 Device: None (Room air)  General: Arousable, oriented x 1-2  Lungs: Coarse breath sounds bilaterally worse on the left  Chest wall:  No tenderness or deformity.  Heart:  RRR, S1, S2, no other murmurs  Extremities:  No LE edema  Neurologic: Globally weak    Lab/Radiology/Other Diagnostic Tests:  24-hour labs:    Results for orders placed or performed during the hospital encounter of 07/25/23 (from the past 24 hours)   CBC AND DIFF    Collection Time: 07/31/23  6:46 AM   Result Value Ref Range    White Blood Cells 12.50 (H) 4.50 - 11.00 10*3/uL    Red Blood Cells 4.03 (L) 4.40 - 5.50 10*6/uL    Hemoglobin 11.0 (L) 13.5 - 16.5 g/dL    Hematocrit 66.3 (L) 40.0 - 50.0 %    MCV 83.4 80.0 - 100.0 fL    MCH 27.3 26.0 - 34.0 pg    MCHC 32.7 32.0 - 36.0 g/dL    RDW 81.4 (H) 88.9 - 15.0 %    Platelet Count 285 150 - 400 10*3/uL    MPV 8.4 7.0 - 11.0 fL    Neutrophils 64.7 41.0 - 77.0 %    Lymphocytes 19.7 (L) 24.0 - 44.0 %    Monocytes 10.0 4.0 - 12.0 %    Eosinophils 4.9 0.0 - 5.0 %    Basophils 0.7 0.0 - 2.0 %    Absolute Neutrophil Count 8.10 (H) 1.80 - 7.00 10*3/uL    Absolute Lymph Count 2.50 1.00 - 4.80 10*3/uL    Absolute Monocyte Count 1.30 (H) 0.00 - 0.80 10*3/uL    Absolute Eosinophil Count 0.60 (H) 0.00 - 0.45 10*3/uL    Absolute Basophil Count 0.10 0.00 - 0.20 10*3/uL   COMPREHENSIVE METABOLIC PANEL    Collection Time: 07/31/23  6:46 AM   Result Value Ref Range    Sodium 144 137 - 147 mmol/L    Potassium 3.5 3.5 - 5.1 mmol/L    Chloride 111 (H) 98 - 110 mmol/L    Glucose 108 (H) 70 - 100 mg/dL    Blood Urea Nitrogen 28 (H) 7 - 25 mg/dL    Creatinine 8.68 (H) 0.40 - 1.24 mg/dL    Calcium 8.5 8.5 - 89.3 mg/dL    Total Protein 5.6 (L) 6.0 - 8.0 g/dL    Total Bilirubin 0.3 0.2 - 1.3 mg/dL    Albumin  2.5 (L) 3.5 - 5.0 g/dL    Alk Phosphatase 36 25 - 110 U/L    AST 9 7 - 40 U/L    ALT 5 (L) 7 - 56 U/L    CO2 28 21 - 30 mmol/L    Anion Gap 5 3 - 12    Glomerular Filtration Rate (GFR) 54 (L) >60 mL/min   MAGNESIUM     Collection Time: 07/31/23  6:46 AM   Result Value Ref Range    Magnesium  1.8 1.6 - 2.6 mg/dL     Glucose: (!) 891 (92/90/74 9353)  Pertinent radiology reviewed.    Lyanne ONEIDA Galt, DO  Pager [phone]

## 2023-08-01 ENCOUNTER — Encounter: Admit: 2023-08-01 | Discharge: 2023-08-01 | Payer: MEDICARE

## 2023-08-01 DIAGNOSIS — K219 Gastro-esophageal reflux disease without esophagitis: Secondary | ICD-10-CM

## 2023-08-01 DIAGNOSIS — I951 Orthostatic hypotension: Secondary | ICD-10-CM

## 2023-08-01 DIAGNOSIS — Z860100 Personal history of colon polyps, unspecified: Secondary | ICD-10-CM

## 2023-08-01 DIAGNOSIS — Z952 Presence of prosthetic heart valve: Secondary | ICD-10-CM

## 2023-08-01 DIAGNOSIS — T361X5A Adverse effect of cephalosporins and other beta-lactam antibiotics, initial encounter: Secondary | ICD-10-CM

## 2023-08-01 DIAGNOSIS — I5043 Acute on chronic combined systolic (congestive) and diastolic (congestive) heart failure: Secondary | ICD-10-CM

## 2023-08-01 DIAGNOSIS — K449 Diaphragmatic hernia without obstruction or gangrene: Secondary | ICD-10-CM

## 2023-08-01 DIAGNOSIS — Z515 Encounter for palliative care: Secondary | ICD-10-CM

## 2023-08-01 DIAGNOSIS — Z95828 Presence of other vascular implants and grafts: Secondary | ICD-10-CM

## 2023-08-01 DIAGNOSIS — N3289 Other specified disorders of bladder: Secondary | ICD-10-CM

## 2023-08-01 DIAGNOSIS — B965 Pseudomonas (aeruginosa) (mallei) (pseudomallei) as the cause of diseases classified elsewhere: Secondary | ICD-10-CM

## 2023-08-01 DIAGNOSIS — Z888 Allergy status to other drugs, medicaments and biological substances status: Secondary | ICD-10-CM

## 2023-08-01 DIAGNOSIS — I7143 Infrarenal abdominal aortic aneurysm, without rupture: Secondary | ICD-10-CM

## 2023-08-01 DIAGNOSIS — Z7902 Long term (current) use of antithrombotics/antiplatelets: Secondary | ICD-10-CM

## 2023-08-01 DIAGNOSIS — Z7901 Long term (current) use of anticoagulants: Secondary | ICD-10-CM

## 2023-08-01 DIAGNOSIS — I2510 Atherosclerotic heart disease of native coronary artery without angina pectoris: Secondary | ICD-10-CM

## 2023-08-01 DIAGNOSIS — Z7951 Long term (current) use of inhaled steroids: Secondary | ICD-10-CM

## 2023-08-01 DIAGNOSIS — J690 Pneumonitis due to inhalation of food and vomit: Secondary | ICD-10-CM

## 2023-08-01 DIAGNOSIS — Z823 Family history of stroke: Secondary | ICD-10-CM

## 2023-08-01 DIAGNOSIS — E039 Hypothyroidism, unspecified: Secondary | ICD-10-CM

## 2023-08-01 DIAGNOSIS — Z79890 Hormone replacement therapy (postmenopausal): Secondary | ICD-10-CM

## 2023-08-01 DIAGNOSIS — K3189 Other diseases of stomach and duodenum: Secondary | ICD-10-CM

## 2023-08-01 DIAGNOSIS — I130 Hypertensive heart and chronic kidney disease with heart failure and stage 1 through stage 4 chronic kidney disease, or unspecified chronic kidney disease: Secondary | ICD-10-CM

## 2023-08-01 DIAGNOSIS — Z79899 Other long term (current) drug therapy: Secondary | ICD-10-CM

## 2023-08-01 DIAGNOSIS — N132 Hydronephrosis with renal and ureteral calculous obstruction: Secondary | ICD-10-CM

## 2023-08-01 DIAGNOSIS — N261 Atrophy of kidney (terminal): Secondary | ICD-10-CM

## 2023-08-01 DIAGNOSIS — I69320 Aphasia following cerebral infarction: Secondary | ICD-10-CM

## 2023-08-01 DIAGNOSIS — D696 Thrombocytopenia, unspecified: Secondary | ICD-10-CM

## 2023-08-01 DIAGNOSIS — Z87442 Personal history of urinary calculi: Secondary | ICD-10-CM

## 2023-08-01 DIAGNOSIS — Z66 Do not resuscitate: Secondary | ICD-10-CM

## 2023-08-01 DIAGNOSIS — I429 Cardiomyopathy, unspecified: Secondary | ICD-10-CM

## 2023-08-01 DIAGNOSIS — I739 Peripheral vascular disease, unspecified: Secondary | ICD-10-CM

## 2023-08-01 DIAGNOSIS — J4489 Other specified chronic obstructive pulmonary disease (CMS-HCC): Secondary | ICD-10-CM

## 2023-08-01 DIAGNOSIS — I480 Paroxysmal atrial fibrillation: Secondary | ICD-10-CM

## 2023-08-01 DIAGNOSIS — J9601 Acute respiratory failure with hypoxia: Secondary | ICD-10-CM

## 2023-08-01 DIAGNOSIS — N179 Acute kidney failure, unspecified: Secondary | ICD-10-CM

## 2023-08-01 DIAGNOSIS — E785 Hyperlipidemia, unspecified: Secondary | ICD-10-CM

## 2023-08-01 DIAGNOSIS — L89151 Pressure ulcer of sacral region, stage 1: Secondary | ICD-10-CM

## 2023-08-01 DIAGNOSIS — I350 Nonrheumatic aortic (valve) stenosis: Secondary | ICD-10-CM

## 2023-08-01 DIAGNOSIS — Z8701 Personal history of pneumonia (recurrent): Secondary | ICD-10-CM

## 2023-08-01 DIAGNOSIS — J90 Pleural effusion, not elsewhere classified: Secondary | ICD-10-CM

## 2023-08-01 DIAGNOSIS — K8020 Calculus of gallbladder without cholecystitis without obstruction: Secondary | ICD-10-CM

## 2023-08-01 DIAGNOSIS — Z8249 Family history of ischemic heart disease and other diseases of the circulatory system: Secondary | ICD-10-CM

## 2023-08-01 DIAGNOSIS — Z87891 Personal history of nicotine dependence: Secondary | ICD-10-CM

## 2023-08-01 DIAGNOSIS — N183 Chronic kidney disease, stage 3 unspecified (CMS-HCC): Secondary | ICD-10-CM

## 2023-08-01 DIAGNOSIS — I6522 Occlusion and stenosis of left carotid artery: Secondary | ICD-10-CM

## 2023-08-01 LAB — MANUAL DIFF
~~LOC~~ BKR LYMPHOCYTES - RELATIVE: 14 % — ABNORMAL LOW (ref 24–44)
~~LOC~~ BKR NEUTROPHILS - RELATIVE: 73 % (ref 41–77)

## 2023-08-01 MED ORDER — BENZONATATE 100 MG PO CAP
100 mg | Freq: Three times a day (TID) | ORAL | 0 refills | 9.00000 days | Status: AC | PRN
Start: 2023-08-01 — End: ?

## 2023-08-01 MED ORDER — AMLODIPINE 5 MG PO TAB
5 mg | Freq: Every day | ORAL | 0 refills | 90.00000 days | Status: AC
Start: 2023-08-01 — End: ?

## 2023-08-01 MED ORDER — SODIUM CHLORIDE 3 % IN NEBU
4 mL | Freq: Two times a day (BID) | RESPIRATORY_TRACT | 0 refills | 30.00000 days | Status: AC
Start: 2023-08-01 — End: ?

## 2023-08-01 MED ORDER — GUAIFENESIN 100 MG/5 ML PO LIQD
5 mL | ORAL | 0 refills | 14.00000 days | Status: AC | PRN
Start: 2023-08-01 — End: ?

## 2023-08-01 MED ORDER — CARVEDILOL 12.5 MG PO TAB
12.5 mg | Freq: Two times a day (BID) | ORAL | 0 refills | 90.00000 days | Status: AC
Start: 2023-08-01 — End: ?

## 2023-08-01 NOTE — Case Management (ED)
 Case Management Progress Note    NAME:Javier Gutierrez                          MRN: [id number]              DOB:January 28, 1940          AGE: 83 y.o.  ADMISSION DATE: 07/25/2023             DAYS ADMITTED: LOS: 5 days      Today's Date: 08/01/2023    PLAN: discharge home with home hospice services.  [facility] will provide home hospice services.  Patient discharging to daughter [name]'s home at [phone], [place].  Prescriptions to be sent to [facility] in [place]  Daughter to provide transportation home.    Expected Discharge Date: 08/01/2023   Is Patient Medically Stable: Yes   Are there Barriers to Discharge? no    INTERVENTION/DISPOSITION:  Discharge Planning              Discharge Planning: Home Hospice   NCM reviewed EMR and attended huddle.   NCM faxed signed orders and AVS to [facility].   NCM notified Luke [phone] at [facility] of patient's discharge and validated that signed orders and AVS received.      Transportation              Does the Patient Need Case Management to Arrange Discharge Transport? (ex: facility, ambulance, wheelchair/stretcher, Medicaid, cab, other): No  Will the Patient Use Family Transport?: Yes  Transportation Name, Phone and Availability #1: Daughter Jayd Cadieux [phone]    Support              Support: Pt/Family Updates re:POC or DC Plan, Huddle/team update   NCM updated AVS with contact information for [facility].    Info or Referral              Information or Referral to Community Resources: No Needs Identified    Positive SDOH Domains and Potential Barriers   Positive for SDOH Domain:  (No CM SDOH needs identified.)           Medication Needs              Medication Needs: No Needs Identified                 Financial              Financial: No Needs Identified    Legal              Legal: No Needs Identified    Other              Other/None: No needs identified    Discharge Disposition                               Selected Continued Care - Admitted Since 07/25/2023       [facility] Coordination complete.      Service Provider Services Address Phone Fax Patient Preferred    [facility] [phone] [place] [address] [phone] --                  Rojelio Pizza RN, MSN, CEN  Integrated Nurse Case Manager  [phone]  Office (preferred)  Available on [place] [phone]

## 2023-08-01 NOTE — Progress Notes
 Brief Progress Note On Day Of Discharge    No acute events overnight.  Son and daughter at the bedside.  He was not very conversational but was smiling at the thought of getting cake at home today.    Family feels overall he is doing well.  Daughter inquired about a pulmonary consult, primarily for secretions as well as to discuss the Pseudomonas.  In regards to Pseudomonas eradication even if he was on IV antibiotics a) low suspicion that he would have completed our indication and b) more importantly, this would not change his underlying problem of ongoing aspiration.     He currently has an outpatient pulmonary appointment, typically patients that are on hospice do not go to these appointments.  At this point she would likely favor not canceling it but for his quality of life she is thinking about just not going to the appointment to minimize transportation.  I would support this decision as I am not sure the 2-week course of IV antibiotics would improve his quality of life at this point.    Discussed with hospice agency as well as they feel they are able to effectively manage secretions and have already ordered meds for his home, they would not cover Plavix  or Eliquis  and requested to be discontinued, discharge    VSS and labs reviewed.    Discharge medications reviewed.     > 31 minutes were spent in total discharge planning.    Lyanne Galt, DO  Pager [phone]

## 2023-08-01 NOTE — Discharge Planning (AHS/AVS)
 Home Hospice Services will be provided by [facility] upon discharge.  Please contact them at [phone] for any questions.      It has been a pleasure assisting in your care.  Take good care of yourself.    Pryor Pizza, RN, MSN, Licensed conveyancer [phone]

## 2023-08-01 NOTE — Progress Notes
 OCCUPATIONAL THERAPY  NOTE   Name: Javier Gutierrez   MRN: [id number]     DOB: 1940-09-20      Age: 83 y.o.  Admission Date: 07/25/2023     LOS: 5 days     Date of Service: 08/01/2023    Patient in bed with daughter at bedside, reporting plan for home with hospice. Daughter and patient report no concerns or needs for transition to home at this time. Patient pleasantly requesting to rest on this date, all needs met. OT will continue to monitor and check in for patient needs during admission.    Therapist: Hester Joslin, OTD, OTR/L  Date: 08/01/2023

## 2023-08-01 NOTE — Progress Notes
 Javier Gutierrez Pam discharged on 08/01/2023.   Javier Gutierrez  Discharge instructions reviewed with patient.  Valuables returned:   ADL Belongings at Bedside: Denture(s)  Denture(s): Full upper, Full lower.  Home medications:    .  Functional assessment at discharge complete: Yes .          PIV remove. Discharge instructions reviewed with patient's daughter, his primary caregiver. No questions at this time.

## 2023-08-01 NOTE — Progress Notes
 BH 62 END OF SHIFT NOTE    Nursing Shift: Other: 0700-1500    Admission Date: 07/25/2023    Acute events, interventions, and provider communication: Patient's daughter requested to talk to pulmonary about secretions and breathing status. RN attempted to reach out, but not consulted. Dr. Lyanne Galt notified of family requests- provider to address on rounds.     Patient Goal(s)  Patient will display signs of effective airway clearance by absence of dyspnea and respiratory rate within normal limits by the end of next shift.        Patient will verbalize pain has improved by discharge.   Heart Failure Shift Summary    Admission Weight: Weight: 69.9 kg (154 lb)    Last 3 Weights:   Vitals:    07/30/23 0619 07/31/23 0500 08/01/23 0500   Weight: 69.3 kg (152 lb 12.5 oz) 69.6 kg (153 lb 7 oz) 70 kg (154 lb 5.2 oz)     Weight Change: Weight trend increasing    Intake/Output Summary (Last 24 hours) at 08/01/2023 1006  Last data filed at 08/01/2023 0845  Gross per 24 hour   Intake 150 ml   Output 0 ml   Net 150 ml     Last Bowel Movement Date: 07/29/23    Fluid Restriction? No   Quality/Safety    Total Fall Risk Score: 16   Risk for Injury related to falls: Standard risk for injury based on the ABCS scoring tool  Fall Risk Category:   History of More Than One Fall Within 6 Months Before Admission: No  Elimination, Bowel and Urine: Incontinence OR urgency OR frequency  Interventions: Bedpan available in room  Medications: On 2 or more high fall risk drugs  Interventions: Educate patient on medication side effects, Increased safety rounding frequency, and Gait belt utilized while ambulating  Patient Care Equipment: One present  Interventions: Ensure environment is free of clutter and tripping hazards, Patient requires assistance while ambulating with equipment, and Remove unused patient care equipment   Mobility: 2 - Assistance required, 2 - Unsteady gait  Interventions: Patient educated over how their mobility status places them at risk for falls, Requires additional staff to assist with mobilizing, Gait belt required while mobilizing , Patient requires staff within arm's reach while ambulating, Patient requires staff within arm's reach while toileting, PT/OT consulted (weakness, unsteady gait, etc.), Patient requires assistive devices to assist with mobilization (walker, cane, etc.), and Patient wears corrective lenses to promote safe transfers (glasses, contacts)  Cognition: 1 - Altered awareness of immediate physical environment  Interventions: Bed alarm off while patient's daughter is present, as he does not try to get up and she will call us  if he wants to ambulate. She is his primary caretaker at home.     Other safety precautions in place: HAPI Prevention Bundle and Aspiration Precautions    Restraints: No       CBHP Patient: No   Patient Education  This RN provided education to Patient, Family, and Caregiver today. Patient was non-receptive, but his daughter was engaged and verbalized no questions. and had no evidence of understanding/learning. .  Quality/Safety Education:   Fall risk and Pressure injury prevention/care  Medication Education:   Medication management (Indication, adverse effects, monitoring, etc)  Education provided on the following medication(s): Amlodipine , Apixaban , Carvedilol , Clopidogrel , Dutasteride , Guaifenesin , Hydralazine , Mirabegron   Continue to address: indications  General Education:   Bowel/Urinary elimination, Diet/nutrition, Discharge planning, and Mobility/Activity intolerance    The following teaching method(s) were used:  Verbal  Follow-up should occur: daily.

## 2023-08-01 NOTE — Progress Notes
 Gastroenterology Consultation - Follow Up      HISTORY OF PRESENT ILLNESS  Javier Gutierrez is a 83 y.o. 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 GI consulted for dysphagia from esophageal origin, and consideration of diagnostic EGD (while on eliquis  and plavix ). Patient has been having swallowing difficulty has been worsening over the past few months. He initially had dysphagia in 2018 for which he got temporary PEG tube placed. He was overall doing well but has been slowly declining clinically over the past few months since his pneumonia. Per daughter, patient has had difficulty swallowing solid foods mainly but occasionally chokes/gags on liquids as well. Video swallow showed oropharyngeal component to symptoms as well as impaired esophageal clearance. Patient unable to tolerate thin liquids for an esophogram. Per primary team, eventual plans are likely to discharge on home hospice but hoping to improve quality of life/eating by evaluation of esophagus.      24 Hour Events/Subjective:   Patient reports tolerance to pureed diet/thickened liquids. EGD report reviewed with patient and family at bedside - showed some erythema in the duodenal bulb, otherwise unremarkable. Discharge planned for today.    REVIEW OF MEDICAL RECORDS  Past Medical History:    Acute on chronic systolic heart failure, NYHA class 2 (CMS-HCC)    Amyloidosis (CMS-HCC)    Aortic valve stenosis, mild    Arrhythmia    Arterial occlusion    Arthritis    Asthma    CAD (coronary artery disease)    Cardiomyopathy (CMS-HCC)    Carotid artery plaque    Cataract    CHF (congestive heart failure) (CMS-HCC)    Chronic kidney disease    Colon polyps    COPD (chronic obstructive pulmonary disease) (CMS-HCC)    Coronary atherosclerosis    Dizziness    Dyspnea    Frailty    H/O: CVA (cardiovascular accident)    History of renal artery stenosis    HTN    Hyperlipidemia    Hyperlipidemia    Hypertension    Hypothyroidism    Kidney disease    Kidney stones    Lung disease    Peripheral vascular disease    Tobacco abuse    Valvular heart disease     Surgical History:   Procedure Laterality Date    HX LUMBAR DISKECTOMY  01/22/1966    30 years ago    STENT INTRAVASCULAR  01/23/2004    kidney stent placed    Left Heart Catheterization With Ventriculogram Left 03/11/2015    Performed by Lannis Maude SAILOR, MD, [facility] at [facility] CATH LAB    Coronary Angiography N/A 03/11/2015    Performed by Lannis Maude SAILOR, MD, [facility] at [facility] CATH LAB    Possible Percutaneous Coronary Intervention N/A 03/11/2015    Performed by Lannis Maude SAILOR, MD, [facility] at [facility] CATH LAB    ESOPHAGOGASTRODUODENOSCOPY N/A 04/26/2015    Performed by Leonce Emerick PARAS, DO at [facility] ENDO    ESOPHAGOGASTRODUODENOSCOPY BIOPSY  04/26/2015    Performed by Leonce Emerick PARAS, DO at [facility] ENDO    REPLACEMENT TRANSCATHETER AORTIC VALVE (Sapien 26s3), right common femoral artery approach N/A 08/03/2015    Performed by Tony Cordella FALCON, MD at [facility] CVOR  BRONCHOSCOPY Bilateral 11/30/2016    Performed by Shereen Rigg, MD at [facility] OR    BRONCHOSCOPY WITH BRONCHIAL ALVEOLAR LAVAGE - FLEXIBLE Right 11/30/2016    Performed by Shereen Rigg, MD at [facility] OR    ESOPHAGOGASTRODUODENOSCOPY for PEG placement N/A 12/06/2016    Performed by Buckles, Toribio BROCKS, MD at [facility] ENDO    ENDARTERECTOMY CAROTID ARTERY Left 02/17/2018    Performed by Idelia Alverna BROCKS, MD at [facility] OR    CYSTOURETHROSCOPY WITH URETEROSCOPY AND/ OR PYELOSCOPY - WITH REMOVAL/ MANIPULATION CALCULUS Right 07/26/2023    Performed by Jama Sherwood POUR, MD at [facility] OR    CYSTOURETHROSCOPY WITH INDWELLING URETERAL STENT INSERTION Right 07/26/2023    Performed by Jama Sherwood POUR, MD at [facility] OR    RETROGRADE UROGRAPHY WITH/ WITHOUT KUB Right 07/26/2023    Performed by Jama Sherwood POUR, MD at [facility] OR    ANOMALOUS VENOUS RETURN REPAIR  2017    ECHOCARDIOGRAM PROCEDURE  05/2022    HX CATARACT REMOVAL  2018    HX HEART CATHETERIZATION  2015    KIDNEY STONE SURGERY  2025     Social History     Socioeconomic History    Marital status: Widowed   Tobacco Use    Smoking status: Former     Current packs/day: 0.00     Average packs/day: 1.5 packs/day for 60.0 years (90.0 ttl pk-yrs)     Types: Cigarettes     Start date: 02/13/1958     Quit date: 02/13/2018     Years since quitting: 5.4    Smokeless tobacco: Never    Tobacco comments:     0.5-2 ppd history   Vaping Use    Vaping status: Never Used   Substance and Sexual Activity    Alcohol use: Not Currently    Drug use: Never    Sexual activity: Not Currently     Partners: Female     Family History   Problem Relation Name Age of Onset    Stroke Mother Virginia  Matheson     Other Mother Virginia  [place]ock         colon cancer    Other Father          valve disease/COPD    Aortic Disease/Dissection Father      Cancer Mother Virginia  [place]ott         Colon    Heart Disease Father Revanth Neidig      Allergies   Allergen Reactions    Tamsulosin HYPOTENSION    Cefepime  SEE COMMENTS     suspect to be primary contributor to neurotoxicity     Current Facility-Administered Medications   Medication Dose Route Frequency Provider Last Rate Last Admin    albuterol  sulfate (PROAIR  HFA) inhaler 2 puff  2 puff Inhalation PRN Bailuc, Stefania L, MD        albuterol -ipratropium (DUONEB) nebulizer solution 3 mL  3 mL Inhalation TID & PRN Bailuc, Stefania L, MD   3 mL at 08/01/23 0841    amLODIPine  (NORVASC ) tablet 5 mg  5 mg Oral QDAY Nadella, Shravan T, DO   5 mg at 08/01/23 0846    apixaban  (ELIQUIS ) tablet 2.5 mg  2.5 mg Oral BID Nadella, Shravan T, DO   2.5 mg at 08/01/23 0846    arformoteroL  (BROVANA ) nebulizer solution 15 mcg  15 mcg Inhalation BID Bailuc, Stefania L, MD   15 mcg at 08/01/23 0842    atorvastatin  (LIPITOR) tablet 20 mg 20 mg Oral  QDAY Bailuc, Stefania L, MD   20 mg at 07/31/23 2112    benzonatate  (TESSALON  PERLES) capsule 100 mg  100 mg Oral TID PRN Nadella, Shravan T, DO        budesonide  (PULMICORT ) nebulizer solution 0.5 mg  0.5 mg Inhalation BID Bailuc, Stefania L, MD   0.5 mg at 08/01/23 0841    carvediloL  (COREG ) tablet 12.5 mg  12.5 mg Oral BID Nadella, Shravan T, DO   12.5 mg at 08/01/23 0846    clopiDOGreL  (PLAVIX ) tablet 75 mg  75 mg Oral QDAY Nadella, Shravan T, DO   75 mg at 08/01/23 0846    dutasteride  (AVODART ) capsule 0.5 mg  0.5 mg Oral QDAY Bailuc, Stefania L, MD   0.5 mg at 08/01/23 0846    guaiFENesin  (ROBITUSSIN) oral solution 200 mg  200 mg Oral Q4H PRN Mohamad Alahmad, Mohamad Alhoda, MD   200 mg at 08/01/23 0858    hydrALAZINE  (APRESOLINE ) tablet 25 mg  25 mg Oral Q8H PRN Mohamad Alahmad, Mohamad Alhoda, MD   25 mg at 08/01/23 0846    hyoscyamine  (ANASPAZ ) rapid dissolve tablet 0.125 mg  0.125 mg Sublingual Q4H PRN Valadon, Crystal, MD        labetaloL  (NORMODYNE ) injection 20 mg  20 mg Intravenous Q4H PRN Mohamad Alahmad, Mohamad Alhoda, MD   20 mg at 07/30/23 1533    levothyroxine  (SYNTHROID ) tablet 88 mcg  88 mcg Oral QDAY(07) Bailuc, Stefania L, MD   88 mcg at 08/01/23 0638    melatonin tablet 5 mg  5 mg Oral QHS PRN Bailuc, Stefania L, MD        mirabegron  (MYRBETRIQ ) ER tablet 25 mg  25 mg Oral QDAY Bailuc, Stefania L, MD   25 mg at 08/01/23 0847    montelukast  (SINGULAIR ) tablet 10 mg  10 mg Oral QHS Bailuc, Stefania L, MD   10 mg at 07/31/23 2112    pantoprazole  DR (PROTONIX ) tablet 40 mg  40 mg Oral QDAY Bailuc, Stefania L, MD   40 mg at 07/31/23 2112    polyethylene glycol 3350  (MIRALAX ) packet 17 g  1 packet Oral QDAY PRN Bailuc, Stefania L, MD        sennosides-docusate sodium  (SENOKOT-S) tablet 1 tablet  1 tablet Oral QDAY PRN Bailuc, Stefania L, MD   1 tablet at 07/28/23 0916    sodium chloride  (NEBUSAL) 3 % nebulizer solution 4 mL  4 mL Inhalation BID Nadella, Shravan T, DO   4 mL at 08/01/23 0847     REVIEW OF SYSTEMS  HEENT: No bleeding gums   Respiratory: No wheezing   Gastrointestinal: See HPI above.     PHYSICAL EXAMINATION  Vital Signs: BP (!) 171/96 (BP Source: Arm, Left Upper) Comment: Maddie RN Notified - Pulse 65  - Temp 36.5 ?C (97.7 ?F)  - Ht 177.8 cm (5' 10) Comment: reported - Wt 70 kg (154 lb 5.2 oz)  - SpO2 92%  - BMI 22.14 kg/m?   Body mass index is 22.14 kg/m?SABRA  Constitutional: In no acute distress   Eyes: Normal conjunctivae   Ears, Nose, Throat, and Mouth: Hard of hearing and appropriate speech  Respiratory: Non-labored breathing  Cardiovascular: Normal heart rate   Gastrointestinal: Abdomen soft, non-distended, and non-tender; +BS; +healed site of prior G tube  Neurologic: Awake and alert, tired appearing  Psychiatric: Calm     RECENT LABS: Pertinent labs reviewed.    RADIOLOGY:    Video swallow (07/30/2023)  Silent aspiration with  thin liquid. The patient has difficulty swallowing   on command.  No laryngeal penetration or aspiration with mildly thickened   or moderately thickened barium.   Please see the separately dictated report from the Department of Speech   Pathology for additional description.     CT AP w contrast (07/25/2023)  1.  3 mm right mid ureteral calculus, with minimal upstream   hydroureteronephrosis.   2. Additional nonobstructing bilateral renal calculi. No left   hydronephrosis.   3. Mild bladder wall thickening, which may be secondary to chronic outlet   obstruction; though cystitis could appear similar. Correlation with   urinalysis is recommended.   4.  Trace pleural effusions. Adjacent patchy opacities are likely   atelectasis, though infection and/or aspiration could contribute.   5.  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   6.  Cholelithiasis.     GI PROCEDURES:    EGD (07/31/2023)  - Normal esophagus.   - Esophagogastric landmarks identified.   - Normal stomach.   - Small hiatal hernia.   - Erythematous duodenopathy in duodenal bulb.   - Normal second portion of the duodenum.   - No specimens collected.     ASSESSMENT AND PLAN  Dysphagia  History of CVA with known oropharyngeal dysphagia     Recommendations:  - Diet per SLP/RD recommendations   - Consider once a day PPI if remaining on eliquis  and plavix  to reduce risk of bleeding     Total Time Today was 35 minutes in the following activities: Preparing to see the patient, Obtaining and/or reviewing separately obtained history, Performing a medically appropriate examination and/or evaluation, Counseling and educating the patient/family/caregiver, Documenting clinical information in the electronic or other health record, Independently interpreting results (not separately reported) and communicating results to the patient/family/caregiver, and Care coordination (not separately reported)     Patient discussed and plan agreed upon with Dr. Verlon. Thank you for this consultation. It has been a pleasure to be a part of the care team. Please feel free to contact the GI consult team with any questions or concerns.    Nat Rush MSN, APRN, FNP-C   [facility]  Division of Gastroenterology  Available via [place] when on service, otherwise please contact GI fellow

## 2023-08-01 NOTE — Care Plan
 Problem: Discharge Planning  Goal: Participation in plan of care  Outcome: Goal Achieved  Goal: Knowledge regarding plan of care  Outcome: Goal Achieved  Goal: Prepared for discharge  Outcome: Goal Achieved  Flowsheets (Taken 08/01/2023 1302)  Prepared for discharge:   Provide discharge activity restrictions education   Provide discharge materials appropriate to patient condition     Problem: Skin Integrity  Goal: Skin integrity intact  Outcome: Goal Achieved  Goal: Healing of skin (Wound & Incision)  Outcome: Goal Achieved  Goal: Healing of skin (Pressure Injury)  Outcome: Goal Achieved     Problem: High Fall Risk  Goal: High Fall Risk  Outcome: Goal Achieved     Problem: Glucose Management  Goal: Absence of hyperglycemia  Outcome: Goal Achieved  Goal: Absence of Hypoglycemia  Outcome: Goal Achieved  Goal: Glucose level within specified parameters  Outcome: Goal Achieved     Problem: Self-Care Deficit  Goal: Maximize Heart Failure Knowledge  Outcome: Goal Achieved  Goal: Maximize Heart Failure Attitudes  Outcome: Goal Achieved  Goal: Maximize Heart Failure Behaviors  Outcome: Goal Achieved     Problem: Aspiration, Risk of  Goal: Tolerates oral intake  Outcome: Goal Achieved  Goal: Absence of aspiration  Outcome: Goal Achieved

## 2023-08-02 ENCOUNTER — Encounter: Admit: 2023-08-02 | Discharge: 2023-08-02 | Payer: MEDICARE

## 2023-08-02 NOTE — Discharge Instructions - Pharmacy
 Discharge Summary      Name: Javier Gutierrez  Medical Record Number: [id number]        Account Number:  [id number]  Date Of Birth:  [birth year]                         Age:  83 y.o.  Admit date:  07/25/2023                     Discharge date:  08/01/2023 12:50 PM       Discharge Attending:  Lyanne Galt, DO  Discharge Summary Completed By: Lyanne ONEIDA Galt, DO    Service: Med Private [phone]    Reason for hospitalization:  Abdominal pain [R10.9]    Primary Discharge Diagnosis:   Abdominal pain    Hospital Diagnoses:  Hospital Problems        Active Problems    * (Principal) Abdominal pain    Cardiomyopathy (CMS-HCC)    History of renal artery stenosis    Hypertension    Stage 3 chronic kidney disease (CMS-HCC)    Acute respiratory failure with hypoxia (CMS-HCC)    Orthostatic hypotension    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)    Encounter for palliative care    Right ureteral stone    Oropharyngeal dysphagia     Present on Admission:   Abdominal pain   Cardiomyopathy (CMS-HCC)   History of renal artery stenosis   Stage 3 chronic kidney disease (CMS-HCC)   Hypertension   Orthostatic hypotension   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)   Acute respiratory failure with hypoxia (CMS-HCC)        Significant Past Medical History        Acute on chronic systolic heart failure, NYHA class 2 (CMS-HCC)  Amyloidosis (CMS-HCC)  Aortic valve stenosis, mild  Arrhythmia      Comment:  new onset afib-sched for cardioversion 05/082024  Arterial occlusion      Comment:  left kidney-two stents placed  Arthritis      Comment:  lower back  Asthma  CAD (coronary artery disease)  Cardiomyopathy (CMS-HCC)  Carotid artery plaque  Cataract      Comment:  Surgery 2018  CHF (congestive heart failure) (CMS-HCC)  Chronic kidney disease  Colon polyps  COPD (chronic obstructive pulmonary disease) (CMS-HCC)  Coronary atherosclerosis  Dizziness  Dyspnea  Frailty  H/O: CVA (cardiovascular accident)  History of renal artery stenosis  HTN  Hyperlipidemia  Hyperlipidemia  Hypertension  Hypothyroidism  Kidney disease  Kidney stones  Lung disease  Peripheral vascular disease  Tobacco abuse  Valvular heart disease    Allergies   Tamsulosin and Cefepime     Brief Hospital Course   The patient was admitted and the following issues were addressed during this hospitalization: (with pertinent details including admission exam/imaging/labs).      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  Presented with right-sided flank pain, leukocytosis of 15,000, CT abdomen/pelvis demonstrated 3 mm right-sided ureteral calculus with hydroureteronephrosis.  There is mild bladder wall thickening as well as possible findings of chronic outlet obstruction felt to be cystitis.  UA showed trace leuks, packed RBCs and did not meet criteria for culture.  Urology was consulted and he underwent right-sided ureteroscopy with right-sided ureteral  stent placement and will arrange for definitive stone management as an outpatient. He was unable tolerate flomax due to hypotension.     Acute hypoxic respiratory failure - improved secondary to pneumonia versus volume overload versus ongoing silent aspiration  Leukocytosis, etiology secondary to infection versus reactive from nephrolithiasis and procedure  Mild volume overload  - Patient with leukocytosis of 15,000 when presented, noted to have bilateral lower lung pulmonary opacities similar on the right from May imaging and increased on the left, radiographically appeared pneumonia versus aspiration versus atelectasis.SABRA MRSA pneumonia screen was negative, procalcitonin was 0.1 at presentation. Presentation was felt he had mild volume overload and heart failure was consulted and he was diuresed with IV Lasix  40 mg x 1. Past with evidence of multifocal pneumonia, in the context of his ongoing dysphagia this most likely represents ongoing aspiration especially in light of negative procalcitonin. Was on Doxy from 7/3 to 7/6, Completed 5 days of Zosyn . He grew pseudomonas again on culture on 7/3. In the past pulmonary felt he may need longer IV abx for eradication and was referred to outpatient pulm, that is planned as outpatient on 8/7. 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 Given his overall goals, did not believe attempted decolonization with prolonged antibiotics would provide improvement in quality of life.    Moderate dysphagia  - Patient has been increasingly susceptible to mental status changes with acute illness, SLP attempted to evaluate on 7/5 but patient was too drowsy.  Family favored continuing pur?ed diet as he is tolerated this in the past when he has been arousable  - They understand risk of ongoing aspiration and would favor to continue to eat pureed with thickened liquids. Do not believe patient can tolerate esophogram(due to inability to swallow thin liquids) but will underwent a video swallow that demonstrated oropharyngeal dysphagia as well as some esophageal retention. GI was consulted and he underwent a diagnostic EGD which showed a normal esophagus.       Given his underlying co morbidities, on going aspiration, after an extensive goals of care discussions, he was discharged home on hospice.         Items Needing Follow Up   Pending items or areas that need to be addressed at follow up: none    Pending Labs and Follow Up Radiology    Pending labs and/or radiology review at this time of discharge are listed below: Please note- any labs with collected status will not have a result; if this area is blank, there are no items for review.         Medications      Medication List      START taking these medications     amLODIPine  5 mg tablet; Commonly known as: NORVASC ; Dose: 5 mg; Take one   tablet by mouth daily. Indications: high blood pressure; For: high blood   pressure; Refills: 0   benzonatate  100 mg capsule; Commonly known as: TESSALON  PERLES; Dose:   100 mg; Take one capsule by mouth three times daily as needed for Cough.   Indications: cough; For: cough; Refills: 0   carvediloL  12.5 mg tablet; Commonly known as: COREG ; Dose: 12.5 mg; Take   one tablet by mouth twice daily. Take with food.  Indications: high blood   pressure; For: high blood pressure; Refills: 0   guaiFENesin  100 mg/5 mL oral solution; Commonly known as: ROBITUSSIN;   Dose: 5 mL; Take 5 mL by mouth every 4 hours as  needed. Indications:   cough; For: cough; Refills: 0   sodium chloride  3 % nebulizer solution; Commonly known as: NEBUSAL;   Dose: 4 mL; Inhale 4 mL by mouth into the lungs twice daily. Indications:   secretion clearance; For: secretion clearance; Refills: 0     CONTINUE taking these medications     acetaminophen  500 mg tablet; Commonly known as: TYLENOL  EXTRA STRENGTH;   Dose: 500 mg; Refills: 0   albuterol -ipratropium 0.5 mg-3 mg(2.5 mg base)/3 mL nebulizer solution;   Commonly known as: DUONEB; Dose: 1 vial; Refills: 0   atorvastatin  20 mg tablet; Commonly known as: LIPITOR; Dose: 20 mg; Take   one tablet by mouth daily.; Quantity: 90 tablet; Refills: 3   budesonide  0.5 mg/2 mL nebulizer solution; Commonly known as: PULMICORT ;   Dose: 2 mL; Refills: 0   dutasteride  0.5 mg capsule; Commonly known as: AVODART ; Dose: 0.5 mg;   Refills: 0   food supplemt, lactose-reduced 0.05 gram- 1.5 kcal/mL oral liquid;   Commonly known as: ENSURE PLUS; Dose: 1 Can; Refills: 0   formoterol fumarate 20 mcg/2 mL nebulizer vial; Commonly known as:   PERFOROMIST; Dose: 20 mcg; Refills: 0   levothyroxine  88 mcg tablet; Commonly known as: SYNTHROID ; Dose: 88 mcg;   Refills: 0   montelukast  10 mg tablet; Commonly known as: SINGULAIR ; Dose: 10 mg;   Take one tablet by mouth at bedtime daily.; Quantity: 30 tablet; Refills:   1   MYRBETRIQ  25 mg ER tablet; Generic drug: mirabegron ; Dose: 25 mg;   Refills: 0   pantoprazole  DR 40 mg tablet; Commonly known as: PROTONIX ; Dose: 40 mg;   Take one tablet by mouth daily.; Quantity: 30 tablet; Refills: 1     STOP taking these medications     clopiDOGreL  75 mg tablet; Commonly known as: PLAVIX    ELIQUIS  2.5 mg tablet; Generic drug: apixaban    levoFLOXacin  500 mg tablet; Commonly known as: LEVAQUIN    metoprolol  succinate XL 25 mg extended release tablet; Commonly known   as: TOPROL  XL       Return Appointments and Scheduled Appointments     Scheduled appointments:      Aug 14, 2023 4:00 PM  (Arrive by 3:45 PM)  US  RENAL BLADDER COMPLETE with [facility] 1 - [facility]  Imaging, Ultrasound: [facility] ([facility]) [address] [place] [address]  [phone]     Aug 29, 2023 9:30 AM  Testing with PF LAB SCHEDULE A  Pulmonary Function Lab: [facility] (--) [address]  [address], [address]  [place]  [place] [address]  [phone]     Aug 29, 2023 10:45 AM  (Arrive by 10:30 AM)  CT CHEST W/O CONTRAST with CT-[facility]  Imaging, CT: [facility]) [address]  [address], [address]  [place]  [place] [address]  [phone]     Aug 29, 2023 11:00 AM  Office visit with Dorn MARLA Herring, DO  Pulmonology: [facility] (Internal Medicine) [address]  [address]  [place]  [place] [address]  [phone]     Oct 03, 2023 1:15 PM  Office visit with Alm DELENA Dec, MD  Urology: [facility] (Urology) [address]  [address], [address]  [place]  [place] [address]  [phone]     Oct 15, 2023 10:00 AM  Office visit with Marina  LOISE Darter, MD  Cardiovascular Medicine: [facility] (CVM Exam) [address]  [address], [address]  [place] [address]  [phone]          Things you need to do       Follow up with Kyung Goodell, MD    Phone: [phone]    Where: [address] [place] [address]          Contact information for after-discharge care                [facility]       [facility]    Phone: [phone]    Fax: [phone]    Where: [address], [address], [place] [address]    Service: Home Hospice                  Consults, Procedures, Diagnostics, Micro, Pathology   Consults: Cardiology, GI, Palliative Care, and Urology  Surgical Procedures & Dates: as noted in hospital course  Significant Diagnostic Studies, Micro and Procedures: noted in brief hospital course  Significant Pathology: noted in brief hospital course                       Discharge Disposition, Condition   Patient Disposition: Hospice/Home [50]  Condition at Discharge: Terminal, comfort measures    Code Status   Prior    Patient Instructions     Activity       Activity as Tolerated   As directed      It is important to keep increasing your activity level after you leave the hospital.  Moving around can help prevent blood clots, lung infection (pneumonia) and other problems.  Gradually increasing the number of times you are up moving around will help you return to your normal activity level more quickly.  Continue to increase the number of times you are up to the chair and walking daily to return to your normal activity level. Begin to work toward your normal activity level after follow-up appointment          Diet       Regular Diet   As directed      You have no dietary restriction. Please continue with a healthy balanced diet.             Discharge education provided to patient.    Additional Orders: Case Management, Supplies, Home Health     Home Health/DME                HOME HEALTH/DME  ONCE        Comments: Home Health/Durable Medical Equipment Order Details    Patient Name:  Javier Gutierrez                   Medical Record Number:   [id number]      Patient Height: 177.8 cm (5' 10) (reported)   Patient Weight: 69.6 kg (153 lb 7 oz)        Agency Instructions: [facility]     Patient requires home hospice for discharge on 08/01/23. Admit to home hospice with diagnosis Chronic Heart Failure ICD 10: I50.9 and Cardiomyopathy ICD 10: I42.9 and a terminal prognosis. RN to evaluate and treat for end of life care to include maintaining comfort for patient. Monitor and teach: hospital avoidance, signs and symptoms of discomfort, concerns related to the dying process, and who/when to call for assistance in home. RN to continue to evaluate for DME and supplies for home care.      Swallow  Precautions:  Comments: Medications: As tolerated  Supervision: 1:1(family OK to supervise  Positioning: Upright as tolerated  Swallow Strategies: Slow rate of intake, Small bites/sips, Multiple swallows, Alternate liquids/solids, 100% supervision to provide cues  for swallow strategies during meals  Oral Hygiene: Complete oral care to minimize the risk of aspirating oral bacteria       I certify that this patient is under my care and that I, or a nurse practitioner or physician's assistant working with me, had a face-to-face encounter that meets the physician's face-to-face encounter requirements with this patient on 08/01/23.     This patient is under my care, and I have initiated the establishment of the plan of care. This patient will be followed by a physician after discharge, who will periodically review the plan of care.     Clinical findings to support homebound status: Chair bound or bedbound, Unsteady gait, Ambulates short distances only, Poor tolerance for activity, and Extreme weakness and/or fatigue  Clinical findings to support home care services: Deficits in medical management, Dysphagia, Muscle weakness affecting functional activities, Balance deficits with risk for falling, Deficit in ADL's, and Safety deficits    Dr. Lyanne Halter  NPI [phone]   Question Answer Comment   Attending Name/Contact Halter Lyanne [phone]  Fax [phone]    PCP Name/Contact Kyung Goodell [phone]  Fax [phone] Signed:  Lyanne ONEIDA Galt, DO  08/02/2023      cc:  Primary Care Physician:  Kyung Goodell   Verified    Referring physicians:  No Pcp, Na   Additional provider(s):        Did we miss something? If additional records are needed, please fax a request on office letterhead to [phone]. Please include the patient's name, date of birth, fax number and type of information needed. Additional request can be made by email at [email or web address]. For general questions of information about electronic records sharing, call [phone].

## 2023-08-03 ENCOUNTER — Encounter: Admit: 2023-08-03 | Discharge: 2023-08-03 | Payer: MEDICARE

## 2023-08-29 ENCOUNTER — Encounter: Admit: 2023-08-29 | Discharge: 2023-08-29 | Payer: MEDICARE

## 2023-09-13 ENCOUNTER — Encounter: Admit: 2023-09-13 | Discharge: 2023-09-13 | Payer: MEDICARE

## 2023-09-25 ENCOUNTER — Encounter: Admit: 2023-09-25 | Discharge: 2023-09-25 | Payer: MEDICARE

## 2023-09-25 NOTE — Telephone Encounter
 Received call back from patient's daughter. She confirmed that patient was discharged on hospice. She reports that he is doing okay from a urologic standpoint. He does have gross hematuria and is passing blood clots, but is not experiencing difficulty with urination. He does have a ureteral stent in place and we reviewed that gross hematuria is common with a ureteral stent. Future appointments cancelled. Informed patient's daughter that we are happy to help if any additional questions or concerns arise. She v/u; no further questions.

## 2023-09-30 ENCOUNTER — Encounter: Admit: 2023-09-30 | Discharge: 2023-09-30 | Payer: MEDICARE

## 2024-01-31 ENCOUNTER — Encounter: Admit: 2024-01-31 | Discharge: 2024-01-31 | Payer: MEDICARE
